# Patient Record
Sex: Male | Born: 1955 | Race: Black or African American | Hispanic: No | Marital: Single | Smoking: Never smoker
Health system: Southern US, Community
[De-identification: ages and names within clinical notes are randomized; demographics above are authoritative.]

## PROBLEM LIST (undated history)

## (undated) DIAGNOSIS — I499 Cardiac arrhythmia, unspecified: Secondary | ICD-10-CM

## (undated) DIAGNOSIS — I4892 Unspecified atrial flutter: Secondary | ICD-10-CM

## (undated) DIAGNOSIS — I519 Heart disease, unspecified: Secondary | ICD-10-CM

## (undated) DIAGNOSIS — I509 Heart failure, unspecified: Secondary | ICD-10-CM

## (undated) DIAGNOSIS — L03119 Cellulitis of unspecified part of limb: Secondary | ICD-10-CM

## (undated) DIAGNOSIS — I2699 Other pulmonary embolism without acute cor pulmonale: Secondary | ICD-10-CM

## (undated) DIAGNOSIS — I10 Essential (primary) hypertension: Secondary | ICD-10-CM

## (undated) DIAGNOSIS — F1010 Alcohol abuse, uncomplicated: Secondary | ICD-10-CM

## (undated) HISTORY — PX: TONSILLECTOMY: SUR1361

## (undated) HISTORY — DX: Heart disease, unspecified: I51.9

## (undated) HISTORY — DX: Unspecified atrial flutter: I48.92

## (undated) HISTORY — DX: Heart failure, unspecified: I50.9

## (undated) HISTORY — DX: Essential (primary) hypertension: I10

## (undated) HISTORY — DX: Other pulmonary embolism without acute cor pulmonale: I26.99

## (undated) HISTORY — DX: Alcohol abuse, uncomplicated: F10.10

## (undated) HISTORY — DX: Cellulitis of unspecified part of limb: L03.119

## (undated) HISTORY — PX: OTHER SURGICAL HISTORY: SHX169

---

## 2009-02-16 DIAGNOSIS — I2699 Other pulmonary embolism without acute cor pulmonale: Secondary | ICD-10-CM

## 2009-02-16 HISTORY — DX: Other pulmonary embolism without acute cor pulmonale: I26.99

## 2009-08-20 ENCOUNTER — Encounter (INDEPENDENT_AMBULATORY_CARE_PROVIDER_SITE_OTHER): Payer: Self-pay | Admitting: *Deleted

## 2009-08-20 ENCOUNTER — Inpatient Hospital Stay (HOSPITAL_COMMUNITY)
Admission: EM | Admit: 2009-08-20 | Discharge: 2009-08-27 | Payer: Self-pay | Source: Home / Self Care | Admitting: Emergency Medicine

## 2009-08-20 ENCOUNTER — Ambulatory Visit: Payer: Self-pay | Admitting: Cardiovascular Disease

## 2009-08-22 ENCOUNTER — Encounter (INDEPENDENT_AMBULATORY_CARE_PROVIDER_SITE_OTHER): Payer: Self-pay | Admitting: Internal Medicine

## 2009-08-26 ENCOUNTER — Encounter: Payer: Self-pay | Admitting: Cardiology

## 2009-08-26 ENCOUNTER — Encounter (INDEPENDENT_AMBULATORY_CARE_PROVIDER_SITE_OTHER): Payer: Self-pay | Admitting: *Deleted

## 2009-08-26 LAB — CONVERTED CEMR LAB
BUN: 9 mg/dL
CO2: 37 meq/L
Calcium: 8.8 mg/dL
Creatinine, Ser: 1.24 mg/dL
Eosinophils Absolute: 0.2 10*3/uL
GFR calc non Af Amer: >60 mL/min
Glucose, Bld: 124 mg/dL
Lymphocytes Relative: 40 %
Lymphs Abs: 1.6 10*3/uL
MCHC: 32.3 g/dL
Monocytes Absolute: 0.5 10*3/uL
Monocytes Relative: 11 %
Neutro Abs: 1.8 10*3/uL
Platelets: 176 10*3/uL
Potassium: 4.6 meq/L
Prothrombin Time: 22.3 s
RBC: 4.36 M/uL
Sodium: 139 meq/L

## 2009-09-04 ENCOUNTER — Ambulatory Visit: Payer: Self-pay | Admitting: Cardiology

## 2009-09-04 LAB — CONVERTED CEMR LAB: POC INR: 4

## 2009-09-11 ENCOUNTER — Ambulatory Visit: Payer: Self-pay | Admitting: Cardiology

## 2009-09-11 LAB — CONVERTED CEMR LAB: POC INR: 3.1

## 2009-09-17 ENCOUNTER — Encounter (INDEPENDENT_AMBULATORY_CARE_PROVIDER_SITE_OTHER): Payer: Self-pay | Admitting: *Deleted

## 2009-09-18 ENCOUNTER — Ambulatory Visit: Payer: Self-pay | Admitting: Cardiology

## 2009-09-27 ENCOUNTER — Ambulatory Visit: Payer: Self-pay | Admitting: Cardiology

## 2009-09-27 ENCOUNTER — Encounter (INDEPENDENT_AMBULATORY_CARE_PROVIDER_SITE_OTHER): Payer: Self-pay | Admitting: *Deleted

## 2009-09-27 DIAGNOSIS — I2699 Other pulmonary embolism without acute cor pulmonale: Secondary | ICD-10-CM

## 2009-09-27 DIAGNOSIS — F1010 Alcohol abuse, uncomplicated: Secondary | ICD-10-CM

## 2009-09-27 DIAGNOSIS — F1021 Alcohol dependence, in remission: Secondary | ICD-10-CM | POA: Insufficient documentation

## 2009-09-27 DIAGNOSIS — I4892 Unspecified atrial flutter: Secondary | ICD-10-CM | POA: Insufficient documentation

## 2009-10-29 ENCOUNTER — Encounter: Payer: Self-pay | Admitting: Cardiology

## 2009-10-29 ENCOUNTER — Encounter (INDEPENDENT_AMBULATORY_CARE_PROVIDER_SITE_OTHER): Payer: Self-pay | Admitting: *Deleted

## 2009-10-29 LAB — CONVERTED CEMR LAB
BUN: 20 mg/dL
Basophils Absolute: 0 10*3/uL
Basophils Absolute: 0 10*3/uL (ref 0.0–0.1)
Basophils Relative: 1 %
Basophils Relative: 1 % (ref 0–1)
CO2: 30 meq/L
Calcium: 9.8 mg/dL (ref 8.4–10.5)
Chloride: 100 meq/L
Chloride: 100 meq/L (ref 96–112)
Creatinine, Ser: 1.16 mg/dL (ref 0.40–1.50)
Eosinophils Absolute: 0.2 10*3/uL
Eosinophils Absolute: 0.2 10*3/uL (ref 0.0–0.7)
Eosinophils Relative: 2 % (ref 0–5)
GFR calc non Af Amer: 9.8 mL/min
Glucose, Bld: 97 mg/dL
HCT: 41.5 % (ref 39.0–52.0)
Hemoglobin: 14.2 g/dL (ref 13.0–17.0)
Lymphocytes Relative: 31 % (ref 12–46)
Lymphs Abs: 2.1 10*3/uL
MCHC: 34.2 g/dL
MCHC: 34.2 g/dL (ref 30.0–36.0)
Monocytes Absolute: 0.5 10*3/uL (ref 0.1–1.0)
Neutro Abs: 3.9 10*3/uL (ref 1.7–7.7)
Neutrophils Relative %: 58 % (ref 43–77)
Platelets: 214 10*3/uL (ref 150–400)
Potassium: 4 meq/L
Potassium: 4 meq/L (ref 3.5–5.3)
Prothrombin Time: 31.7 s
Prothrombin Time: 31.7 s — ABNORMAL HIGH (ref 11.6–15.2)
RDW: 13.9 % (ref 11.5–15.5)
aPTT: 42 s

## 2009-10-30 ENCOUNTER — Encounter (INDEPENDENT_AMBULATORY_CARE_PROVIDER_SITE_OTHER): Payer: Self-pay | Admitting: *Deleted

## 2009-10-31 ENCOUNTER — Ambulatory Visit (HOSPITAL_COMMUNITY)
Admission: RE | Admit: 2009-10-31 | Discharge: 2009-10-31 | Payer: Self-pay | Source: Home / Self Care | Admitting: Cardiology

## 2009-11-01 ENCOUNTER — Encounter: Payer: Self-pay | Admitting: Cardiology

## 2009-11-26 ENCOUNTER — Encounter: Payer: Self-pay | Admitting: Cardiology

## 2009-11-28 ENCOUNTER — Encounter (INDEPENDENT_AMBULATORY_CARE_PROVIDER_SITE_OTHER): Payer: Self-pay | Admitting: *Deleted

## 2009-12-06 ENCOUNTER — Encounter (INDEPENDENT_AMBULATORY_CARE_PROVIDER_SITE_OTHER): Payer: Self-pay | Admitting: *Deleted

## 2009-12-17 ENCOUNTER — Ambulatory Visit: Payer: Self-pay | Admitting: Cardiology

## 2009-12-17 ENCOUNTER — Encounter (INDEPENDENT_AMBULATORY_CARE_PROVIDER_SITE_OTHER): Payer: Self-pay | Admitting: *Deleted

## 2009-12-17 DIAGNOSIS — I509 Heart failure, unspecified: Secondary | ICD-10-CM | POA: Insufficient documentation

## 2009-12-18 ENCOUNTER — Encounter: Payer: Self-pay | Admitting: Cardiology

## 2010-01-27 ENCOUNTER — Telehealth (INDEPENDENT_AMBULATORY_CARE_PROVIDER_SITE_OTHER): Payer: Self-pay | Admitting: *Deleted

## 2010-03-18 NOTE — Letter (Signed)
Summary: Cardioversion/TEE Catering manager at [facility]  [address] [place] [address]   Phone: [phone]  Fax: [phone]    Cardioversion / TEE Cardioversion Instructions  10/29/2009 MRN: [id number]  Dennis Swanson [address] [place], [place]  [address]  Dear [name], You are scheduled for a Cardioversion / TEE Cardioversion on October 31, 2009 with Dr. Dietrich Pates.   Please arrive at the [facility] at 8:30 a.m.  on the day of your procedure.  1)   DIET:  A)   Nothing to eat or drink after midnight except your medications with a sip of water.     3)   MAKE SURE YOU TAKE YOUR COUMADIN.  4)   A)   DO NOT TAKE these medications before your procedure:      ___________________________________________________________________     ___________________________________________________________________     ___________________________________________________________________  B)   YOU MAY TAKE ALL of your remaining medications with a small amount of water.    C)   START NEW medications:       ___________________________________________________________________     ___________________________________________________________________  5)  Must have a responsible person to drive you home.  6)   Bring a current list of your medications and current insurance cards.   * Special Note:  Every effort is made to have your procedure done on time. Occasionally there are emergencies that present themselves at the hospital that may cause delays. Please be patient if a delay does occur.  * If you have any questions after you get home, please call the office at [phone].

## 2010-03-18 NOTE — Miscellaneous (Signed)
Summary: LABS TSH 08/20/2009  Clinical Lists Changes  Observations: Added new observation of TSH: 1.940 microintl units/mL (08/20/2009 15:57)

## 2010-03-18 NOTE — Medication Information (Signed)
Summary: ccn  Anticoagulant Therapy  Managed by: Vashti Hey, RN Supervising MD: Daleen Squibb MD, Maisie Fus Indication 1: Atrial Flutter Indication 2: Pulmonary Embolism Lab Used: [facility]  Site: [place] INR POC 4.0  Dietary changes: no    Health status changes: no    Bleeding/hemorrhagic complications: no    Recent/future hospitalizations: yes       Details: In [facility] 08/20/09 - 08/27/09  Atrial flutter / acute PE  Any changes in medication regimen? yes       Details: started on coumadin 7.5mg  qd   D/C INR 2.22 on 7/12  Recent/future dental: no  Any missed doses?: no       Is patient compliant with meds? yes      Comments: Coumadin teaching discussed with pt.  Explained risks/benefits of med., potential drug/food interactions and bleeding precautions.  Pt verbalized understanding.  H/O ETOH abuse.  Denies ETOH intake since d/c from hospital.  Question of non-compliance per D/C summary.  Anticoagulation Management History:      The patient comes in today for his initial visit for anticoagulation therapy.  The indication for anticoagulation is Atrial Flitter / acute PE.  Negative risk factors for bleeding include an age less than 18 years old, no history of CVA/TIA, no history of GI bleeding, and absence of serious comorbidities.  The bleeding index is 'low risk'.  Positive CHADS2 values include History of CHF and History of HTN.  Negative CHADS2 values include Age > 28 years old, History of Diabetes, and Prior Stroke/CVA/TIA.  The start date was 08/20/2009.  Anticoagulation responsible provider: Daleen Squibb MD, Maisie Fus.  INR POC: 4.0.  Cuvette Lot#: [id number].    Anticoagulation Management Assessment/Plan:      The target INR is 2.0-3.0.  The next INR is due 09/11/2009.  Anticoagulation instructions were given to patient.  Results were reviewed/authorized by Vashti Hey, RN.  He was notified by Vashti Hey RN.         Current Anticoagulation Instructions: INR 4.0 Hold coumadin  tonight then decrease dose to 7.5mg  once daily except 3.75mg  on Mondays and Thursdays

## 2010-03-18 NOTE — Letter (Signed)
Summary: CARDIOVERSION ORDERS  CARDIOVERSION ORDERS   Imported By: Faythe Ghee 11/26/2009 10:29:10  _____________________________________________________________________  External Attachment:    Type:   Image     Comment:   External Document

## 2010-03-18 NOTE — Letter (Signed)
Summary: Appointment - Missed  [facility]     [place], [place]    Phone:   Fax:      October 30, 2009 MRN: [id number]   Dennis Swanson [address] [place], [place]  [address]   Dear Dennis Swanson,  Our records indicate you missed your appointment on             10/30/09 COUMADIN CLINIC AND NURSE VISIT                 It is very important that we reach you to reschedule this appointment. We look forward to participating in your health care needs. Please contact us at the number listed above at your earliest convenience to reschedule this appointment.     Sincerely,    Glass blower/designer

## 2010-03-18 NOTE — Medication Information (Signed)
Summary: ccr-lr  Anticoagulant Therapy  Managed by: Vashti Hey, RN Supervising MD: Dietrich Pates MD, Molly Maduro Indication 1: Atrial Flutter Indication 2: Pulmonary Embolism Lab Used: [facility] Point of Care [place] Site: [place] INR POC 3.1  Dietary changes: no    Health status changes: no    Bleeding/hemorrhagic complications: no    Recent/future hospitalizations: no    Any changes in medication regimen? no    Recent/future dental: no  Any missed doses?: no       Is patient compliant with meds? yes       Anticoagulation Management History:      The patient is taking warfarin and comes in today for a routine follow up visit.  He is being anticoagulated due to Atrial Flitter / acute PE.  Negative risk factors for bleeding include an age less than 35 years old, no history of CVA/TIA, no history of GI bleeding, and absence of serious comorbidities.  The bleeding index is 'low risk'.  Positive CHADS2 values include History of CHF and History of HTN.  Negative CHADS2 values include Age > 11 years old, History of Diabetes, and Prior Stroke/CVA/TIA.  The start date was 08/20/2009.  Anticoagulation responsible provider: Dietrich Pates MD, Molly Maduro.  INR POC: 3.1.  Cuvette Lot#: [id number].    Anticoagulation Management Assessment/Plan:      The target INR is 2.0-3.0.  The next INR is due 09/18/2009.  Anticoagulation instructions were given to patient.  Results were reviewed/authorized by Vashti Hey, RN.  He was notified by Vashti Hey RN.         Prior Anticoagulation Instructions: INR 4.0 Hold coumadin tonight then decrease dose to 7.5mg  once daily except 3.75mg  on Mondays and Thursdays  Current Anticoagulation Instructions: INR 3.1 Continue coumadin 7.5mg  once daily except 3.75mg  on Mondays and Thursdays Has appt with Dr Eden Emms 09/25/09

## 2010-03-18 NOTE — Letter (Signed)
Summary: [facility]   Imported By: Marylou Mccoy 09/10/2009 11:43:36  _____________________________________________________________________  External Attachment:    Type:   Image     Comment:   External Document

## 2010-03-18 NOTE — Letter (Signed)
Summary: [place] Future Lab Work Engineer, agricultural at [employer]  [address], [place] [address]   Phone: [phone]  Fax: [phone]     December 17, 2009 MRN: [id number]   Dennis Swanson [address] [place], [place]  [address]      YOUR LAB WORK IS DUE   January 07, 2010  Please go to [facility], located across the street from [facility] on the second floor.  Hours are Monday - Friday 7am until 7:30pm         Saturday 8am until 12noon    _  __ YOUR LABWORK IS NOT FASTING --YOU MAY EAT PRIOR TO LABWORK

## 2010-03-18 NOTE — Miscellaneous (Signed)
Summary: labs cbcd,bmp,pt,ptt,10/29/2009  Clinical Lists Changes  Observations: Added new observation of GFR: 9.8 mL/min (10/29/2009 10:26) Added new observation of CREATININE: 1.16 mg/dL (29/52/8413 24:40) Added new observation of BUN: 20 mg/dL (12/13/2534 64:40) Added new observation of BG RANDOM: 97 mg/dL (34/74/2595 63:87) Added new observation of CO2 PLSM/SER: 30 meq/L (10/29/2009 10:26) Added new observation of CL SERUM: 100 meq/L (10/29/2009 10:26) Added new observation of K SERUM: 4.0 meq/L (10/29/2009 10:26) Added new observation of NA: 138 meq/L (10/29/2009 10:26) Added new observation of INR: 3.06  (10/29/2009 10:26) Added new observation of PT PATIENT: 31.7 s (10/29/2009 10:26) Added new observation of PTT PATIENT: 42 s (10/29/2009 10:26) Added new observation of ABSOLUTE BAS: 0.0 K/uL (10/29/2009 10:26) Added new observation of BASOPHIL %: 1 % (10/29/2009 10:26) Added new observation of EOS ABSLT: 0.2 K/uL (10/29/2009 10:26) Added new observation of % EOS AUTO: 2 % (10/29/2009 10:26) Added new observation of ABSOLUTE MON: 0.5 K/uL (10/29/2009 10:26) Added new observation of MONOCYTE %: 8 % (10/29/2009 10:26) Added new observation of ABS LYMPHOCY: 2.1 K/uL (10/29/2009 10:26) Added new observation of LYMPHS %: 31 % (10/29/2009 10:26) Added new observation of PLATELETK/UL: 214 K/uL (10/29/2009 10:26) Added new observation of RDW: 13.9 % (10/29/2009 10:26) Added new observation of MCHC RBC: 34.2 g/dL (56/43/3295 18:84) Added new observation of MCV: 88.1 fL (10/29/2009 10:26) Added new observation of HCT: 41.5 % (10/29/2009 10:26) Added new observation of HGB: 14.2 g/dL (16/60/6301 60:10) Added new observation of RBC M/UL: 4.71 M/uL (10/29/2009 10:26) Added new observation of WBC COUNT: 6.6 10*3/microliter (10/29/2009 10:26)

## 2010-03-18 NOTE — Letter (Signed)
Summary: TEE Instructions [place]  [facility]  [address], [place] [address]   Phone: [phone]  Fax: [phone]      TEE Instructions    You are scheduled for a CARDIOVERSION on October 29, 2009 with Dr. Dietrich Pates.  Please arrive at the [facility] of [facility] at 8:00 a.m. on the day of your procedure.  1)   Diet:     A)   Nothing to eat or drink after midnight except your medications with        a sip of water.     2)  Must have a responsible person to drive you home.  3)   Bring your current insurance cards and current list of all your medications.   *Special Note:  Every effort is made to have your procedure done on time.  Occasionally there are emergencies that present themselves at the hospital that may cause delays.  Please be patient if a delay does occur.  *If you have any questions after you get home, please call the office at [phone].

## 2010-03-18 NOTE — Letter (Signed)
Summary: Appointment - Missed  [facility]  [address] [place] [address]   Phone: [phone]  Fax: [phone]     November 28, 2009 MRN: [id number]   BARNES FLOREK [address] [place], [place]  [address]   Dear Mr. NIPP,  Our records indicate you missed your appointment on         11/28/09               with Dr.     Dietrich Pates  .                                    It is very important that we reach you to reschedule this appointment. We look forward to participating in your health care needs. Please contact us at the number listed above at your earliest convenience to reschedule this appointment.     Sincerely,    Glass blower/designer

## 2010-03-18 NOTE — Assessment & Plan Note (Signed)
Summary: EPH RESCEDULED PER RR 09/23/2009/SN   Visit Type:  Follow-up Primary Provider:  None   History of Present Illness: [name] returns to the office after a recent admission to [facility] for newly diagnosed atrial flutter accompanied by congestive heart failure and mildly impaired left ventricular systolic function.  He responded well to diuretic therapy with weight decreasing from 122 to 110 kg.  Since discharge, he has been asymptomatic with no exertional dyspnea, orthopnea, PND, lightheadedness, syncope or chest pain.  Current Medications (verified): 1)  Metoprolol Succinate 100 Mg Xr24h-Tab (Metoprolol Succinate) .... Take 1/2 Tablet By Mouth Qd  X 3 Days Then Take 1 Tablet By Mouth Once Daily 2)  Diltiazem Hcl Er Beads 180 Mg Xr24h-Cap (Diltiazem Hcl Er Beads) .... Take 1 Tab Daily 3)  Furosemide 40 Mg Tabs (Furosemide) .... Take 1 Tablet By Mouth Once Daily 4)  Potassium Chloride Crys Cr 20 Meq Cr-Tabs (Potassium Chloride Crys Cr) .... Take 1 Tablet By Mouth Once Daily 5)  Warfarin Sodium 7.5 Mg Tabs (Warfarin Sodium) .... Take  As Directed  Allergies (verified): No Known Drug Allergies  Past History:  Past Medical History: Last updated: 09/27/2009 Atrial flutter;  CHF with mild systolic dysfunction; moderate TR + MR; mod. to marked LAE; no significant PHT Acute pulmonary embolus-7/11; anticoagulation Hypertension Alcohol abuse Bilateral lower extremity cellulitis     Past Surgical History: Last updated: 09/27/2009 None  Family History: Last updated: September 21, 2009 Mother:deceased due to myocardial infarction in her 73's Siblings:1 brother with diabetes and hypertension  Social History: Last updated: 09/27/2009 Tobacco Use - No.  Alcohol Use - yes-patient will not quantify Regular Exercise - no Drug Use - no Employment-unemployed; previously yard work and Psychologist, clinical alone; 2 daughters  PMH, FH, and Social History reviewed  and updated.  EKG  Procedure date:  09/27/2009  Findings:      Atrial flutter with 4:1 AV conduction Left anterior fascicular block Right ventricular conduction delay No previous tracing for comparison.   Social History: Tobacco Use - No.  Alcohol Use - yes-patient will not quantify Regular Exercise - no Drug Use - no Employment-unemployed; previously yard work and Psychologist, clinical alone; 2 daughters  Review of Systems       See history of present illness.  Vital Signs:  Patient profile:   55 year old male Height:      72 inches Weight:      255 pounds BMI:     34.71 Pulse rate:   72 / minute BP sitting:   130 / 87  (right arm)  Vitals Entered By: Dreama Saa, CNA (September 27, 2009 1:00 PM)  Physical Exam  General:  Overweight; well developed; no acute distress: Weight-255 (116 kg), 12 pounds more than when discharged from hospital   Neck-No JVD; no carotid bruits: Lungs-No tachypnea, no rales; no rhonchi; no wheezes: Cardiovascular-normal PMI; normal S1 and S2: Abdomen-BS normal; soft and non-tender without masses or organomegaly:  Musculoskeletal-No deformities, no cyanosis or clubbing: Neurologic-Normal cranial nerves; symmetric strength and tone:  Skin-Warm, no significant lesions: Extremities-Nl distal pulses; trace edema:     Impression & Recommendations:  Problem # 1:  ATRIAL FLUTTER (ICD-427.32) Presence of atrial flutter could be related to hypertension, pulmonary embolism or alcohol abuse.  Cardioversion may result in restoration of durable sinus rhythm.  Procedure was explained to patient including the risks of sedation.  He agrees to proceed, and we will do so as soon as therapeutic anticoagulation  has been maintained for 3 weeks.  Problem # 2:  PULMONARY EMBOLISM (ICD-415.19) This was an unprovoked event as far as hospital records indicate, and will require long-term anticoagulation.  Problem # 3:  ANTICOAGULATION  (ICD-V58.61) Stool for Hemoccult testing and CBCs will be monitored.  Problem # 4:  HYPERTENSION (ICD-401.1) Blood pressure control is excellent; current medications will be continued.  Problem # 5:  ALCOHOL ABUSE (ICD-305.00) Patient is not convinced that his consumption of alcohol is excessive.  He attributes his DUI arrests to animosity between the arresting officers and him.  I encouraged him to let me know if and when he does consider alcohol to be a problem for him.  Return visit-2 months.  Other Orders: Cardioversion (Cardioversion) Future Orders: T-Basic Metabolic Panel [id number]) ... 10/28/2009 T-CBC w/Diff ([id number]) ... 10/28/2009 T-Protime, Auto ([id number]) ... 10/28/2009 T-PTT ([id number]) ... 10/28/2009  Patient Instructions: 1)  Your physician recommends that you schedule a follow-up appointment in: 2 months 2)  Your physician recommends that you return for lab work in: 1 month 3)  Your physician has recommended you make the following change in your medication: Change Coreg (carvedilol) to Metoprolol 100mg   (take 1/2 tablet everyday for 3 days then take 1 tablet by mouth once daily), stop taking Digoxin, decrease Lasix (furosemide) to 40mg  once daily and Potassium to 40mg  once daily  Prescriptions: METOPROLOL SUCCINATE 100 MG XR24H-TAB (METOPROLOL SUCCINATE) take 1/2 tablet by mouth qd  X 3 days then take 1 tablet by mouth once daily  #30 x 3   Entered by:   Larita Fife Via LPN   Authorized by:   Kathlen Brunswick, MD, [facility]   Signed by:   Larita Fife Via LPN on 44/02/270   Method used:   Electronically to        [facility] (retail)       [address]       [place], [place]  [address]       Ph: [phone]       Fax: [phone]   RxID:   [id number]

## 2010-03-18 NOTE — Letter (Signed)
Summary: [place] Results Engineer, agricultural at [facility]  [address], [place] [address]   Phone: [phone]  Fax: [phone]      November 01, 2009 MRN: [id number]   KNOLAN SIMIEN [address] [place], [place]  [address]   Dear Mr. SWIATEK,  Your test ordered by Selena Batten has been reviewed by your physician (or physician assistant) and was found to be normal or stable. Your physician (or physician assistant) felt no changes were needed at this time.  ____ Echocardiogram  ____ Cardiac Stress Test  __X__ Lab Work  ____ Peripheral vascular study of arms, legs or neck  ____ CT scan or X-ray  ____ Lung or Breathing test  ____ Other:  Please continue on current medical treatment.  Thank you.   Neilton Bing, MD, F.A.C.C

## 2010-03-18 NOTE — Miscellaneous (Signed)
Summary: hospital labs 08/26/2009  Clinical Lists Changes  Observations: Added new observation of CALCIUM: 8.8 mg/dL (02/58/5277 8:24) Added new observation of GFR AA: >60 mL/min/1.28m2 (08/26/2009 8:18) Added new observation of GFR: >60 mL/min (08/26/2009 8:18) Added new observation of CREATININE: 1.24 mg/dL (23/53/6144 3:15) Added new observation of BUN: 9 mg/dL (40/09/6759 9:50) Added new observation of BG RANDOM: 124 mg/dL (93/26/7124 5:80) Added new observation of CO2 PLSM/SER: 37 meq/L (08/26/2009 8:18) Added new observation of CL SERUM: 96 meq/L (08/26/2009 8:18) Added new observation of K SERUM: 4.6 meq/L (08/26/2009 8:18) Added new observation of NA: 139 meq/L (08/26/2009 8:18) Added new observation of INR: 1.92  (08/26/2009 8:18) Added new observation of PT PATIENT: 22.3 s (08/26/2009 8:18) Added new observation of ABSOLUTE BAS: 0.0 K/uL (08/26/2009 8:18) Added new observation of BASOPHIL %: 1 % (08/26/2009 8:18) Added new observation of EOS ABSLT: 0.2 K/uL (08/26/2009 8:18) Added new observation of % EOS AUTO: 0.2 % (08/26/2009 8:18) Added new observation of ABSOLUTE MON: 0.5 K/uL (08/26/2009 8:18) Added new observation of MONOCYTE %: 11 % (08/26/2009 8:18) Added new observation of ABS LYMPHOCY: 1.6 K/uL (08/26/2009 8:18) Added new observation of LYMPHS %: 40 % (08/26/2009 8:18) Added new observation of ABS NEUTROPH: 1.8 K/uL (08/26/2009 8:18) Added new observation of PLATELETK/UL: 176 K/uL (08/26/2009 8:18) Added new observation of RDW: 14.3 % (08/26/2009 8:18) Added new observation of MCHC RBC: 32.3 g/dL (99/83/3825 0:53) Added new observation of MCV: 95.2 fL (08/26/2009 8:18) Added new observation of HCT: 41.1 % (08/26/2009 8:18) Added new observation of HGB: 13.4 g/dL (97/67/3419 3:79) Added new observation of RBC M/UL: 4.36 M/uL (08/26/2009 8:18) Added new observation of WBC COUNT: 4.1 10*3/microliter (08/26/2009 8:18)

## 2010-03-18 NOTE — Medication Information (Signed)
Summary: ccr-lr  Anticoagulant Therapy  Managed by: Vashti Hey, RN Supervising MD: Diona Browner MD, Remi Deter Indication 1: Atrial Flutter Indication 2: Pulmonary Embolism Lab Used: [facility] Point of Care [place] Site: [place] INR POC 1.8  Dietary changes: no    Health status changes: no    Bleeding/hemorrhagic complications: no    Recent/future hospitalizations: no    Any changes in medication regimen? no    Recent/future dental: no  Any missed doses?: yes     Details: missed 1 dose last night.  Ran out of med  Getting refilled today  Is patient compliant with meds? yes       Allergies: No Known Drug Allergies  Anticoagulation Management History:      The patient is taking warfarin and comes in today for a routine follow up visit.  He is being anticoagulated due to Atrial Flitter / acute PE.  Negative risk factors for bleeding include an age less than 36 years old, no history of CVA/TIA, no history of GI bleeding, and absence of serious comorbidities.  The bleeding index is 'low risk'.  Positive CHADS2 values include History of CHF and History of HTN.  Negative CHADS2 values include Age > 26 years old, History of Diabetes, and Prior Stroke/CVA/TIA.  The start date was 08/20/2009.  Anticoagulation responsible provider: Diona Browner MD, Remi Deter.  INR POC: 1.8.  Cuvette Lot#: [id number].    Anticoagulation Management Assessment/Plan:      The patient's current anticoagulation dose is Warfarin sodium 7.5 mg tabs: take  as directed.  The target INR is 2.0-3.0.  The next INR is due 10/07/2009.  Anticoagulation instructions were given to patient.  Results were reviewed/authorized by Vashti Hey, RN.  He was notified by Vashti Hey RN.         Prior Anticoagulation Instructions: INR 3.1 Continue coumadin 7.5mg  once daily except 3.75mg  on Mondays and Thursdays Has appt with Dr Eden Emms 09/25/09  Current Anticoagulation Instructions: INR 1.8 Take coumadin 1 1/2 tablets tonight then resume 7.5mg   once daily except 3.75mg  on Mondays and Thursdays

## 2010-03-18 NOTE — Assessment & Plan Note (Signed)
Summary: PAST DUE FOR F/U/TG   Visit Type:  Follow-up Primary Provider:  None   History of Present Illness: [name] returns to the office for continuing assessment and treatment of atrial flutter associated with congestive heart failure and mildly impaired left ventricular systolic function.  Since his last visit, he has done quite well.  He denies orthopnea, PND, exertional dyspnea, chest discomfort, palpitations, lightheadedness and syncope.  Cardioversion was planned, but never carried out as the result of the patient withdrawing consent.  After discussion with another physician, he was concerned about possible adverse effects.  EKG  Procedure date:  12/17/2009  Findings:      Rhythm Strip  Normal sinus rhythm at a rate of 64 bpm   Current Medications (verified): 1)  Metoprolol Succinate 100 Mg Xr24h-Tab (Metoprolol Succinate) .... Take 1 Tablet By Mouth Once A Day 2)  Diltiazem Hcl Er Beads 180 Mg Xr24h-Cap (Diltiazem Hcl Er Beads) .... Take 1 Tab Daily 3)  Furosemide 40 Mg Tabs (Furosemide) .... Take 1/2 Tablet By Mouth Daily 4)  Potassium Chloride Crys Cr 20 Meq Cr-Tabs (Potassium Chloride Crys Cr) .... Take 1 Tablet By Mouth Once Daily 5)  Warfarin Sodium 7.5 Mg Tabs (Warfarin Sodium) .... Take  As Directed  Allergies (verified): No Known Drug Allergies  Comments:  Nurse/Medical Assistant: patient reviewed previous med list from previous ov and stated that all meds are correct  Past History:  PMH, FH, and Social History reviewed and updated.  Review of Systems       See history of present illness.  Vital Signs:  Patient profile:   55 year old male Weight:      257 pounds BMI:     34.98 Pulse rate:   62 / minute BP sitting:   141 / 91  (right arm)  Vitals Entered By: Dreama Saa, CNA (December 17, 2009 1:49 PM)  Physical Exam  General:  Overweight; well developed; no acute distress: Weight-255 (116 kg), 12 pounds more than when discharged  from hospital   Neck-No JVD; no carotid bruits: Lungs-No tachypnea, no rales; no rhonchi; no wheezes: Cardiovascular-normal PMI; normal S1 and S2; minimal systolic murmur Abdomen-BS normal; soft and non-tender without masses or organomegaly:  Musculoskeletal-No deformities, no cyanosis or clubbing: Neurologic-Normal cranial nerves; symmetric strength and tone:  Skin-Warm, no significant lesions: Extremities-Nl distal pulses;1/2+ edema of the right ankle; minimal on the left   Impression & Recommendations:  Problem # 1:  ATRIAL FLUTTER (ICD-427.32) Patient appears to make the correct decision regarding cardioversion, as he has converted spontaneously to sinus rhythm.  Reversion to atrial flutter appears likely since there was no precipitant for his first episode.  Current medication will be continued.  Problem # 2:  CHF (ICD-428.0) CHF may be more related to patient's rhythm then his intrinsic cardiac function.  His dose of furosemide will be decreased to 20 mg q.d. with continuing monitoring of electrolytes and renal function.  Problem # 3:  ANTICOAGULATION (ICD-V58.61) Stool for Hemoccult testing will be obtained.  CBC will be monitored to exclude significant blood loss while the patient is anticoagulated.  Other Orders: Hemoccult Cards (Take Home) (Hemoccult Cards) Future Orders: T-BNP  (B Natriuretic Peptide) ([id number]) ... 01/07/2010 T-CBC w/Diff ([id number]) ... 01/07/2010 T-Comprehensive Metabolic Panel [id number]) ... 01/07/2010  Patient Instructions: 1)  Your physician recommends that you schedule a follow-up appointment in: 6 months 2)  Your physician recommends that you return for lab work in:  3 weeks 3)  Your  physician has asked that you test your stool for blood. It is necessary to test 3 different stool specimens for accuracy. You will be given 3 hemoccult cards for specimen collection. For each stool specimen, place a small portion of stool sample (from 2  different areas of the stool) into the 2 squares on the card. Close card. Repeat with 2 more stool specimens. Bring the cards back to the office for testing. 4)  INCREASE ACTIVITY

## 2010-03-20 NOTE — Progress Notes (Signed)
  Phone Note Other Incoming   Request: Send information Summary of Call: Request for records received from [facility] and [employer]. Request forwarded to [facility].

## 2010-03-28 ENCOUNTER — Encounter (INDEPENDENT_AMBULATORY_CARE_PROVIDER_SITE_OTHER): Payer: Self-pay | Admitting: Pharmacist

## 2010-04-03 NOTE — Letter (Signed)
Summary: Custom - Delinquent Coumadin 1  [facility], Main Office  [address]   [place], [place] [address]   Phone: [phone]  Fax: [phone]     March 28, 2010 MRN: [id number]   Dennis Swanson [address] [place], [place]  [address]   Dear Dennis Swanson,  This letter is being sent to you as a reminder that it is necessary for you to get your INR/PT checked regularly so that we can optimize your care.  Our records indicate that you were scheduled to have a test done recently.  As of today, we have not received the results of this test.  It is very important that you have your INR checked.  Please call our office at the number listed above to schedule an appointment at your earliest convenience.    If you have recently had your protime checked or have discontinued this medication, please contact our office at the above phone number to clarify this issue.  Thank you for this prompt attention to this important health care matter.  Sincerely,   [facility] Team

## 2010-04-30 ENCOUNTER — Encounter: Payer: Self-pay | Admitting: Pharmacist

## 2010-05-04 LAB — CBC
HCT: 38.5 % — ABNORMAL LOW (ref 39.0–52.0)
HCT: 38.7 % — ABNORMAL LOW (ref 39.0–52.0)
Hemoglobin: 12.9 g/dL — ABNORMAL LOW (ref 13.0–17.0)
Hemoglobin: 12.9 g/dL — ABNORMAL LOW (ref 13.0–17.0)
MCH: 30.8 pg (ref 26.0–34.0)
MCH: 31.2 pg (ref 26.0–34.0)
MCH: 31.4 pg (ref 26.0–34.0)
MCHC: 32.3 g/dL (ref 30.0–36.0)
MCHC: 32.5 g/dL (ref 30.0–36.0)
MCHC: 32.6 g/dL (ref 30.0–36.0)
MCHC: 32.7 g/dL (ref 30.0–36.0)
MCHC: 33.3 g/dL (ref 30.0–36.0)
MCV: 94.8 fL (ref 78.0–100.0)
MCV: 95.3 fL (ref 78.0–100.0)
Platelets: 152 10*3/uL (ref 150–400)
Platelets: 164 10*3/uL (ref 150–400)
Platelets: 166 10*3/uL (ref 150–400)
Platelets: 176 10*3/uL (ref 150–400)
Platelets: 181 10*3/uL (ref 150–400)
RBC: 4.1 MIL/uL — ABNORMAL LOW (ref 4.22–5.81)
RBC: 4.35 MIL/uL (ref 4.22–5.81)
RDW: 14.4 % (ref 11.5–15.5)
RDW: 14.5 % (ref 11.5–15.5)
RDW: 14.6 % (ref 11.5–15.5)
RDW: 14.7 % (ref 11.5–15.5)
RDW: 15 % (ref 11.5–15.5)
WBC: 3.7 10*3/uL — ABNORMAL LOW (ref 4.0–10.5)
WBC: 3.8 10*3/uL — ABNORMAL LOW (ref 4.0–10.5)
WBC: 4.3 10*3/uL (ref 4.0–10.5)
WBC: 5.1 10*3/uL (ref 4.0–10.5)

## 2010-05-04 LAB — DIFFERENTIAL
Basophils Absolute: 0 10*3/uL (ref 0.0–0.1)
Basophils Absolute: 0 10*3/uL (ref 0.0–0.1)
Basophils Absolute: 0 10*3/uL (ref 0.0–0.1)
Basophils Absolute: 0.1 10*3/uL (ref 0.0–0.1)
Basophils Relative: 1 % (ref 0–1)
Basophils Relative: 1 % (ref 0–1)
Basophils Relative: 1 % (ref 0–1)
Basophils Relative: 1 % (ref 0–1)
Eosinophils Absolute: 0.1 10*3/uL (ref 0.0–0.7)
Eosinophils Absolute: 0.2 10*3/uL (ref 0.0–0.7)
Eosinophils Absolute: 0.2 10*3/uL (ref 0.0–0.7)
Eosinophils Relative: 7 % — ABNORMAL HIGH (ref 0–5)
Lymphocytes Relative: 15 % (ref 12–46)
Lymphocytes Relative: 21 % (ref 12–46)
Lymphs Abs: 0.8 10*3/uL (ref 0.7–4.0)
Lymphs Abs: 1.5 10*3/uL (ref 0.7–4.0)
Lymphs Abs: 1.6 10*3/uL (ref 0.7–4.0)
Monocytes Absolute: 0.8 10*3/uL (ref 0.1–1.0)
Monocytes Absolute: 0.8 10*3/uL (ref 0.1–1.0)
Monocytes Absolute: 0.8 10*3/uL (ref 0.1–1.0)
Monocytes Relative: 11 % (ref 3–12)
Monocytes Relative: 17 % — ABNORMAL HIGH (ref 3–12)
Neutro Abs: 1.5 10*3/uL — ABNORMAL LOW (ref 1.7–7.7)
Neutro Abs: 1.9 10*3/uL (ref 1.7–7.7)
Neutro Abs: 2.7 10*3/uL (ref 1.7–7.7)
Neutrophils Relative %: 31 % — ABNORMAL LOW (ref 43–77)
Neutrophils Relative %: 36 % — ABNORMAL LOW (ref 43–77)
Neutrophils Relative %: 43 % (ref 43–77)
Neutrophils Relative %: 60 % (ref 43–77)

## 2010-05-04 LAB — POCT CARDIAC MARKERS
CKMB, poc: 1.6 ng/mL (ref 1.0–8.0)
Troponin i, poc: <0.05 ng/mL (ref 0.00–0.09)

## 2010-05-04 LAB — BRAIN NATRIURETIC PEPTIDE
Pro B Natriuretic peptide (BNP): 243 pg/mL — ABNORMAL HIGH (ref 0.0–100.0)
Pro B Natriuretic peptide (BNP): 273 pg/mL — ABNORMAL HIGH (ref 0.0–100.0)
Pro B Natriuretic peptide (BNP): 436 pg/mL — ABNORMAL HIGH (ref 0.0–100.0)

## 2010-05-04 LAB — BASIC METABOLIC PANEL
BUN: 10 mg/dL (ref 6–23)
BUN: 6 mg/dL (ref 6–23)
CO2: 29 mEq/L (ref 19–32)
CO2: 37 mEq/L — ABNORMAL HIGH (ref 19–32)
Calcium: 8.5 mg/dL (ref 8.4–10.5)
Calcium: 8.7 mg/dL (ref 8.4–10.5)
Calcium: 8.8 mg/dL (ref 8.4–10.5)
Calcium: 8.8 mg/dL (ref 8.4–10.5)
Creatinine, Ser: 1.04 mg/dL (ref 0.4–1.5)
Creatinine, Ser: 1.21 mg/dL (ref 0.4–1.5)
Creatinine, Ser: 1.24 mg/dL (ref 0.4–1.5)
Creatinine, Ser: 1.28 mg/dL (ref 0.4–1.5)
GFR calc Af Amer: >60 mL/min (ref >60)
GFR calc Af Amer: >60 mL/min (ref >60)
GFR calc non Af Amer: 51 mL/min — ABNORMAL LOW (ref >60)
GFR calc non Af Amer: 58 mL/min — ABNORMAL LOW (ref >60)
GFR calc non Af Amer: >60 mL/min (ref >60)
GFR calc non Af Amer: >60 mL/min (ref >60)
GFR calc non Af Amer: >60 mL/min (ref >60)
Glucose, Bld: 106 mg/dL — ABNORMAL HIGH (ref 70–99)
Glucose, Bld: 109 mg/dL — ABNORMAL HIGH (ref 70–99)
Glucose, Bld: 114 mg/dL — ABNORMAL HIGH (ref 70–99)
Glucose, Bld: 118 mg/dL — ABNORMAL HIGH (ref 70–99)
Potassium: 3.8 mEq/L (ref 3.5–5.1)
Sodium: 140 mEq/L (ref 135–145)
Sodium: 140 mEq/L (ref 135–145)

## 2010-05-04 LAB — HEPATIC FUNCTION PANEL
ALT: 40 U/L (ref 0–53)
AST: 33 U/L (ref 0–37)
Bilirubin, Direct: 0.2 mg/dL (ref 0.0–0.3)
Total Protein: 6.4 g/dL (ref 6.0–8.3)

## 2010-05-04 LAB — PROTIME-INR
INR: 1.18 (ref 0.00–1.49)
INR: 1.2 (ref 0.00–1.49)
INR: 1.92 — ABNORMAL HIGH (ref 0.00–1.49)
INR: 2.03 — ABNORMAL HIGH (ref 0.00–1.49)
INR: 2.09 — ABNORMAL HIGH (ref 0.00–1.49)
Prothrombin Time: 15.5 seconds — ABNORMAL HIGH (ref 11.6–15.2)
Prothrombin Time: 18.1 seconds — ABNORMAL HIGH (ref 11.6–15.2)
Prothrombin Time: 22.3 seconds — ABNORMAL HIGH (ref 11.6–15.2)
Prothrombin Time: 23.3 seconds — ABNORMAL HIGH (ref 11.6–15.2)
Prothrombin Time: 23.8 seconds — ABNORMAL HIGH (ref 11.6–15.2)

## 2010-05-04 LAB — URINALYSIS, ROUTINE W REFLEX MICROSCOPIC
Bilirubin Urine: NEGATIVE
Glucose, UA: NEGATIVE mg/dL
Ketones, ur: NEGATIVE mg/dL
Protein, ur: NEGATIVE mg/dL
Urobilinogen, UA: 0.2 mg/dL (ref 0.0–1.0)
pH: 5.5 (ref 5.0–8.0)

## 2010-05-04 LAB — LIPID PANEL
HDL: 31 mg/dL — ABNORMAL LOW (ref >39)
Triglycerides: 66 mg/dL (ref <150)

## 2010-05-04 LAB — TSH: TSH: 1.94 u[IU]/mL (ref 0.350–4.500)

## 2010-05-04 LAB — RAPID URINE DRUG SCREEN, HOSP PERFORMED
Amphetamines: NOT DETECTED
Benzodiazepines: NOT DETECTED
Tetrahydrocannabinol: NOT DETECTED

## 2010-05-04 LAB — CK TOTAL AND CKMB (NOT AT ARMC): Total CK: 176 U/L (ref 7–232)

## 2010-05-04 LAB — APTT: aPTT: 28 seconds (ref 24–37)

## 2010-05-06 ENCOUNTER — Encounter: Payer: Self-pay | Admitting: *Deleted

## 2010-05-06 NOTE — Letter (Signed)
Summary: Custom - Delinquent Coumadin 2  [facility]  [address] [place] [address]   Phone: [phone]  Fax: [phone]     April 30, 2010 MRN: [id number]   Dennis Swanson [address] [place], [place]  [address]   Dear [name],  We have attempted to contact you by phone and letter on multiple occasions to contact our office for important blood work associated with the blood thinner, warfarin (Coumadin).  Warfarin is a very important drug that can cause life threatening side effects including, bleeding, and thus requires close laboratory monitoring.  We are unable to accept responsibility for blood thinner-related health problems you may develop because you have not followed our recommendations for appropriate monitoring.  These may include abnormal bleeding occurrences and/or development of blood clots (stroke, heart attack, blood clots in legs or lungs, etc.).  We need for you to contact this office at the number listed above to schedule and complete this very important blood work.  Thank you for your assistance in this urgent matter.  Sincerely, Vashti Hey RN [facility] Team

## 2010-05-13 ENCOUNTER — Encounter: Payer: Self-pay | Admitting: Cardiology

## 2010-05-13 DIAGNOSIS — I2699 Other pulmonary embolism without acute cor pulmonale: Secondary | ICD-10-CM

## 2010-05-13 DIAGNOSIS — Z7901 Long term (current) use of anticoagulants: Secondary | ICD-10-CM

## 2010-05-13 DIAGNOSIS — I4892 Unspecified atrial flutter: Secondary | ICD-10-CM

## 2010-05-14 ENCOUNTER — Ambulatory Visit (INDEPENDENT_AMBULATORY_CARE_PROVIDER_SITE_OTHER): Payer: Self-pay | Admitting: *Deleted

## 2010-05-14 DIAGNOSIS — I2699 Other pulmonary embolism without acute cor pulmonale: Secondary | ICD-10-CM

## 2010-05-14 DIAGNOSIS — I4892 Unspecified atrial flutter: Secondary | ICD-10-CM

## 2010-05-14 DIAGNOSIS — Z7901 Long term (current) use of anticoagulants: Secondary | ICD-10-CM

## 2010-06-11 ENCOUNTER — Ambulatory Visit (INDEPENDENT_AMBULATORY_CARE_PROVIDER_SITE_OTHER): Payer: Self-pay | Admitting: *Deleted

## 2010-06-11 DIAGNOSIS — I4892 Unspecified atrial flutter: Secondary | ICD-10-CM

## 2010-06-11 DIAGNOSIS — Z7901 Long term (current) use of anticoagulants: Secondary | ICD-10-CM

## 2010-06-11 DIAGNOSIS — I2699 Other pulmonary embolism without acute cor pulmonale: Secondary | ICD-10-CM

## 2010-07-09 ENCOUNTER — Ambulatory Visit (INDEPENDENT_AMBULATORY_CARE_PROVIDER_SITE_OTHER): Payer: Self-pay | Admitting: *Deleted

## 2010-07-09 DIAGNOSIS — I4892 Unspecified atrial flutter: Secondary | ICD-10-CM

## 2010-07-09 DIAGNOSIS — I2699 Other pulmonary embolism without acute cor pulmonale: Secondary | ICD-10-CM

## 2010-07-09 DIAGNOSIS — Z7901 Long term (current) use of anticoagulants: Secondary | ICD-10-CM

## 2010-07-28 ENCOUNTER — Ambulatory Visit (INDEPENDENT_AMBULATORY_CARE_PROVIDER_SITE_OTHER): Payer: Medicaid Other | Admitting: *Deleted

## 2010-07-28 DIAGNOSIS — Z7901 Long term (current) use of anticoagulants: Secondary | ICD-10-CM

## 2010-07-28 DIAGNOSIS — I2699 Other pulmonary embolism without acute cor pulmonale: Secondary | ICD-10-CM

## 2010-07-28 DIAGNOSIS — I4892 Unspecified atrial flutter: Secondary | ICD-10-CM

## 2010-08-18 ENCOUNTER — Ambulatory Visit (INDEPENDENT_AMBULATORY_CARE_PROVIDER_SITE_OTHER): Payer: Medicaid Other | Admitting: *Deleted

## 2010-08-18 DIAGNOSIS — I4892 Unspecified atrial flutter: Secondary | ICD-10-CM

## 2010-08-18 DIAGNOSIS — I2699 Other pulmonary embolism without acute cor pulmonale: Secondary | ICD-10-CM

## 2010-08-18 DIAGNOSIS — Z7901 Long term (current) use of anticoagulants: Secondary | ICD-10-CM

## 2010-08-18 LAB — POCT INR: INR: 3.4

## 2010-09-15 ENCOUNTER — Ambulatory Visit (INDEPENDENT_AMBULATORY_CARE_PROVIDER_SITE_OTHER): Payer: Medicaid Other | Admitting: *Deleted

## 2010-09-15 DIAGNOSIS — Z7901 Long term (current) use of anticoagulants: Secondary | ICD-10-CM

## 2010-09-15 DIAGNOSIS — I2699 Other pulmonary embolism without acute cor pulmonale: Secondary | ICD-10-CM

## 2010-09-15 DIAGNOSIS — I4892 Unspecified atrial flutter: Secondary | ICD-10-CM

## 2010-10-01 ENCOUNTER — Encounter: Payer: Medicaid Other | Admitting: *Deleted

## 2010-12-29 ENCOUNTER — Telehealth: Payer: Self-pay | Admitting: *Deleted

## 2010-12-29 ENCOUNTER — Encounter: Payer: Self-pay | Admitting: *Deleted

## 2010-12-29 NOTE — Telephone Encounter (Signed)
SENT LETTER NO ANSWER BY PHONE/TMJ

## 2011-02-16 ENCOUNTER — Encounter: Payer: Self-pay | Admitting: Cardiology

## 2011-12-09 ENCOUNTER — Encounter: Payer: Self-pay | Admitting: *Deleted

## 2012-03-22 ENCOUNTER — Telehealth: Payer: Self-pay

## 2012-03-22 NOTE — Telephone Encounter (Signed)
Pt was referred by Ferdie Ping, PA for screening colonoscopy. ( * See note that said coumadin letter was returned). I called the phone number on the referral of [phone] and was told the pt does not live there anymore.  She gave me the phone number of [phone] to call and I called and many rings and no answer. ( the lady did not know his address, only that he lives in [place]).   Pt needs ov prior to scheduling colonoscopy because he is on coumadin.   Mailing a letter to address listed on referral to [phone] [address]  [place] [address]

## 2012-04-27 ENCOUNTER — Other Ambulatory Visit: Payer: Self-pay | Admitting: Gastroenterology

## 2012-04-27 ENCOUNTER — Encounter: Payer: Self-pay | Admitting: Gastroenterology

## 2012-04-27 ENCOUNTER — Ambulatory Visit (INDEPENDENT_AMBULATORY_CARE_PROVIDER_SITE_OTHER): Payer: Medicaid Other | Admitting: Gastroenterology

## 2012-04-27 VITALS — BP 162/110 | HR 72 | Temp 98.2°F | Ht 72.0 in | Wt 266.8 lb

## 2012-04-27 DIAGNOSIS — Z1211 Encounter for screening for malignant neoplasm of colon: Secondary | ICD-10-CM | POA: Insufficient documentation

## 2012-04-27 DIAGNOSIS — I10 Essential (primary) hypertension: Secondary | ICD-10-CM

## 2012-04-27 MED ORDER — PEG 3350-KCL-NA BICARB-NACL 420 G PO SOLR: 4000.0000 mL | ORAL | Status: DC

## 2012-04-27 NOTE — Progress Notes (Signed)
Referring Provider: Claggett, Autumn Patty, PA-C Primary Care Physician:  Alleen Borne Primary Gastroenterologist:  Dr. Darrick Penna   Chief Complaint  Patient presents with  . Colonoscopy    HPI:   Mr. Hellberg is a 57 year old male who presents today for initial screening colonoscopy. Denies any abdominal pain, no nausea or vomiting. No constipation or diarrhea. Occasional scant hematochezia if straining. No melena. No upper GI symptoms.   His blood pressure is significantly elevated today. He states there was a mix-up at the pharmacy, and Toprol has not been filled. He has been without this for one week. He is asymptomatic currently. He tells me he has an appointment with his PCP tomorrow.    Past Medical History  Diagnosis Date  . Atrial flutter   . CHF (congestive heart failure)   . Systolic dysfunction   . Acute pulmonary embolism 2011  . Hypertension   . Alcohol abuse   . Lower extremity cellulitis     bilateral    Past Surgical History  Procedure Laterality Date  . None      Current Outpatient Prescriptions  Medication Sig Dispense Refill  . digoxin (LANOXIN) 0.25 MG tablet Take 0.25 mg by mouth daily.      Marland Kitchen diltiazem (DILACOR XR) 180 MG 24 hr capsule Take 180 mg by mouth daily.        . furosemide (LASIX) 40 MG tablet Take 20 mg by mouth daily.        Marland Kitchen lisinopril (PRINIVIL,ZESTRIL) 20 MG tablet Take 20 mg by mouth daily.      . potassium chloride (KLOR-CON) 20 MEQ packet Take 20 mEq by mouth daily.        Marland Kitchen warfarin (COUMADIN) 7.5 MG tablet Take by mouth as directed.        . metoprolol (TOPROL-XL) 100 MG 24 hr tablet Take 100 mg by mouth daily.         No current facility-administered medications for this visit.    Allergies as of 04/27/2012  . (No Known Allergies)    Family History  Problem Relation Age of Onset  . Heart attack Mother   . Hypertension Brother   . Diabetes Brother   . Colon cancer Neg Hx     History   Social History  . Marital  Status: Single    Spouse Name: N/A    Number of Children: N/A  . Years of Education: N/A   Occupational History  . 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 Smoking status: Never Smoker   . Smokeless tobacco: Not on file  . Alcohol Use: Yes     Comment: hx of ETOH abuse  . Drug Use: No  . Sexually Active: Not on file   Other Topics Concern  . Not on file   Social History Narrative   Lives alone, 2 daughters   No regular exercise    Review of Systems: Gen: Denies any fever, chills, loss of appetite, fatigue, weight loss. CV: Denies chest pain, heart palpitations, syncope, peripheral edema. Resp: +DOE GI: see HPI GU : Denies urinary burning, urinary frequency, urinary incontinence.  MS: Denies joint pain, muscle weakness, cramps, limited movement Derm: Denies rash, itching, dry skin Psych: Denies depression, anxiety, confusion or memory loss  Heme: Denies bruising, bleeding, and enlarged lymph nodes.  Physical Exam: BP 174/114  Pulse 72  Temp(Src) 98.2 F (36.8 C) (Oral)  Ht 6' (1.829 m)  Wt 266 lb 12.8 oz (  121.02 kg)  BMI 36.18 kg/m2 General:   Alert and oriented. Well-developed, well-nourished, pleasant and cooperative. Head:  Normocephalic and atraumatic. Eyes:  Conjunctiva pink, sclera clear, no icterus.   Conjunctiva pink. Ears:  Normal auditory acuity. Nose:  No deformity, discharge,  or lesions. Mouth:  No deformity or lesions, mucosa pink and moist.  Neck:  Supple, without mass or thyromegaly. Lungs:  Clear to auscultation bilaterally, without wheezing, rales, or rhonchi.  Heart:  S1, S2 present without murmurs noted.  Abdomen:  +BS, soft, non-tender and non-distended. Without mass or HSM. No rebound or guarding. No hernias noted. Rectal:  Deferred  Msk:  Symmetrical without gross deformities. Normal posture. Extremities:  Without clubbing or edema. Neurologic:  Alert and  oriented x4;  grossly normal  neurologically. Skin:  Intact, warm and dry without significant lesions or rashes Cervical Nodes:  No significant cervical adenopathy. Psych:  Alert and cooperative. Normal mood and affect.

## 2012-04-27 NOTE — Assessment & Plan Note (Signed)
See PCP today. Discussed signs/symptoms to watch for that would necessitate urgent evaluation.

## 2012-04-27 NOTE — Assessment & Plan Note (Signed)
57 year old male presents with need for initial screening colonoscopy. He has noted scant hematochezia with straining, which is likely benign. He has no lower or upper GI symptoms. His blood pressure is significantly elevated today, and he tells me he has been without Toprol XL X 1 week. We are attempting to have him see his primary care doctor today for further management. He is asymptomatic currently. He is also on Coumadin, with a history of aflutter and a PE. He will need Lovenox bridging prior to procedure.  Proceed with colonoscopy with Dr. Darrick Penna in the near future. The risks, benefits, and alternatives have been discussed in detail with the patient. They state understanding and desire to proceed. This will be scheduled about 4 weeks out to ensure his BP issues have been addressed. Hold Coumadin X 3 days, lovenox bridging. We will call patient with specific instructions.

## 2012-04-27 NOTE — Progress Notes (Signed)
Faxed to PCP

## 2012-04-27 NOTE — Patient Instructions (Addendum)
You will have a colonoscopy in the next few weeks with Dr. Darrick Penna. Because you are on a blood-thinner, we will have to make sure you stop your Coumadin three days before. However, you will need to take shots on those days instead.    Instructions for Coumadin:  We will call when it is closer to give you the exact dates to stop the Coumadin. You will hold it for 3 days before the procedure. Lovenox shots will be twice a day in place of the coumadin.   I want you to see your primary care about your blood pressure. We are trying to get an appointment for today.

## 2012-05-02 ENCOUNTER — Telehealth: Payer: Self-pay | Admitting: Gastroenterology

## 2012-05-02 MED ORDER — ENOXAPARIN SODIUM 120 MG/0.8ML ~~LOC~~ SOLN: 120.0000 mg | Freq: Two times a day (BID) | SUBCUTANEOUS | Status: DC

## 2012-05-02 NOTE — Telephone Encounter (Signed)
Called pt. Went over the instructions for the last dose of coumadin and the lovenox. He will come by Thurs about 9:00 AM to go over the lovenox injections.

## 2012-05-02 NOTE — Telephone Encounter (Signed)
Also, did patient see his PCP regarding his blood pressure? How is it doing?

## 2012-05-02 NOTE — Telephone Encounter (Signed)
Patient is scheduled for a procedure on April 1st with Dr. Darrick Penna.  COUMADIN AND LOVENOX INSTRUCTIONS:  Last dose of Coumadin March 28th.  March 29th: Lovenox BID March 30: Lovenox BID March 31: Lovenox once in morning. April 1st : No lovenox. Resume Coumadin after procedure (once Dr. Darrick Penna states ok).   Needs to see Coumadin Clinic shortly thereafter.

## 2012-05-02 NOTE — Progress Notes (Signed)
Last dose of Coumadin March 28th.  March 29th: Lovenox BID March 30: Lovenox BID March 31: Lovenox once in morning. April 1st : No lovenox. Resume Coumadin after procedure (once Dr. Darrick Penna states ok).

## 2012-05-03 NOTE — Telephone Encounter (Signed)
Pt said that he saw his PCP and  got his blood pressure pills. He goes back for a recheck on 05/12/2012, so that is before his procedure.

## 2012-05-05 ENCOUNTER — Telehealth: Payer: Self-pay

## 2012-05-05 NOTE — Telephone Encounter (Signed)
Tried to call pt. Many rings and no answer.  

## 2012-05-05 NOTE — Telephone Encounter (Signed)
Is his appt on March 27th?  Looks like his last dose of coumadin is the 28th. I don't want him to stop Coumadin and start Lovenox unnecessarily, as his BP may still be elevated. Let's get the note from the 27th when it is available (as soon as possible), so we can know if we need to postpone the colonoscopy or not. His BP is still too high for an elective procedure.

## 2012-05-05 NOTE — Telephone Encounter (Signed)
Pt came by the office to get instructions for the Lovenox. I also reviewed the technique/ and do's and don'ts of giving the injections. He demonstrated knowledge and understanding and also said his sister who is diabetic is going to assist him with the injections.  His BP today was 158/100 with large cuff in the left arm.  He has appt with his PCP before end of the month with PCP for recheck.

## 2012-05-06 ENCOUNTER — Encounter (HOSPITAL_COMMUNITY): Payer: Self-pay | Admitting: Pharmacy Technician

## 2012-05-06 NOTE — Telephone Encounter (Signed)
LMOM to call.

## 2012-05-09 NOTE — Telephone Encounter (Signed)
LM for pt to call

## 2012-05-10 ENCOUNTER — Encounter (HOSPITAL_COMMUNITY): Payer: Self-pay | Admitting: Pharmacy Technician

## 2012-05-10 NOTE — Telephone Encounter (Signed)
I called pt. He said he thinks his appt for PCP is for 11:00 AM on 05/12/2012. I asked him to please have them call us from that office and inform us of his BP that day. Tobi Bastos is just concerned about him stopping coumadin and not being able to do the procedure because of his BP. He said he would do so.

## 2012-05-12 NOTE — Telephone Encounter (Signed)
Called and spoke to pt's sister. She said he has not returned from the doctor appt. She will have him call.

## 2012-05-12 NOTE — Telephone Encounter (Signed)
His BP is significantly improved from visit with me. Spoke with Ethelene Browns, [place], who wanted to touch base with [place]. Increased his Lisinopril more today.  Please let patient know we can proceed with colonoscopy. Take medications as prescribed. Follow Lovenox instructions as previously outlined.

## 2012-05-12 NOTE — Telephone Encounter (Signed)
I called [facility] and spoke to [place], the nurse. She said pt was in today and his BP was 152/89. She said the Doctor's note said that he needs to get his BP under control before having the colonoscopy. He is scheduled to go back in a month for a recheck.

## 2012-05-12 NOTE — Telephone Encounter (Signed)
Called pt and was told he has not returned from PCP to check BP. She will have him call when he returns.

## 2012-05-13 NOTE — Telephone Encounter (Signed)
Pt aware OK to proceed with instructions for his colonoscopy.

## 2012-05-16 MED ORDER — SODIUM CHLORIDE 0.45 % IV SOLN: INTRAVENOUS | Status: DC

## 2012-05-17 ENCOUNTER — Encounter (HOSPITAL_COMMUNITY)
Admission: RE | Disposition: A | Discharge status: Home / Self Care | Payer: Self-pay | Source: Ambulatory Visit | Attending: Gastroenterology

## 2012-05-17 ENCOUNTER — Encounter (HOSPITAL_COMMUNITY): Payer: Self-pay | Admitting: *Deleted

## 2012-05-17 ENCOUNTER — Ambulatory Visit (HOSPITAL_COMMUNITY)
Admission: RE | Admit: 2012-05-17 | Discharge: 2012-05-17 | Disposition: A | Discharge status: Home / Self Care | Payer: Medicaid Other | Source: Ambulatory Visit | Attending: Gastroenterology | Admitting: Gastroenterology

## 2012-05-17 DIAGNOSIS — K648 Other hemorrhoids: Secondary | ICD-10-CM | POA: Insufficient documentation

## 2012-05-17 DIAGNOSIS — K621 Rectal polyp: Secondary | ICD-10-CM

## 2012-05-17 DIAGNOSIS — K625 Hemorrhage of anus and rectum: Secondary | ICD-10-CM | POA: Insufficient documentation

## 2012-05-17 DIAGNOSIS — D128 Benign neoplasm of rectum: Secondary | ICD-10-CM | POA: Insufficient documentation

## 2012-05-17 DIAGNOSIS — K620 Anal polyp: Secondary | ICD-10-CM

## 2012-05-17 DIAGNOSIS — Z1211 Encounter for screening for malignant neoplasm of colon: Secondary | ICD-10-CM

## 2012-05-17 DIAGNOSIS — I10 Essential (primary) hypertension: Secondary | ICD-10-CM | POA: Insufficient documentation

## 2012-05-17 HISTORY — PX: COLONOSCOPY: SHX5424

## 2012-05-17 SURGERY — COLONOSCOPY
Anesthesia: Moderate Sedation

## 2012-05-17 MED ORDER — MIDAZOLAM HCL 5 MG/5ML IJ SOLN
INTRAMUSCULAR | Status: AC
  Filled 2012-05-17: qty 10

## 2012-05-17 MED ORDER — ENOXAPARIN SODIUM 120 MG/0.8ML ~~LOC~~ SOLN: 120.0000 mg | Freq: Two times a day (BID) | SUBCUTANEOUS | Status: DC

## 2012-05-17 MED ORDER — SODIUM CHLORIDE 0.9 % IV SOLN
INTRAVENOUS | Status: DC
  Administered 2012-05-17: 1000 mL via INTRAVENOUS

## 2012-05-17 MED ORDER — MEPERIDINE HCL 100 MG/ML IJ SOLN
INTRAMUSCULAR | Status: DC | PRN
  Administered 2012-05-17: 50 mg via INTRAVENOUS
  Administered 2012-05-17: 25 mg via INTRAVENOUS

## 2012-05-17 MED ORDER — MIDAZOLAM HCL 5 MG/5ML IJ SOLN
INTRAMUSCULAR | Status: DC | PRN
  Administered 2012-05-17 (×2): 2 mg via INTRAVENOUS
  Administered 2012-05-17: 1 mg via INTRAVENOUS

## 2012-05-17 MED ORDER — MEPERIDINE HCL 100 MG/ML IJ SOLN
INTRAMUSCULAR | Status: AC
  Filled 2012-05-17: qty 1

## 2012-05-17 MED ORDER — SIMETHICONE 40 MG/0.6ML PO SUSP
ORAL | Status: DC | PRN
  Administered 2012-05-17: 10:00:00

## 2012-05-17 NOTE — H&P (Signed)
  Primary Care Physician:  Alleen Borne Primary Gastroenterologist:  Dr. Darrick Penna  Pre-Procedure History & Physical: HPI:  Dennis Swanson is a 57 y.o. male here for  BRBPR.  TAKES COUMADIN FOR AFLTR/PE/CHF.  Past Medical History  Diagnosis Date  . CHF (congestive heart failure)   . Systolic dysfunction   . Acute pulmonary embolism 2011  . Hypertension   . Alcohol abuse   . Lower extremity cellulitis     bilateral  . Atrial flutter     Past Surgical History  Procedure Laterality Date  . None      Prior to Admission medications   Medication Sig Start Date End Date Taking? Authorizing Provider  digoxin (LANOXIN) 0.25 MG tablet Take 0.25 mg by mouth daily.   Yes Historical Provider, MD  diltiazem (DILACOR XR) 180 MG 24 hr capsule Take 180 mg by mouth daily.     Yes Historical Provider, MD  enoxaparin (LOVENOX) 120 MG/0.8ML injection Inject 0.8 mLs (120 mg total) into the skin every 12 (twelve) hours. As directed. Last dose March 31st in the morning. 05/02/12  Yes Nira Retort, NP  furosemide (LASIX) 20 MG tablet Take 20 mg by mouth daily.   Yes Historical Provider, MD  lisinopril (PRINIVIL,ZESTRIL) 20 MG tablet Take 20 mg by mouth daily.   Yes Historical Provider, MD  metoprolol (TOPROL-XL) 100 MG 24 hr tablet Take 100 mg by mouth daily.     Yes Historical Provider, MD  polyethylene glycol-electrolytes (TRILYTE) 420 G solution Take 4,000 mLs by mouth as directed. 04/27/12  Yes West Bali, MD  potassium chloride (KLOR-CON) 20 MEQ packet Take 20 mEq by mouth daily.     Yes Historical Provider, MD  warfarin (COUMADIN) 7.5 MG tablet Take 7.5 mg by mouth at bedtime.    Yes Historical Provider, MD    Allergies as of 04/27/2012  . (No Known Allergies)    Family History  Problem Relation Age of Onset  . Heart attack Mother   . Hypertension Brother   . Diabetes Brother   . Colon cancer Neg Hx     History   Social History  . Marital Status: Single    Spouse Name:  N/A    Number of Children: N/A  . Years of Education: N/A   Occupational History  . 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 Smoking status: Never Smoker   . Smokeless tobacco: Not on file  . Alcohol Use: Yes     Comment: hx of ETOH abuse  . Drug Use: No  . Sexually Active: Not on file   Other Topics Concern  . Not on file   Social History Narrative   Lives alone, 2 daughters   No regular exercise    Review of Systems: See HPI, otherwise negative ROS   Physical Exam: BP 145/95  Pulse 73  Temp(Src) 98.3 F (36.8 C) (Oral)  Resp 20  SpO2 95% General:   Alert,  pleasant and cooperative in NAD Head:  Normocephalic and atraumatic. Neck:  Supple; Lungs:  Clear throughout to auscultation.    Heart:  Regular rate and rhythm. Abdomen:  Soft, nontender and nondistended. Normal bowel sounds, without guarding, and without rebound.   Neurologic:  Alert and  oriented x4;  grossly normal neurologically.  Impression/Plan:    BRBPR  PLAN: TCS TODAY

## 2012-05-17 NOTE — Op Note (Signed)
[facility] [address] [place], [address]   COLONOSCOPY PROCEDURE REPORT  PATIENT: Dennis Swanson, Dennis Swanson  MR#: [id number] BIRTHDATE: Aug 20, 1955 , 57  yrs. old GENDER: Male ENDOSCOPIST: Jonette Eva, MD REFERRED NF:AOZH Claggett, PA-C ANTHONY ROBERTSON, PA-C PROCEDURE DATE:  05/17/2012 PROCEDURE:   Colonoscopy with cold biopsy polypectomy INDICATIONS:Rectal Bleeding.  TAKES COUMADIN FOR AFLTR/PE (2011)/CHF-2011 EF 40-45% PMHX: ETOH/HTN MEDICATIONS: Demerol 100 mg IV and Versed 5 mg IV  DESCRIPTION OF PROCEDURE:    Physical exam was performed.  Informed consent was obtained from the patient after explaining the benefits, risks, and alternatives to procedure.  The patient was connected to monitor and placed in left lateral position. Continuous oxygen was provided by nasal cannula and IV medicine administered through an indwelling cannula.  After administration of sedation and rectal exam, the patients rectum was intubated and the [id number] ([id number])  colonoscope was advanced under direct visualization to the cecum.  The scope was removed slowly by carefully examining the color, texture, anatomy, and integrity mucosa on the way out.  The patient was recovered in endoscopy and discharged home in satisfactory condition.       COLON FINDINGS: The TRANSVERSE AND SIGMOID COLON ARE redundant. Manual abdominal counter-pressure was used to reach the cecum, Moderate sized internal hemorrhoids were found.  , A sessile polyp measuring 3 mm in size was found in the rectum.  A polypectomy was performed with cold forceps.  The resection was complete and the polyp tissue was completely retrieved, and The colon was otherwise normal.  There was no diverticulosis, inflammation, or cancers unless previously stated.  PREP QUALITY: good. CECAL W/D TIME: 15 minutes  COMPLICATIONS: None  ENDOSCOPIC IMPRESSION: 1.   INTERMITTENT RECTAL BLEEDING DUE TO HEMORRHOIDS 2.  SMALL  RECTAL POLYP  RECOMMENDATIONS: HOLD COUMADIN.  START LOVENOX APR 4.  RESTART COUMADIN APR 5.  STOP LOVENOX APR 6. PT/INR APR 8  FOLLOW A HIGH FIBER DIET.  AVOID ITEMS THAT CAUSE BLOATING & GAS. BIOPSY RESULTS SHOULD BE BACK IN 7 DAYS. Next colonoscopy in 10 years WITH AN OVERTUBE       _______________________________ eSignedJonette Eva, MD 05/17/2012 11:22 AM     PATIENT NAME:  Dennis Swanson, Dennis Swanson MR#: [id number]

## 2012-05-23 ENCOUNTER — Encounter (HOSPITAL_COMMUNITY): Payer: Self-pay | Admitting: Gastroenterology

## 2012-05-24 ENCOUNTER — Telehealth: Payer: Self-pay | Admitting: Gastroenterology

## 2012-05-24 NOTE — Telephone Encounter (Signed)
Cc: PCP 10 yr TCS nicd  

## 2012-05-24 NOTE — Telephone Encounter (Signed)
Please call pt. HE had a polypoid lesion removed and it was benign.    HAVE PT/INR CHECKED TUE APR 8.  FOLLOW A HIGH FIBER DIET. AVOID ITEMS THAT CAUSE BLOATING & GAS.   Next colonoscopy in 10 years WITH AN OVERTUBE.

## 2012-05-25 ENCOUNTER — Other Ambulatory Visit: Payer: Self-pay

## 2012-05-25 DIAGNOSIS — Z7901 Long term (current) use of anticoagulants: Secondary | ICD-10-CM

## 2012-05-25 NOTE — Telephone Encounter (Signed)
REVIEWED.  

## 2012-05-25 NOTE — Telephone Encounter (Signed)
Pt called and was informed. He forgot to get his PT and INR checked on 05/24/2012. He said he has his order and he will go to the lab this afternoon.

## 2012-05-25 NOTE — Telephone Encounter (Signed)
LM for pt to call

## 2012-08-06 NOTE — Progress Notes (Signed)
REVIEWED.  TCS MAR 2014 IH/BENIGN POLYPOID TISSUE

## 2012-11-11 ENCOUNTER — Ambulatory Visit: Payer: Self-pay | Admitting: *Deleted

## 2012-11-11 DIAGNOSIS — I4892 Unspecified atrial flutter: Secondary | ICD-10-CM

## 2012-11-11 DIAGNOSIS — I2699 Other pulmonary embolism without acute cor pulmonale: Secondary | ICD-10-CM

## 2012-11-11 DIAGNOSIS — Z7901 Long term (current) use of anticoagulants: Secondary | ICD-10-CM

## 2015-04-02 ENCOUNTER — Emergency Department (HOSPITAL_COMMUNITY)
Admission: EM | Admit: 2015-04-02 | Discharge: 2015-04-02 | Disposition: A | Discharge status: Home / Self Care | Payer: Medicaid Other | Attending: Emergency Medicine | Admitting: Emergency Medicine

## 2015-04-02 ENCOUNTER — Emergency Department (HOSPITAL_COMMUNITY): Payer: Medicaid Other

## 2015-04-02 ENCOUNTER — Encounter (HOSPITAL_COMMUNITY): Payer: Self-pay | Admitting: Emergency Medicine

## 2015-04-02 DIAGNOSIS — J40 Bronchitis, not specified as acute or chronic: Secondary | ICD-10-CM

## 2015-04-02 DIAGNOSIS — M791 Myalgia: Secondary | ICD-10-CM | POA: Diagnosis not present

## 2015-04-02 DIAGNOSIS — Z79899 Other long term (current) drug therapy: Secondary | ICD-10-CM | POA: Insufficient documentation

## 2015-04-02 DIAGNOSIS — J209 Acute bronchitis, unspecified: Secondary | ICD-10-CM | POA: Diagnosis not present

## 2015-04-02 DIAGNOSIS — Z86711 Personal history of pulmonary embolism: Secondary | ICD-10-CM | POA: Insufficient documentation

## 2015-04-02 DIAGNOSIS — Z872 Personal history of diseases of the skin and subcutaneous tissue: Secondary | ICD-10-CM | POA: Diagnosis not present

## 2015-04-02 DIAGNOSIS — R509 Fever, unspecified: Secondary | ICD-10-CM | POA: Diagnosis present

## 2015-04-02 DIAGNOSIS — I509 Heart failure, unspecified: Secondary | ICD-10-CM | POA: Insufficient documentation

## 2015-04-02 DIAGNOSIS — I10 Essential (primary) hypertension: Secondary | ICD-10-CM | POA: Diagnosis not present

## 2015-04-02 MED ORDER — PREDNISONE 20 MG PO TABS: 40.0000 mg | ORAL_TABLET | Freq: Every day | ORAL | Status: DC

## 2015-04-02 MED ORDER — ALBUTEROL SULFATE HFA 108 (90 BASE) MCG/ACT IN AERS: 1.0000 | INHALATION_SPRAY | Freq: Four times a day (QID) | RESPIRATORY_TRACT | Status: DC | PRN

## 2015-04-02 MED ORDER — DOXYCYCLINE HYCLATE 100 MG PO CAPS: 100.0000 mg | ORAL_CAPSULE | Freq: Two times a day (BID) | ORAL | Status: DC

## 2015-04-02 MED ORDER — GUAIFENESIN-CODEINE 100-10 MG/5ML PO SYRP: 10.0000 mL | ORAL_SOLUTION | Freq: Three times a day (TID) | ORAL | Status: DC | PRN

## 2015-04-02 MED ORDER — ALBUTEROL SULFATE (2.5 MG/3ML) 0.083% IN NEBU
2.5000 mg | INHALATION_SOLUTION | Freq: Once | RESPIRATORY_TRACT | Status: AC
  Administered 2015-04-02: 2.5 mg via RESPIRATORY_TRACT
  Filled 2015-04-02: qty 3

## 2015-04-02 MED ORDER — IPRATROPIUM-ALBUTEROL 0.5-2.5 (3) MG/3ML IN SOLN
3.0000 mL | Freq: Once | RESPIRATORY_TRACT | Status: AC
  Administered 2015-04-02: 3 mL via RESPIRATORY_TRACT
  Filled 2015-04-02: qty 3

## 2015-04-02 NOTE — ED Notes (Signed)
Pt states started feeling bad yesterday after his wife was dx w/ the flu. Says having chills, body aches & slight cough. Denies taking any medications for symptoms.

## 2015-04-02 NOTE — ED Notes (Signed)
Respiratory paged second time for a breathing treatment.

## 2015-04-02 NOTE — ED Notes (Signed)
Respiratory paged for a breathing treatment at this time.  

## 2015-04-02 NOTE — ED Notes (Signed)
Pt alert & oriented x4, stable gait. Patient given discharge instructions, paperwork & prescription(s). Patient informed not to drive, operate any equipment & handel any important documents 4 hours after taking pain medication. Patient  instructed to stop at the registration desk to finish any additional paperwork. Patient  verbalized understanding. Pt left department w/ no further questions. 

## 2015-04-02 NOTE — ED Notes (Signed)
PT c/o fever, body aches, headache x1 day. PT states wife was dx with flu this week. PT denies any OTC medication use today.

## 2015-04-03 ENCOUNTER — Encounter: Payer: Self-pay | Admitting: Physician Assistant

## 2015-04-03 ENCOUNTER — Ambulatory Visit (INDEPENDENT_AMBULATORY_CARE_PROVIDER_SITE_OTHER): Payer: Medicaid Other | Admitting: Physician Assistant

## 2015-04-03 VITALS — BP 138/88 | HR 105 | Ht 71.0 in | Wt 290.3 lb

## 2015-04-03 DIAGNOSIS — I10 Essential (primary) hypertension: Secondary | ICD-10-CM

## 2015-04-03 DIAGNOSIS — I429 Cardiomyopathy, unspecified: Secondary | ICD-10-CM

## 2015-04-03 DIAGNOSIS — I482 Chronic atrial fibrillation, unspecified: Secondary | ICD-10-CM

## 2015-04-03 DIAGNOSIS — I428 Other cardiomyopathies: Secondary | ICD-10-CM

## 2015-04-03 DIAGNOSIS — Z7901 Long term (current) use of anticoagulants: Secondary | ICD-10-CM

## 2015-04-03 DIAGNOSIS — I480 Paroxysmal atrial fibrillation: Secondary | ICD-10-CM | POA: Insufficient documentation

## 2015-04-03 DIAGNOSIS — I4891 Unspecified atrial fibrillation: Secondary | ICD-10-CM | POA: Insufficient documentation

## 2015-04-03 MED ORDER — DIGOXIN 250 MCG PO TABS: 0.2500 mg | ORAL_TABLET | Freq: Every day | ORAL | Status: DC

## 2015-04-03 MED ORDER — WARFARIN SODIUM 2.5 MG PO TABS: 2.5000 mg | ORAL_TABLET | Freq: Every day | ORAL | Status: DC

## 2015-04-03 MED ORDER — DILTIAZEM HCL ER COATED BEADS 240 MG PO CP24: 240.0000 mg | ORAL_CAPSULE | Freq: Every day | ORAL | Status: DC

## 2015-04-03 NOTE — Progress Notes (Signed)
Cardiology Office Note   Date:  04/03/2015   ID:  Miyon, Voight [birth year], MRN [id number]  PCP:  Rosita Fire, MD  Cardiologist:   Previously Dr. Lattie Haw to be established with Dr. Harl Bowie   Chief Complaint: Ran out of medicines    History of Present Illness: Dennis Swanson is a 60 y.o. male who presents  To become reestablished with our practice. He was last seen by Dr. Lattie Haw in 2011 for atrial flutter associated with CHF and mild impaired LV systolic function.  Echo in 2011 showed mild LVH EF 40-45% with diffuse hypokinesis and mild to moderate MR and moderate TR. 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 He also had a DVT and pulmonary embolus at that time.Cardioversion was planned but never carried out as a result of the patient withdrawing consent. He remained on Coumadin. He transferred his care to [facility] where they were initially checking his Coumadin but he says he has not been checked in over 6 months and ran out of his Coumadin 2 weeks ago. He also ran out of his digoxin. He was in the emergency room yesterday and diagnosed with the flu. He was given inhalers, antibiotics and 5 days of prednisone. He was told to reestablish with [place].   Patient denies any change in his cardiac health over the past 6 years. He does not feel his heart beating irregular , denies chest pain , palpitations, dyspnea on exertion, edema, dizziness or presyncope. His heart rate is 124 bpm today and he does not really feel it. He does not smoke. He continues to drink alcohol, about a pint on the weekend.    Past Medical History  Diagnosis Date  . CHF (congestive heart failure) ([place])   . Systolic dysfunction   . Acute pulmonary embolism ([place]) 2011  . Hypertension   . Alcohol abuse   . Lower extremity cellulitis     bilateral  . Atrial flutter [facility])     Past Surgical History  Procedure Laterality Date  . None    . Colonoscopy N/A 05/17/2012    Procedure: COLONOSCOPY;   Surgeon: Danie Binder, MD;  Location: [facility];  Service: Endoscopy;  Laterality: N/A;  10:30     Current Outpatient Prescriptions  Medication Sig Dispense Refill  . albuterol (PROVENTIL HFA;VENTOLIN HFA) 108 (90 Base) MCG/ACT inhaler Inhale 1-2 puffs into the lungs every 6 (six) hours as needed for wheezing or shortness of breath. 1 Inhaler 0  . digoxin (LANOXIN) 0.25 MG tablet Take 0.25 mg by mouth daily.    Marland Kitchen diltiazem (DILACOR XR) 180 MG 24 hr capsule Take 180 mg by mouth daily.      Marland Kitchen doxycycline (VIBRAMYCIN) 100 MG capsule Take 1 capsule (100 mg total) by mouth 2 (two) times daily. 20 capsule 0  . enoxaparin (LOVENOX) 120 MG/0.8ML injection Inject 0.8 mLs (120 mg total) into the skin every 12 (twelve) hours. 4 Syringe 0  . furosemide (LASIX) 20 MG tablet Take 20 mg by mouth daily.    Marland Kitchen guaiFENesin-codeine (ROBITUSSIN AC) 100-10 MG/5ML syrup Take 10 mLs by mouth 3 (three) times daily as needed. 120 mL 0  . lisinopril (PRINIVIL,ZESTRIL) 20 MG tablet Take 20 mg by mouth daily.    . metoprolol (TOPROL-XL) 100 MG 24 hr tablet Take 100 mg by mouth daily.      . potassium chloride (KLOR-CON) 20 MEQ packet Take 20 mEq by mouth daily.      . predniSONE (DELTASONE) 20  MG tablet Take 2 tablets (40 mg total) by mouth daily. For 5 days 10 tablet 0   No current facility-administered medications for this visit.    Allergies:   Review of patient's allergies indicates no known allergies.    Social History:  The patient  reports that he has never smoked. He does not have any smokeless tobacco history on file. He reports that he drinks about 1.8 oz of alcohol per week. He reports that he does not use illicit drugs.   Family History:  The patient's family history includes Diabetes in his brother; Heart attack in his mother; Hypertension in his brother. There is no history of Colon cancer.    ROS:  Please see the history of present illness.   Otherwise, review of systems are positive for none.    All other systems are reviewed and negative.    PHYSICAL EXAM: VS:  BP 138/88 mmHg  Pulse 105  Ht 5\' 11"  (1.803 m)  Wt 290 lb 4.8 oz (131.679 kg)  BMI 40.51 kg/m2  SpO2 90% , BMI Body mass index is 40.51 kg/(m^2). GEN: Well nourished, well developed, in no acute distress Neck: no JVD, HJR, carotid bruits, or masses Cardiac: RRR;  Positive S4, 2/6 systolic murmur at left sternal border, no rubs, thrill or heave,  Respiratory:   Decreased breath sounds with inspiratory  And expiratory wheezing GI: soft, nontender, nondistended, + BS MS: no deformity or atrophy Extremities: without cyanosis, clubbing, edema, good distal pulses bilaterally.  Skin: warm and dry, no rash Neuro:  Strength and sensation are intact    EKG:  EKG is ordered today. The ekg ordered today demonstrates  Atrial fibrillation at 124 bpm poor R wave progression anteriorly   Recent Labs: No results found for requested labs within last 365 days.    Lipid Panel    Component Value Date/Time   CHOL  08/21/2009 0552    143        ATP III CLASSIFICATION:  <200     mg/dL   Desirable  200-239  mg/dL   Borderline High  >=240    mg/dL   High          TRIG 66 08/21/2009 0552   HDL 31* 08/21/2009 0552   CHOLHDL 4.6 08/21/2009 0552   VLDL 13 08/21/2009 0552   LDLCALC  08/21/2009 0552    99        Total Cholesterol/HDL:CHD Risk Coronary Heart Disease Risk Table                     Men   Women  1/2 Average Risk   3.4   3.3  Average Risk       5.0   4.4  2 X Average Risk   9.6   7.1  3 X Average Risk  23.4   11.0        Use the calculated Patient Ratio above and the CHD Risk Table to determine the patient's CHD Risk.        ATP III CLASSIFICATION (LDL):  <100     mg/dL   Optimal  100-129  mg/dL   Near or Above                    Optimal  130-159  mg/dL   Borderline  160-189  mg/dL   High  >190     mg/dL   Very High      Wt Readings from Last  3 Encounters:  04/03/15 290 lb 4.8 oz (131.679 kg)   04/02/15 270 lb (122.471 kg)  04/27/12 266 lb 12.8 oz (121.02 kg)      Other studies Reviewed: Additional studies/ records that were reviewed today include and review of the records demonstrates:    Echo in 2011 showed mild LVH EF 40-45% with diffuse hypokinesis and mild to moderate MR and moderate TR.  ASSESSMENT AND PLAN:  Atrial fibrillation ([place])  Patient has chronic atrial fibrillation and has been on Coumadin until he ran out 2 weeks ago. He was followed here until 2012 and then was being followed at [facility]. He also ran out of his digoxin. His heart rate is 124 bpm but he was also some placed on inhalers yesterday for wheezing when he was diagnosed with the flu. He also ran out of his digoxin. We'll resume digoxin 0.25 mg daily. Increase diltiazem to 240 mg daily. Resume Coumadin 2.5 mg daily in be checked and the Coumadin clinic by Lattie Haw next Wednesday. I talked to him about starting one of the NOAC's but  He will just stay on Coumadin since it's worked well for him over the years. Will set him up to see Dr. Harl Bowie in follow-up to become established.  Nonischemic cardiomyopathy [facility])  Patient has history of nonischemic cardiomyopathy EF 40-45% in 2011 on echo. Patient has not had any trouble with heart failure over the years.  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 He continues to drink about a pint every weekend. Recommend decrease alcohol intake.  Essential hypertension  Blood pressure stable  Current use of long term anticoagulation  Patient has been on Coumadin for chronic atrial fibrillation since 2012. He also had DVT and pulmonary embolus in 2011. He ran out of his Coumadin 2 weeks ago because of couldn't be refilled. He was not being followed here since 2012. Discussed with Christella Noa and will start him at 2.5 mg daily since he is on antibiotics and steroids. He will be seen in our Coumadin clinic next Wednesday.    Signed, Ermalinda Barrios, PA-C  04/03/2015 2:15 PM     [facility] [address], [place], [place]  [address] Phone: [phone]; Fax: [phone]

## 2015-04-03 NOTE — Assessment & Plan Note (Signed)
Blood pressure stable ? ?

## 2015-04-03 NOTE — Patient Instructions (Signed)
Your physician recommends that you schedule a follow-up appointment in: 1 Month with Dr. Harl Bowie  Your physician has recommended you make the following change in your medication:   Increase Diltiazem to 240 mg Daily Restart Digoxin 0.25 mg Daily Start Coumadin 2.5 mg Daily   Your physician recommends that you schedule a follow-up appointment in: 1 Week with Lattie Haw with the Coumadin Clinic  If you need a refill on your cardiac medications before your next appointment, please call your pharmacy.  Thank you for choosing [facility]!

## 2015-04-03 NOTE — Assessment & Plan Note (Addendum)
Patient has chronic atrial fibrillation and has been on Coumadin until he ran out 2 weeks ago. He was followed here until 2012 and then was being followed at [facility]. He also ran out of his digoxin. His heart rate is 124 bpm but he was also some placed on inhalers yesterday for wheezing when he was diagnosed with the flu. He also ran out of his digoxin. We'll resume digoxin 0.25 mg daily. Increase diltiazem to 240 mg daily. Resume Coumadin 2.5 mg daily in be checked and the Coumadin clinic by Lattie Haw next Wednesday. I talked to him about starting one of the NOAC's but  He will just stay on Coumadin since it's worked well for him over the years. Will set him up to see Dr. Harl Bowie in follow-up to become established.  This patients CHA2DS2-VASc Score and unadjusted Ischemic Stroke Rate (% per year) is equal to 4.8 % stroke rate/year from a score of 4  Above score calculated as 1 point each if present [CHF, HTN, DM, Vascular=MI/PAD/Aortic Plaque, Age if 65-74, or Male] Above score calculated as 2 points each if present [Age > 75, or Stroke/TIA/TE]

## 2015-04-03 NOTE — Assessment & Plan Note (Signed)
Patient has been on Coumadin for chronic atrial fibrillation since 2012. He also had DVT and pulmonary embolus in 2011. He ran out of his Coumadin 2 weeks ago because of couldn't be refilled. He was not being followed here since 2012. Discussed with Christella Noa and will start him at 2.5 mg daily since he is on antibiotics and steroids. He will be seen in our Coumadin clinic next Wednesday.

## 2015-04-03 NOTE — Assessment & Plan Note (Signed)
Patient has history of nonischemic cardiomyopathy EF 40-45% in 2011 on echo. Patient has not had any trouble with heart failure over the years.  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 He continues to drink about a pint every weekend. Recommend decrease alcohol intake.

## 2015-04-04 NOTE — ED Provider Notes (Signed)
CSN: [id number]     Arrival date & time 04/02/15  1109 History   First MD Initiated Contact with Patient 04/02/15 1125     Chief Complaint  Patient presents with  . Fever     (Consider location/radiation/quality/duration/timing/severity/associated sxs/prior Treatment) HPI   Dennis Swanson is a 60 y.o. male with hx of PE diagnosed in 2011, HTN and CHF who presents to the Emergency Department complaining of sudden onset of generalized body aches, chills, cough and congestion for one day.  He states that his wife recently had similar symptoms and he is concerned that he may have the "flu"  Cough is mostly non-productive.  He has not taken any medications for his symptoms.  He denies shortness of breath, chest pain, N/V, known fever, neck pain or stiffness, LE edema.  Takes coumadin daily, he has an appt this week to have his INR level checked.     Past Medical History  Diagnosis Date  . CHF (congestive heart failure) ([place])   . Systolic dysfunction   . Acute pulmonary embolism ([place]) 2011  . Hypertension   . Alcohol abuse   . Lower extremity cellulitis     bilateral  . Atrial flutter [facility])    Past Surgical History  Procedure Laterality Date  . None    . Colonoscopy N/A 05/17/2012    Procedure: COLONOSCOPY;  Surgeon: Danie Binder, MD;  Location: [facility];  Service: Endoscopy;  Laterality: N/A;  10:30   Family History  Problem Relation Age of Onset  . Heart attack Mother   . Hypertension Brother   . Diabetes Brother   . Colon cancer Neg Hx    Social History  Substance Use Topics  . Smoking status: Never Smoker   . Smokeless tobacco: None  . Alcohol Use: 1.8 oz/week    3 Cans of beer per week     Comment: once a week    Review of Systems  Constitutional: Positive for fatigue. Negative for fever, chills, activity change and appetite change.  HENT: Positive for congestion and rhinorrhea. Negative for facial swelling, sore throat and trouble swallowing.   Eyes:  Negative for visual disturbance.  Respiratory: Positive for cough. Negative for chest tightness, shortness of breath, wheezing and stridor.   Cardiovascular: Negative for chest pain and palpitations.  Gastrointestinal: Negative for nausea, vomiting and abdominal pain.  Genitourinary: Negative for dysuria.  Musculoskeletal: Positive for myalgias. Negative for neck pain and neck stiffness.  Skin: Negative.  Negative for rash.  Neurological: Positive for headaches. Negative for dizziness, syncope, speech difficulty, weakness and numbness.  Hematological: Negative for adenopathy.  Psychiatric/Behavioral: Negative for confusion.  All other systems reviewed and are negative.     Allergies  Review of patient's allergies indicates no known allergies.  Home Medications   Prior to Admission medications   Medication Sig Start Date End Date Taking? Authorizing Provider  albuterol (PROVENTIL HFA;VENTOLIN HFA) 108 (90 Base) MCG/ACT inhaler Inhale 1-2 puffs into the lungs every 6 (six) hours as needed for wheezing or shortness of breath. 04/02/15   Aarav Burgett, PA-C  digoxin (LANOXIN) 0.25 MG tablet Take 1 tablet (0.25 mg total) by mouth daily. 04/03/15   Imogene Burn, PA-C  diltiazem (CARDIZEM CD) 240 MG 24 hr capsule Take 1 capsule (240 mg total) by mouth daily. 04/03/15   Imogene Burn, PA-C  diltiazem (DILACOR XR) 180 MG 24 hr capsule Take 180 mg by mouth daily.      Historical Provider, MD  doxycycline (VIBRAMYCIN) 100 MG capsule Take 1 capsule (100 mg total) by mouth 2 (two) times daily. 04/02/15   Annjeanette Sarwar, PA-C  furosemide (LASIX) 20 MG tablet Take 20 mg by mouth daily.    Historical Provider, MD  guaiFENesin-codeine (ROBITUSSIN AC) 100-10 MG/5ML syrup Take 10 mLs by mouth 3 (three) times daily as needed. 04/02/15   Anuar Walgren, PA-C  lisinopril (PRINIVIL,ZESTRIL) 20 MG tablet Take 20 mg by mouth daily.    Historical Provider, MD  metoprolol (TOPROL-XL) 100 MG 24 hr tablet Take 100  mg by mouth daily.      Historical Provider, MD  potassium chloride (KLOR-CON) 20 MEQ packet Take 20 mEq by mouth daily.      Historical Provider, MD  predniSONE (DELTASONE) 20 MG tablet Take 2 tablets (40 mg total) by mouth daily. For 5 days 04/02/15   Latamara Melder, PA-C  warfarin (COUMADIN) 2.5 MG tablet Take 1 tablet (2.5 mg total) by mouth daily. 04/03/15   Imogene Burn, PA-C   BP 149/80 mmHg  Pulse 82  Temp(Src) 99.3 F (37.4 C) (Oral)  Resp 16  Ht 5\' 11"  (1.803 m)  Wt 122.471 kg  BMI 37.67 kg/m2  SpO2 93% Physical Exam  Constitutional: He is oriented to person, place, and time. He appears well-developed and well-nourished. No distress.  HENT:  Head: Normocephalic and atraumatic.  Right Ear: Tympanic membrane and ear canal normal.  Left Ear: Tympanic membrane and ear canal normal.  Nose: Mucosal edema present.  Mouth/Throat: Uvula is midline, oropharynx is clear and moist and mucous membranes are normal. No oropharyngeal exudate.  Eyes: EOM are normal. Pupils are equal, round, and reactive to light.  Neck: Normal range of motion, full passive range of motion without pain and phonation normal. Neck supple.  Cardiovascular: Normal rate, regular rhythm, normal heart sounds and intact distal pulses.   No murmur heard. Pulmonary/Chest: Effort normal. No stridor. No respiratory distress. He has wheezes. He has no rales. He exhibits no tenderness.  Coarse lungs sounds bilaterally, slightly diminished.  Inspiratory wheezing.  No distress.  Musculoskeletal: He exhibits no edema.  Lymphadenopathy:    He has no cervical adenopathy.  Neurological: He is alert and oriented to person, place, and time. He exhibits normal muscle tone. Coordination normal.  Skin: Skin is warm and dry.  Nursing note and vitals reviewed.   ED Course  Procedures (including critical care time) Labs Review Labs Reviewed - No data to display  Imaging Review Dg Chest 2 View  04/02/2015  CLINICAL DATA:   Cough today, history CHF, hypertension, atrial flutter, pulmonary embolism, initial encounter EXAM: CHEST  2 VIEW COMPARISON:  08/23/2009 FINDINGS: Enlargement of cardiac silhouette with pulmonary vascular congestion. Mild elongation of thoracic aorta. Minimal RIGHT basilar atelectasis. No infiltrate, pleural effusion or pneumothorax. Chronic elevation of RIGHT diaphragm with colonic interposition. Scattered endplate spur formation thoracic spine. IMPRESSION: Enlargement of cardiac silhouette with pulmonary vascular congestion. Atelectasis RIGHT base. Electronically Signed   By: Lavonia Dana M.D.   On: 04/02/2015 11:43    I have personally reviewed and evaluated these images and lab results as part of my medical decision-making.   EKG Interpretation None      MDM   Final diagnoses:  Bronchitis    Pt is well appearing.  Non-toxic.  Wheezing and lung sounds improved after neb.  Vitals rechecked shortly after neb, and the mild tachycardia on recheck felt to be related to the albuterol tx.  Pt reports feeling better.  He appears stable for d/c and advised to return for any worsening sx's.  Will tx with doxy, albuterol MDI, cough syrup and prednisone.      Kem Parkinson, PA-C 04/04/15 1235  Ripley Fraise, MD 04/04/15 [phone]

## 2015-04-10 ENCOUNTER — Ambulatory Visit (INDEPENDENT_AMBULATORY_CARE_PROVIDER_SITE_OTHER): Payer: Medicaid Other | Admitting: *Deleted

## 2015-04-10 DIAGNOSIS — Z5181 Encounter for therapeutic drug level monitoring: Secondary | ICD-10-CM

## 2015-04-10 DIAGNOSIS — I4891 Unspecified atrial fibrillation: Secondary | ICD-10-CM

## 2015-04-10 LAB — POCT INR: INR: 1.3

## 2015-04-15 ENCOUNTER — Ambulatory Visit (INDEPENDENT_AMBULATORY_CARE_PROVIDER_SITE_OTHER): Payer: Medicaid Other | Admitting: *Deleted

## 2015-04-15 ENCOUNTER — Other Ambulatory Visit (HOSPITAL_COMMUNITY)
Admission: RE | Admit: 2015-04-15 | Discharge: 2015-04-15 | Disposition: A | Discharge status: Home / Self Care | Payer: Medicaid Other | Source: Ambulatory Visit | Attending: Cardiology | Admitting: Cardiology

## 2015-04-15 ENCOUNTER — Telehealth: Payer: Self-pay | Admitting: *Deleted

## 2015-04-15 DIAGNOSIS — I4891 Unspecified atrial fibrillation: Secondary | ICD-10-CM

## 2015-04-15 DIAGNOSIS — Z79899 Other long term (current) drug therapy: Secondary | ICD-10-CM | POA: Insufficient documentation

## 2015-04-15 DIAGNOSIS — Z5181 Encounter for therapeutic drug level monitoring: Secondary | ICD-10-CM | POA: Diagnosis not present

## 2015-04-15 LAB — CBC WITH DIFFERENTIAL/PLATELET
Basophils Absolute: 0 10*3/uL (ref 0.0–0.1)
Basophils Relative: 0 %
EOS ABS: 0.1 10*3/uL (ref 0.0–0.7)
Eosinophils Relative: 1 %
HCT: 44.1 % (ref 39.0–52.0)
HEMOGLOBIN: 14.2 g/dL (ref 13.0–17.0)
LYMPHS ABS: 2.5 10*3/uL (ref 0.7–4.0)
LYMPHS PCT: 28 %
MCH: 29.2 pg (ref 26.0–34.0)
MCHC: 32.2 g/dL (ref 30.0–36.0)
MCV: 90.7 fL (ref 78.0–100.0)
Monocytes Absolute: 0.9 10*3/uL (ref 0.1–1.0)
Monocytes Relative: 10 %
NEUTROS ABS: 5.4 10*3/uL (ref 1.7–7.7)
NEUTROS PCT: 61 %
Platelets: 172 10*3/uL (ref 150–400)
RBC: 4.86 MIL/uL (ref 4.22–5.81)
RDW: 13.8 % (ref 11.5–15.5)
WBC: 8.9 10*3/uL (ref 4.0–10.5)

## 2015-04-15 LAB — BASIC METABOLIC PANEL
Anion gap: 7 (ref 5–15)
BUN: 17 mg/dL (ref 6–20)
CHLORIDE: 97 mmol/L — AB (ref 101–111)
CO2: 35 mmol/L — ABNORMAL HIGH (ref 22–32)
Calcium: 8.9 mg/dL (ref 8.9–10.3)
Creatinine, Ser: 1.18 mg/dL (ref 0.61–1.24)
GFR calc Af Amer: >60 mL/min (ref >60)
GFR calc non Af Amer: >60 mL/min (ref >60)
Glucose, Bld: 294 mg/dL — ABNORMAL HIGH (ref 65–99)
POTASSIUM: 4.1 mmol/L (ref 3.5–5.1)
SODIUM: 139 mmol/L (ref 135–145)

## 2015-04-15 MED ORDER — POTASSIUM CHLORIDE 20 MEQ PO PACK: 20.0000 meq | PACK | Freq: Every day | ORAL | Status: DC

## 2015-04-15 MED ORDER — RIVAROXABAN 20 MG PO TABS: 20.0000 mg | ORAL_TABLET | Freq: Every day | ORAL | Status: DC

## 2015-04-15 NOTE — Telephone Encounter (Signed)
Patient in for a coumadin visit today. Pt brought all meds he is currently taking. Pt has both Cardizem 180 mg and 240 mg, Lasix 40 mg, does not have Toprol XL 100 mg or Coumadin. Pt does have Xarelto 20 mg.  Patient assisted with filling pill dispencer in office. Pt informed not to have Coumadin filled due to being on Xarelto. Pharmacy notified that Coumadin has been D/C'd. Refill called in for Klor-Con. Pt instructed to take 1/2 tablet of Lasix daily. Patient voiced understanding. Will forward to Dr.Branch.

## 2015-04-16 ENCOUNTER — Telehealth: Payer: Self-pay | Admitting: *Deleted

## 2015-04-16 DIAGNOSIS — R7309 Other abnormal glucose: Secondary | ICD-10-CM

## 2015-04-16 NOTE — Telephone Encounter (Signed)
-----   Message from Arnoldo Lenis, MD sent at 04/16/2015 10:19 AM EST ----- Labs look ok other than high blood sugar, was the patient fasting when labs were done?   Zandra Abts MD

## 2015-04-16 NOTE — Telephone Encounter (Signed)
Patient states he was fasting at the time of the test.

## 2015-04-16 NOTE — Telephone Encounter (Signed)
I would have patient just take his dilt 240mg  once daily, not the 180mg  in addition. We will discuss further at our f/u   Zandra Abts MD

## 2015-04-16 NOTE — Telephone Encounter (Signed)
Spoke to pt, let him know to only take 240 mg of cardizem daily and not the 180 mg together.

## 2015-04-17 NOTE — Addendum Note (Signed)
Addended by: Levonne Hubert on: 04/17/2015 04:40 PM   Modules accepted: Orders

## 2015-04-17 NOTE — Telephone Encounter (Signed)
Can we add a HgbA1c on to that sample  Zandra Abts MD

## 2015-04-18 LAB — HEMOGLOBIN A1C
HEMOGLOBIN A1C: 9.6 % — AB (ref 4.8–5.6)
Mean Plasma Glucose: 229 mg/dL

## 2015-04-19 ENCOUNTER — Telehealth: Payer: Self-pay | Admitting: Cardiology

## 2015-04-19 NOTE — Telephone Encounter (Signed)
Returned pt phone call, but no answer. Left voicemail.

## 2015-04-19 NOTE — Telephone Encounter (Signed)
Pt was returning a call concerning his Labs

## 2015-04-22 NOTE — Telephone Encounter (Signed)
Order placed for A1 c ,pt will have to get lab drawn

## 2015-04-22 NOTE — Telephone Encounter (Signed)
Lm for pt to call,needs A1C

## 2015-04-22 NOTE — Telephone Encounter (Signed)
Patient ate little debbie cakes that am

## 2015-04-22 NOTE — Addendum Note (Signed)
Addended by: Barbarann Ehlers A on: 04/22/2015 02:20 PM   Modules accepted: Orders

## 2015-04-22 NOTE — Telephone Encounter (Signed)
HgbA1c is not affected by eating the same day, it is a measure of blood sugars over 3 month period. He has diabetes based on test results and needs to f/u with his primary   J Natalio Salois MD

## 2015-04-23 NOTE — Telephone Encounter (Signed)
A1c already done,forwarde to pt,pt never returned call

## 2015-05-03 ENCOUNTER — Ambulatory Visit (INDEPENDENT_AMBULATORY_CARE_PROVIDER_SITE_OTHER): Payer: Medicaid Other | Admitting: Cardiology

## 2015-05-03 ENCOUNTER — Encounter: Payer: Self-pay | Admitting: Cardiology

## 2015-05-03 VITALS — BP 138/84 | HR 88 | Ht 71.0 in | Wt 292.0 lb

## 2015-05-03 DIAGNOSIS — I5022 Chronic systolic (congestive) heart failure: Secondary | ICD-10-CM | POA: Diagnosis not present

## 2015-05-03 DIAGNOSIS — I4891 Unspecified atrial fibrillation: Secondary | ICD-10-CM | POA: Diagnosis not present

## 2015-05-03 DIAGNOSIS — I340 Nonrheumatic mitral (valve) insufficiency: Secondary | ICD-10-CM | POA: Diagnosis not present

## 2015-05-03 MED ORDER — METOPROLOL SUCCINATE ER 100 MG PO TB24: 100.0000 mg | ORAL_TABLET | Freq: Every day | ORAL | Status: DC

## 2015-05-03 MED ORDER — METFORMIN HCL 500 MG PO TABS: 500.0000 mg | ORAL_TABLET | Freq: Two times a day (BID) | ORAL | Status: DC

## 2015-05-03 MED ORDER — METOPROLOL SUCCINATE ER 50 MG PO TB24: 50.0000 mg | ORAL_TABLET | Freq: Every day | ORAL | Status: DC

## 2015-05-03 MED ORDER — FUROSEMIDE 40 MG PO TABS: 40.0000 mg | ORAL_TABLET | Freq: Every day | ORAL | Status: DC

## 2015-05-03 NOTE — Progress Notes (Signed)
Patient ID: Dennis Swanson, male   DOB: 08/12/1955, 60 y.o.   MRN: [id number]     Clinical Summary Dennis Swanson is a 60 y.o.male last seen by PA Lenze, this is our first visit together.   1. Afib - compliant with meds. Denies any palpitations, denies any bleeding troubles on xarelto.   2. Chronic systolic HF - echo [id number] LVEF 40-45% - occas LE edema. Stable DOE at 1 block. No orthopnea or PND.    3. Mitral regurgitation - mild to mod by echo 2011   Past Medical History  Diagnosis Date  . CHF (congestive heart failure) ([place])   . Systolic dysfunction   . Acute pulmonary embolism ([place]) 2011  . Hypertension   . Alcohol abuse   . Lower extremity cellulitis     bilateral  . Atrial flutter ([facility])      No Known Allergies   Current Outpatient Prescriptions  Medication Sig Dispense Refill  . albuterol (PROVENTIL HFA;VENTOLIN HFA) 108 (90 Base) MCG/ACT inhaler Inhale 1-2 puffs into the lungs every 6 (six) hours as needed for wheezing or shortness of breath. 1 Inhaler 0  . atorvastatin (LIPITOR) 40 MG tablet Take 40 mg by mouth daily at 6 PM.    . digoxin (LANOXIN) 0.25 MG tablet Take 1 tablet (0.25 mg total) by mouth daily. 30 tablet 6  . diltiazem (CARDIZEM CD) 240 MG 24 hr capsule Take 1 capsule (240 mg total) by mouth daily. 90 capsule 3  . furosemide (LASIX) 20 MG tablet Take 20 mg by mouth daily.    Marland Kitchen lisinopril (PRINIVIL,ZESTRIL) 20 MG tablet Take 20 mg by mouth daily.    . metoprolol (TOPROL-XL) 100 MG 24 hr tablet Take 100 mg by mouth daily.      . potassium chloride (KLOR-CON) 20 MEQ packet Take 20 mEq by mouth daily. 30 tablet 11  . rivaroxaban (XARELTO) 20 MG TABS tablet Take 1 tablet (20 mg total) by mouth daily with supper. 30 tablet 11   No current facility-administered medications for this visit.     Past Surgical History  Procedure Laterality Date  . None    . Colonoscopy N/A 05/17/2012    Procedure: COLONOSCOPY;  Surgeon: Danie Binder, MD;  [facility];  Service: Endoscopy;  Laterality: N/A;  10:30     No Known Allergies    Family History  Problem Relation Age of Onset  . Heart attack Mother   . Hypertension Brother   . Diabetes Brother   . Colon cancer Neg Hx      Social History Dennis Swanson reports that he has never smoked. He does not have any smokeless tobacco history on file. Dennis Swanson reports that he drinks about 1.8 oz of alcohol per week.   Review of Systems CONSTITUTIONAL: No weight loss, fever, chills, weakness or fatigue.  HEENT: Eyes: No visual loss, blurred vision, double vision or yellow sclerae.No hearing loss, sneezing, congestion, runny nose or sore throat.  SKIN: No rash or itching.  CARDIOVASCULAR: per HPI RESPIRATORY: No shortness of breath, cough or sputum.  GASTROINTESTINAL: No anorexia, nausea, vomiting or diarrhea. No abdominal pain or blood.  GENITOURINARY: No burning on urination, no polyuria NEUROLOGICAL: No headache, dizziness, syncope, paralysis, ataxia, numbness or tingling in the extremities. No change in bowel or bladder control.  MUSCULOSKELETAL: No muscle, back pain, joint pain or stiffness.  LYMPHATICS: No enlarged nodes. No history of splenectomy.  PSYCHIATRIC: No history of depression or anxiety.  ENDOCRINOLOGIC: No  reports of sweating, cold or heat intolerance. No polyuria or polydipsia.  Marland Kitchen   Physical Examination Filed Vitals:   05/03/15 0915  BP: 138/84  Pulse: 88   Filed Vitals:   05/03/15 0915  Height: 5\' 11"  (1.803 m)  Weight: 292 lb (132.45 kg)    Gen: resting comfortably, no acute distress HEENT: no scleral icterus, pupils equal round and reactive, no palptable cervical adenopathy,  CV: RRR, no m/r/g, no jvd Resp: Clear to auscultation bilaterally GI: abdomen is soft, non-tender, non-distended, normal bowel sounds, no hepatosplenomegaly MSK: extremities are warm, no edema.  Skin: warm, no rash Neuro:  no focal deficits Psych: appropriate  affect     Assessment and Plan  1. Afib - no current symptoms - we will d/c his digoxin to simplyify his regiment, increase Toprol XL to 150mg  daily  2. Chronic systolic HF - increase Toprol XL to 150mg  daily - last echo 2011, will repeat study. If persistent dysfunciton will need further medication titration.  - continue other meds  3. Mitral regurgitation - repeat echo      Arnoldo Lenis, M.D.

## 2015-05-03 NOTE — Patient Instructions (Addendum)
Your physician recommends that you schedule a follow-up appointment in:  1 month  Your physician has requested that you have an echocardiogram. Echocardiography is a painless test that uses sound waves to create images of your heart. It provides your doctor with information about the size and shape of your heart and how well your heart's chambers and valves are working. This procedure takes approximately one hour. There are no restrictions for this procedure.    START Metformin 500 mg twice a day   INCREASE Toprol to 150 mg daily   STOP Digoxin   INCREASE Lasix to 40 mg  Daily   Your physician recommends that you return for lab work in: 2 weeks BMET     Thank you for choosing [facility] !

## 2015-05-06 ENCOUNTER — Ambulatory Visit (HOSPITAL_COMMUNITY)
Admission: RE | Admit: 2015-05-06 | Discharge: 2015-05-06 | Disposition: A | Discharge status: Home / Self Care | Payer: Medicaid Other | Source: Ambulatory Visit | Attending: Cardiology | Admitting: Cardiology

## 2015-05-06 DIAGNOSIS — F1010 Alcohol abuse, uncomplicated: Secondary | ICD-10-CM | POA: Diagnosis not present

## 2015-05-06 DIAGNOSIS — I10 Essential (primary) hypertension: Secondary | ICD-10-CM | POA: Insufficient documentation

## 2015-05-06 DIAGNOSIS — I5022 Chronic systolic (congestive) heart failure: Secondary | ICD-10-CM | POA: Insufficient documentation

## 2015-05-06 DIAGNOSIS — I4891 Unspecified atrial fibrillation: Secondary | ICD-10-CM | POA: Diagnosis not present

## 2015-05-08 NOTE — Telephone Encounter (Signed)
Could not leave a voicemail, no answer. Will try again later.

## 2015-05-08 NOTE — Telephone Encounter (Signed)
-----   Message from Arnoldo Lenis, MD sent at 05/07/2015  3:05 PM EDT ----- Echo shows heart function has normalized since last check. We will discuss further at his f/u  J BrancH MD

## 2015-05-09 NOTE — Telephone Encounter (Signed)
-----   Message from Arnoldo Lenis, MD sent at 05/07/2015  3:05 PM EDT ----- Echo shows heart function has normalized since last check. We will discuss further at his f/u  J BrancH MD

## 2015-05-09 NOTE — Telephone Encounter (Signed)
Have tried to reach pt with several attempts. Sent pt a letter to call us.

## 2015-06-03 ENCOUNTER — Ambulatory Visit: Payer: Medicaid Other | Admitting: Adult Health

## 2015-06-06 ENCOUNTER — Encounter: Payer: Self-pay | Admitting: Cardiology

## 2015-06-06 ENCOUNTER — Ambulatory Visit (INDEPENDENT_AMBULATORY_CARE_PROVIDER_SITE_OTHER): Payer: Medicaid Other | Admitting: Cardiology

## 2015-06-06 VITALS — BP 142/88 | HR 65 | Ht 71.0 in | Wt 291.0 lb

## 2015-06-06 DIAGNOSIS — I340 Nonrheumatic mitral (valve) insufficiency: Secondary | ICD-10-CM

## 2015-06-06 DIAGNOSIS — I4891 Unspecified atrial fibrillation: Secondary | ICD-10-CM | POA: Diagnosis not present

## 2015-06-06 DIAGNOSIS — I10 Essential (primary) hypertension: Secondary | ICD-10-CM

## 2015-06-06 DIAGNOSIS — I5022 Chronic systolic (congestive) heart failure: Secondary | ICD-10-CM

## 2015-06-06 LAB — BASIC METABOLIC PANEL
BUN: 16 mg/dL (ref 7–25)
CALCIUM: 9.7 mg/dL (ref 8.6–10.3)
CHLORIDE: 102 mmol/L (ref 98–110)
CO2: 29 mmol/L (ref 20–31)
Creat: 1.17 mg/dL (ref 0.70–1.25)
Glucose, Bld: 104 mg/dL — ABNORMAL HIGH (ref 65–99)
POTASSIUM: 4.4 mmol/L (ref 3.5–5.3)
SODIUM: 141 mmol/L (ref 135–146)

## 2015-06-06 MED ORDER — LISINOPRIL 40 MG PO TABS: 40.0000 mg | ORAL_TABLET | Freq: Every day | ORAL | Status: DC

## 2015-06-06 NOTE — Progress Notes (Signed)
Patient ID: Dennis Swanson, male   DOB: 09/24/1955, 60 y.o.   MRN: [id number]     Clinical Summary Mr. Hankinson is a 60 y.o.male seen today for follow up of the following medical problems.   1. Afib Denies any palpitations since last visit, denies any bleeding on issues on anticoagulation with xarelto.   2. Chronic systolic HF - echo [id number] LVEF 40-45% - 04/2015 LVEF [id number], grade I diastolic dysfunction.  - denies any recent LE edema, SOB or DOE   3. Mitral regurgitation - mild to mod by echo 2011 - repeat echo 04/2015 MR is mild.  - no recent symptoms  4. HTN - compliant with meds Past Medical History  Diagnosis Date  . CHF (congestive heart failure) ([place])   . Systolic dysfunction   . Acute pulmonary embolism ([place]) 2011  . Hypertension   . Alcohol abuse   . Lower extremity cellulitis     bilateral  . Atrial flutter ([facility])      No Known Allergies   Current Outpatient Prescriptions  Medication Sig Dispense Refill  . albuterol (PROVENTIL HFA;VENTOLIN HFA) 108 (90 Base) MCG/ACT inhaler Inhale 1-2 puffs into the lungs every 6 (six) hours as needed for wheezing or shortness of breath. 1 Inhaler 0  . atorvastatin (LIPITOR) 40 MG tablet Take 40 mg by mouth daily at 6 PM.    . diltiazem (CARDIZEM CD) 240 MG 24 hr capsule Take 1 capsule (240 mg total) by mouth daily. 90 capsule 3  . furosemide (LASIX) 40 MG tablet Take 1 tablet (40 mg total) by mouth daily. 90 tablet 3  . lisinopril (PRINIVIL,ZESTRIL) 20 MG tablet Take 20 mg by mouth daily.    . metFORMIN (GLUCOPHAGE) 500 MG tablet Take 1 tablet (500 mg total) by mouth 2 (two) times daily with a meal. 180 tablet 3  . metoprolol succinate (TOPROL-XL) 100 MG 24 hr tablet Take 1 tablet (100 mg total) by mouth daily. Take with or immediately following a meal. 90 tablet 3  . metoprolol succinate (TOPROL-XL) 50 MG 24 hr tablet Take 1 tablet (50 mg total) by mouth daily. Take with or immediately following a meal. 90 tablet 3  .  potassium chloride (KLOR-CON) 20 MEQ packet Take 20 mEq by mouth daily. 30 tablet 11  . rivaroxaban (XARELTO) 20 MG TABS tablet Take 1 tablet (20 mg total) by mouth daily with supper. 30 tablet 11   No current facility-administered medications for this visit.     Past Surgical History  Procedure Laterality Date  . None    . Colonoscopy N/A 05/17/2012    Procedure: COLONOSCOPY;  Surgeon: Danie Binder, MD;  [facility];  Service: Endoscopy;  Laterality: N/A;  10:30     No Known Allergies    Family History  Problem Relation Age of Onset  . Heart attack Mother   . Hypertension Brother   . Diabetes Brother   . Colon cancer Neg Hx      Social History Mr. Gauntlett reports that he has never smoked. He does not have any smokeless tobacco history on file. Mr. Sunderman reports that he drinks about 1.8 oz of alcohol per week.   Review of Systems CONSTITUTIONAL: No weight loss, fever, chills, weakness or fatigue.  HEENT: Eyes: No visual loss, blurred vision, double vision or yellow sclerae.No hearing loss, sneezing, congestion, runny nose or sore throat.  SKIN: No rash or itching.  CARDIOVASCULAR: per HPI RESPIRATORY: No shortness of breath, cough or sputum.  GASTROINTESTINAL: No anorexia, nausea, vomiting or diarrhea. No abdominal pain or blood.  GENITOURINARY: No burning on urination, no polyuria NEUROLOGICAL: No headache, dizziness, syncope, paralysis, ataxia, numbness or tingling in the extremities. No change in bowel or bladder control.  MUSCULOSKELETAL: No muscle, back pain, joint pain or stiffness.  LYMPHATICS: No enlarged nodes. No history of splenectomy.  PSYCHIATRIC: No history of depression or anxiety.  ENDOCRINOLOGIC: No reports of sweating, cold or heat intolerance. No polyuria or polydipsia.  Marland Kitchen   Physical Examination Filed Vitals:   06/06/15 1159  BP: 142/88  Pulse: 65   Filed Vitals:   06/06/15 1159  Height: 5\' 11"  (1.803 m)  Weight: 291 lb  (131.997 kg)    Gen: resting comfortably, no acute distress HEENT: no scleral icterus, pupils equal round and reactive, no palptable cervical adenopathy,  CV: RRR, no m/r/g no jvd Resp: Clear to auscultation bilaterally GI: abdomen is soft, non-tender, non-distended, normal bowel sounds, no hepatosplenomegaly MSK: extremities are warm, no edema.  Skin: warm, no rash Neuro:  no focal deficits Psych: appropriate affect   Diagnostic Studies Study Conclusions  - Left ventricle: The cavity size was normal. Wall thickness was  increased in a pattern of moderate LVH. Systolic function was  normal. The estimated ejection fraction was in the range of 60%  to 65%. Doppler parameters are consistent with abnormal left  ventricular relaxation (grade 1 diastolic dysfunction). - Aortic valve: Mildly calcified annulus. Trileaflet; mildly  thickened leaflets. Valve area (VTI): 2.5 cm^2. Valve area  (Vmax): 2.38 cm^2. - Mitral valve: Mildly calcified annulus. Mildly thickened leaflets  . There was mild regurgitation. - Technically adequate study.    Assessment and Plan   1. Afib - no current symptoms - continue current meds  2. Chronic systolic HF - LVEF has normalized, no current symptoms. Continue current meds  3. Mitral regurgitation - mild by recent echo, continue to monitor  4. HTN - above goal given his history of DM2, goal <130/80. We will increase lisionpril to 40mg  daily and repeat BMET in 2 weeks.    F/u 6 months  Arnoldo Lenis, M.D.

## 2015-06-06 NOTE — Patient Instructions (Addendum)
Your physician wants you to follow-up in: 6 months with Dr Bryna Colander will receive a reminder letter in the mail two months in advance. If you don't receive a letter, please call our office to schedule the follow-up appointment.   INCREASE Lisinopril to 40 mg daily  Get Lab work in 2 weeks   Thank you for choosing [facility] !

## 2015-06-11 ENCOUNTER — Telehealth: Payer: Self-pay

## 2015-06-11 NOTE — Telephone Encounter (Signed)
-----   Message from Arnoldo Lenis, MD sent at 06/11/2015  8:51 AM EDT ----- Labs look good  Zandra Abts MD

## 2015-06-11 NOTE — Telephone Encounter (Signed)
Called pt, left message for pt to return call. 4/24- lm

## 2015-07-02 ENCOUNTER — Ambulatory Visit (INDEPENDENT_AMBULATORY_CARE_PROVIDER_SITE_OTHER): Payer: Medicaid Other | Admitting: Cardiology

## 2015-07-02 ENCOUNTER — Encounter: Payer: Self-pay | Admitting: Cardiology

## 2015-07-02 VITALS — BP 140/84 | HR 89 | Ht 71.0 in | Wt 293.0 lb

## 2015-07-02 DIAGNOSIS — Z7901 Long term (current) use of anticoagulants: Secondary | ICD-10-CM

## 2015-07-02 DIAGNOSIS — I482 Chronic atrial fibrillation, unspecified: Secondary | ICD-10-CM

## 2015-07-02 NOTE — Progress Notes (Signed)
Cardiology Office Note   Date:  07/02/2015   ID:  Dennis Swanson, DOB December 02, 1955, MRN [id number]  PCP:  Rosita Fire, MD  Cardiologist:  Dr. Harl Bowie   Chief Complaint  Patient presents with  . Atrial Fibrillation    needs letter for dental extraction on Xarelto      History of Present Illness: Dennis Swanson is a 60 y.o. male who presents for instructions for dental procedure.   Was just seen by Dr. Harl Bowie less than 1 month ago.  He has a history of a fib on anticoagulation with xarelto, chronic systolic HF with current [id number], MR mild, and HTN controlled.  Now needs instructions for holing xarelto for extraction of 6 teeth.   No complaints otherwise.     Past Medical History  Diagnosis Date  . CHF (congestive heart failure) ([place])   . Systolic dysfunction   . Acute pulmonary embolism ([place]) 2011  . Hypertension   . Alcohol abuse   . Lower extremity cellulitis     bilateral  . Atrial flutter [facility])     Past Surgical History  Procedure Laterality Date  . None    . Colonoscopy N/A 05/17/2012    Procedure: COLONOSCOPY;  Surgeon: Danie Binder, MD;  [facility];  Service: Endoscopy;  Laterality: N/A;  10:30     Current Outpatient Prescriptions  Medication Sig Dispense Refill  . albuterol (PROVENTIL HFA;VENTOLIN HFA) 108 (90 Base) MCG/ACT inhaler Inhale 1-2 puffs into the lungs every 6 (six) hours as needed for wheezing or shortness of breath. 1 Inhaler 0  . atorvastatin (LIPITOR) 40 MG tablet Take 40 mg by mouth daily at 6 PM.    . diltiazem (CARDIZEM CD) 240 MG 24 hr capsule Take 1 capsule (240 mg total) by mouth daily. 90 capsule 3  . furosemide (LASIX) 40 MG tablet Take 1 tablet (40 mg total) by mouth daily. 90 tablet 3  . lisinopril (PRINIVIL,ZESTRIL) 40 MG tablet Take 1 tablet (40 mg total) by mouth daily. 90 tablet 3  . metFORMIN (GLUCOPHAGE) 500 MG tablet Take 1 tablet (500 mg total) by mouth 2 (two) times daily with a meal. 180 tablet 3  .  metoprolol succinate (TOPROL-XL) 100 MG 24 hr tablet Take 1 tablet (100 mg total) by mouth daily. Take with or immediately following a meal. 90 tablet 3  . metoprolol succinate (TOPROL-XL) 50 MG 24 hr tablet Take 1 tablet (50 mg total) by mouth daily. Take with or immediately following a meal. 90 tablet 3  . potassium chloride (KLOR-CON) 20 MEQ packet Take 20 mEq by mouth daily. 30 tablet 11  . rivaroxaban (XARELTO) 20 MG TABS tablet Take 1 tablet (20 mg total) by mouth daily with supper. 30 tablet 11   No current facility-administered medications for this visit.    Allergies:   Review of patient's allergies indicates no known allergies.      ROS:  General:no colds or fevers, no weight changes CV:see HPI PUL:see HPI  Wt Readings from Last 3 Encounters:  07/02/15 293 lb (132.904 kg)  06/06/15 291 lb (131.997 kg)  05/03/15 292 lb (132.45 kg)     PHYSICAL EXAM: VS:  BP 140/84 mmHg  Pulse 89  Ht 5\' 11"  (1.803 m)  Wt 293 lb (132.904 kg)  BMI 40.88 kg/m2  SpO2 92% , BMI Body mass index is 40.88 kg/(m^2). General:Pleasant affect, NAD Heart:S1S2 RRR without murmur, gallup, rub or click Lungs:clear without rales, rhonchi, or wheezes  EKG:  EKG is NOT ordered today.   Recent Labs: 04/15/2015: Hemoglobin 14.2; Platelets 172 06/06/2015: BUN 16; Creat 1.17; Potassium 4.4; Sodium 141    Lipid Panel    Component Value Date/Time   CHOL  08/21/2009 0552    143        ATP III CLASSIFICATION:  <200     mg/dL   Desirable  200-239  mg/dL   Borderline High  >=240    mg/dL   High          TRIG 66 08/21/2009 0552   HDL 31* 08/21/2009 0552   CHOLHDL 4.6 08/21/2009 0552   VLDL 13 08/21/2009 0552   LDLCALC  08/21/2009 0552    99        Total Cholesterol/HDL:CHD Risk Coronary Heart Disease Risk Table                     Men   Women  1/2 Average Risk   3.4   3.3  Average Risk       5.0   4.4  2 X Average Risk   9.6   7.1  3 X Average Risk  23.4   11.0        Use the calculated  Patient Ratio above and the CHD Risk Table to determine the patient's CHD Risk.        ATP III CLASSIFICATION (LDL):  <100     mg/dL   Optimal  100-129  mg/dL   Near or Above                    Optimal  130-159  mg/dL   Borderline  160-189  mg/dL   High  >190     mg/dL   Very High       Other studies Reviewed: Additional studies/ records that were reviewed today include: previous notes   ASSESSMENT AND PLAN:  1.  Need for dental extraction X 6 teeth,  Discussed with DR. Branch.  Hold Xarelto 2 days before procedure and resume the day after procedure.  2. Chronic a fib on anticoagulation with Xarelto  Keep previous follow up appt with Dr. Harl Bowie     Current medicines are reviewed with the patient today.  The patient Has no concerns regarding medicines.  The following changes have been made:  See above Labs/ tests ordered today include:see above  Disposition:   FU:  see above  Signed, Cecilie Kicks, NP  07/02/2015 4:28 PM    [address], [place], [place], [place] Phone: [phone]; Fax: [phone]

## 2015-07-02 NOTE — Patient Instructions (Addendum)
For teeth removal, hold the Xarelto 2 days before the procedure and resume the day after the procedure.   Your dentist will direct on the days.  Please give her a copy of this.    Your physician recommends that you schedule a follow-up appointment with Dr. Harl Bowie as scheduled.   Your physician recommends that you continue on your current medications as directed. Please refer to the Current Medication list given to you today.  If you need a refill on your cardiac medications before your next appointment, please call your pharmacy.  Thank you for choosing [facility]!

## 2015-07-08 ENCOUNTER — Ambulatory Visit (INDEPENDENT_AMBULATORY_CARE_PROVIDER_SITE_OTHER): Payer: Medicaid Other | Admitting: *Deleted

## 2015-07-08 DIAGNOSIS — Z5181 Encounter for therapeutic drug level monitoring: Secondary | ICD-10-CM

## 2015-07-08 DIAGNOSIS — I4891 Unspecified atrial fibrillation: Secondary | ICD-10-CM

## 2015-07-08 LAB — BASIC METABOLIC PANEL
BUN: 16 mg/dL (ref 7–25)
CALCIUM: 8.9 mg/dL (ref 8.6–10.3)
CHLORIDE: 102 mmol/L (ref 98–110)
CO2: 34 mmol/L — AB (ref 20–31)
CREATININE: 1.28 mg/dL — AB (ref 0.70–1.25)
Glucose, Bld: 114 mg/dL — ABNORMAL HIGH (ref 65–99)
Potassium: 4.3 mmol/L (ref 3.5–5.3)
Sodium: 140 mmol/L (ref 135–146)

## 2015-07-08 LAB — CBC
HCT: 42.8 % (ref 38.5–50.0)
Hemoglobin: 13.7 g/dL (ref 13.2–17.1)
MCH: 29.1 pg (ref 27.0–33.0)
MCHC: 32 g/dL (ref 32.0–36.0)
MCV: 91.1 fL (ref 80.0–100.0)
MPV: 11.4 fL (ref 7.5–12.5)
PLATELETS: 187 10*3/uL (ref 140–400)
RBC: 4.7 MIL/uL (ref 4.20–5.80)
RDW: 14.7 % (ref 11.0–15.0)
WBC: 7.4 10*3/uL (ref 3.8–10.8)

## 2015-07-08 NOTE — Progress Notes (Signed)
Pt was started on Xarelto 20mg  daily for atrial fibrillation 2/17 by Dr Harl Bowie.    Pt denies problems taking Xarelto.  States he had not had any bleeding, excessive bruising or GI upset.  Reviewed patients medication list.  Pt is not currently on any combined P-gp and strong CYP3A4 inhibitors/inducers (ketoconazole, traconazole, ritonavir, carbamazepine, phenytoin, rifampin, St. John's wort).  Reviewed labs from 07/08/15 @ [facility].  SCr 1.28, Weight 293, CrCl 113.92 .  Dose is appropriate based on CrCl.   Hgb and HCT: 13.7/42.8  A full discussion of the nature of anticoagulants has been carried out.  A benefit/risk analysis has been presented to the patient, so that they understand the justification for choosing anticoagulation with Xarelto at this time.  The need for compliance is stressed.  Pt is aware to take the medication once daily with the largest meal of the day.  Side effects of potential bleeding are discussed, including unusual colored urine or stools, coughing up blood or coffee ground emesis, nose bleeds or serious fall or head trauma.  Discussed signs and symptoms of stroke. The patient should avoid any OTC items containing aspirin or ibuprofen.  Avoid alcohol consumption.   Call if any signs of abnormal bleeding.  Discussed financial obligations and resolved any difficulty in obtaining medication.  Next lab test test in 6 months.   Discussed lab work with pt.  Told him to drink more water.  F/U appt made for 01/15/16

## 2015-11-27 ENCOUNTER — Other Ambulatory Visit: Payer: Self-pay | Admitting: *Deleted

## 2015-11-27 MED ORDER — FUROSEMIDE 40 MG PO TABS: 40.0000 mg | ORAL_TABLET | Freq: Every day | ORAL | 3 refills | Status: DC

## 2015-12-02 ENCOUNTER — Other Ambulatory Visit: Payer: Self-pay

## 2015-12-02 MED ORDER — RIVAROXABAN 20 MG PO TABS: 20.0000 mg | ORAL_TABLET | Freq: Every day | ORAL | 0 refills | Status: DC

## 2015-12-02 MED ORDER — DILTIAZEM HCL ER COATED BEADS 240 MG PO CP24: 240.0000 mg | ORAL_CAPSULE | Freq: Every day | ORAL | 0 refills | Status: DC

## 2015-12-02 MED ORDER — LISINOPRIL 40 MG PO TABS: 40.0000 mg | ORAL_TABLET | Freq: Every day | ORAL | 0 refills | Status: DC

## 2015-12-02 MED ORDER — POTASSIUM CHLORIDE 20 MEQ PO PACK: 20.0000 meq | PACK | Freq: Every day | ORAL | 11 refills | Status: DC

## 2015-12-02 MED ORDER — ATORVASTATIN CALCIUM 40 MG PO TABS: 40.0000 mg | ORAL_TABLET | Freq: Every day | ORAL | 0 refills | Status: DC

## 2015-12-02 NOTE — Telephone Encounter (Signed)
Refill complete 

## 2015-12-06 ENCOUNTER — Emergency Department (HOSPITAL_COMMUNITY): Payer: Medicaid Other

## 2015-12-06 ENCOUNTER — Inpatient Hospital Stay (HOSPITAL_COMMUNITY)
Admission: EM | Admit: 2015-12-06 | Discharge: 2015-12-08 | DRG: 292 | Disposition: A | Discharge status: Home / Self Care | Payer: Medicaid Other | Attending: Internal Medicine | Admitting: Internal Medicine

## 2015-12-06 ENCOUNTER — Encounter (HOSPITAL_COMMUNITY): Payer: Self-pay

## 2015-12-06 DIAGNOSIS — I4891 Unspecified atrial fibrillation: Secondary | ICD-10-CM | POA: Diagnosis present

## 2015-12-06 DIAGNOSIS — E785 Hyperlipidemia, unspecified: Secondary | ICD-10-CM | POA: Diagnosis present

## 2015-12-06 DIAGNOSIS — Z8249 Family history of ischemic heart disease and other diseases of the circulatory system: Secondary | ICD-10-CM

## 2015-12-06 DIAGNOSIS — Z86711 Personal history of pulmonary embolism: Secondary | ICD-10-CM | POA: Diagnosis not present

## 2015-12-06 DIAGNOSIS — R0902 Hypoxemia: Secondary | ICD-10-CM | POA: Diagnosis present

## 2015-12-06 DIAGNOSIS — I4892 Unspecified atrial flutter: Secondary | ICD-10-CM

## 2015-12-06 DIAGNOSIS — I5033 Acute on chronic diastolic (congestive) heart failure: Secondary | ICD-10-CM | POA: Diagnosis present

## 2015-12-06 DIAGNOSIS — I110 Hypertensive heart disease with heart failure: Secondary | ICD-10-CM | POA: Diagnosis present

## 2015-12-06 DIAGNOSIS — Z7984 Long term (current) use of oral hypoglycemic drugs: Secondary | ICD-10-CM | POA: Diagnosis not present

## 2015-12-06 DIAGNOSIS — Z7901 Long term (current) use of anticoagulants: Secondary | ICD-10-CM | POA: Diagnosis not present

## 2015-12-06 DIAGNOSIS — E119 Type 2 diabetes mellitus without complications: Secondary | ICD-10-CM | POA: Diagnosis present

## 2015-12-06 DIAGNOSIS — I509 Heart failure, unspecified: Secondary | ICD-10-CM

## 2015-12-06 DIAGNOSIS — R0602 Shortness of breath: Secondary | ICD-10-CM | POA: Diagnosis not present

## 2015-12-06 DIAGNOSIS — I428 Other cardiomyopathies: Secondary | ICD-10-CM | POA: Diagnosis present

## 2015-12-06 LAB — GLUCOSE, CAPILLARY
GLUCOSE-CAPILLARY: 164 mg/dL — AB (ref 65–99)
GLUCOSE-CAPILLARY: 95 mg/dL (ref 65–99)
Glucose-Capillary: 106 mg/dL — ABNORMAL HIGH (ref 65–99)
Glucose-Capillary: 81 mg/dL (ref 65–99)

## 2015-12-06 LAB — COMPREHENSIVE METABOLIC PANEL
ALT: 39 U/L (ref 17–63)
ANION GAP: 7 (ref 5–15)
AST: 20 U/L (ref 15–41)
Albumin: 3.4 g/dL — ABNORMAL LOW (ref 3.5–5.0)
Alkaline Phosphatase: 47 U/L (ref 38–126)
BILIRUBIN TOTAL: 0.5 mg/dL (ref 0.3–1.2)
BUN: 22 mg/dL — AB (ref 6–20)
CO2: 34 mmol/L — ABNORMAL HIGH (ref 22–32)
Calcium: 8.9 mg/dL (ref 8.9–10.3)
Chloride: 102 mmol/L (ref 101–111)
Creatinine, Ser: 1.12 mg/dL (ref 0.61–1.24)
Glucose, Bld: 126 mg/dL — ABNORMAL HIGH (ref 65–99)
POTASSIUM: 3.9 mmol/L (ref 3.5–5.1)
Sodium: 143 mmol/L (ref 135–145)
TOTAL PROTEIN: 6.3 g/dL — AB (ref 6.5–8.1)

## 2015-12-06 LAB — CBC WITH DIFFERENTIAL/PLATELET
BASOS ABS: 0 10*3/uL (ref 0.0–0.1)
BASOS PCT: 0 %
Eosinophils Absolute: 0.1 10*3/uL (ref 0.0–0.7)
Eosinophils Relative: 1 %
HEMATOCRIT: 48.7 % (ref 39.0–52.0)
Hemoglobin: 15.2 g/dL (ref 13.0–17.0)
Lymphocytes Relative: 24 %
Lymphs Abs: 2.2 10*3/uL (ref 0.7–4.0)
MCH: 30 pg (ref 26.0–34.0)
MCHC: 31.2 g/dL (ref 30.0–36.0)
MCV: 96.2 fL (ref 78.0–100.0)
MONO ABS: 0.7 10*3/uL (ref 0.1–1.0)
Monocytes Relative: 8 %
NEUTROS ABS: 5.9 10*3/uL (ref 1.7–7.7)
Neutrophils Relative %: 67 %
PLATELETS: 145 10*3/uL — AB (ref 150–400)
RBC: 5.06 MIL/uL (ref 4.22–5.81)
RDW: 15.3 % (ref 11.5–15.5)
WBC: 8.9 10*3/uL (ref 4.0–10.5)

## 2015-12-06 LAB — TROPONIN I: Troponin I: <0.03 ng/mL (ref <0.03)

## 2015-12-06 LAB — BRAIN NATRIURETIC PEPTIDE: B NATRIURETIC PEPTIDE 5: 174 pg/mL — AB (ref 0.0–100.0)

## 2015-12-06 MED ORDER — ACETAMINOPHEN 325 MG PO TABS: 650.0000 mg | ORAL_TABLET | ORAL | Status: DC | PRN

## 2015-12-06 MED ORDER — ONDANSETRON HCL 4 MG/2ML IJ SOLN: 4.0000 mg | Freq: Four times a day (QID) | INTRAMUSCULAR | Status: DC | PRN

## 2015-12-06 MED ORDER — INSULIN ASPART 100 UNIT/ML ~~LOC~~ SOLN
0.0000 [IU] | Freq: Three times a day (TID) | SUBCUTANEOUS | Status: DC
  Administered 2015-12-06: 2 [IU] via SUBCUTANEOUS
  Administered 2015-12-07 (×2): 1 [IU] via SUBCUTANEOUS
  Administered 2015-12-08: 2 [IU] via SUBCUTANEOUS

## 2015-12-06 MED ORDER — FUROSEMIDE 10 MG/ML IJ SOLN
20.0000 mg | Freq: Two times a day (BID) | INTRAMUSCULAR | Status: DC
  Administered 2015-12-06 – 2015-12-07 (×3): 20 mg via INTRAVENOUS
  Filled 2015-12-06 (×3): qty 2

## 2015-12-06 MED ORDER — FUROSEMIDE 10 MG/ML IJ SOLN
80.0000 mg | Freq: Once | INTRAMUSCULAR | Status: AC
  Administered 2015-12-06: 80 mg via INTRAVENOUS
  Filled 2015-12-06: qty 8

## 2015-12-06 MED ORDER — ALBUTEROL SULFATE (2.5 MG/3ML) 0.083% IN NEBU: 3.0000 mL | INHALATION_SOLUTION | Freq: Four times a day (QID) | RESPIRATORY_TRACT | Status: DC | PRN

## 2015-12-06 MED ORDER — DILTIAZEM HCL ER COATED BEADS 240 MG PO CP24
240.0000 mg | ORAL_CAPSULE | Freq: Every day | ORAL | Status: DC
  Administered 2015-12-06 – 2015-12-08 (×3): 240 mg via ORAL
  Filled 2015-12-06 (×3): qty 1

## 2015-12-06 MED ORDER — METOPROLOL SUCCINATE ER 50 MG PO TB24
100.0000 mg | ORAL_TABLET | Freq: Every day | ORAL | Status: DC
  Administered 2015-12-06 – 2015-12-08 (×3): 100 mg via ORAL
  Filled 2015-12-06 (×3): qty 2

## 2015-12-06 MED ORDER — LISINOPRIL 10 MG PO TABS
40.0000 mg | ORAL_TABLET | Freq: Every day | ORAL | Status: DC
  Administered 2015-12-06 – 2015-12-08 (×3): 40 mg via ORAL
  Filled 2015-12-06 (×3): qty 4

## 2015-12-06 MED ORDER — SODIUM CHLORIDE 0.9 % IV SOLN: Freq: Once | INTRAVENOUS | Status: DC

## 2015-12-06 MED ORDER — RIVAROXABAN 20 MG PO TABS
20.0000 mg | ORAL_TABLET | Freq: Every day | ORAL | Status: DC
  Administered 2015-12-06 – 2015-12-07 (×2): 20 mg via ORAL
  Filled 2015-12-06 (×2): qty 1

## 2015-12-06 MED ORDER — SODIUM CHLORIDE 0.9 % IV SOLN
250.0000 mL | INTRAVENOUS | Status: DC | PRN
Start: 2015-12-06 — End: 2015-12-08

## 2015-12-06 MED ORDER — POTASSIUM CHLORIDE 20 MEQ PO PACK
20.0000 meq | PACK | Freq: Every day | ORAL | Status: DC
Start: 2015-12-06 — End: 2015-12-07
  Administered 2015-12-06 – 2015-12-07 (×2): 20 meq via ORAL
  Filled 2015-12-06 (×2): qty 1

## 2015-12-06 MED ORDER — SODIUM CHLORIDE 0.9% FLUSH: 3.0000 mL | INTRAVENOUS | Status: DC | PRN

## 2015-12-06 MED ORDER — SODIUM CHLORIDE 0.9% FLUSH
3.0000 mL | Freq: Two times a day (BID) | INTRAVENOUS | Status: DC
  Administered 2015-12-06 – 2015-12-08 (×5): 3 mL via INTRAVENOUS

## 2015-12-06 MED ORDER — NITROGLYCERIN 2 % TD OINT
1.0000 [in_us] | TOPICAL_OINTMENT | Freq: Once | TRANSDERMAL | Status: AC
  Administered 2015-12-06: 1 [in_us] via TOPICAL
  Filled 2015-12-06: qty 1

## 2015-12-06 MED ORDER — ATORVASTATIN CALCIUM 40 MG PO TABS
40.0000 mg | ORAL_TABLET | Freq: Every day | ORAL | Status: DC
  Administered 2015-12-06 – 2015-12-07 (×2): 40 mg via ORAL
  Filled 2015-12-06 (×2): qty 1

## 2015-12-06 NOTE — ED Provider Notes (Signed)
[facility] DEPT Provider Note   CSN: [id number] Arrival date & time: 12/06/15  0144  Time sen 01:55 AM   History   Chief Complaint Chief Complaint  Patient presents with  . Foot Swelling    HPI Dennis Swanson is a 60 y.o. male.  HPI patient reports his feet started swelling up about 4 days ago. He also states he feels like his abdomen is swollen and distended. He states he gets short of breath when he bends over and has some dyspnea on exertion which he states seems to be stable. He denies chest pain or chest tightness. He denies cough or fever. He states his feet usually do not swell up. He states he was diagnosed with congestive heart failure in 2012. He does not use oxygen at home.   PCP Dr Legrand Rams Cardiology Dr Harl Bowie  Past Medical History:  Diagnosis Date  . Acute pulmonary embolism ([place]) 2011  . Alcohol abuse   . Atrial flutter ([place])   . CHF (congestive heart failure) ([place])   . Hypertension   . Lower extremity cellulitis    bilateral  . Systolic dysfunction     Patient Active Problem List   Diagnosis Date Noted  . Encounter for therapeutic drug monitoring 04/10/2015  . Atrial fibrillation ([place]) 04/03/2015  . Nonischemic cardiomyopathy ([place]) 04/03/2015  . Current use of long term anticoagulation 04/03/2015  . Encounter for screening colonoscopy 04/27/2012  . Essential hypertension 04/27/2012  . CHF 12/17/2009  . ALCOHOL ABUSE 09/27/2009    Past Surgical History:  Procedure Laterality Date  . COLONOSCOPY N/A 05/17/2012   Procedure: COLONOSCOPY;  Surgeon: Danie Binder, MD;  Location: [facility];  Service: Endoscopy;  Laterality: N/A;  10:30  . None         Home Medications    Prior to Admission medications   Medication Sig Start Date End Date Taking? Authorizing Provider  albuterol (PROVENTIL HFA;VENTOLIN HFA) 108 (90 Base) MCG/ACT inhaler Inhale 1-2 puffs into the lungs every 6 (six) hours as needed for wheezing or shortness of breath.  04/02/15   Tammy Triplett, PA-C  atorvastatin (LIPITOR) 40 MG tablet Take 1 tablet (40 mg total) by mouth daily at 6 PM. 12/02/15   Arnoldo Lenis, MD  diltiazem (CARDIZEM CD) 240 MG 24 hr capsule Take 1 capsule (240 mg total) by mouth daily. 12/02/15   Arnoldo Lenis, MD  furosemide (LASIX) 40 MG tablet Take 1 tablet (40 mg total) by mouth daily. 11/27/15   Arnoldo Lenis, MD  lisinopril (PRINIVIL,ZESTRIL) 40 MG tablet Take 1 tablet (40 mg total) by mouth daily. 12/02/15   Arnoldo Lenis, MD  metFORMIN (GLUCOPHAGE) 500 MG tablet Take 1 tablet (500 mg total) by mouth 2 (two) times daily with a meal. 05/03/15   Arnoldo Lenis, MD  metoprolol succinate (TOPROL-XL) 100 MG 24 hr tablet Take 1 tablet (100 mg total) by mouth daily. Take with or immediately following a meal. 05/03/15   Arnoldo Lenis, MD  metoprolol succinate (TOPROL-XL) 50 MG 24 hr tablet Take 1 tablet (50 mg total) by mouth daily. Take with or immediately following a meal. 05/03/15   Arnoldo Lenis, MD  potassium chloride (KLOR-CON) 20 MEQ packet Take 20 mEq by mouth daily. 12/02/15   Isaiah Serge, NP  rivaroxaban (XARELTO) 20 MG TABS tablet Take 1 tablet (20 mg total) by mouth daily with supper. 12/02/15   Arnoldo Lenis, MD    Family History Family History  Problem Relation Age of Onset  . Heart attack Mother   . Hypertension Brother   . Diabetes Brother   . Colon cancer Neg Hx     Social History Social History  Substance Use Topics  . Smoking status: Never Smoker  . Smokeless tobacco: Never Used  . Alcohol use 1.8 oz/week    3 Cans of beer per week     Comment: once a week  on disability Drinks on weekends   Allergies   Review of patient's allergies indicates no known allergies.   Review of Systems Review of Systems  All other systems reviewed and are negative.    Physical Exam Updated Vital Signs BP 138/100 (BP Location: Left Arm)   Temp 97.8 F (36.6 C) (Oral)   Resp 22   Ht 5'  11.5" (1.816 m)   Wt 280 lb (127 kg)   SpO2 (!) 89%   BMI 38.51 kg/m   Vital signs normal except for hypoxia   Physical Exam  Constitutional: He is oriented to person, place, and time. He appears well-developed and well-nourished.  Non-toxic appearance. He does not appear ill. No distress.  obese  HENT:  Head: Normocephalic and atraumatic.  Right Ear: External ear normal.  Left Ear: External ear normal.  Nose: Nose normal. No mucosal edema or rhinorrhea.  Mouth/Throat: Oropharynx is clear and moist and mucous membranes are normal. No dental abscesses or uvula swelling.  Eyes: Conjunctivae and EOM are normal. Pupils are equal, round, and reactive to light.  Neck: Normal range of motion and full passive range of motion without pain. Neck supple.  Cardiovascular: Normal rate and normal heart sounds.  An irregularly irregular rhythm present. Exam reveals no gallop and no friction rub.   No murmur heard. Pulmonary/Chest: Effort normal. No respiratory distress. He has decreased breath sounds. He has no wheezes. He has no rhonchi. He has no rales. He exhibits no tenderness and no crepitus.  During his pulse ox was 86% on room air, he was placed on nasal cannula oxygen.  Abdominal: Soft. Normal appearance and bowel sounds are normal. He exhibits distension. There is no tenderness. There is no rebound and no guarding.  Musculoskeletal: Normal range of motion. He exhibits edema. He exhibits no tenderness.  Moves all extremities well. Patient has 1+ to trace edema up to his knees bilaterally.   Neurological: He is alert and oriented to person, place, and time. He has normal strength. No cranial nerve deficit.  Skin: Skin is warm, dry and intact. No rash noted. No erythema. No pallor.  Psychiatric: He has a normal mood and affect. His speech is normal and behavior is normal. His mood appears not anxious.  Nursing note and vitals reviewed.    ED Treatments / Results  Labs (all labs ordered  are listed, but only abnormal results are displayed) Results for orders placed or performed during the hospital encounter of 12/06/15  Comprehensive metabolic panel  Result Value Ref Range   Sodium 143 135 - 145 mmol/L   Potassium 3.9 3.5 - 5.1 mmol/L   Chloride 102 101 - 111 mmol/L   CO2 34 (H) 22 - 32 mmol/L   Glucose, Bld 126 (H) 65 - 99 mg/dL   BUN 22 (H) 6 - 20 mg/dL   Creatinine, Ser 1.12 0.61 - 1.24 mg/dL   Calcium 8.9 8.9 - 10.3 mg/dL   Total Protein 6.3 (L) 6.5 - 8.1 g/dL   Albumin 3.4 (L) 3.5 - 5.0 g/dL   AST  20 15 - 41 U/L   ALT 39 17 - 63 U/L   Alkaline Phosphatase 47 38 - 126 U/L   Total Bilirubin 0.5 0.3 - 1.2 mg/dL   GFR calc non Af Amer >60 >60 mL/min   GFR calc Af Amer >60 >60 mL/min   Anion gap 7 5 - 15  Brain natriuretic peptide  Result Value Ref Range   B Natriuretic Peptide 174.0 (H) 0.0 - 100.0 pg/mL  CBC with Differential  Result Value Ref Range   WBC 8.9 4.0 - 10.5 K/uL   RBC 5.06 4.22 - 5.81 MIL/uL   Hemoglobin 15.2 13.0 - 17.0 g/dL   HCT 48.7 39.0 - 52.0 %   MCV 96.2 78.0 - 100.0 fL   MCH 30.0 26.0 - 34.0 pg   MCHC 31.2 30.0 - 36.0 g/dL   RDW 15.3 11.5 - 15.5 %   Platelets 145 (L) 150 - 400 K/uL   Neutrophils Relative % 67 %   Neutro Abs 5.9 1.7 - 7.7 K/uL   Lymphocytes Relative 24 %   Lymphs Abs 2.2 0.7 - 4.0 K/uL   Monocytes Relative 8 %   Monocytes Absolute 0.7 0.1 - 1.0 K/uL   Eosinophils Relative 1 %   Eosinophils Absolute 0.1 0.0 - 0.7 K/uL   Basophils Relative 0 %   Basophils Absolute 0.0 0.0 - 0.1 K/uL  Troponin I  Result Value Ref Range   Troponin I <0.03 <0.03 ng/mL   Laboratory interpretation all normal except metabolic alkalosis    EKG  EKG Interpretation  Date/Time:  Friday December 06 2015 02:18:24 EDT Ventricular Rate:  109 PR Interval:    QRS Duration: 126 QT Interval:  439 QTC Calculation: 510 R Axis:   -75 Text Interpretation:  Atrial flutter with variable A-V block Ventricular premature complex RBBB and LAFB  Nonspecific T abnormalities, lateral leads Baseline wander No significant change since last tracing 23 Aug 2009 Confirmed by [place]  MD-I, Dameer Speiser ([id number]) on 12/06/2015 2:21:49 AM       Radiology Dg Chest Port 1 View  Result Date: 12/06/2015 CLINICAL DATA:  Bilateral lower extremity swelling. Unable to take diuretics for the past 4 days. EXAM: PORTABLE CHEST 1 VIEW COMPARISON:  04/02/2015 FINDINGS: Stable cardiomegaly. Stable appearance of the mediastinum. Mild vascular congestion. Colonic interposition of bowel over the liver shadow again noted. No pneumonic consolidation, effusion or pneumothorax. No acute osseous abnormality. IMPRESSION: Cardiomegaly with mild vascular congestion. Electronically Signed   By: Ashley Royalty M.D.   On: 12/06/2015 02:59    Procedures Procedures (including critical care time)  Medications Ordered in ED Medications  furosemide (LASIX) injection 80 mg (80 mg Intravenous Given 12/06/15 0229)  nitroGLYCERIN (NITROGLYN) 2 % ointment 1 inch (1 inch Topical Given 12/06/15 0213)     Initial Impression / Assessment and Plan / ED Course  I have reviewed the triage vital signs and the nursing notes.  Pertinent labs & imaging results that were available during my care of the patient were reviewed by me and considered in my medical decision making (see chart for details).  Clinical Course   Patient was scanned in Lasix 80 mg IV. He had nitroglycerin paste placed on his chest. Patient's pulse ox improved with the nasal cannula oxygen.  We'll check at 3 AM patient is having good output. He states he has felt the urine about half full twice which would be around 1000 mL.  3 AM we discussed his test results. His pulse ox is  now 96% on room air. I will have nursing staff ambulate him and see how his oxygen does with exertion. Patient states he's feeling less tightness in his abdomen and his legs feel less tight although he still has edema present. He states he ran out of his  Lasix for 4 days and despite starting them back to still had the swelling. We discussed increasing his Lasix for the next couple of days until the swelling is gone.  Pt ambulated and pulse ox dropped to 74% on RA and he felt SOB, his pulse ox improved to 92 % with [place] oxygen.   I talked to the patient about his hypoxia and need for admission for further diuresis and he is agreeable.   04:15 AM Dr Darrick Meigs, admit to obs, tele  Review Dr. Judee Clara from several months ago he documents the patient has not had any lower extremity swelling.   Final Clinical Impressions(s) / ED Diagnoses   Final diagnoses:  Acute on chronic congestive heart failure, unspecified congestive heart failure type ([place])  Hypoxia  Atrial flutter, unspecified type [facility])    Plan admission  Rolland Porter, MD, Barbette Or, MD 12/06/15 [phone]

## 2015-12-06 NOTE — ED Notes (Signed)
Pt assisted around nurses' station, pt c/o sob and when assisted back to bed, pt's O2 sats in the lower 70's, O2 @ 2lpm administered and pt sats in the low to mid 90's; Dr. Tomi Bamberger notified of pt condition

## 2015-12-06 NOTE — Progress Notes (Signed)
Subjective: Patient was admitted yesterday due to exacerbation of diastolic CHF. He is started on IV diuretics, He diuresing well and feels better toady. No chest pain. Objective: Vital signs in last 24 hours: Temp:  [97.8 F (36.6 C)-98.4 F (36.9 C)] 98.4 F (36.9 C) (10/20 0500) Pulse Rate:  [71-83] 71 (10/20 0500) Resp:  [20-34] 20 (10/20 0500) BP: (111-138)/(73-104) 135/73 (10/20 0500) SpO2:  [89 %-96 %] 94 % (10/20 0500) Weight:  [127 kg (280 lb)-131.2 kg (289 lb 3.2 oz)] 131.2 kg (289 lb 3.2 oz) (10/20 0500) Weight change:  Last BM Date: 12/05/15  Intake/Output from previous day: 10/19 0701 - 10/20 0700 In: -  Out: 900 [Urine:900]  PHYSICAL EXAM General appearance: alert and no distress Resp: diminished breath sounds bilaterally and rhonchi bilaterally Cardio: S1, S2 normal GI: soft, non-tender; bowel sounds normal; no masses,  no organomegaly Extremities: 2++ leg edema  Lab Results:  Results for orders placed or performed during the hospital encounter of 12/06/15 (from the past 48 hour(s))  Comprehensive metabolic panel     Status: Abnormal   Collection Time: 12/06/15  2:26 AM  Result Value Ref Range   Sodium 143 135 - 145 mmol/L   Potassium 3.9 3.5 - 5.1 mmol/L   Chloride 102 101 - 111 mmol/L   CO2 34 (H) 22 - 32 mmol/L   Glucose, Bld 126 (H) 65 - 99 mg/dL   BUN 22 (H) 6 - 20 mg/dL   Creatinine, Ser 1.12 0.61 - 1.24 mg/dL   Calcium 8.9 8.9 - 10.3 mg/dL   Total Protein 6.3 (L) 6.5 - 8.1 g/dL   Albumin 3.4 (L) 3.5 - 5.0 g/dL   AST 20 15 - 41 U/L   ALT 39 17 - 63 U/L   Alkaline Phosphatase 47 38 - 126 U/L   Total Bilirubin 0.5 0.3 - 1.2 mg/dL   GFR calc non Af Amer >60 >60 mL/min   GFR calc Af Amer >60 >60 mL/min    Comment: (NOTE) The eGFR has been calculated using the CKD EPI equation. This calculation has not been validated in all clinical situations. eGFR's persistently <60 mL/min signify possible Chronic Kidney Disease.    Anion gap 7 5 - 15  Brain  natriuretic peptide     Status: Abnormal   Collection Time: 12/06/15  2:26 AM  Result Value Ref Range   B Natriuretic Peptide 174.0 (H) 0.0 - 100.0 pg/mL  CBC with Differential     Status: Abnormal   Collection Time: 12/06/15  2:26 AM  Result Value Ref Range   WBC 8.9 4.0 - 10.5 K/uL   RBC 5.06 4.22 - 5.81 MIL/uL   Hemoglobin 15.2 13.0 - 17.0 g/dL   HCT 48.7 39.0 - 52.0 %   MCV 96.2 78.0 - 100.0 fL   MCH 30.0 26.0 - 34.0 pg   MCHC 31.2 30.0 - 36.0 g/dL   RDW 15.3 11.5 - 15.5 %   Platelets 145 (L) 150 - 400 K/uL   Neutrophils Relative % 67 %   Neutro Abs 5.9 1.7 - 7.7 K/uL   Lymphocytes Relative 24 %   Lymphs Abs 2.2 0.7 - 4.0 K/uL   Monocytes Relative 8 %   Monocytes Absolute 0.7 0.1 - 1.0 K/uL   Eosinophils Relative 1 %   Eosinophils Absolute 0.1 0.0 - 0.7 K/uL   Basophils Relative 0 %   Basophils Absolute 0.0 0.0 - 0.1 K/uL  Troponin I     Status: None  Collection Time: 12/06/15  2:26 AM  Result Value Ref Range   Troponin I <0.03 <0.03 ng/mL  Glucose, capillary     Status: Abnormal   Collection Time: 12/06/15  7:42 AM  Result Value Ref Range   Glucose-Capillary 106 (H) 65 - 99 mg/dL   Comment 1 Notify RN     ABGS No results for input(s): PHART, PO2ART, TCO2, HCO3 in the last 72 hours.  Invalid input(s): PCO2 CULTURES No results found for this or any previous visit (from the past 240 hour(s)). Studies/Results: Dg Chest Port 1 View  Result Date: 12/06/2015 CLINICAL DATA:  Bilateral lower extremity swelling. Unable to take diuretics for the past 4 days. EXAM: PORTABLE CHEST 1 VIEW COMPARISON:  04/02/2015 FINDINGS: Stable cardiomegaly. Stable appearance of the mediastinum. Mild vascular congestion. Colonic interposition of bowel over the liver shadow again noted. No pneumonic consolidation, effusion or pneumothorax. No acute osseous abnormality. IMPRESSION: Cardiomegaly with mild vascular congestion. Electronically Signed   By: Ashley Royalty M.D.   On: 12/06/2015 02:59     Medications: I have reviewed the patient's current medications.  Assesment:  Active Problems:   Acute exacerbation of CHF (congestive heart failure) ([place])    Plan: Medications reviewed Continue IV diuretics Continue telemetry Will monitor CBC/BMP    LOS: 0 days   Dennis Swanson 12/06/2015, 8:17 AM

## 2015-12-06 NOTE — H&P (Signed)
[facility] H&P    Patient Demographics:    Dennis Swanson, is a 60 y.o. male  MRN: [id number]  DOB - 05/09/1955  Admit Date - 12/06/2015  Referring MD/NP/PA: Dr. Tomi Bamberger  Outpatient Primary MD for the patient is Rosita Fire, MD  Patient coming from: Home  Chief Complaint  Patient presents with  . Foot Swelling      HPI:    Dennis Swanson  is a 60 y.o. male, With a history of diabetes mellitus, hypertension, diastolic heart failure came with worsening shortness of breath with leg swelling for past 4 days. Patient takes Lasix at home and recently ran out of Lasix. When he got Lasix refilled  from [facility], it did not help him with leg swelling or shortness of breath. He denies chest pain, no nausea vomiting or diarrhea. No fever or dysuria.  In the ED patient was given IV Lasix and improved. O2 sats dropped to 70s on ambulation, patient will be admitted for acute CHF exacerbation      Review of systems:    In addition to the HPI above,  No Fever-chills, No Headache, No changes with Vision or hearing, No problems swallowing food or Liquids, No Abdominal pain, No Nausea or Vomiting, bowel movements are regular, No Blood in stool or Urine, No dysuria, No new skin rashes or bruises, No new joints pains-aches,  No new weakness, tingling, numbness in any extremity, No recent weight gain or loss, No polyuria, polydypsia or polyphagia, No significant Mental Stressors.  A full 10 point Review of Systems was done, except as stated above, all other Review of Systems were negative.   With Past History of the following :    Past Medical History:  Diagnosis Date  . Acute pulmonary embolism ([place]) 2011  . Alcohol abuse   . Atrial flutter ([place])   . CHF (congestive heart failure) ([place])   . Hypertension   . Lower extremity cellulitis    bilateral  . Systolic dysfunction       Past Surgical History:    Procedure Laterality Date  . COLONOSCOPY N/A 05/17/2012   Procedure: COLONOSCOPY;  Surgeon: Danie Binder, MD;  Location[facility];  Service: Endoscopy;  Laterality: N/A;  10:30  . None        Social History:      Social History  Substance Use Topics  . Smoking status: Never Smoker  . Smokeless tobacco: Never Used  . Alcohol use 1.8 oz/week    3 Cans of beer per week     Comment: once a week       Family History :     Family History  Problem Relation Age of Onset  . Heart attack Mother   . Hypertension Brother   . Diabetes Brother   . Colon cancer Neg Hx       Home Medications:   Prior to Admission medications   Medication Sig Start Date End Date Taking? Authorizing Provider  albuterol (PROVENTIL HFA;VENTOLIN HFA) 108 (90 Base) MCG/ACT inhaler Inhale 1-2 puffs into the lungs  every 6 (six) hours as needed for wheezing or shortness of breath. 04/02/15   Tammy Triplett, PA-C  atorvastatin (LIPITOR) 40 MG tablet Take 1 tablet (40 mg total) by mouth daily at 6 PM. 12/02/15   Arnoldo Lenis, MD  diltiazem (CARDIZEM CD) 240 MG 24 hr capsule Take 1 capsule (240 mg total) by mouth daily. 12/02/15   Arnoldo Lenis, MD  furosemide (LASIX) 40 MG tablet Take 1 tablet (40 mg total) by mouth daily. 11/27/15   Arnoldo Lenis, MD  lisinopril (PRINIVIL,ZESTRIL) 40 MG tablet Take 1 tablet (40 mg total) by mouth daily. 12/02/15   Arnoldo Lenis, MD  metFORMIN (GLUCOPHAGE) 500 MG tablet Take 1 tablet (500 mg total) by mouth 2 (two) times daily with a meal. 05/03/15   Arnoldo Lenis, MD  metoprolol succinate (TOPROL-XL) 100 MG 24 hr tablet Take 1 tablet (100 mg total) by mouth daily. Take with or immediately following a meal. 05/03/15   Arnoldo Lenis, MD  metoprolol succinate (TOPROL-XL) 50 MG 24 hr tablet Take 1 tablet (50 mg total) by mouth daily. Take with or immediately following a meal. 05/03/15   Arnoldo Lenis, MD  potassium chloride (KLOR-CON) 20 MEQ packet Take  20 mEq by mouth daily. 12/02/15   Isaiah Serge, NP  rivaroxaban (XARELTO) 20 MG TABS tablet Take 1 tablet (20 mg total) by mouth daily with supper. 12/02/15   Arnoldo Lenis, MD     Allergies:    No Known Allergies   Physical Exam:   Vitals  Blood pressure 111/75, pulse 72, temperature 97.8 F (36.6 C), temperature source Oral, resp. rate (!) 30, height 5' 11.5" (1.816 m), weight 127 kg (280 lb), SpO2 95 %.  1.  General: Appears in no acute distress   2. Psychiatric:  Intact judgement and  insight, awake alert, oriented x 3.  3. Neurologic: No focal neurological deficits, all cranial nerves intact.Strength 5/5 all 4 extremities, sensation intact all 4 extremities, plantars down going.  4. Eyes :  anicteric sclerae, moist conjunctivae with no lid lag. PERRLA.  5. ENMT:  Oropharynx clear with moist mucous membranes and good dentition  6. Neck:  supple, no cervical lymphadenopathy appriciated, No thyromegaly  7. Respiratory : Normal respiratory effort, faint bibasilar wheezing   8. Cardiovascular : RRR, no gallops, rubs or murmurs, bilateral 1+ pitting edema of the lower extremities   9. Gastrointestinal:  Positive bowel sounds, abdomen soft, non-tender to palpation,no hepatosplenomegaly, no rigidity or guarding       10. Skin:  No cyanosis, normal texture and turgor, no rash, lesions or ulcers  11.Musculoskeletal:  Good muscle tone,  joints appear normal , no effusions,  normal range of motion    Data Review:    CBC  Recent Labs Lab 12/06/15 0226  WBC 8.9  HGB 15.2  HCT 48.7  PLT 145*  MCV 96.2  MCH 30.0  MCHC 31.2  RDW 15.3  LYMPHSABS 2.2  MONOABS 0.7  EOSABS 0.1  BASOSABS 0.0   ------------------------------------------------------------------------------------------------------------------  Chemistries   Recent Labs Lab 12/06/15 0226  NA 143  K 3.9  CL 102  CO2 34*  GLUCOSE 126*  BUN 22*  CREATININE 1.12  CALCIUM 8.9  AST 20    ALT 39  ALKPHOS 47  BILITOT 0.5   ------------------------------------------------------------------------------------------------------------------  ------------------------------------------------------------------------------------------------------------------ GFR: Estimated Creatinine Clearance: 94.7 mL/min (by C-G formula based on SCr of 1.12 mg/dL). Liver Function Tests:  Recent Labs Lab 12/06/15 0226  AST 20  ALT 39  ALKPHOS 47  BILITOT 0.5  PROT 6.3*  ALBUMIN 3.4*   Cardiac Enzymes:  Recent Labs Lab 12/06/15 0226  TROPONINI <0.03    --------------------------------------------------------------------------------------------------------------- Urine analysis:     Imaging Results:    Dg Chest Port 1 View  Result Date: 12/06/2015 CLINICAL DATA:  Bilateral lower extremity swelling. Unable to take diuretics for the past 4 days. EXAM: PORTABLE CHEST 1 VIEW COMPARISON:  04/02/2015 FINDINGS: Stable cardiomegaly. Stable appearance of the mediastinum. Mild vascular congestion. Colonic interposition of bowel over the liver shadow again noted. No pneumonic consolidation, effusion or pneumothorax. No acute osseous abnormality. IMPRESSION: Cardiomegaly with mild vascular congestion. Electronically Signed   By: Ashley Royalty M.D.   On: 12/06/2015 02:59    My personal review of EKG: Rhythm Atrial flutter   Assessment & Plan:    Active Problems:   Acute exacerbation of CHF (congestive heart failure) ([place])   1. Acute on chronic diastolic CHF- place under observation, Lasix 20 mg IV every 12 hours, intake and output. Daily BMP 2. Atrial flutter- rate controlled, continue anticoagulation with Xarelto, continue Cardizem, metoprolol 3. Diabetes mellitus - hold metformin, start sliding scale insulin with NovoLog.  4. Hypertension- continue metoprolol, Cardizem, lisinopril. 5. Hyperlipidemia-continue Lipitor    DVT Prophylaxis-   Lovenox   AM Labs Ordered, also please  review Full Orders  Family Communication: Admission, patients condition and plan of care including tests being ordered have been discussed with the patient and  who indicate understanding and agree with the plan and Code Status.  Code Status:  Full code  Admission status: Observation    Time spent in minutes : 60 minutes   Cumi Sanagustin S M.D on 12/06/2015 at 4:41 AM  Between 7am to 7pm - Pager - [phone]. After 7pm go to [email or web address] - password [facility]  [facility] - Office  [phone]

## 2015-12-06 NOTE — Care Management Note (Signed)
Case Management Note  Patient Details  Name: Dennis Swanson MRN: [id number] Date of Birth: 25-Jun-1955  Subjective/Objective:   Patient adm with acute CHF exacerbation. He has not had his meds in a few days.  He lives with a friend and is ind with ADL's. He has a PCP, transportation, and reports no issues affording medications.                  Action/Plan: Anticipate DC home with self care. Will follow for any oxygen needs.    Expected Discharge Date:  12/07/15               Expected Discharge Plan:  Home/Self Care  In-House Referral:     Discharge planning Services  CM Consult  Post Acute Care Choice:  NA Choice offered to:  NA  DME Arranged:    DME Agency:     HH Arranged:    HH Agency:     Status of Service:  In process, will continue to follow  If discussed at Long Length of Stay Meetings, dates discussed:    Additional Comments:  Dalyah Pla, Chauncey Reading, RN 12/06/2015, 11:50 AM

## 2015-12-06 NOTE — ED Notes (Signed)
Report given to [facility] on 300

## 2015-12-06 NOTE — ED Triage Notes (Signed)
Pt states he was out of his fluid meds for about 4 days, has started back on it, but his feet have swollen and not gone done.

## 2015-12-07 LAB — GLUCOSE, CAPILLARY
GLUCOSE-CAPILLARY: 139 mg/dL — AB (ref 65–99)
GLUCOSE-CAPILLARY: 134 mg/dL — AB (ref 65–99)
GLUCOSE-CAPILLARY: 109 mg/dL — AB (ref 65–99)
Glucose-Capillary: 134 mg/dL — ABNORMAL HIGH (ref 65–99)

## 2015-12-07 LAB — BASIC METABOLIC PANEL
Anion gap: 7 (ref 5–15)
BUN: 16 mg/dL (ref 6–20)
CHLORIDE: 92 mmol/L — AB (ref 101–111)
CO2: 43 mmol/L — ABNORMAL HIGH (ref 22–32)
CREATININE: 1.28 mg/dL — AB (ref 0.61–1.24)
Calcium: 9 mg/dL (ref 8.9–10.3)
GFR, EST NON AFRICAN AMERICAN: 59 mL/min — AB (ref >60)
Glucose, Bld: 116 mg/dL — ABNORMAL HIGH (ref 65–99)
POTASSIUM: 5 mmol/L (ref 3.5–5.1)
SODIUM: 142 mmol/L (ref 135–145)

## 2015-12-07 LAB — HEMOGLOBIN A1C
Hgb A1c MFr Bld: 6.3 % — ABNORMAL HIGH (ref 4.8–5.6)
MEAN PLASMA GLUCOSE: 134 mg/dL

## 2015-12-07 MED ORDER — FUROSEMIDE 10 MG/ML IJ SOLN
40.0000 mg | Freq: Two times a day (BID) | INTRAMUSCULAR | Status: DC
  Administered 2015-12-07 – 2015-12-08 (×2): 40 mg via INTRAVENOUS
  Filled 2015-12-07 (×2): qty 4

## 2015-12-07 MED ORDER — POTASSIUM CHLORIDE 20 MEQ PO PACK: 40.0000 meq | PACK | Freq: Two times a day (BID) | ORAL | Status: DC

## 2015-12-07 NOTE — Progress Notes (Signed)
Subjective: Patient feels better. His breathing is improving. He is diuresing well. His leg edema is less Objective: Vital signs in last 24 hours: Temp:  [97.4 F (36.3 C)-99.2 F (37.3 C)] 99.2 F (37.3 C) (10/21 0553) Pulse Rate:  [69-99] 99 (10/21 0553) Resp:  [19-20] 19 (10/21 0553) BP: (113-136)/(73-106) 132/106 (10/21 0553) SpO2:  [94 %-96 %] 96 % (10/21 0553) Weight:  [128.8 kg (284 lb)] 128.8 kg (284 lb) (10/21 0553) Weight change: 1.814 kg (4 lb) Last BM Date: 12/05/15  Intake/Output from previous day: 10/20 0701 - 10/21 0700 In: -  Out: 1600 [Urine:1600]  PHYSICAL EXAM General appearance: alert and no distress Resp: diminished breath sounds bilaterally and rhonchi bilaterally Cardio: S1, S2 normal GI: soft, non-tender; bowel sounds normal; no masses,  no organomegaly Extremities: 2++ leg edema  Lab Results:  Results for orders placed or performed during the hospital encounter of 12/06/15 (from the past 48 hour(s))  Comprehensive metabolic panel     Status: Abnormal   Collection Time: 12/06/15  2:26 AM  Result Value Ref Range   Sodium 143 135 - 145 mmol/L   Potassium 3.9 3.5 - 5.1 mmol/L   Chloride 102 101 - 111 mmol/L   CO2 34 (H) 22 - 32 mmol/L   Glucose, Bld 126 (H) 65 - 99 mg/dL   BUN 22 (H) 6 - 20 mg/dL   Creatinine, Ser 1.12 0.61 - 1.24 mg/dL   Calcium 8.9 8.9 - 10.3 mg/dL   Total Protein 6.3 (L) 6.5 - 8.1 g/dL   Albumin 3.4 (L) 3.5 - 5.0 g/dL   AST 20 15 - 41 U/L   ALT 39 17 - 63 U/L   Alkaline Phosphatase 47 38 - 126 U/L   Total Bilirubin 0.5 0.3 - 1.2 mg/dL   GFR calc non Af Amer >60 >60 mL/min   GFR calc Af Amer >60 >60 mL/min    Comment: (NOTE) The eGFR has been calculated using the CKD EPI equation. This calculation has not been validated in all clinical situations. eGFR's persistently <60 mL/min signify possible Chronic Kidney Disease.    Anion gap 7 5 - 15  Brain natriuretic peptide     Status: Abnormal   Collection Time: 12/06/15  2:26  AM  Result Value Ref Range   B Natriuretic Peptide 174.0 (H) 0.0 - 100.0 pg/mL  CBC with Differential     Status: Abnormal   Collection Time: 12/06/15  2:26 AM  Result Value Ref Range   WBC 8.9 4.0 - 10.5 K/uL   RBC 5.06 4.22 - 5.81 MIL/uL   Hemoglobin 15.2 13.0 - 17.0 g/dL   HCT 48.7 39.0 - 52.0 %   MCV 96.2 78.0 - 100.0 fL   MCH 30.0 26.0 - 34.0 pg   MCHC 31.2 30.0 - 36.0 g/dL   RDW 15.3 11.5 - 15.5 %   Platelets 145 (L) 150 - 400 K/uL   Neutrophils Relative % 67 %   Neutro Abs 5.9 1.7 - 7.7 K/uL   Lymphocytes Relative 24 %   Lymphs Abs 2.2 0.7 - 4.0 K/uL   Monocytes Relative 8 %   Monocytes Absolute 0.7 0.1 - 1.0 K/uL   Eosinophils Relative 1 %   Eosinophils Absolute 0.1 0.0 - 0.7 K/uL   Basophils Relative 0 %   Basophils Absolute 0.0 0.0 - 0.1 K/uL  Troponin I     Status: None   Collection Time: 12/06/15  2:26 AM  Result Value Ref Range   Troponin  I <0.03 <0.03 ng/mL  Hemoglobin A1c     Status: Abnormal   Collection Time: 12/06/15  5:52 AM  Result Value Ref Range   Hgb A1c MFr Bld 6.3 (H) 4.8 - 5.6 %    Comment: (NOTE)         Pre-diabetes: 5.7 - 6.4         Diabetes: >6.4         Glycemic control for adults with diabetes: <7.0    Mean Plasma Glucose 134 mg/dL    Comment: (NOTE) Performed At: [facility] [place], [place] [id number] Lindon Romp MD [id number]   Glucose, capillary     Status: Abnormal   Collection Time: 12/06/15  7:42 AM  Result Value Ref Range   Glucose-Capillary 106 (H) 65 - 99 mg/dL   Comment 1 Notify RN   Glucose, capillary     Status: Abnormal   Collection Time: 12/06/15 11:41 AM  Result Value Ref Range   Glucose-Capillary 164 (H) 65 - 99 mg/dL   Comment 1 Notify RN   Glucose, capillary     Status: None   Collection Time: 12/06/15  4:37 PM  Result Value Ref Range   Glucose-Capillary 81 65 - 99 mg/dL   Comment 1 Notify RN   Glucose, capillary     Status: None   Collection Time: 12/06/15  9:05 PM  Result  Value Ref Range   Glucose-Capillary 95 65 - 99 mg/dL   Comment 1 Notify RN   Basic metabolic panel     Status: Abnormal   Collection Time: 12/07/15  5:54 AM  Result Value Ref Range   Sodium 142 135 - 145 mmol/L   Potassium 5.0 3.5 - 5.1 mmol/L    Comment: DELTA CHECK NOTED NO VISIBLE HEMOLYSIS    Chloride 92 (L) 101 - 111 mmol/L   CO2 43 (H) 22 - 32 mmol/L   Glucose, Bld 116 (H) 65 - 99 mg/dL   BUN 16 6 - 20 mg/dL   Creatinine, Ser 1.28 (H) 0.61 - 1.24 mg/dL   Calcium 9.0 8.9 - 10.3 mg/dL   GFR calc non Af Amer 59 (L) >60 mL/min   GFR calc Af Amer >60 >60 mL/min    Comment: (NOTE) The eGFR has been calculated using the CKD EPI equation. This calculation has not been validated in all clinical situations. eGFR's persistently <60 mL/min signify possible Chronic Kidney Disease.    Anion gap 7 5 - 15  Glucose, capillary     Status: Abnormal   Collection Time: 12/07/15  7:46 AM  Result Value Ref Range   Glucose-Capillary 139 (H) 65 - 99 mg/dL   Comment 1 Notify RN    Comment 2 Document in Chart     ABGS No results for input(s): PHART, PO2ART, TCO2, HCO3 in the last 72 hours.  Invalid input(s): PCO2 CULTURES No results found for this or any previous visit (from the past 240 hour(s)). Studies/Results: Dg Chest Port 1 View  Result Date: 12/06/2015 CLINICAL DATA:  Bilateral lower extremity swelling. Unable to take diuretics for the past 4 days. EXAM: PORTABLE CHEST 1 VIEW COMPARISON:  04/02/2015 FINDINGS: Stable cardiomegaly. Stable appearance of the mediastinum. Mild vascular congestion. Colonic interposition of bowel over the liver shadow again noted. No pneumonic consolidation, effusion or pneumothorax. No acute osseous abnormality. IMPRESSION: Cardiomegaly with mild vascular congestion. Electronically Signed   By: Ashley Royalty M.D.   On: 12/06/2015 02:59    Medications: I have  reviewed the patient's current medications.  Assesment:  Active Problems:   Acute exacerbation of  CHF (congestive heart failure) (HCC)    Plan: Medications reviewed  will increase lasix to 40 mg IV BID Continue telemetry Will monitor CBC/BMP    LOS: 1 day   Devinn Hurwitz 12/07/2015, 10:07 AM

## 2015-12-08 LAB — BASIC METABOLIC PANEL
ANION GAP: 8 (ref 5–15)
BUN: 17 mg/dL (ref 6–20)
CALCIUM: 8.9 mg/dL (ref 8.9–10.3)
CO2: 40 mmol/L — AB (ref 22–32)
CREATININE: 1.28 mg/dL — AB (ref 0.61–1.24)
Chloride: 92 mmol/L — ABNORMAL LOW (ref 101–111)
GFR calc Af Amer: >60 mL/min (ref >60)
GFR, EST NON AFRICAN AMERICAN: 59 mL/min — AB (ref >60)
GLUCOSE: 130 mg/dL — AB (ref 65–99)
Potassium: 4.2 mmol/L (ref 3.5–5.1)
Sodium: 140 mmol/L (ref 135–145)

## 2015-12-08 LAB — GLUCOSE, CAPILLARY
Glucose-Capillary: 151 mg/dL — ABNORMAL HIGH (ref 65–99)
Glucose-Capillary: 110 mg/dL — ABNORMAL HIGH (ref 65–99)

## 2015-12-08 MED ORDER — ONDANSETRON HCL 4 MG/2ML IJ SOLN: 4.0000 mg | Freq: Four times a day (QID) | INTRAMUSCULAR | 0 refills | Status: DC | PRN

## 2015-12-08 NOTE — Discharge Summary (Addendum)
Physician Discharge Summary  Patient ID: Dennis Swanson MRN: [id number] DOB/AGE: 1955-11-22 60 y.o. Primary Care Physician:Domanick Cuccia, MD Admit date: 12/06/2015 Discharge date: 12/08/2015    Discharge Diagnoses:   Active Problems:   Acute exacerbation of CHF (congestive heart failure) (HCC) hypertension Atrial fibrillation DM type 2    Medication List    TAKE these medications   albuterol 108 (90 Base) MCG/ACT inhaler Commonly known as:  PROVENTIL HFA;VENTOLIN HFA Inhale 1-2 puffs into the lungs every 6 (six) hours as needed for wheezing or shortness of breath.   atorvastatin 40 MG tablet Commonly known as:  LIPITOR Take 1 tablet (40 mg total) by mouth daily at 6 PM.   diltiazem 240 MG 24 hr capsule Commonly known as:  CARDIZEM CD Take 1 capsule (240 mg total) by mouth daily.   furosemide 40 MG tablet Commonly known as:  LASIX Take 1 tablet (40 mg total) by mouth daily.   lisinopril 40 MG tablet Commonly known as:  PRINIVIL,ZESTRIL Take 1 tablet (40 mg total) by mouth daily.   metFORMIN 500 MG tablet Commonly known as:  GLUCOPHAGE Take 1 tablet (500 mg total) by mouth 2 (two) times daily with a meal.   metoprolol succinate 50 MG 24 hr tablet Commonly known as:  TOPROL-XL Take 1 tablet (50 mg total) by mouth daily. Take with or immediately following a meal.   metoprolol succinate 100 MG 24 hr tablet Commonly known as:  TOPROL-XL Take 1 tablet (100 mg total) by mouth daily. Take with or immediately following a meal.   potassium chloride 20 MEQ packet Commonly known as:  KLOR-CON Take 20 mEq by mouth daily.   rivaroxaban 20 MG Tabs tablet Commonly known as:  XARELTO Take 1 tablet (20 mg total) by mouth daily with supper.       Discharged Condition: improved    Consults: None  Significant Diagnostic Studies: Dg Chest Port 1 View  Result Date: 12/06/2015 CLINICAL DATA:  Bilateral lower extremity swelling. Unable to take diuretics for the  past 4 days. EXAM: PORTABLE CHEST 1 VIEW COMPARISON:  04/02/2015 FINDINGS: Stable cardiomegaly. Stable appearance of the mediastinum. Mild vascular congestion. Colonic interposition of bowel over the liver shadow again noted. No pneumonic consolidation, effusion or pneumothorax. No acute osseous abnormality. IMPRESSION: Cardiomegaly with mild vascular congestion. Electronically Signed   By: Ashley Royalty M.D.   On: 12/06/2015 02:59    Lab Results: Basic Metabolic Panel:  Recent Labs  12/07/15 0554 12/08/15 0519  NA 142 140  K 5.0 4.2  CL 92* 92*  CO2 43* 40*  GLUCOSE 116* 130*  BUN 16 17  CREATININE 1.28* 1.28*  CALCIUM 9.0 8.9   Liver Function Tests:  Recent Labs  12/06/15 0226  AST 20  ALT 39  ALKPHOS 47  BILITOT 0.5  PROT 6.3*  ALBUMIN 3.4*     CBC:  Recent Labs  12/06/15 0226  WBC 8.9  NEUTROABS 5.9  HGB 15.2  HCT 48.7  MCV 96.2  PLT 145*    No results found for this or any previous visit (from the past 240 hour(s)).   Hospital Course:  This is a 60 years old male with history of multiple medical illness was admitted due to decompensated CHF. Patient waas started on IV diuretics. He was monitored under telemetry. Patient improved. His leg edema subsided. Patient discharged in stable condition to continue his medications including diuretics at home. He will be followed in out patient.  Discharge Exam: Blood pressure 127/74,  pulse 93, temperature 99.7 F (37.6 C), temperature source Oral, resp. rate 18, height 5' 11.5" (1.816 m), weight 129.9 kg (286 lb 6.4 oz), SpO2 96 %.   Disposition:  Home.    Follow-up Information    Azar South, MD Follow up in 3 week(s).   Specialty:  Internal Medicine Contact information: [place]  [address] [phone]           Signed: Rosita Fire   12/08/2015, 12:29 PM

## 2015-12-08 NOTE — Progress Notes (Signed)
Iv Removed. D/C instructions given to patient. Verbalized understanding. Ride is here to take patient home.

## 2015-12-10 ENCOUNTER — Emergency Department (HOSPITAL_COMMUNITY): Payer: Medicaid Other

## 2015-12-10 ENCOUNTER — Emergency Department (HOSPITAL_COMMUNITY)
Admission: EM | Admit: 2015-12-10 | Discharge: 2015-12-10 | Disposition: A | Discharge status: Home / Self Care | Payer: Medicaid Other | Attending: Emergency Medicine | Admitting: Emergency Medicine

## 2015-12-10 ENCOUNTER — Encounter (HOSPITAL_COMMUNITY): Payer: Self-pay | Admitting: Emergency Medicine

## 2015-12-10 DIAGNOSIS — R609 Edema, unspecified: Secondary | ICD-10-CM | POA: Diagnosis not present

## 2015-12-10 DIAGNOSIS — R600 Localized edema: Secondary | ICD-10-CM

## 2015-12-10 DIAGNOSIS — I509 Heart failure, unspecified: Secondary | ICD-10-CM | POA: Insufficient documentation

## 2015-12-10 DIAGNOSIS — I110 Hypertensive heart disease with heart failure: Secondary | ICD-10-CM | POA: Diagnosis not present

## 2015-12-10 DIAGNOSIS — Z79899 Other long term (current) drug therapy: Secondary | ICD-10-CM | POA: Insufficient documentation

## 2015-12-10 DIAGNOSIS — M7989 Other specified soft tissue disorders: Secondary | ICD-10-CM | POA: Diagnosis present

## 2015-12-10 LAB — CBC
HCT: 48.4 % (ref 39.0–52.0)
Hemoglobin: 15.3 g/dL (ref 13.0–17.0)
MCH: 30.1 pg (ref 26.0–34.0)
MCHC: 31.6 g/dL (ref 30.0–36.0)
MCV: 95.1 fL (ref 78.0–100.0)
PLATELETS: 140 10*3/uL — AB (ref 150–400)
RBC: 5.09 MIL/uL (ref 4.22–5.81)
RDW: 14.6 % (ref 11.5–15.5)
WBC: 7.3 10*3/uL (ref 4.0–10.5)

## 2015-12-10 LAB — BASIC METABOLIC PANEL
Anion gap: 7 (ref 5–15)
BUN: 21 mg/dL — AB (ref 6–20)
CALCIUM: 9 mg/dL (ref 8.9–10.3)
CO2: 37 mmol/L — AB (ref 22–32)
CREATININE: 1.22 mg/dL (ref 0.61–1.24)
Chloride: 95 mmol/L — ABNORMAL LOW (ref 101–111)
GFR calc Af Amer: >60 mL/min (ref >60)
GLUCOSE: 94 mg/dL (ref 65–99)
Potassium: 4 mmol/L (ref 3.5–5.1)
Sodium: 139 mmol/L (ref 135–145)

## 2015-12-10 LAB — BRAIN NATRIURETIC PEPTIDE: B NATRIURETIC PEPTIDE 5: 178 pg/mL — AB (ref 0.0–100.0)

## 2015-12-10 NOTE — ED Provider Notes (Signed)
[facility] DEPT Provider Note   CSN: [id number] Arrival date & time: 12/10/15  1823     History   Chief Complaint Chief Complaint  Patient presents with  . Leg Swelling    HPI Dennis Swanson is a 60 y.o. male.  HPI Pt was seen at [facility]. Per pt, c/o gradual onset and persistence of constant "ankles swelling" for the past 2 days. Pt states he was discharged from the hospital on Sunday for "too much fluid." Pt states he was discharged with compression hose, but has not been wearing them. Endorses taking his meds as prescribed. Denies CP/SOB, no abd pain, no N/V/D, no focal motor weakness, no tingling/numbness in extremities, no calf/LE pain or unilateral swelling, no rash, no fevers.    Past Medical History:  Diagnosis Date  . Acute pulmonary embolism ([place]) 2011  . Alcohol abuse   . Atrial flutter ([place])   . CHF (congestive heart failure) ([place])   . Hypertension   . Lower extremity cellulitis    bilateral  . Systolic dysfunction     Patient Active Problem List   Diagnosis Date Noted  . Acute exacerbation of CHF (congestive heart failure) ([place]) 12/06/2015  . Encounter for therapeutic drug monitoring 04/10/2015  . Atrial fibrillation ([place]) 04/03/2015  . Nonischemic cardiomyopathy ([place]) 04/03/2015  . Current use of long term anticoagulation 04/03/2015  . Encounter for screening colonoscopy 04/27/2012  . Essential hypertension 04/27/2012  . CHF 12/17/2009  . ALCOHOL ABUSE 09/27/2009    Past Surgical History:  Procedure Laterality Date  . COLONOSCOPY N/A 05/17/2012   Procedure: COLONOSCOPY;  Surgeon: Danie Binder, MD;  Location: [facility] SUITE;  Service: Endoscopy;  Laterality: N/A;  10:30  . None         Home Medications    Prior to Admission medications   Medication Sig Start Date End Date Taking? Authorizing Provider  albuterol (PROVENTIL HFA;VENTOLIN HFA) 108 (90 Base) MCG/ACT inhaler Inhale 1-2 puffs into the lungs every 6 (six) hours as needed for  wheezing or shortness of breath. 04/02/15   Tammy Triplett, PA-C  atorvastatin (LIPITOR) 40 MG tablet Take 1 tablet (40 mg total) by mouth daily at 6 PM. 12/02/15   Arnoldo Lenis, MD  diltiazem (CARDIZEM CD) 240 MG 24 hr capsule Take 1 capsule (240 mg total) by mouth daily. 12/02/15   Arnoldo Lenis, MD  furosemide (LASIX) 40 MG tablet Take 1 tablet (40 mg total) by mouth daily. 11/27/15   Arnoldo Lenis, MD  lisinopril (PRINIVIL,ZESTRIL) 40 MG tablet Take 1 tablet (40 mg total) by mouth daily. 12/02/15   Arnoldo Lenis, MD  metFORMIN (GLUCOPHAGE) 500 MG tablet Take 1 tablet (500 mg total) by mouth 2 (two) times daily with a meal. 05/03/15   Arnoldo Lenis, MD  metoprolol succinate (TOPROL-XL) 100 MG 24 hr tablet Take 1 tablet (100 mg total) by mouth daily. Take with or immediately following a meal. 05/03/15   Arnoldo Lenis, MD  metoprolol succinate (TOPROL-XL) 50 MG 24 hr tablet Take 1 tablet (50 mg total) by mouth daily. Take with or immediately following a meal. 05/03/15   Arnoldo Lenis, MD  potassium chloride (KLOR-CON) 20 MEQ packet Take 20 mEq by mouth daily. 12/02/15   Isaiah Serge, NP  rivaroxaban (XARELTO) 20 MG TABS tablet Take 1 tablet (20 mg total) by mouth daily with supper. 12/02/15   Arnoldo Lenis, MD    Family History Family History  Problem Relation Age  of Onset  . Heart attack Mother   . Hypertension Brother   . Diabetes Brother   . Colon cancer Neg Hx     Social History Social History  Substance Use Topics  . Smoking status: Never Smoker  . Smokeless tobacco: Never Used  . Alcohol use 1.8 oz/week    3 Cans of beer per week     Comment: once a week     Allergies   Review of patient's allergies indicates no known allergies.   Review of Systems Review of Systems ROS: Statement: All systems negative except as marked or noted in the HPI; Constitutional: Negative for fever and chills. ; ; Eyes: Negative for eye pain, redness and discharge.  ; ; ENMT: Negative for ear pain, hoarseness, nasal congestion, sinus pressure and sore throat. ; ; Cardiovascular: Negative for chest pain, palpitations, diaphoresis, dyspnea and +peripheral edema. ; ; Respiratory: Negative for cough, wheezing and stridor. ; ; Gastrointestinal: Negative for nausea, vomiting, diarrhea, abdominal pain, blood in stool, hematemesis, jaundice and rectal bleeding. . ; ; Genitourinary: Negative for dysuria, flank pain and hematuria. ; ; Musculoskeletal: Negative for back pain and neck pain. Negative for swelling and trauma.; ; Skin: Negative for pruritus, rash, abrasions, blisters, bruising and skin lesion.; ; Neuro: Negative for headache, lightheadedness and neck stiffness. Negative for weakness, altered level of consciousness, altered mental status, extremity weakness, paresthesias, involuntary movement, seizure and syncope.       Physical Exam Updated Vital Signs BP 124/79   Pulse 78   Temp 98.3 F (36.8 C) (Oral)   Resp 16   Ht 5\' 11"  (1.803 m)   Wt 285 lb (129.3 kg)   SpO2 97%   BMI 39.75 kg/m   Physical Exam 1945: Physical examination:  Nursing notes reviewed; Vital signs and O2 SAT reviewed;  Constitutional: Well developed, Well nourished, Well hydrated, In no acute distress; Head:  Normocephalic, atraumatic; Eyes: EOMI, PERRL, No scleral icterus; ENMT: Mouth and pharynx normal, Mucous membranes moist; Neck: Supple, Full range of motion, No lymphadenopathy; Cardiovascular: Regular rate and rhythm, No gallop; Respiratory: Breath sounds clear & equal bilaterally, No wheezes.  Speaking full sentences with ease, Normal respiratory effort/excursion; Chest: Nontender, Movement normal; Abdomen: Soft, Nontender, Nondistended, Normal bowel sounds; Genitourinary: No CVA tenderness; Extremities: Pulses normal, No tenderness, +1 pedal edema to ankles. No calf edema or asymmetry.; Neuro: AA&Ox3, Major CN grossly intact.  Speech clear. No gross focal motor or sensory deficits  in extremities.; Skin: Color normal, Warm, Dry.   ED Treatments / Results  Labs (all labs ordered are listed, but only abnormal results are displayed)   EKG  EKG Interpretation None       Radiology   Procedures Procedures (including critical care time)  Medications Ordered in ED Medications - No data to display   Initial Impression / Assessment and Plan / ED Course  I have reviewed the triage vital signs and the nursing notes.  Pertinent labs & imaging results that were available during my care of the patient were reviewed by me and considered in my medical decision making (see chart for details).  MDM Reviewed: previous chart, nursing note and vitals Reviewed previous: labs Interpretation: labs and x-ray   Results for orders placed or performed during the hospital encounter of [id number]  Basic metabolic panel  Result Value Ref Range   Sodium 139 135 - 145 mmol/L   Potassium 4.0 3.5 - 5.1 mmol/L   Chloride 95 (L) 101 - 111 mmol/L  CO2 37 (H) 22 - 32 mmol/L   Glucose, Bld 94 65 - 99 mg/dL   BUN 21 (H) 6 - 20 mg/dL   Creatinine, Ser 1.22 0.61 - 1.24 mg/dL   Calcium 9.0 8.9 - 10.3 mg/dL   GFR calc non Af Amer >60 >60 mL/min   GFR calc Af Amer >60 >60 mL/min   Anion gap 7 5 - 15  CBC  Result Value Ref Range   WBC 7.3 4.0 - 10.5 K/uL   RBC 5.09 4.22 - 5.81 MIL/uL   Hemoglobin 15.3 13.0 - 17.0 g/dL   HCT 48.4 39.0 - 52.0 %   MCV 95.1 78.0 - 100.0 fL   MCH 30.1 26.0 - 34.0 pg   MCHC 31.6 30.0 - 36.0 g/dL   RDW 14.6 11.5 - 15.5 %   Platelets 140 (L) 150 - 400 K/uL  Brain natriuretic peptide  Result Value Ref Range   B Natriuretic Peptide 178.0 (H) 0.0 - 100.0 pg/mL   Dg Chest 2 View Result Date: 12/10/2015 CLINICAL DATA:  CHF.  Bilateral lower extremity edema. EXAM: CHEST  2 VIEW COMPARISON:  12/06/2015 FINDINGS: Cardiomegaly without edema or effusion. No consolidation or pneumothorax. Stable mediastinal contours. Spondylosis with bulky spurring. IMPRESSION:  Cardiomegaly without evidence of failure. Electronically Signed   By: Monte Fantasia M.D.   On: 12/10/2015 18:49    2040:  EPIC chart reviewed: pt's d/c weight was: 129.9 kg (286 lb 6.4 oz). Today he is 285lb (129.3kg). Pt endorses non-compliance with wearing his compression hose. Workup reassuring. Encouraged to take his meds as rx, wear his compression hose, and f/u with Cards tomorrow and PMD Friday as previously scheduled later this week. Pt verb understanding. Dx and testing d/w pt and family.  Questions answered.  Verb understanding, agreeable to d/c home with outpt f/u.   Final Clinical Impressions(s) / ED Diagnoses   Final diagnoses:  None    New Prescriptions New Prescriptions   No medications on file     Francine Graven, DO 12/14/15 [id number]

## 2015-12-10 NOTE — Discharge Instructions (Signed)
Take your usual prescriptions as previously directed. Go to your Cardiologist appointment tomorrow as previously scheduled.  Call your regular medical doctor tomorrow to confirm your previously scheduled follow up appointment for this week.  Return to the Emergency Department immediately sooner if worsening.

## 2015-12-10 NOTE — ED Triage Notes (Signed)
Pt discharged from hospital on Sunday. Pt had been admitted for fluid overload per family. Pt returns today with edema in his feet and legs.

## 2015-12-11 ENCOUNTER — Encounter: Payer: Self-pay | Admitting: Cardiology

## 2015-12-11 ENCOUNTER — Ambulatory Visit (INDEPENDENT_AMBULATORY_CARE_PROVIDER_SITE_OTHER): Payer: Medicaid Other | Admitting: Cardiology

## 2015-12-11 VITALS — BP 135/99 | HR 86 | Ht 71.0 in | Wt 287.8 lb

## 2015-12-11 DIAGNOSIS — K219 Gastro-esophageal reflux disease without esophagitis: Secondary | ICD-10-CM

## 2015-12-11 DIAGNOSIS — I4891 Unspecified atrial fibrillation: Secondary | ICD-10-CM

## 2015-12-11 DIAGNOSIS — I5042 Chronic combined systolic (congestive) and diastolic (congestive) heart failure: Secondary | ICD-10-CM

## 2015-12-11 DIAGNOSIS — I340 Nonrheumatic mitral (valve) insufficiency: Secondary | ICD-10-CM | POA: Diagnosis not present

## 2015-12-11 DIAGNOSIS — Z23 Encounter for immunization: Secondary | ICD-10-CM | POA: Diagnosis not present

## 2015-12-11 DIAGNOSIS — I10 Essential (primary) hypertension: Secondary | ICD-10-CM

## 2015-12-11 MED ORDER — FUROSEMIDE 40 MG PO TABS: ORAL_TABLET | ORAL | 3 refills | Status: DC

## 2015-12-11 NOTE — Progress Notes (Signed)
Clinical Summary Dennis Swanson is a 60 y.o.male seen today for follow up of the following medical problems.   1. Afib - no palpitations since last visit, denies any bleeding on issues on anticoagulation with xarelto.   2. Chronic systolic/diastolic HF - echo [id number] LVEF 40-45% - 04/2015 LVEF [id number], grade I diastolic dysfunction.    - admit 11/2015 with volume overload. IV diuresed, discharge weight 286 lbs.  - seen in ER yesterday with leg swelling. Weight 285 lbs.  - previous clinic weight around 292 lbs - still with some LE edema. Has had some SOB.  - compliant with meds.   3. Mitral regurgitation - mild to mod by echo 2011 - repeat echo 04/2015 MR is mild.    4. HTN - compliant with meds  5. GERD - fullness/burning feeling in chest often after meals   Past Medical History:  Diagnosis Date  . Acute pulmonary embolism ([place]) 2011  . Alcohol abuse   . Atrial flutter ([place])   . CHF (congestive heart failure) ([place])   . Hypertension   . Lower extremity cellulitis    bilateral  . Systolic dysfunction      No Known Allergies   Current Outpatient Prescriptions  Medication Sig Dispense Refill  . albuterol (PROVENTIL HFA;VENTOLIN HFA) 108 (90 Base) MCG/ACT inhaler Inhale 1-2 puffs into the lungs every 6 (six) hours as needed for wheezing or shortness of breath. 1 Inhaler 0  . atorvastatin (LIPITOR) 40 MG tablet Take 1 tablet (40 mg total) by mouth daily at 6 PM. 30 tablet 0  . diltiazem (CARDIZEM CD) 240 MG 24 hr capsule Take 1 capsule (240 mg total) by mouth daily. 30 capsule 0  . furosemide (LASIX) 40 MG tablet Take 1 tablet (40 mg total) by mouth daily. 90 tablet 3  . lisinopril (PRINIVIL,ZESTRIL) 40 MG tablet Take 1 tablet (40 mg total) by mouth daily. 30 tablet 0  . metFORMIN (GLUCOPHAGE) 500 MG tablet Take 1 tablet (500 mg total) by mouth 2 (two) times daily with a meal. 180 tablet 3  . metoprolol succinate (TOPROL-XL) 100 MG 24 hr tablet Take 1 tablet  (100 mg total) by mouth daily. Take with or immediately following a meal. 90 tablet 3  . metoprolol succinate (TOPROL-XL) 50 MG 24 hr tablet Take 1 tablet (50 mg total) by mouth daily. Take with or immediately following a meal. 90 tablet 3  . potassium chloride (KLOR-CON) 20 MEQ packet Take 20 mEq by mouth daily. 30 tablet 11  . rivaroxaban (XARELTO) 20 MG TABS tablet Take 1 tablet (20 mg total) by mouth daily with supper. 30 tablet 0   No current facility-administered medications for this visit.      Past Surgical History:  Procedure Laterality Date  . COLONOSCOPY N/A 05/17/2012   Procedure: COLONOSCOPY;  Surgeon: Danie Binder, MD;  Location: [facility];  Service: Endoscopy;  Laterality: N/A;  10:30  . None       No Known Allergies    Family History  Problem Relation Age of Onset  . Heart attack Mother   . Hypertension Brother   . Diabetes Brother   . Colon cancer Neg Hx      Social History Dennis Swanson reports that he has never smoked. He has never used smokeless tobacco. Dennis Swanson reports that he drinks about 1.8 oz of alcohol per week .   Review of Systems CONSTITUTIONAL: No weight loss, fever, chills, weakness or fatigue.  HEENT: Eyes:  No visual loss, blurred vision, double vision or yellow sclerae.No hearing loss, sneezing, congestion, runny nose or sore throat.  SKIN: No rash or itching.  CARDIOVASCULAR: per HPI RESPIRATORY: No shortness of breath, cough or sputum.  GASTROINTESTINAL:per HPI GENITOURINARY: No burning on urination, no polyuria NEUROLOGICAL: No headache, dizziness, syncope, paralysis, ataxia, numbness or tingling in the extremities. No change in bowel or bladder control.  MUSCULOSKELETAL: No muscle, back pain, joint pain or stiffness.  LYMPHATICS: No enlarged nodes. No history of splenectomy.  PSYCHIATRIC: No history of depression or anxiety.  ENDOCRINOLOGIC: No reports of sweating, cold or heat intolerance. No polyuria or polydipsia.   Marland Kitchen   Physical Examination Vitals:   12/11/15 1416 12/11/15 1419  BP: (!) 142/101 (!) 135/99  Pulse: 81 86   Vitals:   12/11/15 1416  Weight: 287 lb 12.8 oz (130.5 kg)  Height: 5\' 11"  (1.803 m)    Gen: resting comfortably, no acute distress HEENT: no scleral icterus, pupils equal round and reactive, no palptable cervical adenopathy,  CV: RRR, no m/r/g, no jvd Resp: Clear to auscultation bilaterally GI: abdomen is soft, non-tender, non-distended, normal bowel sounds, no hepatosplenomegaly MSK: extremities are warm, 1+ bilatearl LE edema Skin: warm, no rash Neuro:  no focal deficits Psych: appropriate affect   Diagnostic Studies Study Conclusions  - Left ventricle: The cavity size was normal. Wall thickness was  increased in a pattern of moderate LVH. Systolic function was  normal. The estimated ejection fraction was in the range of 60%  to 65%. Doppler parameters are consistent with abnormal left  ventricular relaxation (grade 1 diastolic dysfunction). - Aortic valve: Mildly calcified annulus. Trileaflet; mildly  thickened leaflets. Valve area (VTI): 2.5 cm^2. Valve area  (Vmax): 2.38 cm^2. - Mitral valve: Mildly calcified annulus. Mildly thickened leaflets  . There was mild regurgitation. - Technically adequate study.    Assessment and Plan   1. Afib - no current symptoms - he will continue current meds. CHADS2Vasc score is 3, continue anticoag  2. Chronic systolic/diastolic HF - LVEF has normalized, continues to have some diastolic dysfunction. Evidence of fluid overload.  - we will increase lasix to 40mg  in AM and 20mg  pm, order compression stockings.  - check BMET/Mg in 2 weeks  3. Mitral regurgitation - mild by recent echo, we will continue to monitor  4. HTN - above goal given his history of DM2, follow with increased diuresis  5. GERD - recommended OTC zantac 150mg  bid   F/u 4 weeks PA in [place].   F/u 6 months     Arnoldo Lenis, M.D., F.A.C.C.

## 2015-12-11 NOTE — Patient Instructions (Addendum)
Your physician recommends that you schedule a follow-up appointment in: [place] [facility] IN [facility] OFFICE   Your physician has recommended you make the following change in your medication:   TAKE 40 MG OF LASIX IN MORNING AND 20 MG IN THE EVENING   TAKE OTC ZANTAC 150 MG TWICE DAILY  Your physician recommends that you return for lab work in: [place] BMP/MG  Your physician recommends that you weigh, daily, at the same time every day, and in the same amount of clothing. Please record your daily weights on the handout provided and bring it to your next appointment.  Thank you for choosing [facility]!!

## 2015-12-12 DIAGNOSIS — Z23 Encounter for immunization: Secondary | ICD-10-CM

## 2016-01-13 ENCOUNTER — Ambulatory Visit (INDEPENDENT_AMBULATORY_CARE_PROVIDER_SITE_OTHER): Payer: Medicaid Other | Admitting: Adult Health

## 2016-01-13 ENCOUNTER — Encounter: Payer: Self-pay | Admitting: Adult Health

## 2016-01-13 VITALS — BP 124/74 | HR 98 | Ht 71.5 in | Wt 280.0 lb

## 2016-01-13 DIAGNOSIS — I4821 Permanent atrial fibrillation: Secondary | ICD-10-CM

## 2016-01-13 DIAGNOSIS — I482 Chronic atrial fibrillation: Secondary | ICD-10-CM

## 2016-01-13 DIAGNOSIS — I10 Essential (primary) hypertension: Secondary | ICD-10-CM | POA: Diagnosis not present

## 2016-01-13 DIAGNOSIS — I5032 Chronic diastolic (congestive) heart failure: Secondary | ICD-10-CM | POA: Diagnosis not present

## 2016-01-13 MED ORDER — METOPROLOL SUCCINATE ER 100 MG PO TB24: 100.0000 mg | ORAL_TABLET | Freq: Every day | ORAL | 3 refills | Status: DC

## 2016-01-13 MED ORDER — RIVAROXABAN 20 MG PO TABS: 20.0000 mg | ORAL_TABLET | Freq: Every day | ORAL | 11 refills | Status: DC

## 2016-01-13 MED ORDER — DILTIAZEM HCL ER COATED BEADS 240 MG PO CP24
240.0000 mg | ORAL_CAPSULE | Freq: Every day | ORAL | 11 refills | Status: DC
Start: 2016-01-13 — End: 2017-02-08

## 2016-01-13 MED ORDER — METOPROLOL SUCCINATE ER 50 MG PO TB24: 50.0000 mg | ORAL_TABLET | Freq: Every day | ORAL | 3 refills | Status: DC

## 2016-01-13 MED ORDER — LISINOPRIL 40 MG PO TABS: 40.0000 mg | ORAL_TABLET | Freq: Every day | ORAL | 11 refills | Status: DC

## 2016-01-13 MED ORDER — ATORVASTATIN CALCIUM 40 MG PO TABS: 40.0000 mg | ORAL_TABLET | Freq: Every day | ORAL | 11 refills | Status: DC

## 2016-01-13 NOTE — Progress Notes (Deleted)
Name: Dennis Swanson    DOB: 01/21/1956  Age: 60 y.o.  MR#: [id number]       PCP:  Rosita Fire, MD      Insurance: Payor: MEDICAID [place] / Plan: MEDICAID [place] ACCESS / Product Type: *No Product type* /   CC:   No chief complaint on file.   VS Vitals:   01/13/16 1348  Weight: 280 lb (127 kg)  Height: 5' 11.5" (1.816 m)    Weights Current Weight  01/13/16 280 lb (127 kg)  12/11/15 287 lb 12.8 oz (130.5 kg)  12/10/15 285 lb (129.3 kg)    Blood Pressure  BP Readings from Last 3 Encounters:  12/11/15 (!) 135/99  12/10/15 124/79  12/08/15 127/74     Admit date:  (Not on file) Last encounter with RMR:  Visit date not found   Allergy Patient has no known allergies.  Current Outpatient Prescriptions  Medication Sig Dispense Refill  . albuterol (PROVENTIL HFA;VENTOLIN HFA) 108 (90 Base) MCG/ACT inhaler Inhale 1-2 puffs into the lungs every 6 (six) hours as needed for wheezing or shortness of breath. 1 Inhaler 0  . atorvastatin (LIPITOR) 40 MG tablet Take 1 tablet (40 mg total) by mouth daily at 6 PM. 30 tablet 0  . diltiazem (CARDIZEM CD) 240 MG 24 hr capsule Take 1 capsule (240 mg total) by mouth daily. 30 capsule 0  . furosemide (LASIX) 40 MG tablet TAKE 1 TAB IN THE MORNING AND 1/2 TAB EVENING 135 tablet 3  . lisinopril (PRINIVIL,ZESTRIL) 40 MG tablet Take 1 tablet (40 mg total) by mouth daily. 30 tablet 0  . metFORMIN (GLUCOPHAGE) 500 MG tablet Take 1 tablet (500 mg total) by mouth 2 (two) times daily with a meal. 180 tablet 3  . metoprolol succinate (TOPROL-XL) 100 MG 24 hr tablet Take 1 tablet (100 mg total) by mouth daily. Take with or immediately following a meal. 90 tablet 3  . metoprolol succinate (TOPROL-XL) 50 MG 24 hr tablet Take 1 tablet (50 mg total) by mouth daily. Take with or immediately following a meal. 90 tablet 3  . potassium chloride (KLOR-CON) 20 MEQ packet Take 20 mEq by mouth daily. 30 tablet 11  . ranitidine (ZANTAC) 150 MG capsule Take 150 mg by mouth  2 (two) times daily.    . rivaroxaban (XARELTO) 20 MG TABS tablet Take 1 tablet (20 mg total) by mouth daily with supper. 30 tablet 0   No current facility-administered medications for this visit.     Discontinued Meds:   There are no discontinued medications.  Patient Active Problem List   Diagnosis Date Noted  . Acute exacerbation of CHF (congestive heart failure) ([place]) 12/06/2015  . Encounter for therapeutic drug monitoring 04/10/2015  . Atrial fibrillation ([place]) 04/03/2015  . Nonischemic cardiomyopathy ([place]) 04/03/2015  . Current use of long term anticoagulation 04/03/2015  . Encounter for screening colonoscopy 04/27/2012  . Essential hypertension 04/27/2012  . CHF 12/17/2009  . ALCOHOL ABUSE 09/27/2009    LABS    Component Value Date/Time   NA 139 12/10/2015 1945   NA 140 12/08/2015 0519   NA 142 12/07/2015 0554   K 4.0 12/10/2015 1945   K 4.2 12/08/2015 0519   K 5.0 12/07/2015 0554   CL 95 (L) 12/10/2015 1945   CL 92 (L) 12/08/2015 0519   CL 92 (L) 12/07/2015 0554   CO2 37 (H) 12/10/2015 1945   CO2 40 (H) 12/08/2015 0519   CO2 43 (H) 12/07/2015 0554  GLUCOSE 94 12/10/2015 1945   GLUCOSE 130 (H) 12/08/2015 0519   GLUCOSE 116 (H) 12/07/2015 0554   BUN 21 (H) 12/10/2015 1945   BUN 17 12/08/2015 0519   BUN 16 12/07/2015 0554   CREATININE 1.22 12/10/2015 1945   CREATININE 1.28 (H) 12/08/2015 0519   CREATININE 1.28 (H) 12/07/2015 0554   CREATININE 1.28 (H) 07/08/2015 1023   CREATININE 1.17 06/06/2015 1239   CALCIUM 9.0 12/10/2015 1945   CALCIUM 8.9 12/08/2015 0519   CALCIUM 9.0 12/07/2015 0554   GFRNONAA >60 12/10/2015 1945   GFRNONAA 59 (L) 12/08/2015 0519   GFRNONAA 59 (L) 12/07/2015 0554   GFRAA >60 12/10/2015 1945   GFRAA >60 12/08/2015 0519   GFRAA >60 12/07/2015 0554   CMP     Component Value Date/Time   NA 139 12/10/2015 1945   K 4.0 12/10/2015 1945   CL 95 (L) 12/10/2015 1945   CO2 37 (H) 12/10/2015 1945   GLUCOSE 94 12/10/2015 1945   BUN 21  (H) 12/10/2015 1945   CREATININE 1.22 12/10/2015 1945   CREATININE 1.28 (H) 07/08/2015 1023   CALCIUM 9.0 12/10/2015 1945   PROT 6.3 (L) 12/06/2015 0226   ALBUMIN 3.4 (L) 12/06/2015 0226   AST 20 12/06/2015 0226   ALT 39 12/06/2015 0226   ALKPHOS 47 12/06/2015 0226   BILITOT 0.5 12/06/2015 0226   GFRNONAA >60 12/10/2015 1945   GFRAA >60 12/10/2015 1945       Component Value Date/Time   WBC 7.3 12/10/2015 1945   WBC 8.9 12/06/2015 0226   WBC 7.4 07/08/2015 1006   HGB 15.3 12/10/2015 1945   HGB 15.2 12/06/2015 0226   HGB 13.7 07/08/2015 1006   HCT 48.4 12/10/2015 1945   HCT 48.7 12/06/2015 0226   HCT 42.8 07/08/2015 1006   MCV 95.1 12/10/2015 1945   MCV 96.2 12/06/2015 0226   MCV 91.1 07/08/2015 1006    Lipid Panel     Component Value Date/Time   CHOL  08/21/2009 0552    143        ATP III CLASSIFICATION:  <200     mg/dL   Desirable  200-239  mg/dL   Borderline High  >=240    mg/dL   High          TRIG 66 08/21/2009 0552   HDL 31 (L) 08/21/2009 0552   CHOLHDL 4.6 08/21/2009 0552   VLDL 13 08/21/2009 0552   LDLCALC  08/21/2009 0552    99        Total Cholesterol/HDL:CHD Risk Coronary Heart Disease Risk Table                     Men   Women  1/2 Average Risk   3.4   3.3  Average Risk       5.0   4.4  2 X Average Risk   9.6   7.1  3 X Average Risk  23.4   11.0        Use the calculated Patient Ratio above and the CHD Risk Table to determine the patient's CHD Risk.        ATP III CLASSIFICATION (LDL):  <100     mg/dL   Optimal  100-129  mg/dL   Near or Above                    Optimal  130-159  mg/dL   Borderline  160-189  mg/dL   High  >190  mg/dL   Very High    ABG No results found for: PHART, PCO2ART, PO2ART, HCO3, TCO2, ACIDBASEDEF, O2SAT   Lab Results  Component Value Date   TSH 1.940 ***Test methodology is 3rd generation TSH*** 08/20/2009   BNP (last 3 results)  Recent Labs  12/06/15 0226 12/10/15 1945  BNP 174.0* 178.0*    ProBNP  (last 3 results) No results for input(s): PROBNP in the last 8760 hours.  Cardiac Panel (last 3 results) No results for input(s): CKTOTAL, CKMB, TROPONINI, RELINDX in the last 72 hours.  Iron/TIBC/Ferritin/ %Sat No results found for: IRON, TIBC, FERRITIN, IRONPCTSAT   EKG Orders placed or performed during the hospital encounter of 12/06/15  . EKG 12-Lead  . EKG 12-Lead  . EKG     Prior Assessment and Plan Problem List as of 01/13/2016 Reviewed: 12/12/2015 11:12 AM by Carlyle Dolly, MD     Cardiovascular and Mediastinum   CHF   Essential hypertension   Last Assessment & Plan 04/03/2015 Office Visit Written 04/03/2015  2:46 PM by Imogene Burn, PA-C     Blood pressure stable      Atrial fibrillation [facility])   Last Assessment & Plan 04/03/2015 Office Visit Edited 04/03/2015  2:59 PM by Imogene Burn, PA-C     Patient has chronic atrial fibrillation and has been on Coumadin until he ran out 2 weeks ago. He was followed here until 2012 and then was being followed at [facility]. He also ran out of his digoxin. His heart rate is 124 bpm but he was also some placed on inhalers yesterday for wheezing when he was diagnosed with the flu. He also ran out of his digoxin. We'll resume digoxin 0.25 mg daily. Increase diltiazem to 240 mg daily. Resume Coumadin 2.5 mg daily in be checked and the Coumadin clinic by Lattie Haw next Wednesday. I talked to him about starting one of the NOAC's but  He will just stay on Coumadin since it's worked well for him over the years. Will set him up to see Dr. Harl Bowie in follow-up to become established.  This patients CHA2DS2-VASc Score and unadjusted Ischemic Stroke Rate (% per year) is equal to 4.8 % stroke rate/year from a score of 4  Above score calculated as 1 point each if present [CHF, HTN, DM, Vascular=MI/PAD/Aortic Plaque, Age if 65-74, or Male] Above score calculated as 2 points each if present [Age > 75, or Stroke/TIA/TE]        Nonischemic  cardiomyopathy [facility])   Last Assessment & Plan 04/03/2015 Office Visit Written 04/03/2015  2:46 PM by Imogene Burn, PA-C     Patient has history of nonischemic cardiomyopathy EF 40-45% in 2011 on echo. Patient has not had any trouble with heart failure over the years.  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 He continues to drink about a pint every weekend. Recommend decrease alcohol intake.      Acute exacerbation of CHF (congestive heart failure) ([place])     Other   ALCOHOL ABUSE   Encounter for screening colonoscopy   Last Assessment & Plan 04/27/2012 Office Visit Written 04/27/2012  8:46 AM by Orvil Feil, NP    60 year old male presents with need for initial screening colonoscopy. He has noted scant hematochezia with straining, which is likely benign. He has no lower or upper GI symptoms. His blood pressure is significantly elevated today, and he tells me he has been without Toprol XL X 1 week. We are attempting to have him see  his primary care doctor today for further management. He is asymptomatic currently. He is also on Coumadin, with a history of aflutter and a PE. He will need Lovenox bridging prior to procedure.  Proceed with colonoscopy with Dr. Oneida Alar in the near future. The risks, benefits, and alternatives have been discussed in detail with the patient. They state understanding and desire to proceed. This will be scheduled about 4 weeks out to ensure his BP issues have been addressed. Hold Coumadin X 3 days, lovenox bridging. We will call patient with specific instructions.        Current use of long term anticoagulation   Last Assessment & Plan 04/03/2015 Office Visit Written 04/03/2015  2:48 PM by Imogene Burn, PA-C     Patient has been on Coumadin for chronic atrial fibrillation since 2012. He also had DVT and pulmonary embolus in 2011. He ran out of his Coumadin 2 weeks ago because of couldn't be refilled. He was not being followed here since 2012. Discussed with Christella Noa and  will start him at 2.5 mg daily since he is on antibiotics and steroids. He will be seen in our Coumadin clinic next Wednesday.      Encounter for therapeutic drug monitoring       Imaging: No results found.

## 2016-01-13 NOTE — Patient Instructions (Signed)
Your physician wants you to follow-up in: 6 Months with Dr. Harl Bowie. You will receive a reminder letter in the mail two months in advance. If you don't receive a letter, please call our office to schedule the follow-up appointment.  Your physician recommends that you continue on your current medications as directed. Please refer to the Current Medication list given to you today.  Please have copy of labs sent to our office. (BMET, CBC)   If you need a refill on your cardiac medications before your next appointment, please call your pharmacy.  Thank you for choosing [facility]!

## 2016-01-13 NOTE — Progress Notes (Signed)
Cardiology Office Note   Date:  01/13/2016   ID:  Dennis, Swanson 04/16/1955, MRN [id number]  PCP:  Rosita Fire, MD  Cardiologist:  Cloria Spring, NP   No chief complaint on file.     History of Present Illness: Dennis Swanson is a 60 y.o. male who presents for ongoing assessment and management of atrial fibrillation, chronic systolic and diastolic heart failure, admission in October 2017 for decompensation, history of hypertension, mitral regurgitation, and GERD. The patient was last seen in the office by Dr. Harl Bowie in October 2017 for post hospitalization follow-up. The patient was continued on diuretics with increased dose of Lasix 40 mg in the a.m. and 20 mg in the p.m., compression stockings were ordered, a follow-up BMET and magnesium in 2 weeks.  Follow-up labs on 12/10/2015 revealed sodium of 139 potassium 4.0 chloride 95 CO2 37 BUN 21 creatinine 1.22.  He is without complaint today. His weight is down approximately 7 pounds. He continues to weigh daily, denies any issues with breathing, fluid retention, or racing heart rate.  Past Medical History:  Diagnosis Date  . Acute pulmonary embolism ([place]) 2011  . Alcohol abuse   . Atrial flutter ([place])   . CHF (congestive heart failure) ([place])   . Hypertension   . Lower extremity cellulitis    bilateral  . Systolic dysfunction     Past Surgical History:  Procedure Laterality Date  . COLONOSCOPY N/A 05/17/2012   Procedure: COLONOSCOPY;  Surgeon: Danie Binder, MD;  Location[facility];  Service: Endoscopy;  Laterality: N/A;  10:30  . None       Current Outpatient Prescriptions  Medication Sig Dispense Refill  . albuterol (PROVENTIL HFA;VENTOLIN HFA) 108 (90 Base) MCG/ACT inhaler Inhale 1-2 puffs into the lungs every 6 (six) hours as needed for wheezing or shortness of breath. 1 Inhaler 0  . atorvastatin (LIPITOR) 40 MG tablet Take 1 tablet (40 mg total) by mouth daily at 6 PM. 30 tablet 0  .  diltiazem (CARDIZEM CD) 240 MG 24 hr capsule Take 1 capsule (240 mg total) by mouth daily. 30 capsule 0  . furosemide (LASIX) 40 MG tablet TAKE 1 TAB IN THE MORNING AND 1/2 TAB EVENING 135 tablet 3  . lisinopril (PRINIVIL,ZESTRIL) 40 MG tablet Take 1 tablet (40 mg total) by mouth daily. 30 tablet 0  . metFORMIN (GLUCOPHAGE) 500 MG tablet Take 1 tablet (500 mg total) by mouth 2 (two) times daily with a meal. 180 tablet 3  . metoprolol succinate (TOPROL-XL) 100 MG 24 hr tablet Take 1 tablet (100 mg total) by mouth daily. Take with or immediately following a meal. 90 tablet 3  . metoprolol succinate (TOPROL-XL) 50 MG 24 hr tablet Take 1 tablet (50 mg total) by mouth daily. Take with or immediately following a meal. 90 tablet 3  . potassium chloride (KLOR-CON) 20 MEQ packet Take 20 mEq by mouth daily. 30 tablet 11  . ranitidine (ZANTAC) 150 MG capsule Take 150 mg by mouth 2 (two) times daily.    . rivaroxaban (XARELTO) 20 MG TABS tablet Take 1 tablet (20 mg total) by mouth daily with supper. 30 tablet 0   No current facility-administered medications for this visit.     Allergies:   Patient has no known allergies.    Social History:  The patient  reports that he has never smoked. He has never used smokeless tobacco. He reports that he drinks about 1.8 oz of alcohol per week .  He reports that he does not use drugs.   Family History:  The patient's family history includes Diabetes in his brother; Heart attack in his mother; Hypertension in his brother.    ROS: All other systems are reviewed and negative. Unless otherwise mentioned in H&P    PHYSICAL EXAM: VS:  BP 124/74   Pulse 98   Ht 5' 11.5" (1.816 m)   Wt 280 lb (127 kg)   SpO2 98%   BMI 38.51 kg/m  , BMI Body mass index is 38.51 kg/m. GEN: Well nourished, well developed, in no acute distress  HEENT: normal  Neck: no JVD, carotid bruits, or masses Cardiac: IRRR; no murmurs, rubs, or gallops,no edema  Respiratory:  clear to  auscultation bilaterally, normal work of breathing GI: soft, nontender, nondistended, + BS. Obese MS: no deformity or atrophy  Skin: warm and dry, no rash Neuro:  Strength and sensation are intact Psych: euthymic mood, full affect   Recent Labs: 12/06/2015: ALT 39 12/10/2015: B Natriuretic Peptide 178.0; BUN 21; Creatinine, Ser 1.22; Hemoglobin 15.3; Platelets 140; Potassium 4.0; Sodium 139    Lipid Panel    Component Value Date/Time   CHOL  08/21/2009 0552    143        ATP III CLASSIFICATION:  <200     mg/dL   Desirable  200-239  mg/dL   Borderline High  >=240    mg/dL   High          TRIG 66 08/21/2009 0552   HDL 31 (L) 08/21/2009 0552   CHOLHDL 4.6 08/21/2009 0552   VLDL 13 08/21/2009 0552   LDLCALC  08/21/2009 0552    99        Total Cholesterol/HDL:CHD Risk Coronary Heart Disease Risk Table                     Men   Women  1/2 Average Risk   3.4   3.3  Average Risk       5.0   4.4  2 X Average Risk   9.6   7.1  3 X Average Risk  23.4   11.0        Use the calculated Patient Ratio above and the CHD Risk Table to determine the patient's CHD Risk.        ATP III CLASSIFICATION (LDL):  <100     mg/dL   Optimal  100-129  mg/dL   Near or Above                    Optimal  130-159  mg/dL   Borderline  160-189  mg/dL   High  >190     mg/dL   Very High      Wt Readings from Last 3 Encounters:  01/13/16 280 lb (127 kg)  12/11/15 287 lb 12.8 oz (130.5 kg)  12/10/15 285 lb (129.3 kg)     ASSESSMENT AND PLAN:  1. Atrial fibrillation: Heart rate is currently well controlled. He remains on diltiazem and metoprolol, along with Xarelto. He denies any bleeding problems. I will provide refills on diltiazem and metoprolol and Xarelto.  2. Chronic diastolic CHF: There is no evidence of decompensation. Weight is well controlled. He will continue daily weights and low sodium diet. He is been counseled on this to reinforce this issue especially as this is holiday time and he  needs to avoid a lot of the holiday foods that are heavily salt laden. He  verbalizes understanding.  3. Hypertension: Excellent control of blood pressure. No changes in his medications. Follow-up labs are being completed by his primary care physician next week. We will need a BMET in the CBC result. We will receive a copy once resulted.  4. Hypercholesterolemia: Continue statin therapy with Lipitor 40 mg daily. Follow-up labs will need to be completed within 6 months.   Current medicines are reviewed at length with the patient today.    Labs/ tests ordered today include:  No orders of the defined types were placed in this encounter.    Disposition:   FU with 6 months unless symptomatic.  Signed, Jory Sims, NP  01/13/2016 2:05 PM    [facility] [address], [place], [place] [address] Phone: [phone]; Fax: [phone]

## 2016-01-13 NOTE — Progress Notes (Deleted)
Name: Dennis Swanson    DOB: 1955-04-14  Age: 60 y.o.  MR#: [id number]       PCP:  Rosita Fire, MD      Insurance: Payor: MEDICAID [place] / Plan: MEDICAID [place] ACCESS / Product Type: *No Product type* /   CC:    Chief Complaint  Patient presents with  . Atrial Fibrillation  . Congestive Heart Failure  . Hypertension    VS Vitals:   01/13/16 1348  BP: 124/74  Pulse: 98  SpO2: 98%  Weight: 280 lb (127 kg)  Height: 5' 11.5" (1.816 m)    Weights Current Weight  01/13/16 280 lb (127 kg)  12/11/15 287 lb 12.8 oz (130.5 kg)  12/10/15 285 lb (129.3 kg)    Blood Pressure  BP Readings from Last 3 Encounters:  01/13/16 124/74  12/11/15 (!) 135/99  12/10/15 124/79     Admit date:  (Not on file) Last encounter with RMR:  Visit date not found   Allergy Patient has no known allergies.  Current Outpatient Prescriptions  Medication Sig Dispense Refill  . albuterol (PROVENTIL HFA;VENTOLIN HFA) 108 (90 Base) MCG/ACT inhaler Inhale 1-2 puffs into the lungs every 6 (six) hours as needed for wheezing or shortness of breath. 1 Inhaler 0  . atorvastatin (LIPITOR) 40 MG tablet Take 1 tablet (40 mg total) by mouth daily at 6 PM. 30 tablet 11  . diltiazem (CARDIZEM CD) 240 MG 24 hr capsule Take 1 capsule (240 mg total) by mouth daily. 30 capsule 11  . furosemide (LASIX) 40 MG tablet TAKE 1 TAB IN THE MORNING AND 1/2 TAB EVENING 135 tablet 3  . lisinopril (PRINIVIL,ZESTRIL) 40 MG tablet Take 1 tablet (40 mg total) by mouth daily. 30 tablet 11  . metFORMIN (GLUCOPHAGE) 500 MG tablet Take 1 tablet (500 mg total) by mouth 2 (two) times daily with a meal. 180 tablet 3  . metoprolol succinate (TOPROL-XL) 100 MG 24 hr tablet Take 1 tablet (100 mg total) by mouth daily. Take with or immediately following a meal. 90 tablet 3  . metoprolol succinate (TOPROL-XL) 50 MG 24 hr tablet Take 1 tablet (50 mg total) by mouth daily. Take with or immediately following a meal. 90 tablet 3  . potassium chloride  (KLOR-CON) 20 MEQ packet Take 20 mEq by mouth daily. 30 tablet 11  . ranitidine (ZANTAC) 150 MG capsule Take 150 mg by mouth 2 (two) times daily.    . rivaroxaban (XARELTO) 20 MG TABS tablet Take 1 tablet (20 mg total) by mouth daily with supper. 30 tablet 11   No current facility-administered medications for this visit.     Discontinued Meds:    Medications Discontinued During This Encounter  Medication Reason  . atorvastatin (LIPITOR) 40 MG tablet Reorder  . diltiazem (CARDIZEM CD) 240 MG 24 hr capsule Reorder  . lisinopril (PRINIVIL,ZESTRIL) 40 MG tablet Reorder  . metoprolol succinate (TOPROL-XL) 100 MG 24 hr tablet Reorder  . metoprolol succinate (TOPROL-XL) 50 MG 24 hr tablet Reorder  . rivaroxaban (XARELTO) 20 MG TABS tablet Reorder    Patient Active Problem List   Diagnosis Date Noted  . Acute exacerbation of CHF (congestive heart failure) ([place]) 12/06/2015  . Encounter for therapeutic drug monitoring 04/10/2015  . Atrial fibrillation ([place]) 04/03/2015  . Nonischemic cardiomyopathy ([place]) 04/03/2015  . Current use of long term anticoagulation 04/03/2015  . Encounter for screening colonoscopy 04/27/2012  . Essential hypertension 04/27/2012  . CHF 12/17/2009  . ALCOHOL ABUSE 09/27/2009  LABS    Component Value Date/Time   NA 139 12/10/2015 1945   NA 140 12/08/2015 0519   NA 142 12/07/2015 0554   K 4.0 12/10/2015 1945   K 4.2 12/08/2015 0519   K 5.0 12/07/2015 0554   CL 95 (L) 12/10/2015 1945   CL 92 (L) 12/08/2015 0519   CL 92 (L) 12/07/2015 0554   CO2 37 (H) 12/10/2015 1945   CO2 40 (H) 12/08/2015 0519   CO2 43 (H) 12/07/2015 0554   GLUCOSE 94 12/10/2015 1945   GLUCOSE 130 (H) 12/08/2015 0519   GLUCOSE 116 (H) 12/07/2015 0554   BUN 21 (H) 12/10/2015 1945   BUN 17 12/08/2015 0519   BUN 16 12/07/2015 0554   CREATININE 1.22 12/10/2015 1945   CREATININE 1.28 (H) 12/08/2015 0519   CREATININE 1.28 (H) 12/07/2015 0554   CREATININE 1.28 (H) 07/08/2015 1023    CREATININE 1.17 06/06/2015 1239   CALCIUM 9.0 12/10/2015 1945   CALCIUM 8.9 12/08/2015 0519   CALCIUM 9.0 12/07/2015 0554   GFRNONAA >60 12/10/2015 1945   GFRNONAA 59 (L) 12/08/2015 0519   GFRNONAA 59 (L) 12/07/2015 0554   GFRAA >60 12/10/2015 1945   GFRAA >60 12/08/2015 0519   GFRAA >60 12/07/2015 0554   CMP     Component Value Date/Time   NA 139 12/10/2015 1945   K 4.0 12/10/2015 1945   CL 95 (L) 12/10/2015 1945   CO2 37 (H) 12/10/2015 1945   GLUCOSE 94 12/10/2015 1945   BUN 21 (H) 12/10/2015 1945   CREATININE 1.22 12/10/2015 1945   CREATININE 1.28 (H) 07/08/2015 1023   CALCIUM 9.0 12/10/2015 1945   PROT 6.3 (L) 12/06/2015 0226   ALBUMIN 3.4 (L) 12/06/2015 0226   AST 20 12/06/2015 0226   ALT 39 12/06/2015 0226   ALKPHOS 47 12/06/2015 0226   BILITOT 0.5 12/06/2015 0226   GFRNONAA >60 12/10/2015 1945   GFRAA >60 12/10/2015 1945       Component Value Date/Time   WBC 7.3 12/10/2015 1945   WBC 8.9 12/06/2015 0226   WBC 7.4 07/08/2015 1006   HGB 15.3 12/10/2015 1945   HGB 15.2 12/06/2015 0226   HGB 13.7 07/08/2015 1006   HCT 48.4 12/10/2015 1945   HCT 48.7 12/06/2015 0226   HCT 42.8 07/08/2015 1006   MCV 95.1 12/10/2015 1945   MCV 96.2 12/06/2015 0226   MCV 91.1 07/08/2015 1006    Lipid Panel     Component Value Date/Time   CHOL  08/21/2009 0552    143        ATP III CLASSIFICATION:  <200     mg/dL   Desirable  200-239  mg/dL   Borderline High  >=240    mg/dL   High          TRIG 66 08/21/2009 0552   HDL 31 (L) 08/21/2009 0552   CHOLHDL 4.6 08/21/2009 0552   VLDL 13 08/21/2009 0552   LDLCALC  08/21/2009 0552    99        Total Cholesterol/HDL:CHD Risk Coronary Heart Disease Risk Table                     Men   Women  1/2 Average Risk   3.4   3.3  Average Risk       5.0   4.4  2 X Average Risk   9.6   7.1  3 X Average Risk  23.4   11.0  Use the calculated Patient Ratio above and the CHD Risk Table to determine the patient's CHD Risk.         ATP III CLASSIFICATION (LDL):  <100     mg/dL   Optimal  100-129  mg/dL   Near or Above                    Optimal  130-159  mg/dL   Borderline  160-189  mg/dL   High  >190     mg/dL   Very High    ABG No results found for: PHART, PCO2ART, PO2ART, HCO3, TCO2, ACIDBASEDEF, O2SAT   Lab Results  Component Value Date   TSH 1.940 ***Test methodology is 3rd generation TSH*** 08/20/2009   BNP (last 3 results)  Recent Labs  12/06/15 0226 12/10/15 1945  BNP 174.0* 178.0*    ProBNP (last 3 results) No results for input(s): PROBNP in the last 8760 hours.  Cardiac Panel (last 3 results) No results for input(s): CKTOTAL, CKMB, TROPONINI, RELINDX in the last 72 hours.  Iron/TIBC/Ferritin/ %Sat No results found for: IRON, TIBC, FERRITIN, IRONPCTSAT   EKG Orders placed or performed during the hospital encounter of 12/06/15  . EKG 12-Lead  . EKG 12-Lead  . EKG     Prior Assessment and Plan Problem List as of 01/13/2016 Reviewed: 12/12/2015 11:12 AM by Carlyle Dolly, MD     Cardiovascular and Mediastinum   CHF   Essential hypertension   Last Assessment & Plan 04/03/2015 Office Visit Written 04/03/2015  2:46 PM by Imogene Burn, PA-C     Blood pressure stable      Atrial fibrillation [facility])   Last Assessment & Plan 04/03/2015 Office Visit Edited 04/03/2015  2:59 PM by Imogene Burn, PA-C     Patient has chronic atrial fibrillation and has been on Coumadin until he ran out 2 weeks ago. He was followed here until 2012 and then was being followed at [facility]. He also ran out of his digoxin. His heart rate is 124 bpm but he was also some placed on inhalers yesterday for wheezing when he was diagnosed with the flu. He also ran out of his digoxin. We'll resume digoxin 0.25 mg daily. Increase diltiazem to 240 mg daily. Resume Coumadin 2.5 mg daily in be checked and the Coumadin clinic by Lattie Haw next Wednesday. I talked to him about starting one of the NOAC's but  He will just stay on  Coumadin since it's worked well for him over the years. Will set him up to see Dr. Harl Bowie in follow-up to become established.  This patients CHA2DS2-VASc Score and unadjusted Ischemic Stroke Rate (% per year) is equal to 4.8 % stroke rate/year from a score of 4  Above score calculated as 1 point each if present [CHF, HTN, DM, Vascular=MI/PAD/Aortic Plaque, Age if 65-74, or Male] Above score calculated as 2 points each if present [Age > 75, or Stroke/TIA/TE]        Nonischemic cardiomyopathy [facility])   Last Assessment & Plan 04/03/2015 Office Visit Written 04/03/2015  2:46 PM by Imogene Burn, PA-C     Patient has history of nonischemic cardiomyopathy EF 40-45% in 2011 on echo. Patient has not had any trouble with heart failure over the years.  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 He continues to drink about a pint every weekend. Recommend decrease alcohol intake.      Acute exacerbation of CHF (congestive heart failure) ([place])     Other  ALCOHOL ABUSE   Encounter for screening colonoscopy   Last Assessment & Plan 04/27/2012 Office Visit Written 04/27/2012  8:46 AM by Orvil Feil, NP    60 year old male presents with need for initial screening colonoscopy. He has noted scant hematochezia with straining, which is likely benign. He has no lower or upper GI symptoms. His blood pressure is significantly elevated today, and he tells me he has been without Toprol XL X 1 week. We are attempting to have him see his primary care doctor today for further management. He is asymptomatic currently. He is also on Coumadin, with a history of aflutter and a PE. He will need Lovenox bridging prior to procedure.  Proceed with colonoscopy with Dr. Oneida Alar in the near future. The risks, benefits, and alternatives have been discussed in detail with the patient. They state understanding and desire to proceed. This will be scheduled about 4 weeks out to ensure his BP issues have been addressed. Hold Coumadin X 3  days, lovenox bridging. We will call patient with specific instructions.        Current use of long term anticoagulation   Last Assessment & Plan 04/03/2015 Office Visit Written 04/03/2015  2:48 PM by Imogene Burn, PA-C     Patient has been on Coumadin for chronic atrial fibrillation since 2012. He also had DVT and pulmonary embolus in 2011. He ran out of his Coumadin 2 weeks ago because of couldn't be refilled. He was not being followed here since 2012. Discussed with Christella Noa and will start him at 2.5 mg daily since he is on antibiotics and steroids. He will be seen in our Coumadin clinic next Wednesday.      Encounter for therapeutic drug monitoring       Imaging: No results found.

## 2016-07-29 ENCOUNTER — Ambulatory Visit (INDEPENDENT_AMBULATORY_CARE_PROVIDER_SITE_OTHER): Payer: Medicaid Other | Admitting: Cardiology

## 2016-07-29 ENCOUNTER — Encounter: Payer: Self-pay | Admitting: Cardiology

## 2016-07-29 VITALS — BP 124/68 | HR 75 | Ht 71.5 in | Wt 289.0 lb

## 2016-07-29 DIAGNOSIS — I4891 Unspecified atrial fibrillation: Secondary | ICD-10-CM

## 2016-07-29 DIAGNOSIS — I10 Essential (primary) hypertension: Secondary | ICD-10-CM

## 2016-07-29 DIAGNOSIS — I340 Nonrheumatic mitral (valve) insufficiency: Secondary | ICD-10-CM | POA: Diagnosis not present

## 2016-07-29 DIAGNOSIS — I5042 Chronic combined systolic (congestive) and diastolic (congestive) heart failure: Secondary | ICD-10-CM

## 2016-07-29 NOTE — Patient Instructions (Signed)

## 2016-07-29 NOTE — Progress Notes (Signed)
Clinical Summary Dennis Swanson is a 61 y.o.male seen today for follow up of the following medical problems.   1. Afib  - denies any palpitations. No bleeding on anticoag.    2. Chronic systolic/diastolic HF - echo 6063 LVEF 40-45% - 04/2015 LVEF 01-60%, grade I diastolic dysfunction.   - occasional LE edema. Compliant with diuretics - not checking home weights.    3. Mitral regurgitation - mild to mod by echo 2011 - repeat echo 04/2015 MR is mild.  - no recent symptoms   4. HTN - he is compliant with meds  5. Hyperlipidemia - labs by pcp He is compliant with statin  6. OSA screen - no snoring at night, no apneic episodes, no daytime somnolence.  Past Medical History:  Diagnosis Date  . Acute pulmonary embolism ([place]) 2011  . Alcohol abuse   . Atrial flutter ([place])   . CHF (congestive heart failure) ([place])   . Hypertension   . Lower extremity cellulitis    bilateral  . Systolic dysfunction      No Known Allergies   Current Outpatient Prescriptions  Medication Sig Dispense Refill  . albuterol (PROVENTIL HFA;VENTOLIN HFA) 108 (90 Base) MCG/ACT inhaler Inhale 1-2 puffs into the lungs every 6 (six) hours as needed for wheezing or shortness of breath. 1 Inhaler 0  . atorvastatin (LIPITOR) 40 MG tablet Take 1 tablet (40 mg total) by mouth daily at 6 PM. 30 tablet 11  . diltiazem (CARDIZEM CD) 240 MG 24 hr capsule Take 1 capsule (240 mg total) by mouth daily. 30 capsule 11  . furosemide (LASIX) 40 MG tablet TAKE 1 TAB IN THE MORNING AND 1/2 TAB EVENING 135 tablet 3  . lisinopril (PRINIVIL,ZESTRIL) 40 MG tablet Take 1 tablet (40 mg total) by mouth daily. 30 tablet 11  . metFORMIN (GLUCOPHAGE) 500 MG tablet Take 1 tablet (500 mg total) by mouth 2 (two) times daily with a meal. 180 tablet 3  . metoprolol succinate (TOPROL-XL) 100 MG 24 hr tablet Take 1 tablet (100 mg total) by mouth daily. Take with or immediately following a meal. 90 tablet 3  . metoprolol  succinate (TOPROL-XL) 50 MG 24 hr tablet Take 1 tablet (50 mg total) by mouth daily. Take with or immediately following a meal. 90 tablet 3  . potassium chloride (KLOR-CON) 20 MEQ packet Take 20 mEq by mouth daily. 30 tablet 11  . ranitidine (ZANTAC) 150 MG capsule Take 150 mg by mouth 2 (two) times daily.    . rivaroxaban (XARELTO) 20 MG TABS tablet Take 1 tablet (20 mg total) by mouth daily with supper. 30 tablet 11   No current facility-administered medications for this visit.      Past Surgical History:  Procedure Laterality Date  . COLONOSCOPY N/A 05/17/2012   Procedure: COLONOSCOPY;  Surgeon: Danie Binder, MD;  Location: [facility];  Service: Endoscopy;  Laterality: N/A;  10:30  . None       No Known Allergies    Family History  Problem Relation Age of Onset  . Heart attack Mother   . Hypertension Brother   . Diabetes Brother   . Colon cancer Neg Hx      Social History Mr. Bensinger reports that he has never smoked. He has never used smokeless tobacco. Mr. Marinello reports that he drinks about 1.8 oz of alcohol per week .   Review of Systems CONSTITUTIONAL: No weight loss, fever, chills, weakness or fatigue.  HEENT: Eyes:  No visual loss, blurred vision, double vision or yellow sclerae.No hearing loss, sneezing, congestion, runny nose or sore throat.  SKIN: No rash or itching.  CARDIOVASCULAR: per hpi RESPIRATORY: No shortness of breath, cough or sputum.  GASTROINTESTINAL: No anorexia, nausea, vomiting or diarrhea. No abdominal pain or blood.  GENITOURINARY: No burning on urination, no polyuria NEUROLOGICAL: No headache, dizziness, syncope, paralysis, ataxia, numbness or tingling in the extremities. No change in bowel or bladder control.  MUSCULOSKELETAL: No muscle, back pain, joint pain or stiffness.  LYMPHATICS: No enlarged nodes. No history of splenectomy.  PSYCHIATRIC: No history of depression or anxiety.  ENDOCRINOLOGIC: No reports of sweating, cold or  heat intolerance. No polyuria or polydipsia.  Marland Kitchen   Physical Examination Vitals:   07/29/16 1117  BP: 124/68  Pulse: 75   Vitals:   07/29/16 1117  Weight: 289 lb (131.1 kg)  Height: 5' 11.5" (1.816 m)    Gen: resting comfortably, no acute distress HEENT: no scleral icterus, pupils equal round and reactive, no palptable cervical adenopathy,  CV: RRR, 2/6 systolic murmur LLSB no jvd Resp: Clear to auscultation bilaterally GI: abdomen is soft, non-tender, non-distended, normal bowel sounds, no hepatosplenomegaly MSK: extremities are warm, no edema.  Skin: warm, no rash Neuro:  no focal deficits Psych: appropriate affect   Diagnostic Studies Study Conclusions  - Left ventricle: The cavity size was normal. Wall thickness was  increased in a pattern of moderate LVH. Systolic function was  normal. The estimated ejection fraction was in the range of 60%  to 65%. Doppler parameters are consistent with abnormal left  ventricular relaxation (grade 1 diastolic dysfunction). - Aortic valve: Mildly calcified annulus. Trileaflet; mildly  thickened leaflets. Valve area (VTI): 2.5 cm^2. Valve area  (Vmax): 2.38 cm^2. - Mitral valve: Mildly calcified annulus. Mildly thickened leaflets  . There was mild regurgitation. - Technically adequate study.    Assessment and Plan   1. Afib - no symptoms - CHADS2Vasc score is 3, continue anticoag  2. Chronic systolic/diastolic HF - LVEF has normalized, continues to have some diastolic dysfunction.  - continue diuretics  3. Mitral regurgitation - mild by recent echo - continue to monitor  4. HTN At goal, continue current meds    F/u 6 months. Request labs from pcp     Arnoldo Lenis, M.D.

## 2016-11-02 ENCOUNTER — Other Ambulatory Visit: Payer: Self-pay | Admitting: Cardiology

## 2016-11-09 ENCOUNTER — Emergency Department (HOSPITAL_COMMUNITY): Payer: Medicaid Other

## 2016-11-09 ENCOUNTER — Encounter (HOSPITAL_COMMUNITY): Payer: Self-pay | Admitting: Emergency Medicine

## 2016-11-09 ENCOUNTER — Emergency Department (HOSPITAL_COMMUNITY)
Admission: EM | Admit: 2016-11-09 | Discharge: 2016-11-09 | Disposition: A | Discharge status: Home / Self Care | Payer: Medicaid Other | Attending: Emergency Medicine | Admitting: Emergency Medicine

## 2016-11-09 DIAGNOSIS — L03116 Cellulitis of left lower limb: Secondary | ICD-10-CM

## 2016-11-09 DIAGNOSIS — I110 Hypertensive heart disease with heart failure: Secondary | ICD-10-CM | POA: Diagnosis not present

## 2016-11-09 DIAGNOSIS — I5020 Unspecified systolic (congestive) heart failure: Secondary | ICD-10-CM | POA: Insufficient documentation

## 2016-11-09 DIAGNOSIS — R2242 Localized swelling, mass and lump, left lower limb: Secondary | ICD-10-CM | POA: Diagnosis present

## 2016-11-09 DIAGNOSIS — R0602 Shortness of breath: Secondary | ICD-10-CM | POA: Insufficient documentation

## 2016-11-09 DIAGNOSIS — R791 Abnormal coagulation profile: Secondary | ICD-10-CM | POA: Insufficient documentation

## 2016-11-09 DIAGNOSIS — M7989 Other specified soft tissue disorders: Secondary | ICD-10-CM

## 2016-11-09 DIAGNOSIS — R609 Edema, unspecified: Secondary | ICD-10-CM

## 2016-11-09 LAB — CBC WITH DIFFERENTIAL/PLATELET
BASOS ABS: 0 10*3/uL (ref 0.0–0.1)
BASOS PCT: 0 %
EOS ABS: 0.1 10*3/uL (ref 0.0–0.7)
EOS PCT: 1 %
HCT: 44.3 % (ref 39.0–52.0)
Hemoglobin: 14.4 g/dL (ref 13.0–17.0)
LYMPHS PCT: 24 %
Lymphs Abs: 1.3 10*3/uL (ref 0.7–4.0)
MCH: 31.4 pg (ref 26.0–34.0)
MCHC: 32.5 g/dL (ref 30.0–36.0)
MCV: 96.7 fL (ref 78.0–100.0)
MONO ABS: 0.8 10*3/uL (ref 0.1–1.0)
Monocytes Relative: 14 %
Neutro Abs: 3.4 10*3/uL (ref 1.7–7.7)
Neutrophils Relative %: 61 %
PLATELETS: 155 10*3/uL (ref 150–400)
RBC: 4.58 MIL/uL (ref 4.22–5.81)
RDW: 14 % (ref 11.5–15.5)
WBC: 5.6 10*3/uL (ref 4.0–10.5)

## 2016-11-09 LAB — COMPREHENSIVE METABOLIC PANEL
ALT: 14 U/L — ABNORMAL LOW (ref 17–63)
AST: 17 U/L (ref 15–41)
Albumin: 3.6 g/dL (ref 3.5–5.0)
Alkaline Phosphatase: 56 U/L (ref 38–126)
Anion gap: 8 (ref 5–15)
BUN: 21 mg/dL — ABNORMAL HIGH (ref 6–20)
CHLORIDE: 99 mmol/L — AB (ref 101–111)
CO2: 34 mmol/L — ABNORMAL HIGH (ref 22–32)
Calcium: 9 mg/dL (ref 8.9–10.3)
Creatinine, Ser: 1.46 mg/dL — ABNORMAL HIGH (ref 0.61–1.24)
GFR calc Af Amer: 58 mL/min — ABNORMAL LOW (ref >60)
GFR calc non Af Amer: 50 mL/min — ABNORMAL LOW (ref >60)
Glucose, Bld: 77 mg/dL (ref 65–99)
POTASSIUM: 4.4 mmol/L (ref 3.5–5.1)
Sodium: 141 mmol/L (ref 135–145)
Total Bilirubin: 1 mg/dL (ref 0.3–1.2)
Total Protein: 7 g/dL (ref 6.5–8.1)

## 2016-11-09 LAB — BRAIN NATRIURETIC PEPTIDE: B NATRIURETIC PEPTIDE 5: 244 pg/mL — AB (ref 0.0–100.0)

## 2016-11-09 LAB — TROPONIN I

## 2016-11-09 LAB — PROTIME-INR
INR: 1.62
Prothrombin Time: 19.1 seconds — ABNORMAL HIGH (ref 11.4–15.2)

## 2016-11-09 MED ORDER — CLINDAMYCIN HCL 150 MG PO CAPS
300.0000 mg | ORAL_CAPSULE | Freq: Once | ORAL | Status: AC
  Administered 2016-11-09: 300 mg via ORAL
  Filled 2016-11-09: qty 2

## 2016-11-09 MED ORDER — FUROSEMIDE 10 MG/ML IJ SOLN
40.0000 mg | Freq: Once | INTRAMUSCULAR | Status: AC
  Administered 2016-11-09: 40 mg via INTRAVENOUS
  Filled 2016-11-09: qty 4

## 2016-11-09 MED ORDER — CLINDAMYCIN HCL 300 MG PO CAPS: 300.0000 mg | ORAL_CAPSULE | Freq: Four times a day (QID) | ORAL | 0 refills | Status: DC

## 2016-11-09 NOTE — Discharge Instructions (Signed)
Please double your Lasix for the next 3 days to help with some of the edema in your legs. Please start taking her Xarelto again as you are higher risk for blood clots with your history and decreased mobility.  Please follow up with your cardiologist/primary doctor in 3-5 days to ensure swelling is improved.

## 2016-11-09 NOTE — ED Provider Notes (Signed)
Emergency Department Provider Note   I have reviewed the triage vital signs and the nursing notes.   HISTORY  Chief Complaint Leg Swelling   HPI Dennis Swanson is a 61 y.o. male with a history of pulmonary embolus, atrial flutter, CHF, hypertension and cellulitis of presents to the emergency department today secondary to left lower show any swelling. Patient states that he hit there a little over a week ago by a branch while he was in the woods cleaning up. He subsequently went to jail for a few days and was on his medication for approximately 4-5 days to include blood thinners. This is around the time that this swelling in his left lower extremity started. He has worsening swelling in his right lower shin as well but nothing compared to his left knee are usually equal. He has some intermittent shortness of breath but nothing consistent. No chest pain. No worsening orthopnea or paroxysmal nocturnal dyspnea. No significant dyspnea on exertion for the patient report. No fevers, nausea, vomiting, systemic symptoms, weight loss or other associated or modifying symptoms. States he has a history of having DVT in his leg but he doesn't know which one.   Past Medical History:  Diagnosis Date  . Acute pulmonary embolism ([place]) 2011  . Alcohol abuse   . Atrial flutter ([place])   . CHF (congestive heart failure) ([place])   . Hypertension   . Lower extremity cellulitis    bilateral  . Systolic dysfunction     Patient Active Problem List   Diagnosis Date Noted  . Acute exacerbation of CHF (congestive heart failure) ([place]) 12/06/2015  . Encounter for therapeutic drug monitoring 04/10/2015  . Atrial fibrillation ([place]) 04/03/2015  . Nonischemic cardiomyopathy ([place]) 04/03/2015  . Current use of long term anticoagulation 04/03/2015  . Encounter for screening colonoscopy 04/27/2012  . Essential hypertension 04/27/2012  . CHF 12/17/2009  . ALCOHOL ABUSE 09/27/2009    Past Surgical History:    Procedure Laterality Date  . COLONOSCOPY N/A 05/17/2012   Procedure: COLONOSCOPY;  Surgeon: Danie Binder, MD;  Location: [facility];  Service: Endoscopy;  Laterality: N/A;  10:30  . None      Current Outpatient Rx  . Order #: [id number] Class: Print  . Order #: [id number] Class: Normal  . Order #: [id number] Class: Normal  . Order #: [id number] Class: Normal  . Order #: [id number] Class: Normal  . Order #: [id number] Class: Normal  . Order #: [id number] Class: Normal  . Order #: [id number] Class: Normal  . Order #: [id number] Class: Historical Med  . Order #: [id number] Class: Normal  . Order #: [id number] Class: Print    Allergies Patient has no known allergies.  Family History  Problem Relation Age of Onset  . Heart attack Mother   . Hypertension Brother   . Diabetes Brother   . Colon cancer Neg Hx     Social History Social History  Substance Use Topics  . Smoking status: Never Smoker  . Smokeless tobacco: Never Used  . Alcohol use 1.8 oz/week    3 Cans of beer per week     Comment: once a week    Review of Systems  All other systems negative except as documented in the HPI. All pertinent positives and negatives as reviewed in the HPI. ____________________________________________   PHYSICAL EXAM:  VITAL SIGNS: ED Triage Vitals [11/09/16 1042]  Enc Vitals Group     BP (!) 145/92     Pulse Rate 87     Resp 20  Temp 98 F (36.7 C)     Temp Source Oral     SpO2 94 %     Weight 280 lb (127 kg)     Height 6' (1.829 m)     Head Circumference      Peak Flow      Pain Score 3     Pain Loc      Pain Edu?      Excl. in [place]?     Constitutional: Alert and oriented. Well appearing and in no acute distress. Eyes: Conjunctivae are normal. PERRL. EOMI. Head: Atraumatic. Nose: No congestion/rhinnorhea. Mouth/Throat: Mucous membranes are moist.  Oropharynx non-erythematous. Neck: No stridor.  No meningeal signs.   Cardiovascular: Normal rate, regular rhythm. Good peripheral  circulation. Grossly normal heart sounds.   Respiratory: Normal respiratory effort.  No retractions. Lungs CTAB. Gastrointestinal: Soft and nontender. No distention.  Musculoskeletal: No lower extremity tendernessbut does have significant pitting edema of his left greater than right. There is an area of ecchymosis with mild surrounding erythema but no induration on his left lateral leg. No gross deformities of extremities. Neurologic:  Normal speech and language. No gross focal neurologic deficits are appreciated.  Skin:  Skin is warm, dry and intact. No rash noted.   ____________________________________________   LABS (all labs ordered are listed, but only abnormal results are displayed)  Labs Reviewed  COMPREHENSIVE METABOLIC PANEL - Abnormal; Notable for the following:       Result Value   Chloride 99 (*)    CO2 34 (*)    BUN 21 (*)    Creatinine, Ser 1.46 (*)    ALT 14 (*)    GFR calc non Af Amer 50 (*)    GFR calc Af Amer 58 (*)    All other components within normal limits  BRAIN NATRIURETIC PEPTIDE - Abnormal; Notable for the following:    B Natriuretic Peptide 244.0 (*)    All other components within normal limits  PROTIME-INR - Abnormal; Notable for the following:    Prothrombin Time 19.1 (*)    All other components within normal limits  CBC WITH DIFFERENTIAL/PLATELET  TROPONIN I   ____________________________________________  EKG   EKG Interpretation  Date/Time:  Monday November 09 2016 13:42:04 EDT Ventricular Rate:  87 PR Interval:    QRS Duration: 94 QT Interval:  383 QTC Calculation: 464 R Axis:   -69 Text Interpretation:  Atrial fibrillation Left anterior fascicular block Confirmed by Merrily Pew [phone]) on 11/09/2016 1:45:11 PM       ____________________________________________  RADIOLOGY  Dg Chest 2 View  Result Date: 11/09/2016 CLINICAL DATA:  Patient with lower extremity edema. EXAM: CHEST  2 VIEW COMPARISON:  Chest radiograph 12/10/2015  FINDINGS: Stable enlarged cardiac and mediastinal contours. Elevation right hemidiaphragm. No consolidative pulmonary opacities. No pleural effusion or pneumothorax. Thoracic spine degenerative changes. IMPRESSION: No acute cardiopulmonary process. Electronically Signed   By: Lovey Newcomer M.D.   On: 11/09/2016 13:05   US Venous Img Lower Unilateral Left  Result Date: 11/09/2016 CLINICAL DATA:  61 year old male with left lower extremity swelling for 1 week. EXAM: LEFT LOWER EXTREMITY VENOUS DOPPLER ULTRASOUND TECHNIQUE: Gray-scale sonography with graded compression, as well as color Doppler and duplex ultrasound were performed to evaluate the lower extremity deep venous systems from the level of the common femoral vein and including the common femoral, femoral, profunda femoral, popliteal and calf veins including the posterior tibial, peroneal and gastrocnemius veins when visible. The superficial great saphenous vein  was also interrogated. Spectral Doppler was utilized to evaluate flow at rest and with distal augmentation maneuvers in the common femoral, femoral and popliteal veins. COMPARISON:  None. FINDINGS: Normal flow, compressibility, and augmentation within the distal common femoral, proximal profunda femoral, proximal greater saphenous, entire femoral, popliteal veins, and imaged calf veins. Subcutaneous edema noted. IMPRESSION: 1. No evidence of DVT within the left lower extremity. 2. Subcutaneous edema. Electronically Signed   By: Margarette Canada M.D.   On: 11/09/2016 13:08    ____________________________________________   PROCEDURES  Procedure(s) performed:   Procedures   ____________________________________________   INITIAL IMPRESSION / ASSESSMENT AND PLAN / ED COURSE  Pertinent labs & imaging results that were available during my care of the patient were reviewed by me and considered in my medical decision making (see chart for details).  eval for dvt vs chf exacerbation vs  cellulitis.   Mild fluid overload, will up his lasix at home.   Negative DVT, will treat for cellulitis with PCP follow up.   Had an episode of asymptomatic hypoxia (around 85%) but after a couple deep breaths it improved to 93% and stayed there.   Stable for dc w/ abx and increased diuresis for a couple days.  ____________________________________________  FINAL CLINICAL IMPRESSION(S) / ED DIAGNOSES  Final diagnoses:  Edema  Leg swelling  Peripheral edema  Subtherapeutic international normalized ratio (INR)  Cellulitis of left lower extremity     MEDICATIONS GIVEN DURING THIS VISIT:  Medications  furosemide (LASIX) injection 40 mg (40 mg Intravenous Given 11/09/16 1527)  clindamycin (CLEOCIN) capsule 300 mg (300 mg Oral Given 11/09/16 1527)     NEW OUTPATIENT MEDICATIONS STARTED DURING THIS VISIT:  New Prescriptions   CLINDAMYCIN (CLEOCIN) 300 MG CAPSULE    Take 1 capsule (300 mg total) by mouth 4 (four) times daily. X 7 days    Note:  This document was prepared using Dragon voice recognition software and may include unintentional dictation errors.   Merrily Pew, MD 11/09/16 [phone]

## 2016-11-09 NOTE — ED Triage Notes (Signed)
Patient c/o bilateral, lower extremity edema. Per patient x1 week. Patient states that swelling started after being hit in back of legs with tree. Per patient also has some bruising. Patient states swelling more notable in left leg. Patient states has had similar swelling in past once. Hx of CHF.  Per patient slight shortness of breath. Denies any chest pain.

## 2017-01-21 ENCOUNTER — Encounter: Payer: Self-pay | Admitting: Gastroenterology

## 2017-02-08 ENCOUNTER — Other Ambulatory Visit: Payer: Self-pay | Admitting: Adult Health

## 2017-02-24 ENCOUNTER — Ambulatory Visit: Payer: Medicaid Other | Admitting: Gastroenterology

## 2017-02-24 ENCOUNTER — Encounter: Payer: Self-pay | Admitting: Gastroenterology

## 2017-02-24 DIAGNOSIS — Z1211 Encounter for screening for malignant neoplasm of colon: Secondary | ICD-10-CM

## 2017-02-24 DIAGNOSIS — K625 Hemorrhage of anus and rectum: Secondary | ICD-10-CM | POA: Insufficient documentation

## 2017-02-24 NOTE — Assessment & Plan Note (Signed)
TSC UP TO DATE NO WARNING SIGNS/SYMPTOMS  REVIEWED TCS REPORT WITH PT FROM APR 2014. NEXT TCS APR 2024 CALL WITH QUESTIONS OR CONCERNS.

## 2017-02-24 NOTE — Assessment & Plan Note (Signed)
BMI > 40.   DISCUSSED NEED AND MEDICAL REASONS FOR WEIGHT LOSS.  DRINK WATER TO KEEP YOUR URINE LIGHT YELLOW. FOLLOW A HIGH FIBER DIET.  HANDOUT GIVEN. CONTINUE YOUR WEIGHT LOSS EFFORTS. A WEIGHT OF 270 LBS   WILL GET YOUR BODY MASS INDEX(BMI) UNDER 40.  WHILE I DO NOT WANT TO ALARM YOU, YOUR BODY MASS INDEX IS OVER 40 WHICH MEANS YOU ARE MORBIDLY OBESE. MORBID OBESITY SHORTENS YOUR LIFE EXPECTANCY 10 YEARS. OBESITY CAN ACTIVATE CANCER GENES. OBESITY IS ASSOCIATED WITH AN INCREASED RISK FOR CIRRHOSIS AND ALL CANCERS, INCLUDING ESOPHAGEAL AND COLON CANCER. PLEASE CALL WITH QUESTIONS OR CONCERNS.

## 2017-02-24 NOTE — Progress Notes (Signed)
Dennis Swanson, please nic TCS for April 2024.

## 2017-02-24 NOTE — Progress Notes (Signed)
cc'ed to pcp °

## 2017-02-24 NOTE — Patient Instructions (Addendum)
DRINK WATER TO KEEP YOUR URINE LIGHT YELLOW.  FOLLOW A HIGH FIBER DIET. SEE INFO BELOW.  CONTINUE YOUR WEIGHT LOSS EFFORTS. A WEIGHT OF 270 LBS   WILL GET YOUR BODY MASS INDEX(BMI) UNDER 40.  WHILE I DO NOT WANT TO ALARM YOU, YOUR BODY MASS INDEX IS OVER 40 WHICH MEANS YOU ARE MORBIDLY OBESE. MORBID OBESITY SHORTENS YOUR LIFE EXPECTANCY 10 YEARS. OBESITY CAN ACTIVATE CANCER GENES. OBESITY IS ASSOCIATED WITH AN INCREASED RISK FOR CIRRHOSIS AND ALL CANCERS, INCLUDING ESOPHAGEAL AND COLON CANCER.  NEXT COLONOSCOPY IN APR 2024.  PLEASE CALL WITH QUESTIONS OR CONCERNS.  High-Fiber Diet A high-fiber diet changes your normal diet to include more whole grains, legumes, fruits, and vegetables. Changes in the diet involve replacing refined carbohydrates with unrefined foods. The calorie level of the diet is essentially unchanged. The Dietary Reference Intake (recommended amount) for adult males is 38 grams per day. For adult females, it is 25 grams per day. Pregnant and lactating women should consume 28 grams of fiber per day. Fiber is the intact part of a plant that is not broken down during digestion. Functional fiber is fiber that has been isolated from the plant to provide a beneficial effect in the body. PURPOSE  Increase stool bulk.   Ease and regulate bowel movements.   Lower cholesterol.   REDUCE RISK OF COLON CANCER  INDICATIONS THAT YOU NEED MORE FIBER  Constipation and hemorrhoids.   Uncomplicated diverticulosis (intestine condition) and irritable bowel syndrome.   Weight management.   As a protective measure against hardening of the arteries (atherosclerosis), diabetes, and cancer.   GUIDELINES FOR INCREASING FIBER IN THE DIET  Start adding fiber to the diet slowly. A gradual increase of about 5 more grams (2 slices of whole-wheat bread, 2 servings of most fruits or vegetables, or 1 bowl of high-fiber cereal) per day is best. Too rapid an increase in fiber may result in  constipation, flatulence, and bloating.   Drink enough water and fluids to keep your urine clear or pale yellow. Water, juice, or caffeine-free drinks are recommended. Not drinking enough fluid may cause constipation.   Eat a variety of high-fiber foods rather than one type of fiber.   Try to increase your intake of fiber through using high-fiber foods rather than fiber pills or supplements that contain small amounts of fiber.   The goal is to change the types of food eaten. Do not supplement your present diet with high-fiber foods, but replace foods in your present diet.  INCLUDE A VARIETY OF FIBER SOURCES  Replace refined and processed grains with whole grains, canned fruits with fresh fruits, and incorporate other fiber sources. White rice, white breads, and most bakery goods contain little or no fiber.   Brown whole-grain rice, buckwheat oats, and many fruits and vegetables are all good sources of fiber. These include: broccoli, Brussels sprouts, cabbage, cauliflower, beets, sweet potatoes, white potatoes (skin on), carrots, tomatoes, eggplant, squash, berries, fresh fruits, and dried fruits.   Cereals appear to be the richest source of fiber. Cereal fiber is found in whole grains and bran. Bran is the fiber-rich outer coat of cereal grain, which is largely removed in refining. In whole-grain cereals, the bran remains. In breakfast cereals, the largest amount of fiber is found in those with "bran" in their names. The fiber content is sometimes indicated on the label.   You may need to include additional fruits and vegetables each day.   In baking, for 1 cup white  flour, you may use the following substitutions:   1 cup whole-wheat flour minus 2 tablespoons.   1/2 cup white flour plus 1/2 cup whole-wheat flour.

## 2017-02-24 NOTE — Assessment & Plan Note (Addendum)
LOW VOLUME INTERMITTENT ASSOCIATED WITH STRAINING. WAS ON COUMADIN AND OW ON XARELTO. TCS UP TO DATE AS OF APR 2014. SYMPTOMS FAIRLY WELL CONTROLLED.  DRINK WATER TO KEEP YOUR URINE LIGHT YELLOW. FOLLOW A HIGH FIBER DIET.  CONTINUE YOUR WEIGHT LOSS EFFORTS. A WEIGHT OF 270 LBS   WILL GET YOUR BODY MASS INDEX(BMI) UNDER 40.  WHILE I DO NOT WANT TO ALARM YOU, YOUR BODY MASS INDEX IS OVER 40 WHICH MEANS YOU ARE MORBIDLY OBESE. MORBID OBESITY SHORTENS YOUR LIFE EXPECTANCY 10 YEARS. OBESITY CAN ACTIVATE CANCER GENES. OBESITY IS ASSOCIATED WITH AN INCREASED RISK FOR CIRRHOSIS AND ALL CANCERS, INCLUDING ESOPHAGEAL AND COLON CANCER. NEXT COLONOSCOPY IN APR 2024. USE PREPARATION H FOUR TIMES  A DAY IF NEEDED TO RELIEVE RECTAL PAIN/PRESSURE/BLEEDING. PLEASE CALL WITH QUESTIONS OR CONCERNS.

## 2017-02-24 NOTE — Progress Notes (Signed)
Subjective:    Patient ID: Dennis Swanson, male    DOB: 04-24-1955, 62 y.o.   MRN: [id number]  Rosita Fire, MD   HPI May see rectal bleeding: <1-2x/mo if he has to strain. LAST 2014: COLONOSCOPY IH/BENIGNRECTALPOLYP. ON A 10 YR RECALL. MAY GET SO IF HE BENDS DOWN TO TIE HIS SHOE.  PT DENIES FEVER, CHILLS, HEMATEMESIS, nausea, vomiting, melena, diarrhea, CHEST PAIN, CHANGE IN BOWEL IN HABITS, constipation, abdominal pain, problems swallowing, problems with sedation, OR heartburn or indigestion.  Past Medical History:  Diagnosis Date  . Acute pulmonary embolism ([place]) 2011  . Alcohol abuse   . Atrial flutter ([place])   . CHF (congestive heart failure) ([place])   . Hypertension   . Lower extremity cellulitis    bilateral  . Systolic dysfunction    Past Surgical History:  Procedure Laterality Date  . COLONOSCOPY N/A 05/17/2012            No Known Allergies  Current Outpatient Medications  Medication Sig Dispense Refill  . albuterol (PROVENTIL HFA;VENTOLIN HFA) 108 (90 Base) MCG/ACT inhaler Inhale 1-2 puffs into the lungs every 6 (six) hours as needed for wheezing or shortness of breath. 1 Inhaler 0  . atorvastatin (LIPITOR) 40 MG tablet Take 1 tablet (40 mg total) by mouth daily at 6 PM. 30 tablet 11  . CARTIA XT 240 MG 24 hr capsule TAKE ONE CAPSULE BY MOUTH ONCE DAILY 90 capsule 0  . clindamycin (CLEOCIN) 300 MG capsule Take 1 capsule (300 mg total) by mouth 4 (four) times daily. X 7 days 28 capsule 0  . furosemide (LASIX) 40 MG tablet TAKE ONE TABLET BY MOUTH IN THE MORNING AND ONE-HALF IN THE EVENING 135 tablet 3  . lisinopril (PRINIVIL,ZESTRIL) 40 MG tablet Take 1 tablet (40 mg total) by mouth daily. 30 tablet 11  . metFORMIN (GLUCOPHAGE) 500 MG tablet Take 1 tablet (500 mg total) by mouth 2 (two) times daily with a meal. 180 tablet 3  . metoprolol succinate (TOPROL-XL) 50 MG 24 hr tablet TAKE ONE TABLET BY MOUTH ONCE DAILY WITH  OR  IMMEDIATELY  FOLLOWING  A  MEAL  (TAKE   WITH  100MG   DOSE  FOR  TOTAL  OF  150MG   DAILY) 180 tablet 0  . potassium chloride (KLOR-CON) 20 MEQ packet Take 20 mEq by mouth daily. 30 tablet 11  . ranitidine (ZANTAC) 150 MG capsule Take 150 mg by mouth 2 (two) times daily.    . rivaroxaban (XARELTO) 20 MG TABS tablet Take 1 tablet (20 mg total) by mouth daily with supper. 30 tablet 11   Review of Systems PER HPI OTHERWISE ALL SYSTEMS ARE NEGATIVE.    Objective:   Physical Exam  Constitutional: He is oriented to person, place, and time. He appears well-developed and well-nourished. No distress.  HENT:  Head: Normocephalic and atraumatic.  Mouth/Throat: Oropharynx is clear and moist. No oropharyngeal exudate.  Eyes: Pupils are equal, round, and reactive to light. No scleral icterus.  Neck: Normal range of motion. Neck supple.  Cardiovascular: Normal rate, regular rhythm and normal heart sounds.  Pulmonary/Chest: Effort normal and breath sounds normal. No respiratory distress.  Abdominal: Soft. Bowel sounds are normal. He exhibits no distension. There is no tenderness.  Musculoskeletal: He exhibits edema (TRACE BIL LOWER EXTREMTIES).  Lymphadenopathy:    He has no cervical adenopathy.  Neurological: He is alert and oriented to person, place, and time.  NO FOCAL DEFICITS  Psychiatric: He has a normal  mood and affect.  Vitals reviewed.     Assessment & Plan:

## 2017-02-25 ENCOUNTER — Ambulatory Visit: Payer: Medicaid Other | Admitting: Cardiology

## 2017-02-25 NOTE — Progress Notes (Deleted)
Clinical Summary Mr. Vandevoorde is a 62 y.o.male seen today for follow up of the following medical problems.   1. Afib  - denies any palpitations. No bleeding on anticoag.    2. Chronic systolic/diastolic HF - echo 4098 LVEF 40-45% - 04/2015 LVEF 11-91%, grade I diastolic dysfunction.   - occasional LE edema. Compliant with diuretics - not checking home weights.    3. Mitral regurgitation - mild to mod by echo 2011 - repeat echo 04/2015 MR is mild.  - no recent symptoms   4. HTN - he is compliant with meds  5. Hyperlipidemia - labs by pcp He is compliant with statin  6. OSA screen - no snoring at night, no apneic episodes, no daytime somnolence.    Past Medical History:  Diagnosis Date  . Acute pulmonary embolism ([place]) 2011  . Alcohol abuse   . Atrial flutter ([place])   . CHF (congestive heart failure) ([place])   . Hypertension   . Lower extremity cellulitis    bilateral  . Systolic dysfunction      No Known Allergies   Current Outpatient Medications  Medication Sig Dispense Refill  . albuterol (PROVENTIL HFA;VENTOLIN HFA) 108 (90 Base) MCG/ACT inhaler Inhale 1-2 puffs into the lungs every 6 (six) hours as needed for wheezing or shortness of breath. 1 Inhaler 0  . atorvastatin (LIPITOR) 40 MG tablet Take 1 tablet (40 mg total) by mouth daily at 6 PM. 30 tablet 11  . CARTIA XT 240 MG 24 hr capsule TAKE ONE CAPSULE BY MOUTH ONCE DAILY 90 capsule 0  . clindamycin (CLEOCIN) 300 MG capsule Take 1 capsule (300 mg total) by mouth 4 (four) times daily. X 7 days 28 capsule 0  . furosemide (LASIX) 40 MG tablet TAKE ONE TABLET BY MOUTH IN THE MORNING AND ONE-HALF IN THE EVENING 135 tablet 3  . lisinopril (PRINIVIL,ZESTRIL) 40 MG tablet Take 1 tablet (40 mg total) by mouth daily. 30 tablet 11  . metFORMIN (GLUCOPHAGE) 500 MG tablet Take 1 tablet (500 mg total) by mouth 2 (two) times daily with a meal. 180 tablet 3  . metoprolol succinate (TOPROL-XL) 50 MG 24  hr tablet TAKE ONE TABLET BY MOUTH ONCE DAILY WITH  OR  IMMEDIATELY  FOLLOWING  A  MEAL  (TAKE  WITH  100MG   DOSE  FOR  TOTAL  OF  150MG   DAILY) 180 tablet 0  . potassium chloride (KLOR-CON) 20 MEQ packet Take 20 mEq by mouth daily. 30 tablet 11  . ranitidine (ZANTAC) 150 MG capsule Take 150 mg by mouth 2 (two) times daily.    . rivaroxaban (XARELTO) 20 MG TABS tablet Take 1 tablet (20 mg total) by mouth daily with supper. 30 tablet 11   No current facility-administered medications for this visit.      Past Surgical History:  Procedure Laterality Date  . COLONOSCOPY N/A 05/17/2012   Procedure: COLONOSCOPY;  Surgeon: Danie Binder, MD;  Location: [facility];  Service: Endoscopy;  Laterality: N/A;  10:30  . None       No Known Allergies    Family History  Problem Relation Age of Onset  . Heart attack Mother   . Hypertension Brother   . Diabetes Brother   . Colon cancer Neg Hx      Social History Mr. Ewan reports that  has never smoked. he has never used smokeless tobacco. Mr. Davisson reports that he drinks about 1.8 oz of alcohol per week.  Review of Systems CONSTITUTIONAL: No weight loss, fever, chills, weakness or fatigue.  HEENT: Eyes: No visual loss, blurred vision, double vision or yellow sclerae.No hearing loss, sneezing, congestion, runny nose or sore throat.  SKIN: No rash or itching.  CARDIOVASCULAR:  RESPIRATORY: No shortness of breath, cough or sputum.  GASTROINTESTINAL: No anorexia, nausea, vomiting or diarrhea. No abdominal pain or blood.  GENITOURINARY: No burning on urination, no polyuria NEUROLOGICAL: No headache, dizziness, syncope, paralysis, ataxia, numbness or tingling in the extremities. No change in bowel or bladder control.  MUSCULOSKELETAL: No muscle, back pain, joint pain or stiffness.  LYMPHATICS: No enlarged nodes. No history of splenectomy.  PSYCHIATRIC: No history of depression or anxiety.  ENDOCRINOLOGIC: No reports of sweating,  cold or heat intolerance. No polyuria or polydipsia.  Marland Kitchen   Physical Examination There were no vitals filed for this visit. There were no vitals filed for this visit.  Gen: resting comfortably, no acute distress HEENT: no scleral icterus, pupils equal round and reactive, no palptable cervical adenopathy,  CV Resp: Clear to auscultation bilaterally GI: abdomen is soft, non-tender, non-distended, normal bowel sounds, no hepatosplenomegaly MSK: extremities are warm, no edema.  Skin: warm, no rash Neuro:  no focal deficits Psych: appropriate affect   Diagnostic Studies 04/2015 echo Study Conclusions  - Left ventricle: The cavity size was normal. Wall thickness was  increased in a pattern of moderate LVH. Systolic function was  normal. The estimated ejection fraction was in the range of 60%  to 65%. Doppler parameters are consistent with abnormal left  ventricular relaxation (grade 1 diastolic dysfunction). - Aortic valve: Mildly calcified annulus. Trileaflet; mildly  thickened leaflets. Valve area (VTI): 2.5 cm^2. Valve area  (Vmax): 2.38 cm^2. - Mitral valve: Mildly calcified annulus. Mildly thickened leaflets  . There was mild regurgitation. - Technically adequate study.   Assessment and Plan  1. Afib - no symptoms - CHADS2Vasc score is 3, continue anticoag  2. Chronic systolic/diastolicHF - LVEF has normalized, continues to have some diastolic dysfunction.  - continue diuretics  3. Mitral regurgitation - mild by recent echo - continue to monitor  4. HTN At goal, continue current meds      Arnoldo Lenis, M.D.

## 2017-04-08 ENCOUNTER — Ambulatory Visit (INDEPENDENT_AMBULATORY_CARE_PROVIDER_SITE_OTHER): Payer: Medicaid Other | Admitting: Cardiology

## 2017-04-08 ENCOUNTER — Encounter: Payer: Self-pay | Admitting: Cardiology

## 2017-04-08 VITALS — BP 132/76 | HR 71 | Ht 71.0 in | Wt 294.0 lb

## 2017-04-08 DIAGNOSIS — I5042 Chronic combined systolic (congestive) and diastolic (congestive) heart failure: Secondary | ICD-10-CM | POA: Diagnosis not present

## 2017-04-08 DIAGNOSIS — I4891 Unspecified atrial fibrillation: Secondary | ICD-10-CM

## 2017-04-08 DIAGNOSIS — I10 Essential (primary) hypertension: Secondary | ICD-10-CM | POA: Diagnosis not present

## 2017-04-08 DIAGNOSIS — I340 Nonrheumatic mitral (valve) insufficiency: Secondary | ICD-10-CM

## 2017-04-08 NOTE — Progress Notes (Signed)
Clinical Summary Dennis Swanson is a 62 y.o.male seen today for follow up of the following medical problems.   1. Afib - no recent palpitaitons. No bleeding on xarelto.   2. Chronic systolic/diastolic HF - echo 3536 LVEF 40-45% - 04/2015 LVEF 14-43%, grade I diastolic dysfunction.   - occasional SOB at times. Often with nasal congestion - no recent edema - compliant with meds.    3. Mitral regurgitation - mild to mod by echo 2011 - repeat echo 04/2015 MR is mild.   - denies any recent symptoms   4. HTN - compliant with meds  5. Hyperlipidemia -compliant with meds, labs followed by pcp   6. OSA screen - no snoring at night, no apneic episodes, no daytime somnolence   Past Medical History:  Diagnosis Date  . Acute pulmonary embolism ([place]) 2011  . Alcohol abuse   . Atrial flutter ([place])   . CHF (congestive heart failure) ([place])   . Hypertension   . Lower extremity cellulitis    bilateral  . Systolic dysfunction      No Known Allergies   Current Outpatient Medications  Medication Sig Dispense Refill  . albuterol (PROVENTIL HFA;VENTOLIN HFA) 108 (90 Base) MCG/ACT inhaler Inhale 1-2 puffs into the lungs every 6 (six) hours as needed for wheezing or shortness of breath. 1 Inhaler 0  . atorvastatin (LIPITOR) 40 MG tablet Take 1 tablet (40 mg total) by mouth daily at 6 PM. 30 tablet 11  . CARTIA XT 240 MG 24 hr capsule TAKE ONE CAPSULE BY MOUTH ONCE DAILY 90 capsule 0  . clindamycin (CLEOCIN) 300 MG capsule Take 1 capsule (300 mg total) by mouth 4 (four) times daily. X 7 days 28 capsule 0  . furosemide (LASIX) 40 MG tablet TAKE ONE TABLET BY MOUTH IN THE MORNING AND ONE-HALF IN THE EVENING 135 tablet 3  . lisinopril (PRINIVIL,ZESTRIL) 40 MG tablet Take 1 tablet (40 mg total) by mouth daily. 30 tablet 11  . metFORMIN (GLUCOPHAGE) 500 MG tablet Take 1 tablet (500 mg total) by mouth 2 (two) times daily with a meal. 180 tablet 3  . metoprolol succinate  (TOPROL-XL) 50 MG 24 hr tablet TAKE ONE TABLET BY MOUTH ONCE DAILY WITH  OR  IMMEDIATELY  FOLLOWING  A  MEAL  (TAKE  WITH  100MG   DOSE  FOR  TOTAL  OF  150MG   DAILY) 180 tablet 0  . potassium chloride (KLOR-CON) 20 MEQ packet Take 20 mEq by mouth daily. 30 tablet 11  . ranitidine (ZANTAC) 150 MG capsule Take 150 mg by mouth 2 (two) times daily.    . rivaroxaban (XARELTO) 20 MG TABS tablet Take 1 tablet (20 mg total) by mouth daily with supper. 30 tablet 11   No current facility-administered medications for this visit.      Past Surgical History:  Procedure Laterality Date  . COLONOSCOPY N/A 05/17/2012   Procedure: COLONOSCOPY;  Surgeon: Danie Binder, MD;  Location: [facility];  Service: Endoscopy;  Laterality: N/A;  10:30  . None       No Known Allergies    Family History  Problem Relation Age of Onset  . Heart attack Mother   . Hypertension Brother   . Diabetes Brother   . Colon cancer Neg Hx      Social History Mr. Nelis reports that  has never smoked. he has never used smokeless tobacco. Mr. Sutcliffe reports that he drinks about 1.8 oz of alcohol per  week.   Review of Systems CONSTITUTIONAL: No weight loss, fever, chills, weakness or fatigue.  HEENT: Eyes: No visual loss, blurred vision, double vision or yellow sclerae.No hearing loss, sneezing, congestion, runny nose or sore throat.  SKIN: No rash or itching.  CARDIOVASCULAR: per hpi RESPIRATORY: No shortness of breath, cough or sputum.  GASTROINTESTINAL: No anorexia, nausea, vomiting or diarrhea. No abdominal pain or blood.  GENITOURINARY: No burning on urination, no polyuria NEUROLOGICAL: No headache, dizziness, syncope, paralysis, ataxia, numbness or tingling in the extremities. No change in bowel or bladder control.  MUSCULOSKELETAL: No muscle, back pain, joint pain or stiffness.  LYMPHATICS: No enlarged nodes. No history of splenectomy.  PSYCHIATRIC: No history of depression or anxiety.    ENDOCRINOLOGIC: No reports of sweating, cold or heat intolerance. No polyuria or polydipsia.  Marland Kitchen   Physical Examination Vitals:   04/08/17 1027  BP: 132/76  Pulse: 71  SpO2: 93%   Vitals:   04/08/17 1027  Weight: 294 lb (133.4 kg)  Height: 5\' 11"  (1.803 m)    Gen: resting comfortably, no acute distress HEENT: no scleral icterus, pupils equal round and reactive, no palptable cervical adenopathy,  CV: RRR, no m/r/g, no jvd Resp: Clear to auscultation bilaterally GI: abdomen is soft, non-tender, non-distended, normal bowel sounds, no hepatosplenomegaly MSK: extremities are warm, no edema.  Skin: warm, no rash Neuro:  no focal deficits Psych: appropriate affect   Diagnostic Studies Study Conclusions  - Left ventricle: The cavity size was normal. Wall thickness was  increased in a pattern of moderate LVH. Systolic function was  normal. The estimated ejection fraction was in the range of 60%  to 65%. Doppler parameters are consistent with abnormal left  ventricular relaxation (grade 1 diastolic dysfunction). - Aortic valve: Mildly calcified annulus. Trileaflet; mildly  thickened leaflets. Valve area (VTI): 2.5 cm^2. Valve area  (Vmax): 2.38 cm^2. - Mitral valve: Mildly calcified annulus. Mildly thickened leaflets  . There was mild regurgitation. - Technically adequate study.    Assessment and Plan  1. Afib - CHADS2Vasc score is 3, continue anticoag - no symptoms, continue current meds  2. Chronic systolic/diastolicHF - LVEF has normalized, continues to have some diastolic dysfunction.  - no recent symptoms, continue current meds  3. Mitral regurgitation - mild by recent echo - we will continue to monitor.   4. HTN - bp at goal, continue current meds   F/u 1 year    Arnoldo Lenis, M.D

## 2017-04-08 NOTE — Patient Instructions (Addendum)
Your physician wants you to follow-up in:1 year  with Dr Bryna Colander will receive a reminder letter in the mail two months in advance. If you don't receive a letter, please call our office to schedule the follow-up appointment.      Your physician recommends that you continue on your current medications as directed. Please refer to the Current Medication list given to you today.     If you need a refill on your cardiac medications before your next appointment, please call your pharmacy.     No testing ordered today       Thank you for choosing [facility] !

## 2017-04-13 ENCOUNTER — Encounter: Payer: Self-pay | Admitting: Cardiology

## 2017-04-16 ENCOUNTER — Other Ambulatory Visit: Payer: Self-pay | Admitting: Cardiology

## 2017-05-06 ENCOUNTER — Other Ambulatory Visit: Payer: Self-pay | Admitting: Adult Health

## 2017-09-27 ENCOUNTER — Other Ambulatory Visit: Payer: Self-pay | Admitting: *Deleted

## 2017-09-27 MED ORDER — METOPROLOL SUCCINATE ER 50 MG PO TB24: ORAL_TABLET | ORAL | 3 refills | Status: DC

## 2017-10-04 ENCOUNTER — Other Ambulatory Visit: Payer: Self-pay

## 2017-10-04 MED ORDER — METOPROLOL SUCCINATE ER 50 MG PO TB24: ORAL_TABLET | ORAL | 3 refills | Status: DC

## 2017-10-04 NOTE — Telephone Encounter (Signed)
Refilled toprol xl 50 mg per fax request

## 2018-05-10 ENCOUNTER — Emergency Department (HOSPITAL_COMMUNITY)
Admission: EM | Admit: 2018-05-10 | Discharge: 2018-05-11 | Disposition: A | Discharge status: Home / Self Care | Payer: Medicaid Other | Attending: Emergency Medicine | Admitting: Emergency Medicine

## 2018-05-10 ENCOUNTER — Encounter (HOSPITAL_COMMUNITY): Payer: Self-pay | Admitting: Emergency Medicine

## 2018-05-10 ENCOUNTER — Other Ambulatory Visit: Payer: Self-pay

## 2018-05-10 DIAGNOSIS — Z7901 Long term (current) use of anticoagulants: Secondary | ICD-10-CM | POA: Diagnosis not present

## 2018-05-10 DIAGNOSIS — Z041 Encounter for examination and observation following transport accident: Secondary | ICD-10-CM | POA: Diagnosis present

## 2018-05-10 DIAGNOSIS — Z7984 Long term (current) use of oral hypoglycemic drugs: Secondary | ICD-10-CM | POA: Diagnosis not present

## 2018-05-10 DIAGNOSIS — F10120 Alcohol abuse with intoxication, uncomplicated: Secondary | ICD-10-CM | POA: Insufficient documentation

## 2018-05-10 DIAGNOSIS — I509 Heart failure, unspecified: Secondary | ICD-10-CM | POA: Insufficient documentation

## 2018-05-10 DIAGNOSIS — I110 Hypertensive heart disease with heart failure: Secondary | ICD-10-CM | POA: Diagnosis not present

## 2018-05-10 DIAGNOSIS — Z79899 Other long term (current) drug therapy: Secondary | ICD-10-CM | POA: Diagnosis not present

## 2018-05-10 DIAGNOSIS — F10920 Alcohol use, unspecified with intoxication, uncomplicated: Secondary | ICD-10-CM

## 2018-05-10 LAB — CBG MONITORING, ED: Glucose-Capillary: 105 mg/dL — ABNORMAL HIGH (ref 70–99)

## 2018-05-10 NOTE — Discharge Instructions (Addendum)
Your vital signs are within normal limits.  Your blood sugar is 106.  Please refrain from using alcohol and driving.  Please see your primary physician or return to the emergency department if any changes in your condition, problems, or concerns.  Ice packs to the injured or sore muscles for the next several days then start using heat.  Return to the ED for any problems listed on the head injury sheet.
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
## 2018-05-11 LAB — CBG MONITORING, ED: Glucose-Capillary: 81 mg/dL (ref 70–99)

## 2018-05-11 NOTE — ED Notes (Signed)
Ambulated pt. Walked up and down hallway, Pt. Didn't need assistance, didn't stumble. Stated he was fine and felt good.

## 2018-07-05 ENCOUNTER — Other Ambulatory Visit: Payer: Self-pay | Admitting: Cardiology

## 2018-11-15 ENCOUNTER — Other Ambulatory Visit: Payer: Self-pay | Admitting: Cardiology

## 2019-03-03 ENCOUNTER — Other Ambulatory Visit: Payer: Self-pay | Admitting: Cardiology

## 2020-03-19 ENCOUNTER — Other Ambulatory Visit: Payer: Self-pay | Admitting: Cardiology

## 2020-03-20 NOTE — Telephone Encounter (Signed)
Pt has not been seen by Dr Harl Bowie since 04/08/17.  Called pt to make appt but phone rings then goes busy.  Will send letter in mail for him to call office for appointment with Dr Harl Bowie for OV and to get Xarelto refills.

## 2020-09-13 ENCOUNTER — Other Ambulatory Visit: Payer: Self-pay

## 2020-09-13 ENCOUNTER — Encounter (HOSPITAL_COMMUNITY): Payer: Self-pay | Admitting: *Deleted

## 2020-09-13 ENCOUNTER — Emergency Department (HOSPITAL_COMMUNITY): Payer: Medicaid Other

## 2020-09-13 ENCOUNTER — Inpatient Hospital Stay (HOSPITAL_COMMUNITY)
Admission: EM | Admit: 2020-09-13 | Discharge: 2020-09-16 | DRG: 378 | Disposition: A | Discharge status: Home / Self Care | Payer: Medicaid Other | Attending: Internal Medicine | Admitting: Internal Medicine

## 2020-09-13 DIAGNOSIS — Z86711 Personal history of pulmonary embolism: Secondary | ICD-10-CM

## 2020-09-13 DIAGNOSIS — E669 Obesity, unspecified: Secondary | ICD-10-CM | POA: Diagnosis present

## 2020-09-13 DIAGNOSIS — Z8249 Family history of ischemic heart disease and other diseases of the circulatory system: Secondary | ICD-10-CM

## 2020-09-13 DIAGNOSIS — K5730 Diverticulosis of large intestine without perforation or abscess without bleeding: Secondary | ICD-10-CM | POA: Diagnosis present

## 2020-09-13 DIAGNOSIS — K4090 Unilateral inguinal hernia, without obstruction or gangrene, not specified as recurrent: Secondary | ICD-10-CM | POA: Diagnosis present

## 2020-09-13 DIAGNOSIS — N183 Chronic kidney disease, stage 3 unspecified: Secondary | ICD-10-CM

## 2020-09-13 DIAGNOSIS — I4821 Permanent atrial fibrillation: Secondary | ICD-10-CM | POA: Diagnosis not present

## 2020-09-13 DIAGNOSIS — Z79899 Other long term (current) drug therapy: Secondary | ICD-10-CM

## 2020-09-13 DIAGNOSIS — R600 Localized edema: Secondary | ICD-10-CM

## 2020-09-13 DIAGNOSIS — I5033 Acute on chronic diastolic (congestive) heart failure: Secondary | ICD-10-CM | POA: Diagnosis present

## 2020-09-13 DIAGNOSIS — Z8719 Personal history of other diseases of the digestive system: Secondary | ICD-10-CM

## 2020-09-13 DIAGNOSIS — I482 Chronic atrial fibrillation, unspecified: Secondary | ICD-10-CM | POA: Diagnosis present

## 2020-09-13 DIAGNOSIS — D509 Iron deficiency anemia, unspecified: Secondary | ICD-10-CM | POA: Diagnosis present

## 2020-09-13 DIAGNOSIS — I480 Paroxysmal atrial fibrillation: Secondary | ICD-10-CM | POA: Diagnosis present

## 2020-09-13 DIAGNOSIS — Z833 Family history of diabetes mellitus: Secondary | ICD-10-CM

## 2020-09-13 DIAGNOSIS — I10 Essential (primary) hypertension: Secondary | ICD-10-CM | POA: Diagnosis present

## 2020-09-13 DIAGNOSIS — K449 Diaphragmatic hernia without obstruction or gangrene: Secondary | ICD-10-CM | POA: Diagnosis present

## 2020-09-13 DIAGNOSIS — K921 Melena: Principal | ICD-10-CM | POA: Diagnosis present

## 2020-09-13 DIAGNOSIS — I5032 Chronic diastolic (congestive) heart failure: Secondary | ICD-10-CM | POA: Diagnosis not present

## 2020-09-13 DIAGNOSIS — R609 Edema, unspecified: Secondary | ICD-10-CM

## 2020-09-13 DIAGNOSIS — K5900 Constipation, unspecified: Secondary | ICD-10-CM | POA: Diagnosis present

## 2020-09-13 DIAGNOSIS — N1831 Chronic kidney disease, stage 3a: Secondary | ICD-10-CM | POA: Diagnosis present

## 2020-09-13 DIAGNOSIS — Z7901 Long term (current) use of anticoagulants: Secondary | ICD-10-CM

## 2020-09-13 DIAGNOSIS — I4891 Unspecified atrial fibrillation: Secondary | ICD-10-CM | POA: Diagnosis present

## 2020-09-13 DIAGNOSIS — E1122 Type 2 diabetes mellitus with diabetic chronic kidney disease: Secondary | ICD-10-CM | POA: Diagnosis present

## 2020-09-13 DIAGNOSIS — E785 Hyperlipidemia, unspecified: Secondary | ICD-10-CM | POA: Diagnosis present

## 2020-09-13 DIAGNOSIS — K6289 Other specified diseases of anus and rectum: Secondary | ICD-10-CM

## 2020-09-13 DIAGNOSIS — K648 Other hemorrhoids: Secondary | ICD-10-CM | POA: Diagnosis present

## 2020-09-13 DIAGNOSIS — I472 Ventricular tachycardia: Secondary | ICD-10-CM | POA: Diagnosis not present

## 2020-09-13 DIAGNOSIS — I130 Hypertensive heart and chronic kidney disease with heart failure and stage 1 through stage 4 chronic kidney disease, or unspecified chronic kidney disease: Secondary | ICD-10-CM | POA: Diagnosis present

## 2020-09-13 DIAGNOSIS — D649 Anemia, unspecified: Secondary | ICD-10-CM | POA: Diagnosis present

## 2020-09-13 DIAGNOSIS — R001 Bradycardia, unspecified: Secondary | ICD-10-CM | POA: Diagnosis not present

## 2020-09-13 DIAGNOSIS — Z20822 Contact with and (suspected) exposure to covid-19: Secondary | ICD-10-CM | POA: Diagnosis present

## 2020-09-13 DIAGNOSIS — I5042 Chronic combined systolic (congestive) and diastolic (congestive) heart failure: Secondary | ICD-10-CM | POA: Diagnosis present

## 2020-09-13 DIAGNOSIS — K2970 Gastritis, unspecified, without bleeding: Secondary | ICD-10-CM | POA: Diagnosis present

## 2020-09-13 DIAGNOSIS — K644 Residual hemorrhoidal skin tags: Secondary | ICD-10-CM | POA: Diagnosis present

## 2020-09-13 DIAGNOSIS — Z6835 Body mass index (BMI) 35.0-35.9, adult: Secondary | ICD-10-CM

## 2020-09-13 DIAGNOSIS — R809 Proteinuria, unspecified: Secondary | ICD-10-CM

## 2020-09-13 DIAGNOSIS — K922 Gastrointestinal hemorrhage, unspecified: Secondary | ICD-10-CM

## 2020-09-13 DIAGNOSIS — D62 Acute posthemorrhagic anemia: Secondary | ICD-10-CM | POA: Diagnosis present

## 2020-09-13 DIAGNOSIS — I959 Hypotension, unspecified: Secondary | ICD-10-CM | POA: Diagnosis not present

## 2020-09-13 DIAGNOSIS — D500 Iron deficiency anemia secondary to blood loss (chronic): Secondary | ICD-10-CM | POA: Diagnosis not present

## 2020-09-13 DIAGNOSIS — M109 Gout, unspecified: Secondary | ICD-10-CM | POA: Diagnosis present

## 2020-09-13 LAB — URINALYSIS, ROUTINE W REFLEX MICROSCOPIC
Bacteria, UA: NONE SEEN
Bilirubin Urine: NEGATIVE
Glucose, UA: NEGATIVE mg/dL
Hgb urine dipstick: NEGATIVE
Ketones, ur: NEGATIVE mg/dL
Leukocytes,Ua: NEGATIVE
Nitrite: NEGATIVE
Protein, ur: >=300 mg/dL — AB
Specific Gravity, Urine: 1.024 (ref 1.005–1.030)
pH: 6 (ref 5.0–8.0)

## 2020-09-13 LAB — BASIC METABOLIC PANEL
Anion gap: 6 (ref 5–15)
BUN: 23 mg/dL (ref 8–23)
CO2: 31 mmol/L (ref 22–32)
Calcium: 8.6 mg/dL — ABNORMAL LOW (ref 8.9–10.3)
Chloride: 105 mmol/L (ref 98–111)
Creatinine, Ser: 1.2 mg/dL (ref 0.61–1.24)
GFR, Estimated: >60 mL/min (ref >60)
Glucose, Bld: 102 mg/dL — ABNORMAL HIGH (ref 70–99)
Potassium: 3.6 mmol/L (ref 3.5–5.1)
Sodium: 142 mmol/L (ref 135–145)

## 2020-09-13 LAB — HEPATIC FUNCTION PANEL
ALT: 13 U/L (ref 0–44)
AST: 19 U/L (ref 15–41)
Albumin: 3.2 g/dL — ABNORMAL LOW (ref 3.5–5.0)
Alkaline Phosphatase: 61 U/L (ref 38–126)
Bilirubin, Direct: 0.2 mg/dL (ref 0.0–0.2)
Indirect Bilirubin: 0.5 mg/dL (ref 0.3–0.9)
Total Bilirubin: 0.7 mg/dL (ref 0.3–1.2)
Total Protein: 6.1 g/dL — ABNORMAL LOW (ref 6.5–8.1)

## 2020-09-13 LAB — IRON AND TIBC
Iron: 11 ug/dL — ABNORMAL LOW (ref 45–182)
Saturation Ratios: 3 % — ABNORMAL LOW (ref 17.9–39.5)
TIBC: 386 ug/dL (ref 250–450)
UIBC: 375 ug/dL

## 2020-09-13 LAB — TYPE AND SCREEN
ABO/RH(D): A POS
Antibody Screen: NEGATIVE

## 2020-09-13 LAB — CBC
HCT: 24.1 % — ABNORMAL LOW (ref 39.0–52.0)
Hemoglobin: 7.3 g/dL — ABNORMAL LOW (ref 13.0–17.0)
MCH: 29.6 pg (ref 26.0–34.0)
MCHC: 30.3 g/dL (ref 30.0–36.0)
MCV: 97.6 fL (ref 80.0–100.0)
Platelets: 231 10*3/uL (ref 150–400)
RBC: 2.47 MIL/uL — ABNORMAL LOW (ref 4.22–5.81)
RDW: 15.4 % (ref 11.5–15.5)
WBC: 5.4 10*3/uL (ref 4.0–10.5)
nRBC: 0 % (ref 0.0–0.2)

## 2020-09-13 LAB — TROPONIN I (HIGH SENSITIVITY)
Troponin I (High Sensitivity): 27 ng/L — ABNORMAL HIGH (ref <18)
Troponin I (High Sensitivity): 28 ng/L — ABNORMAL HIGH (ref <18)

## 2020-09-13 LAB — POC OCCULT BLOOD, ED: Fecal Occult Bld: POSITIVE — AB

## 2020-09-13 LAB — ABO/RH: ABO/RH(D): A POS

## 2020-09-13 LAB — CBG MONITORING, ED: Glucose-Capillary: 85 mg/dL (ref 70–99)

## 2020-09-13 MED ORDER — COLCHICINE 0.6 MG PO TABS
0.6000 mg | ORAL_TABLET | Freq: Two times a day (BID) | ORAL | Status: DC
  Administered 2020-09-14 – 2020-09-16 (×5): 0.6 mg via ORAL
  Filled 2020-09-13 (×6): qty 1

## 2020-09-13 MED ORDER — ONDANSETRON HCL 4 MG/2ML IJ SOLN: 4.0000 mg | Freq: Four times a day (QID) | INTRAMUSCULAR | Status: DC | PRN

## 2020-09-13 MED ORDER — HYDROCORTISONE ACETATE 25 MG RE SUPP: 25.0000 mg | Freq: Two times a day (BID) | RECTAL | 0 refills | Status: DC

## 2020-09-13 MED ORDER — FUROSEMIDE 40 MG PO TABS: 40.0000 mg | ORAL_TABLET | Freq: Two times a day (BID) | ORAL | Status: DC

## 2020-09-13 MED ORDER — INSULIN ASPART 100 UNIT/ML IJ SOLN
0.0000 [IU] | Freq: Three times a day (TID) | INTRAMUSCULAR | Status: DC
Start: 2020-09-14 — End: 2020-09-15
  Administered 2020-09-14: 2 [IU] via SUBCUTANEOUS

## 2020-09-13 MED ORDER — SODIUM CHLORIDE 0.9% FLUSH
3.0000 mL | Freq: Two times a day (BID) | INTRAVENOUS | Status: DC
  Administered 2020-09-13 – 2020-09-16 (×6): 3 mL via INTRAVENOUS

## 2020-09-13 MED ORDER — ONDANSETRON HCL 4 MG PO TABS: 4.0000 mg | ORAL_TABLET | Freq: Four times a day (QID) | ORAL | Status: DC | PRN

## 2020-09-13 MED ORDER — LISINOPRIL 10 MG PO TABS
40.0000 mg | ORAL_TABLET | Freq: Every day | ORAL | Status: DC
  Filled 2020-09-13: qty 4

## 2020-09-13 MED ORDER — DILTIAZEM HCL ER COATED BEADS 240 MG PO CP24: 240.0000 mg | ORAL_CAPSULE | Freq: Every day | ORAL | Status: DC

## 2020-09-13 MED ORDER — ATORVASTATIN CALCIUM 40 MG PO TABS
40.0000 mg | ORAL_TABLET | Freq: Every day | ORAL | Status: DC
  Administered 2020-09-14 – 2020-09-15 (×2): 40 mg via ORAL
  Filled 2020-09-13 (×2): qty 1

## 2020-09-13 MED ORDER — SODIUM CHLORIDE 0.9 % IV SOLN: 250.0000 mL | INTRAVENOUS | Status: DC | PRN

## 2020-09-13 MED ORDER — SODIUM CHLORIDE 0.9% FLUSH: 3.0000 mL | INTRAVENOUS | Status: DC | PRN

## 2020-09-13 MED ORDER — METOPROLOL TARTRATE 25 MG PO TABS
25.0000 mg | ORAL_TABLET | Freq: Two times a day (BID) | ORAL | Status: DC
  Administered 2020-09-13 – 2020-09-15 (×4): 25 mg via ORAL
  Filled 2020-09-13 (×4): qty 1

## 2020-09-13 MED ORDER — ACETAMINOPHEN 650 MG RE SUPP: 650.0000 mg | Freq: Four times a day (QID) | RECTAL | Status: DC | PRN

## 2020-09-13 MED ORDER — PANTOPRAZOLE SODIUM 40 MG IV SOLR
40.0000 mg | Freq: Once | INTRAVENOUS | Status: AC
  Administered 2020-09-13: 40 mg via INTRAVENOUS
  Filled 2020-09-13: qty 40

## 2020-09-13 MED ORDER — ACETAMINOPHEN 325 MG PO TABS
650.0000 mg | ORAL_TABLET | Freq: Four times a day (QID) | ORAL | Status: DC | PRN
  Administered 2020-09-14: 650 mg via ORAL
  Filled 2020-09-13: qty 2

## 2020-09-13 MED ORDER — PANTOPRAZOLE SODIUM 40 MG IV SOLR
40.0000 mg | INTRAVENOUS | Status: DC
  Administered 2020-09-13: 40 mg via INTRAVENOUS

## 2020-09-13 NOTE — H&P (Addendum)
History and Physical    Dennis Swanson [id number] DOB: [birth year] DOA: 09/13/2020  PCP: Pcp, No   Patient coming from: Home  I have personally briefly reviewed patient's old medical records in [place]  Chief Complaint: Leg swelling  HPI: Dennis Swanson is a 65 y.o. male with medical history significant for chronic diastolic dysfunction CHF, chronic atrial fibrillation on anticoagulation therapy, hypertension, history of pulmonary embolism who presents to the emergency room for evaluation of bilateral lower extremity swelling for a month. Patient states that he was incarcerated for 6 months and during that time he noticed swelling in both lower extremities.  He has been on Lasix which he takes as recommended without any significant improvement in symptoms. He denies having any shortness of breath, no chest pain, no orthopnea, no palpitations, no diaphoresis. Labs done in the emergency room showed a hemoglobin of 7.3g/dl down from 14.4g/dl, 3 years ago. Patient admits to occasional constipation and straining when having bowel movements with passage of bright red blood per rectum and occasional intact blood.  He denies having any melena stools or hematemesis.  He also admits to use of nonsteroidal anti-inflammatory agents but denies having any abdominal pain. He complains of occasional dizziness especially when he gets up from a supine position but denies having any falls or loss of consciousness.  He denies having any fever, no chills, no cough, no blurred vision no focal deficits. Labs show sodium 142, potassium 3.6, chloride 105, bicarb 31, glucose 102, BUN 23, creatinine 1.20, calcium 8.6, alkaline phosphatase 61, albumin 3.2, AST 19, ALT 13, total protein 6.1, troponin 28, white count 5.4, hemoglobin 7.3, hematocrit 24.1, MCV 97.6, RDW 15.4, platelet count 231, PT 19.1, INR 1.62 Chest x-ray reviewed by me shows no evidence of acute cardiopulmonary disease Twelve-lead EKG  reviewed by me shows atrial fibrillation with an incomplete right bundle branch block.   ED Course: Patient is a 65 year old African-American male who presents to the emergency room for evaluation of bilateral lower extremity swelling but is found to have a hemoglobin of 7.3g/dl down from 14.4g/dl, 3 years ago. He admits to having constipation as well as occasional hematochezia and also admits to NSAID use. He will be referred to observation status for further evaluation.  Review of Systems: As per HPI otherwise all other systems reviewed and negative.    Past Medical History:  Diagnosis Date   Acute pulmonary embolism ([place]) 2011   Alcohol abuse    Atrial flutter ([facility])    CHF (congestive heart failure) ([place])    Hypertension    Lower extremity cellulitis    bilateral   Systolic dysfunction     Past Surgical History:  Procedure Laterality Date   COLONOSCOPY N/A 05/17/2012   Procedure: COLONOSCOPY;  Surgeon: Danie Binder, MD;  Location: [facility];  Service: Endoscopy;  Laterality: N/A;  10:30   None       reports that he has never smoked. He has never used smokeless tobacco. He reports current alcohol use of about 3.0 standard drinks of alcohol per week. He reports that he does not use drugs.  No Known Allergies  Family History  Problem Relation Age of Onset   Heart attack Mother    Hypertension Brother    Diabetes Brother    Cancer Father    Colon cancer Neg Hx       Prior to Admission medications   Medication Sig Start Date End Date Taking? Authorizing Provider  colchicine 0.6  MG tablet Take 0.6 mg by mouth 2 (two) times daily. 08/05/20  Yes [provider]  ELIQUIS 2.5 MG TABS tablet Take 2.5 mg by mouth daily at 6 (six) AM. 08/05/20  Yes [provider]  furosemide (LASIX) 40 MG tablet TAKE ONE TABLET BY MOUTH IN THE MORNING AND ONE-HALF IN THE EVENING Patient taking differently: Take 40 mg by mouth 2 (two) times daily. 11/02/16  Yes Branch,  Alphonse Guild, MD  hydrocortisone (ANUSOL-HC) 25 MG suppository Place 1 suppository (25 mg total) rectally 2 (two) times daily. 09/13/20  Yes Idol, Almyra Free, PA-C  lisinopril (PRINIVIL,ZESTRIL) 40 MG tablet Take 1 tablet (40 mg total) by mouth daily. 01/13/16  Yes Lendon Colonel, NP  omeprazole (PRILOSEC) 20 MG capsule Take 20 mg by mouth daily. 08/05/20  Yes [provider]  atorvastatin (LIPITOR) 40 MG tablet Take 1 tablet (40 mg total) by mouth daily at 6 PM. Patient not taking: Reported on 09/13/2020 01/13/16   Lendon Colonel, NP  CARTIA XT 240 MG 24 hr capsule TAKE ONE CAPSULE BY MOUTH ONCE DAILY Patient not taking: Reported on 09/13/2020 02/08/17   Arnoldo Lenis, MD  indomethacin (INDOCIN) 50 MG capsule Take by mouth. Patient not taking: Reported on 09/13/2020 06/18/20   [provider]  KLOR-CON 20 MEQ packet DISSOLVE & TAKE 1 POWDER PACKET BY MOUTH ONCE DAILY Patient not taking: Reported on 09/13/2020 03/03/19   Arnoldo Lenis, MD  metFORMIN (GLUCOPHAGE) 500 MG tablet Take 1 tablet (500 mg total) by mouth 2 (two) times daily with a meal. Patient not taking: Reported on 09/13/2020 05/03/15   Arnoldo Lenis, MD  metoprolol succinate (TOPROL-XL) 50 MG 24 hr tablet TAKE 1 TABLET BY MOUTH ONCE DAILY WITH  OR  IMMEDIATELY  FOLLOWING  A  MEAL  (TAKE  WITH  100  MG  DOSE  FOR  A  TOTAL  OF  150  MG  DAILY) Patient not taking: Reported on 09/13/2020 11/15/18   Arnoldo Lenis, MD  ranitidine (ZANTAC) 150 MG capsule Take 150 mg by mouth 2 (two) times daily. Patient not taking: Reported on 09/13/2020    [provider]  XARELTO 20 MG TABS tablet TAKE 1 TABLET BY MOUTH ONCE DAILY WITH SUPPER Patient not taking: Reported on 09/13/2020 07/05/18   Arnoldo Lenis, MD    Physical Exam: Vitals:   09/13/20 1530 09/13/20 1800 09/13/20 1932 09/13/20 1947  BP: (!) 137/96 136/90 (!) 139/92   Pulse: 67 73 78   Resp: '20 18 20   '$ Temp:      TempSrc:      SpO2: 98% 96%  96%   Weight:    117.9 kg  Height:    6' (1.829 m)     Vitals:   09/13/20 1530 09/13/20 1800 09/13/20 1932 09/13/20 1947  BP: (!) 137/96 136/90 (!) 139/92   Pulse: 67 73 78   Resp: '20 18 20   '$ Temp:      TempSrc:      SpO2: 98% 96% 96%   Weight:    117.9 kg  Height:    6' (1.829 m)      Constitutional: Alert and oriented x 3 . Not in any apparent distress HEENT:      Head: Normocephalic and atraumatic.         Eyes: PERLA, EOMI, Conjunctivae pallor. Sclera is non-icteric.       Mouth/Throat: Mucous membranes are moist.  Neck: Supple with no signs of meningismus. Cardiovascular: Regular rate and rhythm. No murmurs, gallops, or rubs. 2+ symmetrical distal pulses are present . No JVD.  Trace LE edema Respiratory: Respiratory effort normal .Lungs sounds clear bilaterally. No wheezes, crackles, or rhonchi.  Gastrointestinal: Soft, non tender, and non distended with positive bowel sounds.  Genitourinary: No CVA tenderness. Musculoskeletal: Nontender with normal range of motion in all extremities. No cyanosis, or erythema of extremities. Neurologic:  Face is symmetric. Moving all extremities. No gross focal neurologic deficits . Skin: Skin is warm, dry.  No rash or ulcers Psychiatric: Mood and affect are normal    Labs on Admission: I have personally reviewed following labs and imaging studies  CBC: Recent Labs  Lab 09/13/20 1213  WBC 5.4  HGB 7.3*  HCT 24.1*  MCV 97.6  PLT [id number]   Basic Metabolic Panel: Recent Labs  Lab 09/13/20 1213  NA 142  K 3.6  CL 105  CO2 31  GLUCOSE 102*  BUN 23  CREATININE 1.20  CALCIUM 8.6*   GFR: Estimated Creatinine Clearance: 80.3 mL/min (by C-G formula based on SCr of 1.2 mg/dL). Liver Function Tests: Recent Labs  Lab 09/13/20 1412  AST 19  ALT 13  ALKPHOS 61  BILITOT 0.7  PROT 6.1*  ALBUMIN 3.2*   No results for input(s): LIPASE, AMYLASE in the last 168 hours. No results for input(s): AMMONIA in the last 168  hours. Coagulation Profile: No results for input(s): INR, PROTIME in the last 168 hours. Cardiac Enzymes: No results for input(s): CKTOTAL, CKMB, CKMBINDEX, TROPONINI in the last 168 hours. BNP (last 3 results) No results for input(s): PROBNP in the last 8760 hours. HbA1C: No results for input(s): HGBA1C in the last 72 hours. CBG: No results for input(s): GLUCAP in the last 168 hours. Lipid Profile: No results for input(s): CHOL, HDL, LDLCALC, TRIG, CHOLHDL, LDLDIRECT in the last 72 hours. Thyroid Function Tests: No results for input(s): TSH, T4TOTAL, FREET4, T3FREE, THYROIDAB in the last 72 hours. Anemia Panel: No results for input(s): VITAMINB12, FOLATE, FERRITIN, TIBC, IRON, RETICCTPCT in the last 72 hours. Urine analysis:    Component Value Date/Time   COLORURINE YELLOW 09/13/2020 1603   APPEARANCEUR CLEAR 09/13/2020 1603   LABSPEC 1.024 09/13/2020 1603   PHURINE 6.0 09/13/2020 1603   GLUCOSEU NEGATIVE 09/13/2020 1603   HGBUR NEGATIVE 09/13/2020 1603   BILIRUBINUR NEGATIVE 09/13/2020 1603   [place] 09/13/2020 1603   PROTEINUR >=300 (A) 09/13/2020 1603   UROBILINOGEN 0.2 08/20/2009 1446   NITRITE NEGATIVE 09/13/2020 1603   LEUKOCYTESUR NEGATIVE 09/13/2020 1603    Radiological Exams on Admission: DG Chest 2 View  Result Date: 09/13/2020 CLINICAL DATA:  Leg swelling. EXAM: CHEST - 2 VIEW COMPARISON:  11/09/2016 FINDINGS: Cardiac enlargement. No pleural effusion or edema. No airspace opacities identified. Thoracic spondylosis noted. IMPRESSION: 1. Cardiac enlargement. 2. No heart failure. Electronically Signed   By: Kerby Moors M.D.   On: 09/13/2020 12:38     Assessment/Plan Principal Problem:   Anemia Active Problems:   Essential hypertension   Chronic diastolic CHF (congestive heart failure) (HCC)     Anemia Most likely related to acute blood loss from episodes of rectal bleeding rule out concomitant peptic ulcer disease from NSAID use Patient noted  to have a hemoglobin of 7.3 g/dl compared to 14.4g/dl, 3 years ago Hold apixaban Place patient on Protonix 40 mg IV daily We will transfuse for hemoglobin less than 7g/dl Obtain iron profile Consult gastroenterology for possible  upper/lower endoscopy    Chronic atrial fibrillation Continue Metoprolol for rate control Hold apixaban due to significant anemia    Hypertension Continue Metoprolol and lisinopril   Diabetes mellitus Hold metformin Place patient on a clear liquid diet Sliding scale insulin for glycemic control    Chronic diastolic dysfunction CHF Not acutely exacerbated Hold Lasix    Obesity (BMI 35) Complicates overall prognosis and care    DVT prophylaxis: SCD Code Status: full code  Family Communication: Greater than 50% of time was spent discussing patient's condition and plan of care with him at the bedside.  All questions and concerns have been addressed.  He verbalizes understanding and agrees with the plan. Disposition Plan: Back to previous home environment Consults called: Gastroenterology Status: Observation    Laurencia Roma MD [facility]     09/13/2020, 8:40 PM

## 2020-09-13 NOTE — Discharge Instructions (Addendum)
Your lab tests today show [place] that your leg swelling will not be improved by increasing your fluid pill (lasix) as this is coming from a kidney problem.  I have referred you to a kidney specialist - call for an appointment next week.    Elevation of your legs and compression stockings can help reduce swelling in the interim.  Continue taking your lasix but do not increase your dosing.   I also suggest you re-establish care with Dr Harl Bowie for ongoing cardiac care.   You do have significant anemia as discussed.  The blood that you see when you have hard bowel movements may be from your hemorrhoid, but you do need to talk to a gastroenterologist to consider having a colonoscopy.  In the interim get rechecked immediately if you have any increasing weakness, dizziness or chest pain or shortness of breath, these can be signs of a worsening anemia which might indicate a need for a blood transfusion.  You have been prescribed a medicine to help heal your hemorrhoid.  Sitting in a warm tub of water several times daily also can help this heal.

## 2020-09-13 NOTE — ED Provider Notes (Signed)
[facility] EMERGENCY DEPARTMENT Provider Note   CSN: [id number] Arrival date & time: 09/13/20  1111     History Chief Complaint  Patient presents with   Leg Swelling    Dennis Swanson is a 65 y.o. male with a history of pulmonary embolism, alcohol abuse, history of CHF, atrial fibrillation on Xarelto, hypertension presenting for evaluation of approximately 1 month history of bilateral leg swelling.  He reports taking Lasix twice daily, but was recently incarcerated from February until just several weeks ago and states that during that incarceration he did not get his medicines the way he was supposed to.  He presents with a bubble pack of medications however from the jail which indicates that he was actually getting Lasix 40 mg twice daily along with Eliquis in place of his former Xarelto.  He reports soreness and tightness in his legs secondary to swelling, gets worse when he has been on his feet a long time, better with elevation.  He denies cough, shortness of breath and chest pain.  He also does denies nausea or vomiting, abdominal pain, no urinary complaints.  He was placed on colchicine while in jail for an episode of pain in his right foot which has since resolved, he is not convinced he ever had gout however.  The history is provided by the patient.      Past Medical History:  Diagnosis Date   Acute pulmonary embolism ([place]) 2011   Alcohol abuse    Atrial flutter (HCC)    CHF (congestive heart failure) ([place])    Hypertension    Lower extremity cellulitis    bilateral   Systolic dysfunction     Patient Active Problem List   Diagnosis Date Noted   Anemia 09/13/2020   Chronic diastolic CHF (congestive heart failure) ([place]) 09/13/2020   Obesity (BMI 30.0-34.9) 09/13/2020   Rectal bleeding 02/24/2017   Morbid obesity ([place]) 02/24/2017   Acute exacerbation of CHF (congestive heart failure) ([place]) 12/06/2015   Encounter for therapeutic drug monitoring 04/10/2015   Atrial  fibrillation ([place]) 04/03/2015   Nonischemic cardiomyopathy ([place]) 04/03/2015   Current use of long term anticoagulation 04/03/2015   Colon cancer screening 04/27/2012   Essential hypertension 04/27/2012   CHF 12/17/2009   ALCOHOL ABUSE 09/27/2009    Past Surgical History:  Procedure Laterality Date   COLONOSCOPY N/A 05/17/2012   Procedure: COLONOSCOPY;  Surgeon: Danie Binder, MD;  Location: [facility];  Service: Endoscopy;  Laterality: N/A;  10:30   None         Family History  Problem Relation Age of Onset   Heart attack Mother    Hypertension Brother    Diabetes Brother    Cancer Father    Colon cancer Neg Hx     Social History   Tobacco Use   Smoking status: Never   Smokeless tobacco: Never  Vaping Use   Vaping Use: Never used  Substance Use Topics   Alcohol use: Yes    Alcohol/week: 3.0 standard drinks    Types: 3 Cans of beer per week    Comment: once a week   Drug use: No    Home Medications Prior to Admission medications   Medication Sig Start Date End Date Taking? Authorizing Provider  colchicine 0.6 MG tablet Take 0.6 mg by mouth 2 (two) times daily. 08/05/20  Yes [provider]  ELIQUIS 2.5 MG TABS tablet Take 2.5 mg by mouth daily at 6 (six) AM. 08/05/20  Yes [provider]  furosemide (LASIX) 40 MG tablet TAKE ONE TABLET BY MOUTH IN THE MORNING AND ONE-HALF IN THE EVENING Patient taking differently: Take 40 mg by mouth 2 (two) times daily. 11/02/16  Yes Branch, Alphonse Guild, MD  hydrocortisone (ANUSOL-HC) 25 MG suppository Place 1 suppository (25 mg total) rectally 2 (two) times daily. 09/13/20  Yes Jamile Rekowski, Almyra Free, PA-C  lisinopril (PRINIVIL,ZESTRIL) 40 MG tablet Take 1 tablet (40 mg total) by mouth daily. 01/13/16  Yes Lendon Colonel, NP  omeprazole (PRILOSEC) 20 MG capsule Take 20 mg by mouth daily. 08/05/20  Yes [provider]  atorvastatin (LIPITOR) 40 MG tablet Take 1 tablet (40 mg total) by mouth daily at 6 PM. Patient  not taking: Reported on 09/13/2020 01/13/16   Lendon Colonel, NP  CARTIA XT 240 MG 24 hr capsule TAKE ONE CAPSULE BY MOUTH ONCE DAILY Patient not taking: Reported on 09/13/2020 02/08/17   Arnoldo Lenis, MD  indomethacin (INDOCIN) 50 MG capsule Take by mouth. Patient not taking: Reported on 09/13/2020 06/18/20   [provider]  KLOR-CON 20 MEQ packet DISSOLVE & TAKE 1 POWDER PACKET BY MOUTH ONCE DAILY Patient not taking: Reported on 09/13/2020 03/03/19   Arnoldo Lenis, MD  metFORMIN (GLUCOPHAGE) 500 MG tablet Take 1 tablet (500 mg total) by mouth 2 (two) times daily with a meal. Patient not taking: Reported on 09/13/2020 05/03/15   Arnoldo Lenis, MD  metoprolol succinate (TOPROL-XL) 50 MG 24 hr tablet TAKE 1 TABLET BY MOUTH ONCE DAILY WITH  OR  IMMEDIATELY  FOLLOWING  A  MEAL  (TAKE  WITH  100  MG  DOSE  FOR  A  TOTAL  OF  150  MG  DAILY) Patient not taking: Reported on 09/13/2020 11/15/18   Arnoldo Lenis, MD  ranitidine (ZANTAC) 150 MG capsule Take 150 mg by mouth 2 (two) times daily. Patient not taking: Reported on 09/13/2020    [provider]  XARELTO 20 MG TABS tablet TAKE 1 TABLET BY MOUTH ONCE DAILY WITH SUPPER Patient not taking: Reported on 09/13/2020 07/05/18   Arnoldo Lenis, MD    Allergies    Patient has no known allergies.  Review of Systems   Review of Systems  Constitutional:  Negative for fever.  HENT:  Negative for congestion and sore throat.   Eyes: Negative.   Respiratory:  Negative for chest tightness and shortness of breath.   Cardiovascular:  Positive for leg swelling. Negative for chest pain and palpitations.  Gastrointestinal:  Negative for abdominal pain, nausea and vomiting.  Genitourinary: Negative.   Musculoskeletal:  Negative for arthralgias, joint swelling and neck pain.  Skin: Negative.  Negative for rash and wound.  Neurological:  Negative for dizziness, weakness, light-headedness, numbness and headaches.   Psychiatric/Behavioral: Negative.     Physical Exam Updated Vital Signs BP (!) 141/98   Pulse 76   Temp 99.3 F (37.4 C) (Oral)   Resp (!) 23   Ht 6' (1.829 m)   Wt 117.9 kg   SpO2 93%   BMI 35.26 kg/m   Physical Exam Vitals and nursing note reviewed. Exam conducted with a chaperone present.  Constitutional:      Appearance: He is well-developed.  HENT:     Head: Normocephalic and atraumatic.  Eyes:     Conjunctiva/sclera: Conjunctivae normal.  Cardiovascular:     Rate and Rhythm: Normal rate and regular rhythm.     Heart sounds: Normal heart sounds.  Pulmonary:  Effort: Pulmonary effort is normal.     Breath sounds: Normal breath sounds. No wheezing.  Abdominal:     General: Bowel sounds are normal.     Palpations: Abdomen is soft.     Tenderness: There is no abdominal tenderness.  Genitourinary:    Rectum: External hemorrhoid present.     Comments: Excoriated external hemorrhoid, nonthrombosed. Musculoskeletal:        General: Normal range of motion.     Cervical back: Normal range of motion.     Right lower leg: Edema present.     Left lower leg: Edema present.     Comments: Patient with moderate bilateral lower extremity edema to knees, pitting.  Legs are uniform and edema.  Dorsalis pedis pulses are present.  Skin:    General: Skin is warm and dry.  Neurological:     Mental Status: He is alert.    ED Results / Procedures / Treatments   Labs (all labs ordered are listed, but only abnormal results are displayed) Labs Reviewed  BASIC METABOLIC PANEL - Abnormal; Notable for the following components:      Result Value   Glucose, Bld 102 (*)    Calcium 8.6 (*)    All other components within normal limits  CBC - Abnormal; Notable for the following components:   RBC 2.47 (*)    Hemoglobin 7.3 (*)    HCT 24.1 (*)    All other components within normal limits  HEPATIC FUNCTION PANEL - Abnormal; Notable for the following components:   Total Protein 6.1 (*)     Albumin 3.2 (*)    All other components within normal limits  URINALYSIS, ROUTINE W REFLEX MICROSCOPIC - Abnormal; Notable for the following components:   Protein, ur >=300 (*)    All other components within normal limits  POC OCCULT BLOOD, ED - Abnormal; Notable for the following components:   Fecal Occult Bld POSITIVE (*)    All other components within normal limits  TROPONIN I (HIGH SENSITIVITY) - Abnormal; Notable for the following components:   Troponin I (High Sensitivity) 27 (*)    All other components within normal limits  TROPONIN I (HIGH SENSITIVITY) - Abnormal; Notable for the following components:   Troponin I (High Sensitivity) 28 (*)    All other components within normal limits  HIV ANTIBODY (ROUTINE TESTING W REFLEX)  IRON AND TIBC  HEMOGLOBIN A1C  TYPE AND SCREEN    EKG EKG Interpretation  Date/Time:  Friday September 13 2020 11:48:16 EDT Ventricular Rate:  75 PR Interval:    QRS Duration: 106 QT Interval:  412 QTC Calculation: 460 R Axis:   -17 Text Interpretation: Atrial fibrillation with a competing junctional pacemaker Incomplete right bundle branch block Abnormal QRS-T angle, consider primary T wave abnormality Abnormal ECG No significant change since last tracing Confirmed by Calvert Cantor [phone]) on 09/13/2020 3:47:21 PM  Radiology DG Chest 2 View  Result Date: 09/13/2020 CLINICAL DATA:  Leg swelling. EXAM: CHEST - 2 VIEW COMPARISON:  11/09/2016 FINDINGS: Cardiac enlargement. No pleural effusion or edema. No airspace opacities identified. Thoracic spondylosis noted. IMPRESSION: 1. Cardiac enlargement. 2. No heart failure. Electronically Signed   By: Kerby Moors M.D.   On: 09/13/2020 12:38    Procedures Procedures   Medications Ordered in ED Medications  colchicine tablet 0.6 mg (0.6 mg Oral Not Given 09/13/20 2141)  atorvastatin (LIPITOR) tablet 40 mg (has no administration in time range)  lisinopril (ZESTRIL) tablet 40 mg (has no administration  in time range)  sodium chloride flush (NS) 0.9 % injection 3 mL (3 mLs Intravenous Given 09/13/20 2142)  sodium chloride flush (NS) 0.9 % injection 3 mL (has no administration in time range)  0.9 %  sodium chloride infusion (has no administration in time range)  acetaminophen (TYLENOL) tablet 650 mg (has no administration in time range)    Or  acetaminophen (TYLENOL) suppository 650 mg (has no administration in time range)  ondansetron (ZOFRAN) tablet 4 mg (has no administration in time range)    Or  ondansetron (ZOFRAN) injection 4 mg (has no administration in time range)  pantoprazole (PROTONIX) injection 40 mg (40 mg Intravenous Given 09/13/20 2134)  insulin aspart (novoLOG) injection 0-15 Units (has no administration in time range)  metoprolol tartrate (LOPRESSOR) tablet 25 mg (25 mg Oral Given 09/13/20 2137)  pantoprazole (PROTONIX) injection 40 mg (40 mg Intravenous Given 09/13/20 2012)    ED Course  I have reviewed the triage vital signs and the nursing notes.  Pertinent labs & imaging results that were available during my care of the patient were reviewed by me and considered in my medical decision making (see chart for details).    MDM Rules/Calculators/A&P                           Labs and imaging reviewed and discussed with patient.  He has a thickened normocytic anemia at 7.3.  His last hemoglobin to compare was 14 but this was 3 years ago.  Discussed this finding with the patient and he does state that he sees blood occasionally with bowel movements especially when he has constipation and he strains very hard which he has always felt is due to his hemorrhoid.  He has seen Dr. Oneida Alar in the past, had a screening colonoscopy about 8 years ago which was negative for that for a small polyp and internal hemorrhoids.  Other pertinent lab findings include a large protein urea along with hypoalbuminemia suggesting a renal source for patient's peripheral edema.  He would not benefit from  increasing his Lasix.  He will need nephrology consultation.  Discussed patient's hemoglobin and Hemoccult status with Dr. Abbey Chatters of GI who recommends admission and he will see the patient in the morning for anticipation of upper and lower endoscopy.  Recommended clear diet and Protonix which has been ordered.  This is a symptomatic anemia, there is no emergent indication for transfusion.  Also of note patient has modestly elevated troponins of 27 and 28, technically these are delta negative.  Patient denies chest pain or shortness of breath as part of his presentation.  Also discussed with Dr. Francine Graven of the hospitalist service who accepts patient for admission. Final Clinical Impression(s) / ED Diagnoses Final diagnoses:  Peripheral edema  Anemia, unspecified type  Proteinuria, unspecified type  Inflamed external hemorrhoid    Rx / DC Orders ED Discharge Orders          Ordered    hydrocortisone (ANUSOL-HC) 25 MG suppository  2 times daily        09/13/20 1909             Landis Martins 09/13/20 2144    Truddie Hidden, MD 09/13/20 [phone]

## 2020-09-13 NOTE — ED Triage Notes (Signed)
Legs swelling x 1 month

## 2020-09-13 NOTE — ED Notes (Signed)
Patient does not want to take gout medication.

## 2020-09-14 ENCOUNTER — Observation Stay (HOSPITAL_COMMUNITY): Payer: Medicaid Other

## 2020-09-14 DIAGNOSIS — E669 Obesity, unspecified: Secondary | ICD-10-CM | POA: Diagnosis present

## 2020-09-14 DIAGNOSIS — I130 Hypertensive heart and chronic kidney disease with heart failure and stage 1 through stage 4 chronic kidney disease, or unspecified chronic kidney disease: Secondary | ICD-10-CM | POA: Diagnosis present

## 2020-09-14 DIAGNOSIS — N1831 Chronic kidney disease, stage 3a: Secondary | ICD-10-CM | POA: Diagnosis present

## 2020-09-14 DIAGNOSIS — K922 Gastrointestinal hemorrhage, unspecified: Secondary | ICD-10-CM | POA: Diagnosis not present

## 2020-09-14 DIAGNOSIS — I5042 Chronic combined systolic (congestive) and diastolic (congestive) heart failure: Secondary | ICD-10-CM

## 2020-09-14 DIAGNOSIS — I482 Chronic atrial fibrillation, unspecified: Secondary | ICD-10-CM | POA: Diagnosis present

## 2020-09-14 DIAGNOSIS — Z79899 Other long term (current) drug therapy: Secondary | ICD-10-CM | POA: Diagnosis not present

## 2020-09-14 DIAGNOSIS — K648 Other hemorrhoids: Secondary | ICD-10-CM | POA: Diagnosis present

## 2020-09-14 DIAGNOSIS — Z20822 Contact with and (suspected) exposure to covid-19: Secondary | ICD-10-CM | POA: Diagnosis present

## 2020-09-14 DIAGNOSIS — I2602 Saddle embolus of pulmonary artery with acute cor pulmonale: Secondary | ICD-10-CM | POA: Diagnosis not present

## 2020-09-14 DIAGNOSIS — Z8719 Personal history of other diseases of the digestive system: Secondary | ICD-10-CM | POA: Diagnosis not present

## 2020-09-14 DIAGNOSIS — R001 Bradycardia, unspecified: Secondary | ICD-10-CM | POA: Diagnosis not present

## 2020-09-14 DIAGNOSIS — K2961 Other gastritis with bleeding: Secondary | ICD-10-CM | POA: Diagnosis not present

## 2020-09-14 DIAGNOSIS — R809 Proteinuria, unspecified: Secondary | ICD-10-CM

## 2020-09-14 DIAGNOSIS — K2970 Gastritis, unspecified, without bleeding: Secondary | ICD-10-CM | POA: Diagnosis present

## 2020-09-14 DIAGNOSIS — K5730 Diverticulosis of large intestine without perforation or abscess without bleeding: Secondary | ICD-10-CM | POA: Diagnosis present

## 2020-09-14 DIAGNOSIS — I10 Essential (primary) hypertension: Secondary | ICD-10-CM | POA: Diagnosis not present

## 2020-09-14 DIAGNOSIS — Z86711 Personal history of pulmonary embolism: Secondary | ICD-10-CM | POA: Diagnosis not present

## 2020-09-14 DIAGNOSIS — K644 Residual hemorrhoidal skin tags: Secondary | ICD-10-CM | POA: Diagnosis present

## 2020-09-14 DIAGNOSIS — K921 Melena: Secondary | ICD-10-CM | POA: Diagnosis present

## 2020-09-14 DIAGNOSIS — Z7901 Long term (current) use of anticoagulants: Secondary | ICD-10-CM | POA: Diagnosis not present

## 2020-09-14 DIAGNOSIS — K6289 Other specified diseases of anus and rectum: Secondary | ICD-10-CM | POA: Diagnosis not present

## 2020-09-14 DIAGNOSIS — E1122 Type 2 diabetes mellitus with diabetic chronic kidney disease: Secondary | ICD-10-CM | POA: Diagnosis present

## 2020-09-14 DIAGNOSIS — D62 Acute posthemorrhagic anemia: Secondary | ICD-10-CM | POA: Diagnosis present

## 2020-09-14 DIAGNOSIS — I472 Ventricular tachycardia: Secondary | ICD-10-CM | POA: Diagnosis not present

## 2020-09-14 DIAGNOSIS — Z8249 Family history of ischemic heart disease and other diseases of the circulatory system: Secondary | ICD-10-CM | POA: Diagnosis not present

## 2020-09-14 DIAGNOSIS — I4891 Unspecified atrial fibrillation: Secondary | ICD-10-CM

## 2020-09-14 DIAGNOSIS — D500 Iron deficiency anemia secondary to blood loss (chronic): Secondary | ICD-10-CM | POA: Diagnosis not present

## 2020-09-14 DIAGNOSIS — D649 Anemia, unspecified: Secondary | ICD-10-CM

## 2020-09-14 DIAGNOSIS — N183 Chronic kidney disease, stage 3 unspecified: Secondary | ICD-10-CM

## 2020-09-14 DIAGNOSIS — I959 Hypotension, unspecified: Secondary | ICD-10-CM | POA: Diagnosis not present

## 2020-09-14 DIAGNOSIS — Z6835 Body mass index (BMI) 35.0-35.9, adult: Secondary | ICD-10-CM | POA: Diagnosis not present

## 2020-09-14 DIAGNOSIS — K449 Diaphragmatic hernia without obstruction or gangrene: Secondary | ICD-10-CM | POA: Diagnosis present

## 2020-09-14 DIAGNOSIS — D509 Iron deficiency anemia, unspecified: Secondary | ICD-10-CM | POA: Diagnosis present

## 2020-09-14 LAB — ECHOCARDIOGRAM COMPLETE
Area-P 1/2: 2.29 cm2
Height: 72 in
S' Lateral: 4.43 cm
Weight: 4160 oz

## 2020-09-14 LAB — HEMOGLOBIN A1C
Hgb A1c MFr Bld: 5.5 % (ref 4.8–5.6)
Mean Plasma Glucose: 111.15 mg/dL

## 2020-09-14 LAB — GLUCOSE, CAPILLARY
Glucose-Capillary: 107 mg/dL — ABNORMAL HIGH (ref 70–99)
Glucose-Capillary: 123 mg/dL — ABNORMAL HIGH (ref 70–99)
Glucose-Capillary: 78 mg/dL (ref 70–99)
Glucose-Capillary: 88 mg/dL (ref 70–99)
Glucose-Capillary: 97 mg/dL (ref 70–99)

## 2020-09-14 LAB — PROTEIN / CREATININE RATIO, URINE
Creatinine, Urine: 508.71 mg/dL
Protein Creatinine Ratio: 0.36 mg/mg{Cre} — ABNORMAL HIGH (ref 0.00–0.15)
Total Protein, Urine: 183 mg/dL

## 2020-09-14 LAB — RESP PANEL BY RT-PCR (FLU A&B, COVID) ARPGX2
Influenza A by PCR: NEGATIVE
Influenza B by PCR: NEGATIVE
SARS Coronavirus 2 by RT PCR: NEGATIVE

## 2020-09-14 LAB — HIV ANTIBODY (ROUTINE TESTING W REFLEX): HIV Screen 4th Generation wRfx: NONREACTIVE

## 2020-09-14 LAB — FERRITIN: Ferritin: 32 ng/mL (ref 24–336)

## 2020-09-14 MED ORDER — PANTOPRAZOLE SODIUM 40 MG IV SOLR
40.0000 mg | Freq: Two times a day (BID) | INTRAVENOUS | Status: DC
  Administered 2020-09-14 – 2020-09-16 (×5): 40 mg via INTRAVENOUS
  Filled 2020-09-14 (×5): qty 40

## 2020-09-14 MED ORDER — LISINOPRIL 10 MG PO TABS
10.0000 mg | ORAL_TABLET | Freq: Every day | ORAL | Status: DC
  Administered 2020-09-14 – 2020-09-15 (×2): 10 mg via ORAL
  Filled 2020-09-14: qty 1

## 2020-09-14 MED ORDER — PEG 3350-KCL-NA BICARB-NACL 420 G PO SOLR: Freq: Once | ORAL | Status: AC

## 2020-09-14 NOTE — Consult Note (Signed)
Consulting  Provider: Dr. Francine Graven Primary Care Physician:  Pcp, No Primary Gastroenterologist: Previously Dr. Oneida Alar, now Dr. Abbey Chatters  Reason for Consultation: GI bleeding, acute blood loss anemia  HPI:  Dennis Swanson is a 65 y.o. male with a past medical history of CHF, chronic atrial fibrillation on Eliquis, who presented to [facility], ER for worsening bilateral lower extremity edema.  Patient states he was incarcerated for 6 months and during that time he noticed worsening swelling in his lower extremities.  States there was some issues with him receiving all of his medications though he did continually receive his Eliquis for chronic A. fib.  Work-up in the ER revealed a hemoglobin of 7.3 down from 14.43 years ago.  Has noted both melena and hematochezia.  States his stools have been at times black but has also had bright red blood per rectum as well.  No abdominal pain or discomfort.  Does note taking Aleve regularly since his release from prison.  No dysphagia odynophagia.  Last colonoscopy 2014 with 1 small rectal polyp removed, benign pathology.  Does note family history of colon cancer in his dad at age 41 as well as his uncle.  No unintentional weight loss.  No previous upper endoscopy.  Past Medical History:  Diagnosis Date   Acute pulmonary embolism ([place]) 2011   Alcohol abuse    Atrial flutter (HCC)    CHF (congestive heart failure) ([place])    Hypertension    Lower extremity cellulitis    bilateral   Systolic dysfunction     Past Surgical History:  Procedure Laterality Date   COLONOSCOPY N/A 05/17/2012   Procedure: COLONOSCOPY;  Surgeon: Danie Binder, MD;  Location[facility];  Service: Endoscopy;  Laterality: N/A;  10:30   None      Prior to Admission medications   Medication Sig Start Date End Date Taking? Authorizing Provider  colchicine 0.6 MG tablet Take 0.6 mg by mouth 2 (two) times daily. 08/05/20  Yes [provider]  ELIQUIS 2.5 MG TABS tablet  Take 2.5 mg by mouth daily at 6 (six) AM. 08/05/20  Yes [provider]  furosemide (LASIX) 40 MG tablet TAKE ONE TABLET BY MOUTH IN THE MORNING AND ONE-HALF IN THE EVENING Patient taking differently: Take 40 mg by mouth 2 (two) times daily. 11/02/16  Yes Branch, Alphonse Guild, MD  hydrocortisone (ANUSOL-HC) 25 MG suppository Place 1 suppository (25 mg total) rectally 2 (two) times daily. 09/13/20  Yes Idol, Almyra Free, PA-C  lisinopril (PRINIVIL,ZESTRIL) 40 MG tablet Take 1 tablet (40 mg total) by mouth daily. 01/13/16  Yes Lendon Colonel, NP  omeprazole (PRILOSEC) 20 MG capsule Take 20 mg by mouth daily. 08/05/20  Yes [provider]  atorvastatin (LIPITOR) 40 MG tablet Take 1 tablet (40 mg total) by mouth daily at 6 PM. Patient not taking: Reported on 09/13/2020 01/13/16   Lendon Colonel, NP  CARTIA XT 240 MG 24 hr capsule TAKE ONE CAPSULE BY MOUTH ONCE DAILY Patient not taking: Reported on 09/13/2020 02/08/17   Arnoldo Lenis, MD  indomethacin (INDOCIN) 50 MG capsule Take by mouth. Patient not taking: Reported on 09/13/2020 06/18/20   [provider]  KLOR-CON 20 MEQ packet DISSOLVE & TAKE 1 POWDER PACKET BY MOUTH ONCE DAILY Patient not taking: Reported on 09/13/2020 03/03/19   Arnoldo Lenis, MD  metFORMIN (GLUCOPHAGE) 500 MG tablet Take 1 tablet (500 mg total) by mouth 2 (two) times daily with a meal. Patient not  taking: Reported on 09/13/2020 05/03/15   Arnoldo Lenis, MD  metoprolol succinate (TOPROL-XL) 50 MG 24 hr tablet TAKE 1 TABLET BY MOUTH ONCE DAILY WITH  OR  IMMEDIATELY  FOLLOWING  A  MEAL  (TAKE  WITH  100  MG  DOSE  FOR  A  TOTAL  OF  150  MG  DAILY) Patient not taking: Reported on 09/13/2020 11/15/18   Arnoldo Lenis, MD  ranitidine (ZANTAC) 150 MG capsule Take 150 mg by mouth 2 (two) times daily. Patient not taking: Reported on 09/13/2020    [provider]  XARELTO 20 MG TABS tablet TAKE 1 TABLET BY MOUTH ONCE DAILY WITH SUPPER Patient  not taking: Reported on 09/13/2020 07/05/18   Arnoldo Lenis, MD    Current Facility-Administered Medications  Medication Dose Route Frequency Provider Last Rate Last Admin   0.9 %  sodium chloride infusion  250 mL Intravenous PRN Agbata, Tochukwu, MD       acetaminophen (TYLENOL) tablet 650 mg  650 mg Oral Q6H PRN Agbata, Tochukwu, MD   650 mg at 09/14/20 0018   Or   acetaminophen (TYLENOL) suppository 650 mg  650 mg Rectal Q6H PRN Agbata, Tochukwu, MD       atorvastatin (LIPITOR) tablet 40 mg  40 mg Oral q1800 Agbata, Tochukwu, MD       colchicine tablet 0.6 mg  0.6 mg Oral BID Agbata, Tochukwu, MD   0.6 mg at 09/14/20 0902   insulin aspart (novoLOG) injection 0-15 Units  0-15 Units Subcutaneous TID WC Agbata, Tochukwu, MD       lisinopril (ZESTRIL) tablet 10 mg  10 mg Oral Daily Tat, David, MD   10 mg at 09/14/20 0902   metoprolol tartrate (LOPRESSOR) tablet 25 mg  25 mg Oral BID Agbata, Tochukwu, MD   25 mg at 09/14/20 0900   ondansetron (ZOFRAN) tablet 4 mg  4 mg Oral Q6H PRN Agbata, Tochukwu, MD       Or   ondansetron (ZOFRAN) injection 4 mg  4 mg Intravenous Q6H PRN Agbata, Tochukwu, MD       pantoprazole (PROTONIX) injection 40 mg  40 mg Intravenous Therisa Doyne, MD   40 mg at 09/14/20 [id number]   sodium chloride flush (NS) 0.9 % injection 3 mL  3 mL Intravenous Q12H Agbata, Tochukwu, MD   3 mL at 09/14/20 [id number]   sodium chloride flush (NS) 0.9 % injection 3 mL  3 mL Intravenous PRN Agbata, Tochukwu, MD        Allergies as of 09/13/2020   (No Known Allergies)    Family History  Problem Relation Age of Onset   Heart attack Mother    Hypertension Brother    Diabetes Brother    Cancer Father    Colon cancer Neg Hx     Social History   Socioeconomic History   Marital status: Single    Spouse name: Not on file   Number of children: Not on file   Years of education: Not on file   Highest education level: Not on file  Occupational History   Occupation: Unemployed,  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  Tobacco Use   Smoking status: Never   Smokeless tobacco: Never  Vaping Use   Vaping Use: Never used  Substance and Sexual Activity   Alcohol use: Yes    Alcohol/week: 3.0 standard drinks    Types: 3 Cans of beer per week    Comment: once a week  Drug use: No   Sexual activity: Not Currently  Other Topics Concern   Not on file  Social History Narrative   Lives alone, 2 daughters   No regular exercise   Social Determinants of Health   Financial Resource Strain: Not on file  Food Insecurity: Not on file  Transportation Needs: Not on file  Physical Activity: Not on file  Stress: Not on file  Social Connections: Not on file  Intimate Partner Violence: Not on file    Review of Systems: General: Negative for anorexia, weight loss, fever, chills, fatigue, weakness. Eyes: Negative for vision changes.  ENT: Negative for hoarseness, difficulty swallowing , nasal congestion. CV: Negative for chest pain, angina, palpitations, dyspnea on exertion, notes peripheral edema.  Respiratory: Negative for dyspnea at rest, dyspnea on exertion, cough, sputum, wheezing.  GI: See history of present illness. GU:  Negative for dysuria, hematuria, urinary incontinence, urinary frequency, nocturnal urination.  MS: Negative for joint pain, low back pain.  Derm: Negative for rash or itching.  Neuro: Negative for weakness, abnormal sensation, seizure, frequent headaches, memory loss, confusion.  Psych: Negative for anxiety, depression Endo: Negative for unusual weight change.  Heme: Negative for bruising or bleeding. Allergy: Negative for rash or hives.  Physical Exam: Vital signs in last 24 hours: Temp:  [98.4 F (36.9 C)-99.3 F (37.4 C)] 98.4 F (36.9 C) (07/29 2353) Pulse Rate:  [64-78] 67 (07/29 2353) Resp:  [16-20] 16 (07/29 2200) BP: (119-141)/(72-98) 121/77 (07/29 2353) SpO2:  [93 %-99 %] 97 % (07/29 2353) FiO2 (%):  [21 %] 21 % (07/29  2015) Weight:  [117.9 kg] 117.9 kg (07/29 1947)   General:   Alert,  Well-developed, well-nourished, pleasant and cooperative in NAD Head:  Normocephalic and atraumatic. Eyes:  Sclera clear, no icterus.   Conjunctiva pink. Ears:  Normal auditory acuity. Nose:  No deformity, discharge,  or lesions. Mouth:  No deformity or lesions, dentition normal. Neck:  Supple; no masses or thyromegaly. Lungs:  Clear throughout to auscultation.   No wheezes, crackles, or rhonchi. No acute distress. Heart:  Regular rate and rhythm; no murmurs, clicks, rubs,  or gallops. Abdomen:  Soft, nontender and nondistended. No masses, hepatosplenomegaly or hernias noted. Normal bowel sounds, without guarding, and without rebound.   Msk:  Symmetrical without gross deformities. Normal posture. Pulses:  Normal pulses noted. Extremities: Trace edema bilateral lower extremities Neurologic:  Alert and  oriented x4;  grossly normal neurologically. Skin:  Intact without significant lesions or rashes. Cervical Nodes:  No significant cervical adenopathy. Psych:  Alert and cooperative. Normal mood and affect.  Intake/Output from previous day: 07/29 0701 - 07/30 0700 In: 250 [P.O.:240; I.V.:10] Out: 150 [Urine:150] Intake/Output this shift: Total I/O In: 480 [P.O.:480] Out: -   Lab Results: Recent Labs    09/13/20 1213  WBC 5.4  HGB 7.3*  HCT 24.1*  PLT 231   BMET Recent Labs    09/13/20 1213  NA 142  K 3.6  CL 105  CO2 31  GLUCOSE 102*  BUN 23  CREATININE 1.20  CALCIUM 8.6*   LFT Recent Labs    09/13/20 1412  PROT 6.1*  ALBUMIN 3.2*  AST 19  ALT 13  ALKPHOS 61  BILITOT 0.7  BILIDIR 0.2  IBILI 0.5   PT/INR No results for input(s): LABPROT, INR in the last 72 hours. Hepatitis Panel No results for input(s): HEPBSAG, HCVAB, HEPAIGM, HEPBIGM in the last 72 hours. C-Diff No results for input(s): CDIFFTOX in the last  72 hours.  Studies/Results: DG Chest 2 View  Result Date:  09/13/2020 CLINICAL DATA:  Leg swelling. EXAM: CHEST - 2 VIEW COMPARISON:  11/09/2016 FINDINGS: Cardiac enlargement. No pleural effusion or edema. No airspace opacities identified. Thoracic spondylosis noted. IMPRESSION: 1. Cardiac enlargement. 2. No heart failure. Electronically Signed   By: Kerby Moors M.D.   On: 09/13/2020 12:38    Impression: *GI bleeding-source unclear *Acute blood loss anemia due to above *NSAID use *Family history of colon cancer in father and uncle *Chronic systemic anticoagulation  Plan: Discussed with patient his anemia in depth today.  Given his associated GI bleeding as well as chronic systemic anticoagulation, would recommend he undergo further evaluation with EGD and colonoscopy.  Upper differential including esophagitis, gastritis, duodenitis, peptic ulcer disease, AVMs, D[place] lesion, malignancy, or other.  Lower differential including hemorrhoids, diverticular, AVMs, polyps, malignancy, or other.  We will prep his colon tonight anticipation of performing both procedures tomorrow morning.  Continue on clear liquids today.    Continue to monitor H&H and transfuse for less than 7.  Hold Eliquis  Continue on IV Protonix twice daily.  Further recommendations to follow.  Elon Alas. Abbey Chatters, D.O. Gastroenterology and Hepatology [facility]    LOS: 0 days     09/14/2020, 11:05 AM

## 2020-09-14 NOTE — Progress Notes (Signed)
  Echocardiogram 2D Echocardiogram has been performed.  Dennis Swanson 09/14/2020, 3:38 PM

## 2020-09-14 NOTE — Progress Notes (Signed)
One small, soft, brown bm this morning. Has been ambulating to bathroom and sitting on bench in room today.  Started drinking bowel prep, consent signed for EGD, colonoscopy.

## 2020-09-14 NOTE — Progress Notes (Signed)
PROGRESS NOTE  Dennis Swanson [id number] DOB: Nov 13, 1955 DOA: 09/13/2020 PCP: Pcp, No  Brief History:  65 year old male with a history of diabetes mellitus type 2 ,diastolic CHF, atrial fibrillation previously on rivaroxaban, hypertension, hyperlipidemia, systolic and diastolic CHF presenting with a chief complaint of lower extremity edema.  The patient states that he was just recently released from incarceration on 08/26/2020, but has noticed increasing lower extremity edema over the past month even while he was incarcerated.  He denied any fevers, chills, chest pain, shortness of breath, cough, hemoptysis.  He states that he has had some infrequent episodes of dyspnea on exertion, but is able to do most of his activities of daily living without any shortness of breath.  The patient states that he was receiving colchicine and some other type of medicine for his gout during his incarceration.  He states that he has been using Aleve about 2-3 times per week since he has been released.  He denies any abdominal pain, but endorses some hematochezia intermittently over the past 2 weeks.  He denies any melena, hematemesis, dysuria, hematuria.  He denies any orthopnea, increasing abdominal girth, PND.  Review of his medication shows that the patient was receiving apixaban 2.5 mg once daily, furosemide 40 mg twice daily, furosemide 40 mg twice daily, lisinopril 40 mg daily during his incarceration.  Assessment/Plan: Hematochezia/symptomatic anemia -GI consulted -Clear liquid diet for now -Continue Protonix--increased to twice daily -previous baseline Hgb 14 -presented with Hgb 7.3 -iron saturation 3 -check ferritin  Chronic systolic and diastolic CHF -[id number] Echo EF 40-45% -05/06/2015 echo EF 60 to 65%, grade 1 DD -Appears clinically euvolemic -Continue oral furosemide  Leg edema -Venous duplex -Urine protein creatinine ratio -Echocardiogram  Atrial fibrillation, type  unspecified -Holding apixaban -Rate control presently -Continue metoprolol  Diabetes mellitus type 2 -Previously on metformin -Check hemoglobin A1c -NovoLog sliding scale  CKD stage IIIa -Baseline creatinine 1.2-1.4 -Monitor serially  Essential hypertension -Continue lisinopril and metoprolol  Hyperlipidemia -continue statin  Class 2 Obesity -BMI 35.26 -lifestyle modification      Status is: Observation  The patient will require care spanning > 2 midnights and should be moved to inpatient because: IV treatments appropriate due to intensity of illness or inability to take PO  Dispo: The patient is from: Home              Anticipated d/c is to: Home              Patient currently is not medically stable to d/c.   Difficult to place patient No        Family Communication:  no Family at bedside  Consultants:  GI  Code Status:  FULL   DVT Prophylaxis:  SCDs   Procedures: As Listed in Progress Note Above  Antibiotics: None        Subjective: Patient denies fevers, chills, headache, chest pain, dyspnea, nausea, vomiting, diarrhea, abdominal pain, dysuria, hematuria, hematochezia, and melena.   Objective: Vitals:   09/13/20 2137 09/13/20 2200 09/13/20 2300 09/13/20 2353  BP: (!) 141/98 133/78 119/76 121/77  Pulse: 76 76 64 67  Resp:  16    Temp:    98.4 F (36.9 C)  TempSrc:    Oral  SpO2:  95% 99% 97%  Weight:      Height:        Intake/Output Summary (Last 24 hours) at 09/14/2020 0844 Last data filed at 09/13/2020 2206 Gross  per 24 hour  Intake 250 ml  Output 150 ml  Net 100 ml   Weight change:  Exam:  General:  Pt is alert, follows commands appropriately, not in acute distress HEENT: No icterus, No thrush, No neck mass, [place]/AT Cardiovascular: RRR, S1/S2, no rubs, no gallops Respiratory: bibasilar rales. No wheeze Abdomen: Soft/+BS, non tender, non distended, no guarding Extremities: trace LE edema, No lymphangitis, No petechiae,  No rashes, no synovitis   Data Reviewed: I have personally reviewed following labs and imaging studies Basic Metabolic Panel: Recent Labs  Lab 09/13/20 1213  NA 142  K 3.6  CL 105  CO2 31  GLUCOSE 102*  BUN 23  CREATININE 1.20  CALCIUM 8.6*   Liver Function Tests: Recent Labs  Lab 09/13/20 1412  AST 19  ALT 13  ALKPHOS 61  BILITOT 0.7  PROT 6.1*  ALBUMIN 3.2*   No results for input(s): LIPASE, AMYLASE in the last 168 hours. No results for input(s): AMMONIA in the last 168 hours. Coagulation Profile: No results for input(s): INR, PROTIME in the last 168 hours. CBC: Recent Labs  Lab 09/13/20 1213  WBC 5.4  HGB 7.3*  HCT 24.1*  MCV 97.6  PLT 231   Cardiac Enzymes: No results for input(s): CKTOTAL, CKMB, CKMBINDEX, TROPONINI in the last 168 hours. BNP: Invalid input(s): POCBNP CBG: Recent Labs  Lab 09/13/20 2332 09/14/20 0016 09/14/20 0732  GLUCAP 85 78 88   HbA1C: No results for input(s): HGBA1C in the last 72 hours. Urine analysis:    Component Value Date/Time   COLORURINE YELLOW 09/13/2020 1603   APPEARANCEUR CLEAR 09/13/2020 1603   LABSPEC 1.024 09/13/2020 1603   PHURINE 6.0 09/13/2020 1603   GLUCOSEU NEGATIVE 09/13/2020 1603   HGBUR NEGATIVE 09/13/2020 1603   BILIRUBINUR NEGATIVE 09/13/2020 1603   KETONESUR NEGATIVE 09/13/2020 1603   PROTEINUR >=300 (A) 09/13/2020 1603   UROBILINOGEN 0.2 08/20/2009 1446   NITRITE NEGATIVE 09/13/2020 1603   LEUKOCYTESUR NEGATIVE 09/13/2020 1603   Sepsis Labs: '@LABRCNTIP'$ (procalcitonin:4,lacticidven:4) )No results found for this or any previous visit (from the past 240 hour(s)).   Scheduled Meds:  atorvastatin  40 mg Oral q1800   colchicine  0.6 mg Oral BID   insulin aspart  0-15 Units Subcutaneous TID WC   lisinopril  40 mg Oral Daily   metoprolol tartrate  25 mg Oral BID   pantoprazole (PROTONIX) IV  40 mg Intravenous Q24H   sodium chloride flush  3 mL Intravenous Q12H   Continuous Infusions:   sodium chloride      Procedures/Studies: DG Chest 2 View  Result Date: 09/13/2020 CLINICAL DATA:  Leg swelling. EXAM: CHEST - 2 VIEW COMPARISON:  11/09/2016 FINDINGS: Cardiac enlargement. No pleural effusion or edema. No airspace opacities identified. Thoracic spondylosis noted. IMPRESSION: 1. Cardiac enlargement. 2. No heart failure. Electronically Signed   By: Kerby Moors M.D.   On: 09/13/2020 12:38    Orson Eva, DO  [facility]  If 7PM-7AM, please contact night-coverage [email or web address] Password [email or web address] 09/14/2020, 8:44 AM   LOS: 0 days

## 2020-09-14 NOTE — Plan of Care (Signed)
Pt admitted for anemia. BP 121/77   Pulse 67   Temp 98.4 F (36.9 C) (Oral)   Resp 16   Ht 6' (1.829 m)   Wt 117.9 kg   SpO2 97%   BMI 35.26 kg/m  Pt is asymptomatic of anemia physically. Blood sugar of 78 pt encouraged  po intake of apple juice. Pt on tele, verified with NT. C/o shoulder pain tylenol given PO. Pt oriented to room, call bell near by, bed in lowest position with bed alarm on. No questions at this time. No skin issues, pt A&OX4, no iv fluids at this time. Louanne Skye, 09/14/20 12:27 AM

## 2020-09-14 NOTE — H&P (View-Only) (Signed)
Consulting  Provider: Dr. Francine Graven Primary Care Physician:  Pcp, No Primary Gastroenterologist: Previously Dr. Oneida Alar, now Dr. Abbey Chatters  Reason for Consultation: GI bleeding, acute blood loss anemia  HPI:  Dennis Swanson is a 66 y.o. male with a past medical history of CHF, chronic atrial fibrillation on Eliquis, who presented to [facility], ER for worsening bilateral lower extremity edema.  Patient states he was incarcerated for 6 months and during that time he noticed worsening swelling in his lower extremities.  States there was some issues with him receiving all of his medications though he did continually receive his Eliquis for chronic A. fib.  Work-up in the ER revealed a hemoglobin of 7.3 down from 14.43 years ago.  Has noted both melena and hematochezia.  States his stools have been at times black but has also had bright red blood per rectum as well.  No abdominal pain or discomfort.  Does note taking Aleve regularly since his release from prison.  No dysphagia odynophagia.  Last colonoscopy 2014 with 1 small rectal polyp removed, benign pathology.  Does note family history of colon cancer in his dad at age 17 as well as his uncle.  No unintentional weight loss.  No previous upper endoscopy.  Past Medical History:  Diagnosis Date   Acute pulmonary embolism ([place]) 2011   Alcohol abuse    Atrial flutter ([facility])    CHF (congestive heart failure) ([place])    Hypertension    Lower extremity cellulitis    bilateral   Systolic dysfunction     Past Surgical History:  Procedure Laterality Date   COLONOSCOPY N/A 05/17/2012   Procedure: COLONOSCOPY;  Surgeon: Danie Binder, MD;  Location: [facility];  Service: Endoscopy;  Laterality: N/A;  10:30   None      Prior to Admission medications   Medication Sig Start Date End Date Taking? Authorizing Provider  colchicine 0.6 MG tablet Take 0.6 mg by mouth 2 (two) times daily. 08/05/20  Yes [provider]  ELIQUIS 2.5 MG TABS tablet  Take 2.5 mg by mouth daily at 6 (six) AM. 08/05/20  Yes [provider]  furosemide (LASIX) 40 MG tablet TAKE ONE TABLET BY MOUTH IN THE MORNING AND ONE-HALF IN THE EVENING Patient taking differently: Take 40 mg by mouth 2 (two) times daily. 11/02/16  Yes Branch, Alphonse Guild, MD  hydrocortisone (ANUSOL-HC) 25 MG suppository Place 1 suppository (25 mg total) rectally 2 (two) times daily. 09/13/20  Yes Idol, Almyra Free, PA-C  lisinopril (PRINIVIL,ZESTRIL) 40 MG tablet Take 1 tablet (40 mg total) by mouth daily. 01/13/16  Yes Lendon Colonel, NP  omeprazole (PRILOSEC) 20 MG capsule Take 20 mg by mouth daily. 08/05/20  Yes [provider]  atorvastatin (LIPITOR) 40 MG tablet Take 1 tablet (40 mg total) by mouth daily at 6 PM. Patient not taking: Reported on 09/13/2020 01/13/16   Lendon Colonel, NP  CARTIA XT 240 MG 24 hr capsule TAKE ONE CAPSULE BY MOUTH ONCE DAILY Patient not taking: Reported on 09/13/2020 02/08/17   Arnoldo Lenis, MD  indomethacin (INDOCIN) 50 MG capsule Take by mouth. Patient not taking: Reported on 09/13/2020 06/18/20   [provider]  KLOR-CON 20 MEQ packet DISSOLVE & TAKE 1 POWDER PACKET BY MOUTH ONCE DAILY Patient not taking: Reported on 09/13/2020 03/03/19   Arnoldo Lenis, MD  metFORMIN (GLUCOPHAGE) 500 MG tablet Take 1 tablet (500 mg total) by mouth 2 (two) times daily with a meal. Patient not  taking: Reported on 09/13/2020 05/03/15   Arnoldo Lenis, MD  metoprolol succinate (TOPROL-XL) 50 MG 24 hr tablet TAKE 1 TABLET BY MOUTH ONCE DAILY WITH  OR  IMMEDIATELY  FOLLOWING  A  MEAL  (TAKE  WITH  100  MG  DOSE  FOR  A  TOTAL  OF  150  MG  DAILY) Patient not taking: Reported on 09/13/2020 11/15/18   Arnoldo Lenis, MD  ranitidine (ZANTAC) 150 MG capsule Take 150 mg by mouth 2 (two) times daily. Patient not taking: Reported on 09/13/2020    [provider]  XARELTO 20 MG TABS tablet TAKE 1 TABLET BY MOUTH ONCE DAILY WITH SUPPER Patient  not taking: Reported on 09/13/2020 07/05/18   Arnoldo Lenis, MD    Current Facility-Administered Medications  Medication Dose Route Frequency Provider Last Rate Last Admin   0.9 %  sodium chloride infusion  250 mL Intravenous PRN Agbata, Tochukwu, MD       acetaminophen (TYLENOL) tablet 650 mg  650 mg Oral Q6H PRN Agbata, Tochukwu, MD   650 mg at 09/14/20 0018   Or   acetaminophen (TYLENOL) suppository 650 mg  650 mg Rectal Q6H PRN Agbata, Tochukwu, MD       atorvastatin (LIPITOR) tablet 40 mg  40 mg Oral q1800 Agbata, Tochukwu, MD       colchicine tablet 0.6 mg  0.6 mg Oral BID Agbata, Tochukwu, MD   0.6 mg at 09/14/20 0902   insulin aspart (novoLOG) injection 0-15 Units  0-15 Units Subcutaneous TID WC Agbata, Tochukwu, MD       lisinopril (ZESTRIL) tablet 10 mg  10 mg Oral Daily Tat, David, MD   10 mg at 09/14/20 0902   metoprolol tartrate (LOPRESSOR) tablet 25 mg  25 mg Oral BID Agbata, Tochukwu, MD   25 mg at 09/14/20 0900   ondansetron (ZOFRAN) tablet 4 mg  4 mg Oral Q6H PRN Agbata, Tochukwu, MD       Or   ondansetron (ZOFRAN) injection 4 mg  4 mg Intravenous Q6H PRN Agbata, Tochukwu, MD       pantoprazole (PROTONIX) injection 40 mg  40 mg Intravenous Therisa Doyne, MD   40 mg at 09/14/20 [id number]   sodium chloride flush (NS) 0.9 % injection 3 mL  3 mL Intravenous Q12H Agbata, Tochukwu, MD   3 mL at 09/14/20 [id number]   sodium chloride flush (NS) 0.9 % injection 3 mL  3 mL Intravenous PRN Agbata, Tochukwu, MD        Allergies as of 09/13/2020   (No Known Allergies)    Family History  Problem Relation Age of Onset   Heart attack Mother    Hypertension Brother    Diabetes Brother    Cancer Father    Colon cancer Neg Hx     Social History   Socioeconomic History   Marital status: Single    Spouse name: Not on file   Number of children: Not on file   Years of education: Not on file   Highest education level: Not on file  Occupational History   Occupation: Unemployed,  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  Tobacco Use   Smoking status: Never   Smokeless tobacco: Never  Vaping Use   Vaping Use: Never used  Substance and Sexual Activity   Alcohol use: Yes    Alcohol/week: 3.0 standard drinks    Types: 3 Cans of beer per week    Comment: once a week  Drug use: No   Sexual activity: Not Currently  Other Topics Concern   Not on file  Social History Narrative   Lives alone, 2 daughters   No regular exercise   Social Determinants of Health   Financial Resource Strain: Not on file  Food Insecurity: Not on file  Transportation Needs: Not on file  Physical Activity: Not on file  Stress: Not on file  Social Connections: Not on file  Intimate Partner Violence: Not on file    Review of Systems: General: Negative for anorexia, weight loss, fever, chills, fatigue, weakness. Eyes: Negative for vision changes.  ENT: Negative for hoarseness, difficulty swallowing , nasal congestion. CV: Negative for chest pain, angina, palpitations, dyspnea on exertion, notes peripheral edema.  Respiratory: Negative for dyspnea at rest, dyspnea on exertion, cough, sputum, wheezing.  GI: See history of present illness. GU:  Negative for dysuria, hematuria, urinary incontinence, urinary frequency, nocturnal urination.  MS: Negative for joint pain, low back pain.  Derm: Negative for rash or itching.  Neuro: Negative for weakness, abnormal sensation, seizure, frequent headaches, memory loss, confusion.  Psych: Negative for anxiety, depression Endo: Negative for unusual weight change.  Heme: Negative for bruising or bleeding. Allergy: Negative for rash or hives.  Physical Exam: Vital signs in last 24 hours: Temp:  [98.4 F (36.9 C)-99.3 F (37.4 C)] 98.4 F (36.9 C) (07/29 2353) Pulse Rate:  [64-78] 67 (07/29 2353) Resp:  [16-20] 16 (07/29 2200) BP: (119-141)/(72-98) 121/77 (07/29 2353) SpO2:  [93 %-99 %] 97 % (07/29 2353) FiO2 (%):  [21 %] 21 % (07/29  2015) Weight:  [117.9 kg] 117.9 kg (07/29 1947)   General:   Alert,  Well-developed, well-nourished, pleasant and cooperative in NAD Head:  Normocephalic and atraumatic. Eyes:  Sclera clear, no icterus.   Conjunctiva pink. Ears:  Normal auditory acuity. Nose:  No deformity, discharge,  or lesions. Mouth:  No deformity or lesions, dentition normal. Neck:  Supple; no masses or thyromegaly. Lungs:  Clear throughout to auscultation.   No wheezes, crackles, or rhonchi. No acute distress. Heart:  Regular rate and rhythm; no murmurs, clicks, rubs,  or gallops. Abdomen:  Soft, nontender and nondistended. No masses, hepatosplenomegaly or hernias noted. Normal bowel sounds, without guarding, and without rebound.   Msk:  Symmetrical without gross deformities. Normal posture. Pulses:  Normal pulses noted. Extremities: Trace edema bilateral lower extremities Neurologic:  Alert and  oriented x4;  grossly normal neurologically. Skin:  Intact without significant lesions or rashes. Cervical Nodes:  No significant cervical adenopathy. Psych:  Alert and cooperative. Normal mood and affect.  Intake/Output from previous day: 07/29 0701 - 07/30 0700 In: 250 [P.O.:240; I.V.:10] Out: 150 [Urine:150] Intake/Output this shift: Total I/O In: 480 [P.O.:480] Out: -   Lab Results: Recent Labs    09/13/20 1213  WBC 5.4  HGB 7.3*  HCT 24.1*  PLT 231   BMET Recent Labs    09/13/20 1213  NA 142  K 3.6  CL 105  CO2 31  GLUCOSE 102*  BUN 23  CREATININE 1.20  CALCIUM 8.6*   LFT Recent Labs    09/13/20 1412  PROT 6.1*  ALBUMIN 3.2*  AST 19  ALT 13  ALKPHOS 61  BILITOT 0.7  BILIDIR 0.2  IBILI 0.5   PT/INR No results for input(s): LABPROT, INR in the last 72 hours. Hepatitis Panel No results for input(s): HEPBSAG, HCVAB, HEPAIGM, HEPBIGM in the last 72 hours. C-Diff No results for input(s): CDIFFTOX in the last  72 hours.  Studies/Results: DG Chest 2 View  Result Date:  09/13/2020 CLINICAL DATA:  Leg swelling. EXAM: CHEST - 2 VIEW COMPARISON:  11/09/2016 FINDINGS: Cardiac enlargement. No pleural effusion or edema. No airspace opacities identified. Thoracic spondylosis noted. IMPRESSION: 1. Cardiac enlargement. 2. No heart failure. Electronically Signed   By: Kerby Moors M.D.   On: 09/13/2020 12:38    Impression: *GI bleeding-source unclear *Acute blood loss anemia due to above *NSAID use *Family history of colon cancer in father and uncle *Chronic systemic anticoagulation  Plan: Discussed with patient his anemia in depth today.  Given his associated GI bleeding as well as chronic systemic anticoagulation, would recommend he undergo further evaluation with EGD and colonoscopy.  Upper differential including esophagitis, gastritis, duodenitis, peptic ulcer disease, AVMs, D[place] Foy lesion, malignancy, or other.  Lower differential including hemorrhoids, diverticular, AVMs, polyps, malignancy, or other.  We will prep his colon tonight anticipation of performing both procedures tomorrow morning.  Continue on clear liquids today.    Continue to monitor H&H and transfuse for less than 7.  Hold Eliquis  Continue on IV Protonix twice daily.  Further recommendations to follow.  Elon Alas. Abbey Chatters, D.O. Gastroenterology and Hepatology [facility]    LOS: 0 days     09/14/2020, 11:05 AM

## 2020-09-14 NOTE — Progress Notes (Signed)
   09/14/20 1120  Assess: MEWS Score  BP 115/78  Pulse Rate (!) 120  SpO2 95 %  O2 Device Room Air  Assess: MEWS Score  MEWS Temp 0  MEWS Systolic 0  MEWS Pulse 2  MEWS RR 0  MEWS LOC 0  MEWS Score 2  MEWS Score Color Yellow  Assess: if the MEWS score is Yellow or Red  Were vital signs taken at a resting state? No  Focused Assessment Change from prior assessment (see assessment flowsheet)  Early Detection of Sepsis Score *See Row Information* Low  MEWS guidelines implemented *See Row Information* Yes  Treat  MEWS Interventions Other (Comment) (none)  Pain Scale 0-10  Pain Score 0  Take Vital Signs  Increase Vital Sign Frequency  Yellow: Q 2hr X 2 then Q 4hr X 2, if remains yellow, continue Q 4hrs  Escalate  MEWS: Escalate Yellow: discuss with charge nurse/RN and consider discussing with provider and RRT  Notify: Charge Nurse/RN  Name of Charge Nurse/RN Notified Zenaida Deed  Date Charge Nurse/RN Notified 09/14/20  Time Charge Nurse/RN Notified 1130  Document  Patient Outcome Other (Comment) (low hgb.)  Progress note created (see row info) Yes

## 2020-09-15 ENCOUNTER — Inpatient Hospital Stay (HOSPITAL_COMMUNITY): Payer: Medicaid Other | Admitting: Anesthesiology

## 2020-09-15 ENCOUNTER — Encounter (HOSPITAL_COMMUNITY): Payer: Self-pay | Admitting: Internal Medicine

## 2020-09-15 ENCOUNTER — Encounter (HOSPITAL_COMMUNITY)
Admission: EM | Disposition: A | Discharge status: Home / Self Care | Payer: Self-pay | Source: Home / Self Care | Attending: Internal Medicine

## 2020-09-15 DIAGNOSIS — K648 Other hemorrhoids: Secondary | ICD-10-CM

## 2020-09-15 DIAGNOSIS — D649 Anemia, unspecified: Secondary | ICD-10-CM

## 2020-09-15 DIAGNOSIS — K5730 Diverticulosis of large intestine without perforation or abscess without bleeding: Secondary | ICD-10-CM

## 2020-09-15 DIAGNOSIS — I10 Essential (primary) hypertension: Secondary | ICD-10-CM

## 2020-09-15 DIAGNOSIS — D62 Acute posthemorrhagic anemia: Secondary | ICD-10-CM

## 2020-09-15 DIAGNOSIS — K2971 Gastritis, unspecified, with bleeding: Secondary | ICD-10-CM

## 2020-09-15 DIAGNOSIS — Q438 Other specified congenital malformations of intestine: Secondary | ICD-10-CM

## 2020-09-15 DIAGNOSIS — K6289 Other specified diseases of anus and rectum: Secondary | ICD-10-CM

## 2020-09-15 DIAGNOSIS — N1831 Chronic kidney disease, stage 3a: Secondary | ICD-10-CM | POA: Diagnosis not present

## 2020-09-15 DIAGNOSIS — D500 Iron deficiency anemia secondary to blood loss (chronic): Secondary | ICD-10-CM | POA: Diagnosis not present

## 2020-09-15 DIAGNOSIS — I4891 Unspecified atrial fibrillation: Secondary | ICD-10-CM | POA: Diagnosis not present

## 2020-09-15 DIAGNOSIS — K2970 Gastritis, unspecified, without bleeding: Secondary | ICD-10-CM

## 2020-09-15 HISTORY — PX: COLONOSCOPY WITH PROPOFOL: SHX5780

## 2020-09-15 HISTORY — PX: ESOPHAGOGASTRODUODENOSCOPY (EGD) WITH PROPOFOL: SHX5813

## 2020-09-15 LAB — CBC
HCT: 25.5 % — ABNORMAL LOW (ref 39.0–52.0)
Hemoglobin: 7.5 g/dL — ABNORMAL LOW (ref 13.0–17.0)
MCH: 28.2 pg (ref 26.0–34.0)
MCHC: 29.4 g/dL — ABNORMAL LOW (ref 30.0–36.0)
MCV: 95.9 fL (ref 80.0–100.0)
Platelets: 268 10*3/uL (ref 150–400)
RBC: 2.66 MIL/uL — ABNORMAL LOW (ref 4.22–5.81)
RDW: 15.3 % (ref 11.5–15.5)
WBC: 6.1 10*3/uL (ref 4.0–10.5)
nRBC: 0 % (ref 0.0–0.2)

## 2020-09-15 LAB — GLUCOSE, CAPILLARY
Glucose-Capillary: 63 mg/dL — ABNORMAL LOW (ref 70–99)
Glucose-Capillary: 119 mg/dL — ABNORMAL HIGH (ref 70–99)
Glucose-Capillary: 80 mg/dL (ref 70–99)
Glucose-Capillary: 102 mg/dL — ABNORMAL HIGH (ref 70–99)
Glucose-Capillary: 65 mg/dL — ABNORMAL LOW (ref 70–99)
Glucose-Capillary: 60 mg/dL — ABNORMAL LOW (ref 70–99)
Glucose-Capillary: 76 mg/dL (ref 70–99)
Glucose-Capillary: 97 mg/dL (ref 70–99)

## 2020-09-15 LAB — BASIC METABOLIC PANEL
Anion gap: 8 (ref 5–15)
BUN: 24 mg/dL — ABNORMAL HIGH (ref 8–23)
CO2: 30 mmol/L (ref 22–32)
Calcium: 8.8 mg/dL — ABNORMAL LOW (ref 8.9–10.3)
Chloride: 102 mmol/L (ref 98–111)
Creatinine, Ser: 1.32 mg/dL — ABNORMAL HIGH (ref 0.61–1.24)
GFR, Estimated: 59 mL/min — ABNORMAL LOW (ref >60)
Glucose, Bld: 113 mg/dL — ABNORMAL HIGH (ref 70–99)
Potassium: 3.7 mmol/L (ref 3.5–5.1)
Sodium: 140 mmol/L (ref 135–145)

## 2020-09-15 SURGERY — ESOPHAGOGASTRODUODENOSCOPY (EGD) WITH PROPOFOL
Anesthesia: General

## 2020-09-15 MED ORDER — LACTATED RINGERS IV SOLN: INTRAVENOUS | Status: DC | PRN

## 2020-09-15 MED ORDER — PHENYLEPHRINE 40 MCG/ML (10ML) SYRINGE FOR IV PUSH (FOR BLOOD PRESSURE SUPPORT)
PREFILLED_SYRINGE | INTRAVENOUS | Status: AC
  Filled 2020-09-15: qty 10

## 2020-09-15 MED ORDER — LACTATED RINGERS IV SOLN: INTRAVENOUS | Status: DC

## 2020-09-15 MED ORDER — PROPOFOL 10 MG/ML IV BOLUS
INTRAVENOUS | Status: AC
  Filled 2020-09-15: qty 40

## 2020-09-15 MED ORDER — EPHEDRINE SULFATE 50 MG/ML IJ SOLN
INTRAMUSCULAR | Status: DC | PRN
  Administered 2020-09-15: 10 mg via INTRAVENOUS
  Administered 2020-09-15: 50 mg via INTRAVENOUS
  Administered 2020-09-15: 10 mg via INTRAVENOUS

## 2020-09-15 MED ORDER — LACTATED RINGERS IV BOLUS
1000.0000 mL | Freq: Once | INTRAVENOUS | Status: AC
  Administered 2020-09-15: 1000 mL via INTRAVENOUS

## 2020-09-15 MED ORDER — DEXTROSE 50 % IV SOLN
INTRAVENOUS | Status: AC
  Administered 2020-09-15: 25 mL
  Filled 2020-09-15: qty 50

## 2020-09-15 MED ORDER — STERILE WATER FOR IRRIGATION IR SOLN
Status: DC | PRN
  Administered 2020-09-15: 200 mL

## 2020-09-15 MED ORDER — POTASSIUM CHLORIDE CRYS ER 20 MEQ PO TBCR
20.0000 meq | EXTENDED_RELEASE_TABLET | Freq: Once | ORAL | Status: AC
  Administered 2020-09-15: 20 meq via ORAL
  Filled 2020-09-15: qty 1

## 2020-09-15 MED ORDER — DEXTROSE 50 % IV SOLN
12.5000 g | Freq: Once | INTRAVENOUS | Status: AC
  Administered 2020-09-15: 12.5 g via INTRAVENOUS

## 2020-09-15 MED ORDER — PROPOFOL 10 MG/ML IV BOLUS
INTRAVENOUS | Status: DC | PRN
  Administered 2020-09-15: 100 mg via INTRAVENOUS

## 2020-09-15 MED ORDER — GLYCOPYRROLATE 0.2 MG/ML IJ SOLN
INTRAMUSCULAR | Status: DC | PRN
  Administered 2020-09-15: .2 mg via INTRAVENOUS

## 2020-09-15 MED ORDER — DEXTROSE 50 % IV SOLN
INTRAVENOUS | Status: AC
  Filled 2020-09-15: qty 50

## 2020-09-15 MED ORDER — PROPOFOL 10 MG/ML IV BOLUS
INTRAVENOUS | Status: AC
  Filled 2020-09-15: qty 20

## 2020-09-15 MED ORDER — PHENYLEPHRINE HCL (PRESSORS) 10 MG/ML IV SOLN
INTRAVENOUS | Status: DC | PRN
  Administered 2020-09-15: 40 ug via INTRAVENOUS

## 2020-09-15 MED ORDER — MAGNESIUM SULFATE 2 GM/50ML IV SOLN
2.0000 g | Freq: Once | INTRAVENOUS | Status: AC
  Administered 2020-09-15: 2 g via INTRAVENOUS
  Filled 2020-09-15: qty 50

## 2020-09-15 MED ORDER — EPHEDRINE 5 MG/ML INJ
INTRAVENOUS | Status: AC
  Filled 2020-09-15: qty 5

## 2020-09-15 MED ORDER — SODIUM CHLORIDE 0.9 % IV SOLN
250.0000 mg | Freq: Every day | INTRAVENOUS | Status: AC
  Administered 2020-09-15 – 2020-09-16 (×2): 250 mg via INTRAVENOUS
  Filled 2020-09-15 (×2): qty 20

## 2020-09-15 NOTE — Anesthesia Postprocedure Evaluation (Signed)
Anesthesia Post Note  Patient: Dennis Swanson  Procedure(s) Performed: ESOPHAGOGASTRODUODENOSCOPY (EGD) WITH PROPOFOL COLONOSCOPY WITH PROPOFOL  Patient location during evaluation: PACU Anesthesia Type: General Level of consciousness: awake Pain management: pain level controlled Vital Signs Assessment: post-procedure vital signs reviewed and stable Respiratory status: spontaneous breathing and respiratory function stable Cardiovascular status: blood pressure returned to baseline and stable Postop Assessment: no headache and no apparent nausea or vomiting Anesthetic complications: no Comments: Pt awake, conversing, moving all extremities.   No notable events documented.   Last Vitals:  Vitals:   09/15/20 0518 09/15/20 1105  BP: 100/69 (!) 136/97  Pulse: (!) 57 65  Resp: 18 (!) 28  Temp: 36.7 C 36.6 C  SpO2: 92% 95%    Last Pain:  Vitals:   09/15/20 0739  TempSrc:   PainSc: 0-No pain                 Louann Sjogren

## 2020-09-15 NOTE — Op Note (Signed)
[facility] Patient Name: Dennis Swanson Procedure Date: 09/15/2020 9:28 AM MRN: [id number] Date of Birth: 1955-05-16 Attending MD: Elon Alas. Abbey Chatters DO CSN: [id number] Age: 65 Admit Type: Inpatient Procedure:                Upper GI endoscopy Indications:              Acute post hemorrhagic anemia Providers:                Elon Alas. Abbey Chatters, DO, Lurline Del, RN, Wynonia Musty Tech, Technician Referring MD:              Medicines:                See the Anesthesia note for documentation of the                            administered medications Complications:            Hypotension Estimated Blood Loss:     Estimated blood loss was minimal. Procedure:                Pre-Anesthesia Assessment:                           - The anesthesia plan was to use monitored                            anesthesia care (MAC).                           After obtaining informed consent, the endoscope was                            passed under direct vision. Throughout the                            procedure, the patient's blood pressure, pulse, and                            oxygen saturations were monitored continuously. The                            [id number]-[id number] ([id number]) scope was introduced through the                            mouth, and advanced to the second part of duodenum.                            The upper GI endoscopy was accomplished without                            difficulty. The patient tolerated the procedure                            poorly due to the patient's  cardiovascular                            instability. Scope In: 10:20:52 AM Scope Out: 10:28:16 AM Total Procedure Duration: 0 hours 7 minutes 24 seconds  Findings:      There is no endoscopic evidence of bleeding, areas of erosion,       esophagitis or ulcerations in the entire esophagus.      A small hiatal hernia was present.      Patchy moderate inflammation characterized by  erosions and erythema was       found in the gastric fundus and in the gastric body. Biopsies were taken       with a cold forceps for Helicobacter pylori testing.      The duodenal bulb, first portion of the duodenum and second portion of       the duodenum were normal.      Stomach would not fully insufflate despite laying on the CO2 for a few       minutes. Unable to fully evaluate fundus and part of the body of the       stomach.      During procedure, patient began to have a few runs of V. tach and       subsequently became bradycardic in the 40s and hypotensive. EGD was then       aborted. Patient was stabilized by anesthesia and colonoscopy was then       performed. Patient tolerated colonoscopy without any issues from a       cardiac or pulmonary perspective. Impression:               - Small hiatal hernia.                           - Gastritis. Biopsied.                           - Normal duodenal bulb, first portion of the                            duodenum and second portion of the duodenum. Moderate Sedation:      Per Anesthesia Care Recommendation:           - See colonoscopy report Procedure Code(s):        --- Professional ---                           [phone], Esophagogastroduodenoscopy, flexible,                            transoral; with biopsy, single or multiple Diagnosis Code(s):        --- Professional ---                           K44.9, Diaphragmatic hernia without obstruction or                            gangrene                           K29.70, Gastritis, unspecified, without bleeding  D62, Acute posthemorrhagic anemia CPT copyright 2019 American Medical Association. All rights reserved. The codes documented in this report are preliminary and upon coder review may  be revised to meet current compliance requirements. Elon Alas. Abbey Chatters, DO [place] Abbey Chatters, DO 09/15/2020 11:29:48 AM This report has been signed electronically. Number  of Addenda: 0

## 2020-09-15 NOTE — Progress Notes (Signed)
Dr. Carles Collet notified of patient's low B/P on return to the unit. IV bolus ordered.

## 2020-09-15 NOTE — Anesthesia Preprocedure Evaluation (Signed)
Anesthesia Evaluation  Patient identified by MRN, date of birth, ID band Patient awake    Reviewed: Allergy & Precautions, H&P , NPO status , Patient's Chart, lab work & pertinent test results, reviewed documented beta blocker date and time   Airway Mallampati: II  TM Distance: >3 FB Neck ROM: full    Dental no notable dental hx.    Pulmonary neg pulmonary ROS,    Pulmonary exam normal breath sounds clear to auscultation       Cardiovascular Exercise Tolerance: Good hypertension, +CHF   Rhythm:regular Rate:Normal     Neuro/Psych negative neurological ROS  negative psych ROS   GI/Hepatic negative GI ROS, (+)     substance abuse  alcohol use,   Endo/Other  negative endocrine ROS  Renal/GU CRFRenal disease  negative genitourinary   Musculoskeletal negative musculoskeletal ROS (+)   Abdominal   Peds negative pediatric ROS (+)  Hematology  (+) Blood dyscrasia, anemia ,   Anesthesia Other Findings 1. Right ventricular systolic function is severely reduced. The right  ventricular size is severely enlarged. There is moderately elevated  pulmonary artery systolic pressure. The estimated right ventricular  systolic pressure is 123XX123 mmHg.  2. 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  3. Left ventricular ejection fraction, by estimation, is 45 to 50%. The  left ventricle has low normal function. The left ventricle demonstrates  global hypokinesis. There is moderate left ventricular hypertrophy. Left  ventricular diastolic parameters are  indeterminate. There is the interventricular septum is flattened in  systole and diastole, consistent with right ventricular pressure and  volume overload.  4. Left atrial size was severely dilated.  5. Right atrial size was severely dilated.  6. The mitral valve is grossly normal. Mild mitral valve regurgitation.  No evidence of mitral  stenosis.  7. The aortic valve is tricuspid. There is mild calcification of the  aortic valve. Aortic valve regurgitation is trivial. No aortic stenosis is  present.  8. Aortic dilatation noted. There is mild dilatation of the ascending  aorta, measuring 40 mm.  9. The inferior vena cava is dilated in size with <50% respiratory  variability, suggesting right atrial pressure of 15 mmHg.  10. Cannot exclude small PFO.   Reproductive/Obstetrics negative OB ROS                             Anesthesia Physical Anesthesia Plan  ASA: 3 and emergent  Anesthesia Plan: General   Post-op Pain Management:    Induction:   PONV Risk Score and Plan: Propofol infusion  Airway Management Planned:   Additional Equipment:   Intra-op Plan:   Post-operative Plan:   Informed Consent: I have reviewed the patients History and Physical, chart, labs and discussed the procedure including the risks, benefits and alternatives for the proposed anesthesia with the patient or authorized representative who has indicated his/her understanding and acceptance.     Dental Advisory Given  Plan Discussed with: CRNA  Anesthesia Plan Comments:         Anesthesia Quick Evaluation

## 2020-09-15 NOTE — Consult Note (Addendum)
[facility] Consult  Reason for Consult: Anal mass  Referring Physician:  Dr. Abbey Chatters   Chief Complaint   Leg Swelling     HPI: Dennis Swanson is a 65 y.o. male with anemia on admission and presumed GI bleed who was getting an EGD and colonoscopy today to assess for a source. I was called intraprocedure for a anal mass noted on Dr. Ave Filter exam. The patient was also found to have gastritis but no other sources for blood loss. He is on chronic anticoagulation for A fib with eliquis and has CHF.  He also reportedly ahs bilateral leg swelling and this is the reason for his visit to the ED.   I talked with the patient after the procedure and he reports having melena and hematochezia. He takes Aleve regularly.  He had a colonoscopy in 2014 with a  polyp and had colon cancer in his father and uncle per report.   He says he has a right inguinal hernia that has been there for years and causes him some discomfort at times but has never been stuck out or hard.  During his procedure today he had a pretty significant bradycardia but recovered with support.   Past Medical History:  Diagnosis Date   Acute pulmonary embolism ([place]) 2011   Alcohol abuse    Atrial flutter (H[facility])    CHF (congestive heart failure) ([place])    Hypertension    Lower extremity cellulitis    bilateral   Systolic dysfunction     Past Surgical History:  Procedure Laterality Date   COLONOSCOPY N/A 05/17/2012   Procedure: COLONOSCOPY;  Surgeon: Danie Binder, MD;  Location: [facility];  Service: Endoscopy;  Laterality: N/A;  10:30   None      Family History  Problem Relation Age of Onset   Heart attack Mother    Hypertension Brother    Diabetes Brother    Cancer Father    Colon cancer Neg Hx     Social History   Tobacco Use   Smoking status: Never   Smokeless tobacco: Never  Vaping Use   Vaping Use: Never used  Substance Use Topics   Alcohol use: Yes    Alcohol/week: 3.0 standard  drinks    Types: 3 Cans of beer per week    Comment: once a week   Drug use: No    Medications: I have reviewed the patient's current medications. Prior to Admission:  Medications Prior to Admission  Medication Sig Dispense Refill Last Dose   colchicine 0.6 MG tablet Take 0.6 mg by mouth 2 (two) times daily.   Past Week   ELIQUIS 2.5 MG TABS tablet Take 2.5 mg by mouth daily at 6 (six) AM.   09/11/2020 at 0800   furosemide (LASIX) 40 MG tablet TAKE ONE TABLET BY MOUTH IN THE MORNING AND ONE-HALF IN THE EVENING (Patient taking differently: Take 40 mg by mouth 2 (two) times daily.) 135 tablet 3 Past Week   lisinopril (PRINIVIL,ZESTRIL) 40 MG tablet Take 1 tablet (40 mg total) by mouth daily. 30 tablet 11 Past Week   omeprazole (PRILOSEC) 20 MG capsule Take 20 mg by mouth daily.   09/13/2020   atorvastatin (LIPITOR) 40 MG tablet Take 1 tablet (40 mg total) by mouth daily at 6 PM. (Patient not taking: Reported on 09/13/2020) 30 tablet 11 Not Taking   CARTIA XT 240 MG 24 hr capsule TAKE ONE CAPSULE BY MOUTH ONCE DAILY (Patient not taking: Reported on  09/13/2020) 90 capsule 0 Not Taking   indomethacin (INDOCIN) 50 MG capsule Take by mouth. (Patient not taking: Reported on 09/13/2020)   Not Taking   KLOR-CON 20 MEQ packet DISSOLVE & TAKE 1 POWDER PACKET BY MOUTH ONCE DAILY (Patient not taking: Reported on 09/13/2020) 90 each 3 Not Taking   metFORMIN (GLUCOPHAGE) 500 MG tablet Take 1 tablet (500 mg total) by mouth 2 (two) times daily with a meal. (Patient not taking: Reported on 09/13/2020) 180 tablet 3 Not Taking   metoprolol succinate (TOPROL-XL) 50 MG 24 hr tablet TAKE 1 TABLET BY MOUTH ONCE DAILY WITH  OR  IMMEDIATELY  FOLLOWING  A  MEAL  (TAKE  WITH  100  MG  DOSE  FOR  A  TOTAL  OF  150  MG  DAILY) (Patient not taking: Reported on 09/13/2020) 30 tablet 0 Not Taking   ranitidine (ZANTAC) 150 MG capsule Take 150 mg by mouth 2 (two) times daily. (Patient not taking: Reported on 09/13/2020)   Not Taking    XARELTO 20 MG TABS tablet TAKE 1 TABLET BY MOUTH ONCE DAILY WITH SUPPER (Patient not taking: Reported on 09/13/2020) 30 tablet 6 Not Taking   Scheduled:  [MAR Hold] atorvastatin  40 mg Oral q1800   [MAR Hold] colchicine  0.6 mg Oral BID   [MAR Hold] insulin aspart  0-15 Units Subcutaneous TID WC   [MAR Hold] lisinopril  10 mg Oral Daily   [MAR Hold] metoprolol tartrate  25 mg Oral BID   [MAR Hold] pantoprazole (PROTONIX) IV  40 mg Intravenous Q12H   [MAR Hold] sodium chloride flush  3 mL Intravenous Q12H   Continuous:  [MAR Hold] sodium chloride     lactated ringers     PRN:[MAR Hold] sodium chloride, [MAR Hold] acetaminophen **OR** [MAR Hold] acetaminophen, [MAR Hold] ondansetron **OR** [MAR Hold] ondansetron (ZOFRAN) IV, [MAR Hold] sodium chloride flush  No Known Allergies   ROS:  A comprehensive review of systems was negative except for: Gastrointestinal: positive for melena and right inguinal hernia  Blood pressure 91/62, pulse (!) 58, temperature 97.9 F (36.6 C), resp. rate (!) 23, height 6' (1.829 m), weight 117.9 kg, SpO2 94 %. Physical Exam Vitals reviewed.  HENT:     Head: Normocephalic.     Nose: Nose normal.     Mouth/Throat:     Mouth: Mucous membranes are moist.  Eyes:     Pupils: Pupils are equal, round, and reactive to light.  Cardiovascular:     Rate and Rhythm: Normal rate.  Pulmonary:     Effort: Pulmonary effort is normal.  Abdominal:     General: There is no distension.     Palpations: Abdomen is soft.     Tenderness: There is no abdominal tenderness.     Hernia: A hernia is present. Hernia is present in the right inguinal area.  Genitourinary:    Comments: Anal mass with protrusion from the anal  verge, biopsied by Dr. Abbey Chatters during colonoscopy, rectal without any hard or indurated areas further in, warty appearing mass Musculoskeletal:        General: Swelling present.     Cervical back: Normal range of motion.  Lymphadenopathy:     Lower Body:  No right inguinal adenopathy. No left inguinal adenopathy.  Neurological:     General: No focal deficit present.     Mental Status: He is alert.  Psychiatric:        Mood and Affect: Mood normal.  Behavior: Behavior normal.    Results: Results for orders placed or performed during the hospital encounter of 09/13/20 (from the past 48 hour(s))  Basic metabolic panel     Status: Abnormal   Collection Time: 09/13/20 12:13 PM  Result Value Ref Range   Sodium 142 135 - 145 mmol/L   Potassium 3.6 3.5 - 5.1 mmol/L   Chloride 105 98 - 111 mmol/L   CO2 31 22 - 32 mmol/L   Glucose, Bld 102 (H) 70 - 99 mg/dL    Comment: Glucose reference range applies only to samples taken after fasting for at least 8 hours.   BUN 23 8 - 23 mg/dL   Creatinine, Ser 1.20 0.61 - 1.24 mg/dL   Calcium 8.6 (L) 8.9 - 10.3 mg/dL   GFR, Estimated >60 >60 mL/min    Comment: (NOTE) Calculated using the CKD-EPI Creatinine Equation (2021)    Anion gap 6 5 - 15    Comment: Performed at [facility], [address], [place], [place] [address]  CBC     Status: Abnormal   Collection Time: 09/13/20 12:13 PM  Result Value Ref Range   WBC 5.4 4.0 - 10.5 K/uL   RBC 2.47 (L) 4.22 - 5.81 MIL/uL   Hemoglobin 7.3 (L) 13.0 - 17.0 g/dL   HCT 24.1 (L) 39.0 - 52.0 %   MCV 97.6 80.0 - 100.0 fL   MCH 29.6 26.0 - 34.0 pg   MCHC 30.3 30.0 - 36.0 g/dL   RDW 15.4 11.5 - 15.5 %   Platelets 231 150 - 400 K/uL   nRBC 0.0 0.0 - 0.2 %    Comment: Performed at [facility], [address] [place], [place] [address]  Troponin I (High Sensitivity)     Status: Abnormal   Collection Time: 09/13/20 12:13 PM  Result Value Ref Range   Troponin I (High Sensitivity) 27 (H) <18 ng/L    Comment: (NOTE) Elevated high sensitivity troponin I (hsTnI) values and significant  changes across serial measurements may suggest ACS but many other  chronic and acute conditions are known to elevate hsTnI results.  Refer to the "Links" section for chest  pain algorithms and additional  guidance. Performed at [facility], [address] [place], [place] [address]   ABO/Rh     Status: None   Collection Time: 09/13/20 12:13 PM  Result Value Ref Range   ABO/RH(D)      A POS Performed at [facility], [address] [place], [place] [address]   Troponin I (High Sensitivity)     Status: Abnormal   Collection Time: 09/13/20  2:12 PM  Result Value Ref Range   Troponin I (High Sensitivity) 28 (H) <18 ng/L    Comment: (NOTE) Elevated high sensitivity troponin I (hsTnI) values and significant  changes across serial measurements may suggest ACS but many other  chronic and acute conditions are known to elevate hsTnI results.  Refer to the "Links" section for chest pain algorithms and additional  guidance. Performed at [facility], [address] [place], [place] [address]   Hepatic function panel     Status: Abnormal   Collection Time: 09/13/20  2:12 PM  Result Value Ref Range   Total Protein 6.1 (L) 6.5 - 8.1 g/dL   Albumin 3.2 (L) 3.5 - 5.0 g/dL   AST 19 15 - 41 U/L   ALT 13 0 - 44 U/L   Alkaline Phosphatase 61 38 - 126 U/L   Total Bilirubin 0.7  0.3 - 1.2 mg/dL   Bilirubin, Direct 0.2 0.0 - 0.2 mg/dL   Indirect Bilirubin 0.5 0.3 - 0.9 mg/dL    Comment: Performed at [facility], [address], [place], [place] [address]  Urinalysis, Routine w reflex microscopic Urine, Clean Catch     Status: Abnormal   Collection Time: 09/13/20  4:03 PM  Result Value Ref Range   Color, Urine YELLOW YELLOW   APPearance CLEAR CLEAR   Specific Gravity, Urine 1.024 1.005 - 1.030   pH 6.0 5.0 - 8.0   Glucose, UA NEGATIVE NEGATIVE mg/dL   Hgb urine dipstick NEGATIVE NEGATIVE   Bilirubin Urine NEGATIVE NEGATIVE   Ketones, ur NEGATIVE NEGATIVE mg/dL   Protein, ur >=300 (A) NEGATIVE mg/dL   Nitrite NEGATIVE NEGATIVE   Leukocytes,Ua NEGATIVE NEGATIVE   RBC / HPF 0-5 0 - 5 RBC/hpf   WBC, UA 0-5 0 - 5 WBC/hpf   Bacteria, UA NONE SEEN NONE SEEN    Squamous Epithelial / LPF 0-5 0 - 5   Mucus PRESENT     Comment: Performed at [facility], [address], [place], [place] [address]  POC occult blood, ED RN will collect     Status: Abnormal   Collection Time: 09/13/20  7:02 PM  Result Value Ref Range   Fecal Occult Bld POSITIVE (A) NEGATIVE  Iron and TIBC     Status: Abnormal   Collection Time: 09/13/20  9:18 PM  Result Value Ref Range   Iron 11 (L) 45 - 182 ug/dL   TIBC 386 250 - 450 ug/dL   Saturation Ratios 3 (L) 17.9 - 39.5 %   UIBC 375 ug/dL    Comment: Performed at [facility], [address], [place], [place] [address]  Hemoglobin A1c     Status: None   Collection Time: 09/13/20  9:19 PM  Result Value Ref Range   Hgb A1c MFr Bld 5.5 4.8 - 5.6 %    Comment: (NOTE) Pre diabetes:          5.7%-6.4%  Diabetes:              >6.4%  Glycemic control for   <7.0% adults with diabetes    Mean Plasma Glucose 111.15 mg/dL    Comment: Performed at [facility] [address] [place], [place] [address]  Type and screen [facility]     Status: None   Collection Time: 09/13/20  9:19 PM  Result Value Ref Range   ABO/RH(D) A POS    Antibody Screen NEG    Sample Expiration      09/16/2020,2359 Performed at [facility], [address], [place],  [address]   CBG monitoring, ED     Status: None   Collection Time: 09/13/20 11:32 PM  Result Value Ref Range   Glucose-Capillary 85 70 - 99 mg/dL    Comment: Glucose reference range applies only to samples taken after fasting for at least 8 hours.  Glucose, capillary     Status: None   Collection Time: 09/14/20 12:16 AM  Result Value Ref Range   Glucose-Capillary 78 70 - 99 mg/dL    Comment: Glucose reference range applies only to samples taken after fasting for at least 8 hours.  HIV Antibody (routine testing w rflx)     Status: None   Collection Time: 09/14/20  4:14 AM  Result Value Ref Range   HIV Screen 4th Generation wRfx Non Reactive Non Reactive     Comment:  Performed at [facility], [place] [address] [place], [place] [address]  Ferritin     Status: None   Collection Time: 09/14/20  4:20 AM  Result Value Ref Range   Ferritin 32 24 - 336 ng/mL    Comment: Performed at [facility] [address] [place], [place] [address]  Glucose, capillary     Status: None   Collection Time: 09/14/20  7:32 AM  Result Value Ref Range   Glucose-Capillary 88 70 - 99 mg/dL    Comment: Glucose reference range applies only to samples taken after fasting for at least 8 hours.  Protein / creatinine ratio, urine     Status: Abnormal   Collection Time: 09/14/20  8:53 AM  Result Value Ref Range   Creatinine, Urine 508.71 mg/dL    Comment: RESULTS CONFIRMED BY MANUAL DILUTION   Total Protein, Urine 183 mg/dL    Comment: RESULTS CONFIRMED BY MANUAL DILUTION   Protein Creatinine Ratio 0.36 (H) 0.00 - 0.15 mg/mg[Cre]    Comment: Performed at [facility], [address], [place], [place] [address]  Glucose, capillary     Status: Abnormal   Collection Time: 09/14/20 11:15 AM  Result Value Ref Range   Glucose-Capillary 123 (H) 70 - 99 mg/dL    Comment: Glucose reference range applies only to samples taken after fasting for at least 8 hours.  Resp Panel by RT-PCR (Flu A&B, Covid) Nasopharyngeal Swab     Status: None   Collection Time: 09/14/20 11:52 AM   Specimen: Nasopharyngeal Swab; Nasopharyngeal(NP) swabs in vial transport medium  Result Value Ref Range   SARS Coronavirus 2 by RT PCR NEGATIVE NEGATIVE    Comment: (NOTE) SARS-CoV-2 target nucleic acids are NOT DETECTED.  The SARS-CoV-2 RNA is generally detectable in upper respiratory specimens during the acute phase of infection. The lowest concentration of SARS-CoV-2 viral copies this assay can detect is 138 copies/mL. A negative result does not preclude SARS-Cov-2 infection and should not be used as the sole basis for treatment or other patient management decisions. A negative result may occur  with  improper specimen collection/handling, submission of specimen other than nasopharyngeal swab, presence of viral mutation(s) within the areas targeted by this assay, and inadequate number of viral copies(<138 copies/mL). A negative result must be combined with clinical observations, patient history, and epidemiological information. The expected result is Negative.  Fact Sheet for Patients:  EntrepreneurPulse.com.au  Fact Sheet for Healthcare Providers:  IncredibleEmployment.be  This test is no t yet approved or cleared by the [place] FDA and  has been authorized for detection and/or diagnosis of SARS-CoV-2 by FDA under an Emergency Use Authorization (EUA). This EUA will remain  in effect (meaning this test can be used) for the duration of the COVID-19 declaration under Section 564(b)(1) of the Act, 21 U.S.C.section 360bbb-3(b)(1), unless the authorization is terminated  or revoked sooner.       Influenza A by PCR NEGATIVE NEGATIVE   Influenza B by PCR NEGATIVE NEGATIVE    Comment: (NOTE) The Xpert Xpress SARS-CoV-2/FLU/RSV plus assay is intended as an aid in the diagnosis of influenza from Nasopharyngeal swab specimens and should not be used as a sole basis for treatment. Nasal washings and aspirates are unacceptable for Xpert Xpress SARS-CoV-2/FLU/RSV testing.  Fact Sheet for Patients: EntrepreneurPulse.com.au  Fact Sheet for Healthcare Providers: IncredibleEmployment.be  This test is not yet approved or cleared by the [place] FDA and has been authorized for detection and/or diagnosis of SARS-CoV-2 by  FDA under an Emergency Use Authorization (EUA). This EUA will remain in effect (meaning this test can be used) for the duration of the COVID-19 declaration under Section 564(b)(1) of the Act, 21 U.S.C. section 360bbb-3(b)(1), unless the authorization is terminated or revoked.  Performed  at [facility], [address] [place], [place] [address]   Glucose, capillary     Status: None   Collection Time: 09/14/20  4:12 PM  Result Value Ref Range   Glucose-Capillary 97 70 - 99 mg/dL    Comment: Glucose reference range applies only to samples taken after fasting for at least 8 hours.  Glucose, capillary     Status: Abnormal   Collection Time: 09/14/20  7:40 PM  Result Value Ref Range   Glucose-Capillary 107 (H) 70 - 99 mg/dL    Comment: Glucose reference range applies only to samples taken after fasting for at least 8 hours.   Comment 1 Notify RN    Comment 2 Document in Chart   Basic metabolic panel     Status: Abnormal   Collection Time: 09/15/20  2:23 AM  Result Value Ref Range   Sodium 140 135 - 145 mmol/L   Potassium 3.7 3.5 - 5.1 mmol/L   Chloride 102 98 - 111 mmol/L   CO2 30 22 - 32 mmol/L   Glucose, Bld 113 (H) 70 - 99 mg/dL    Comment: Glucose reference range applies only to samples taken after fasting for at least 8 hours.   BUN 24 (H) 8 - 23 mg/dL   Creatinine, Ser 1.32 (H) 0.61 - 1.24 mg/dL   Calcium 8.8 (L) 8.9 - 10.3 mg/dL   GFR, Estimated 59 (L) >60 mL/min    Comment: (NOTE) Calculated using the CKD-EPI Creatinine Equation (2021)    Anion gap 8 5 - 15    Comment: Performed at [facility], [address] [place], [place] [address]  CBC     Status: Abnormal   Collection Time: 09/15/20  2:23 AM  Result Value Ref Range   WBC 6.1 4.0 - 10.5 K/uL   RBC 2.66 (L) 4.22 - 5.81 MIL/uL   Hemoglobin 7.5 (L) 13.0 - 17.0 g/dL   HCT 25.5 (L) 39.0 - 52.0 %   MCV 95.9 80.0 - 100.0 fL   MCH 28.2 26.0 - 34.0 pg   MCHC 29.4 (L) 30.0 - 36.0 g/dL   RDW 15.3 11.5 - 15.5 %   Platelets 268 150 - 400 K/uL   nRBC 0.0 0.0 - 0.2 %    Comment: Performed at [facility], [address], [place], [place] [address]  Glucose, capillary     Status: Abnormal   Collection Time: 09/15/20  7:27 AM  Result Value Ref Range   Glucose-Capillary 63 (L) 70 - 99 mg/dL    Comment:  Glucose reference range applies only to samples taken after fasting for at least 8 hours.  Glucose, capillary     Status: Abnormal   Collection Time: 09/15/20  8:12 AM  Result Value Ref Range   Glucose-Capillary 119 (H) 70 - 99 mg/dL    Comment: Glucose reference range applies only to samples taken after fasting for at least 8 hours.  Glucose, capillary     Status: Abnormal   Collection Time: 09/15/20 11:14 AM  Result Value Ref Range   Glucose-Capillary 102 (H) 70 - 99 mg/dL    Comment: Glucose reference range applies only to samples taken after fasting for at least 8 hours.  DG Chest 2 View  Result Date: 09/13/2020 CLINICAL DATA:  Leg swelling. EXAM: CHEST - 2 VIEW COMPARISON:  11/09/2016 FINDINGS: Cardiac enlargement. No pleural effusion or edema. No airspace opacities identified. Thoracic spondylosis noted. IMPRESSION: 1. Cardiac enlargement. 2. No heart failure. Electronically Signed   By: Kerby Moors M.D.   On: 09/13/2020 12:38   US Venous Img Lower Bilateral (DVT)  Result Date: 09/14/2020 CLINICAL DATA:  Bilateral lower extremity swelling for 1 month EXAM: BILATERAL LOWER EXTREMITY VENOUS DOPPLER ULTRASOUND TECHNIQUE: Gray-scale sonography with compression, as well as color and duplex ultrasound, were performed to evaluate the deep venous system(s) from the level of the common femoral vein through the popliteal and proximal calf veins. COMPARISON:  None. FINDINGS: VENOUS Normal compressibility of the common femoral, superficial femoral, and popliteal veins, as well as the visualized calf veins. Visualized portions of profunda femoral vein and great saphenous vein unremarkable. No filling defects to suggest DVT on grayscale or color Doppler imaging. Doppler waveforms show normal direction of venous flow, normal respiratory plasticity and response to augmentation. OTHER None. Limitations: none IMPRESSION: No lower extremity DVT Electronically Signed   By: Miachel Roux M.D.   On:  09/14/2020 11:09   ECHOCARDIOGRAM COMPLETE  Result Date: 09/14/2020    ECHOCARDIOGRAM REPORT   Patient Name:   Dennis Swanson Date of Exam: 09/14/2020 Medical Rec #:  [id number]           Height:       72.0 in Accession #:    [id number]          Weight:       260.0 lb Date of Birth:  01-Sep-1955            BSA:          2.382 m Patient Age:    71 years            BP:           115/78 mmHg Patient Gender: M                   HR:           59 bpm. Exam Location:  [facility] Procedure: 2D Echo, Cardiac Doppler and Color Doppler Indications:    I26.02 Pulmonary embolus  History:        Patient has prior history of Echocardiogram examinations, most                 recent 05/06/2015. CHF, Arrythmias:Atrial Flutter; Risk                 Factors:Hypertension. Alcohol abuse.  Sonographer:    Jonelle Sidle Dance Referring Phys: [email or web address].Ahr DAVID TAT IMPRESSIONS  1. Right ventricular systolic function is severely reduced. The right ventricular size is severely enlarged. There is moderately elevated pulmonary artery systolic pressure. The estimated right ventricular systolic pressure is 123XX123 mmHg.  2. 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  3. Left ventricular ejection fraction, by estimation, is 45 to 50%. The left ventricle has low normal function. The left ventricle demonstrates global hypokinesis. There is moderate left ventricular hypertrophy. Left ventricular diastolic parameters are  indeterminate. There is the interventricular septum is flattened in systole and diastole, consistent with right ventricular pressure and volume overload.  4. Left atrial size was severely dilated.  5. Right atrial size was severely dilated.  6. The mitral valve is grossly normal. Mild mitral valve regurgitation. No evidence of mitral stenosis.  7. The aortic valve is tricuspid. There is mild calcification of the aortic valve. Aortic valve regurgitation is trivial. No aortic stenosis is present.  8. Aortic  dilatation noted. There is mild dilatation of the ascending aorta, measuring 40 mm.  9. The inferior vena cava is dilated in size with <50% respiratory variability, suggesting right atrial pressure of 15 mmHg. 10. Cannot exclude small PFO. FINDINGS  Left Ventricle: Left ventricular ejection fraction, by estimation, is 45 to 50%. The left ventricle has low normal function. The left ventricle demonstrates global hypokinesis. The left ventricular internal cavity size was normal in size. There is moderate left ventricular hypertrophy. The interventricular septum is flattened in systole and diastole, consistent with right ventricular pressure and volume overload. Left ventricular diastolic parameters are indeterminate. Right Ventricle: The right ventricular size is severely enlarged. No increase in right ventricular wall thickness. Right ventricular systolic function is severely reduced. There is moderately elevated pulmonary artery systolic pressure. The tricuspid regurgitant velocity is 3.22 m/s, and with an assumed right atrial pressure of 15 mmHg, the estimated right ventricular systolic pressure is 123XX123 mmHg. Left Atrium: Left atrial size was severely dilated. Right Atrium: Right atrial size was severely dilated. Pericardium: There is no evidence of pericardial effusion. Mitral Valve: The mitral valve is grossly normal. Mild mitral valve regurgitation. No evidence of mitral valve stenosis. Tricuspid Valve: The tricuspid valve is normal in structure. Tricuspid valve regurgitation is severe. No evidence of tricuspid stenosis. Aortic Valve: The aortic valve is tricuspid. There is mild calcification of the aortic valve. Aortic valve regurgitation is trivial. No aortic stenosis is present. Pulmonic Valve: The pulmonic valve was grossly normal. Pulmonic valve regurgitation is mild. No evidence of pulmonic stenosis. Aorta: Aortic dilatation noted. There is mild dilatation of the ascending aorta, measuring 40 mm. Venous:  The inferior vena cava is dilated in size with less than 50% respiratory variability, suggesting right atrial pressure of 15 mmHg. IAS/Shunts: Cannot exclude small PFO.  LEFT VENTRICLE PLAX 2D LVIDd:         5.69 cm  Diastology LVIDs:         4.43 cm  LV e' medial:    7.53 cm/s LV PW:         1.43 cm  LV E/e' medial:  9.5 LV IVS:        1.39 cm  LV e' lateral:   15.70 cm/s LVOT diam:     2.10 cm  LV E/e' lateral: 4.5 LV SV:         50 LV SV Index:   21 LVOT Area:     3.46 cm  RIGHT VENTRICLE RV Basal diam:  5.20 cm RV Mid diam:    3.53 cm RV S prime:     8.32 cm/s TAPSE (M-mode): 1.9 cm LEFT ATRIUM              Index       RIGHT ATRIUM           Index LA diam:        6.30 cm  2.64 cm/m  RA Area:     55.20 cm LA Vol (A2C):   176.0 ml 73.89 ml/m RA Volume:   268.00 ml 112.52 ml/m LA Vol (A4C):   125.0 ml 52.48 ml/m LA Biplane Vol: 148.0 ml 62.14 ml/m  AORTIC VALVE LVOT Vmax:   80.60 cm/s LVOT Vmean:  57.400 cm/s LVOT VTI:    0.145 m  AORTA Ao Root diam: 3.80 cm Ao  Asc diam:  4.00 cm MITRAL VALVE               TRICUSPID VALVE MV Area (PHT): 2.29 cm    TR Peak grad:   41.5 mmHg MV Decel Time: 331 msec    TR Vmax:        322.00 cm/s MV E velocity: 71.20 cm/s MV A velocity: 31.60 cm/s  SHUNTS MV E/A ratio:  2.25        Systemic VTI:  0.14 m                            Systemic Diam: 2.10 cm Cherlynn Kaiser MD Electronically signed by Cherlynn Kaiser MD Signature Date/Time: 09/14/2020/5:35:04 PM    Final      Assessment & Plan:  HARDWICK STREIFEL is a 65 y.o. male with an anal mass on exam and no other sources for bleeding.  This has been biopsied but potentially is a anal cancer versus a high grade dysplastic lesion. No hardened masses felt on my internal exam.  -Pathology pending -CT c/a/p for work up  -Discussed with patient if this is an anal cancer they are normally treated with neoadjuvant chemoradiation and often do not come to any surgery with today's treatment.  -No surgical indication at this time  as biopsies have been sent  -Known hernia discussed incarceration and strangulation symptoms and reasons to go to the ED  -CHF, bradycardia and aggressive management required by anesthesia, would need cardiac workup before any surgical procedures   All questions were answered to the satisfaction of the patient.     Virl Cagey 09/15/2020, 11:50 AM

## 2020-09-15 NOTE — Progress Notes (Signed)
Dr. Carles Collet notified that CBG's are still running low.

## 2020-09-15 NOTE — Op Note (Signed)
[facility] Patient Name: Dennis Swanson Procedure Date: 09/15/2020 10:41 AM MRN: [id number] Date of Birth: 06-Sep-1955 Attending MD: Elon Alas. Abbey Chatters DO CSN: [id number] Age: 65 Admit Type: Inpatient Procedure:                Colonoscopy Indications:              Acute post hemorrhagic anemia Providers:                Elon Alas. Abbey Chatters, DO, Lurline Del, RN, Wynonia Musty Tech, Technician Referring MD:              Medicines:                See the Anesthesia note for documentation of the                            administered medications Complications:            No immediate complications. Estimated Blood Loss:     Estimated blood loss was minimal. Procedure:                Pre-Anesthesia Assessment:                           - The anesthesia plan was to use monitored                            anesthesia care (MAC).                           After obtaining informed consent, the colonoscope                            was passed under direct vision. Throughout the                            procedure, the patient's blood pressure, pulse, and                            oxygen saturations were monitored continuously. The                            [id number]-HQ190L ([id number]) scope was introduced through                            the anus and advanced to the the cecum, identified                            by appendiceal orifice and ileocecal valve. The                            colonoscopy was technically difficult and complex                            due to a redundant colon and  significant looping.                            Successful completion of the procedure was aided by                            applying abdominal pressure. The patient tolerated                            the procedure well. The quality of the bowel                            preparation was evaluated using the BBPS [facility]                            Bowel Preparation Scale)  with scores of: Right                            Colon = 2 (minor amount of residual staining, small                            fragments of stool and/or opaque liquid, but mucosa                            seen well), Transverse Colon = 2 (minor amount of                            residual staining, small fragments of stool and/or                            opaque liquid, but mucosa seen well) and Left Colon                            = 2 (minor amount of residual staining, small                            fragments of stool and/or opaque liquid, but mucosa                            seen well). The total BBPS score equals 6. The                            quality of the bowel preparation was fair. Scope In: 10:43:00 AM Scope Out: 11:00:13 AM Scope Withdrawal Time: 0 hours 4 minutes 58 seconds  Total Procedure Duration: 0 hours 17 minutes 13 seconds  Findings:      The digital rectal exam revealed a 2 cm (diameter) rubbery-textured and       nodular anal mass.      Hemorrhoids were found on perianal exam.      Internal hemorrhoids were found during retroflexion.      Multiple small-mouthed diverticula were found in the sigmoid colon and       descending colon.      The colon (entire examined portion)  was moderately redundant.      The colon (entire examined portion) revealed moderately excessive       looping. Advancing the scope required applying abdominal pressure. Impression:               - Preparation of the colon was fair.                           - Anal mass.                           - Hemorrhoids found on perianal exam.                           - Internal hemorrhoids.                           - Diverticulosis in the sigmoid colon and in the                            descending colon.                           - Redundant colon.                           - There was significant looping of the colon.                           - No specimens collected. Moderate  Sedation:      Per Anesthesia Care Recommendation:           - Return patient to hospital ward for ongoing care.                           - Heart healthy diet.                           - Continue present medications.                           - Await pathology results.                           - Repeat colonoscopy for surveillance based on                            pathology results.                           - General surgeon Dr. Constance Haw evaluated patient                            during colonoscopy. Will consult formally in                            regards to new anal mass. Likely patient will need  CT chest abdomen pelvis to further evaluate for                            metastatic disease. We should have pathology                            results back in 1 to 2 days. Hold Eliquis for 48                            hours. Procedure Code(s):        --- Professional ---                           [phone], Colonoscopy, flexible; diagnostic, including                            collection of specimen(s) by brushing or washing,                            when performed (separate procedure) Diagnosis Code(s):        --- Professional ---                           K64.8, Other hemorrhoids                           K62.89, Other specified diseases of anus and rectum                           D62, Acute posthemorrhagic anemia                           K57.30, Diverticulosis of large intestine without                            perforation or abscess without bleeding                           Q43.8, Other specified congenital malformations of                            intestine CPT copyright 2019 [employer]. All rights reserved. The codes documented in this report are preliminary and upon coder review may  be revised to meet current compliance requirements. Elon Alas. Abbey Chatters, DO [place] Abbey Chatters, DO 09/15/2020 11:15:48 AM This report has  been signed electronically. Number of Addenda: 0

## 2020-09-15 NOTE — Progress Notes (Signed)
Patient off floor to Endo.   

## 2020-09-15 NOTE — Transfer of Care (Signed)
Immediate Anesthesia Transfer of Care Note  Patient: Dennis Swanson  Procedure(s) Performed: ESOPHAGOGASTRODUODENOSCOPY (EGD) WITH PROPOFOL COLONOSCOPY WITH PROPOFOL  Patient Location: PACU  Anesthesia Type:General  Level of Consciousness: awake  Airway & Oxygen Therapy: Patient Spontanous Breathing and Patient connected to nasal cannula oxygen  Post-op Assessment: Report given to RN and Post -op Vital signs reviewed and stable  Post vital signs: Reviewed and stable  Last Vitals:  Vitals Value Taken Time  BP 136/97 09/15/20 1105  Temp    Pulse 64 09/15/20 1108  Resp 32 09/15/20 1108  SpO2 91 % 09/15/20 1108  Vitals shown include unvalidated device data.  Last Pain:  Vitals:   09/15/20 0739  TempSrc:   PainSc: 0-No pain         Complications: No notable events documented.

## 2020-09-15 NOTE — Interval H&P Note (Signed)
History and Physical Interval Note:  09/15/2020 9:51 AM  Dennis Swanson  has presented today for surgery, with the diagnosis of GI bleed, acute blood loss anemia.  The various methods of treatment have been discussed with the patient and family. After consideration of risks, benefits and other options for treatment, the patient has consented to  Procedure(s): ESOPHAGOGASTRODUODENOSCOPY (EGD) WITH PROPOFOL (N/A) COLONOSCOPY WITH PROPOFOL (N/A) as a surgical intervention.  The patient's history has been reviewed, patient examined, no change in status, stable for surgery.  I have reviewed the patient's chart and labs.  Questions were answered to the patient's satisfaction.     Eloise Harman

## 2020-09-15 NOTE — Progress Notes (Signed)
PROGRESS NOTE  Dennis Swanson [id number] DOB: [birth year] DOA: 09/13/2020 PCP: Pcp, No   Brief History:  65 year old male with a history of diabetes mellitus type 2 , atrial fibrillation previously on rivaroxaban, hypertension, hyperlipidemia, systolic and diastolic CHF presenting with a chief complaint of lower extremity edema.  The patient states that he was just recently released from incarceration on 08/26/2020, but has noticed increasing lower extremity edema over the past month even while he was incarcerated.  He denied any fevers, chills, chest pain, shortness of breath, cough, hemoptysis.  He states that he has had some infrequent episodes of dyspnea on exertion, but is able to do most of his activities of daily living without any shortness of breath.  The patient states that he was receiving colchicine and some other type of medicine for his gout during his incarceration.  He states that he has been using Aleve about 2-3 times per week since he has been released.  He denies any abdominal pain, but endorses some hematochezia intermittently over the past 2 weeks.  He denies any melena, hematemesis, dysuria, hematuria.  He denies any orthopnea, increasing abdominal girth, PND.   Review of his medication shows that the patient was receiving apixaban 2.5 mg once daily, furosemide 40 mg twice daily, furosemide 40 mg twice daily, lisinopril 40 mg daily during his incarceration.   Assessment/Plan: Hematochezia/symptomatic anemia -GI consult appreciated>>EGD and colonoscopy on 7/31 -Clear liquid diet for now>>full liquids -Continue Protonix--increased to twice daily -previous baseline Hgb 14 -presented with Hgb 7.3 -iron saturation 3 -check ferritin--32 -7/31 colonoscopy--anal mass, hemorrhoids, diverticulosis -7/31 EGD--gastritis  Anal Mass -noted on rectal exam/colonoscopy -await path -holding apixaban x 48 hours   Chronic systolic and diastolic CHF -[id number] Echo EF  40-45% -05/06/2015 echo EF 60 to 65%, grade 1 DD -09/14/20 Echo--EF 45-50%, global HK, PASP 56.5 -Appears clinically euvolemic -Continue oral furosemide 8/1 if BP improved  Nonsustained Ventricular Tachycardia -occurred during endoscopy- -optimize electrolytes -TSH  Iron deficiency Anemia -dose Ferrulicit  Hypotension -perioperative period for endoscopy -LR bolus 1L -temporarily hold lisinopril and metoprolol   Leg edema -Venous duplex--neg -Urine protein creatinine ratio--0.36 -Echocardiogram--as above   Atrial fibrillation, type unspecified -Holding apixaban -Rate control presently -Continue metoprolol--hold temporarily   Diabetes mellitus type 2 -Previously on metformin -Check hemoglobin A1c--5.5 -NovoLog sliding scale   CKD stage IIIa -Baseline creatinine 1.2-1.4 -Monitor serially   Essential hypertension -Continue lisinopril and metoprolol--hold temporarily due to perioperative hypotension for endoscopy   Hyperlipidemia -continue statin   Class 2 Obesity -BMI 35.26 -lifestyle modification           Status is: Inpatient   The patient will require care spanning > 2 midnights and should be moved to inpatient because: IV treatments appropriate due to intensity of illness or inability to take PO   Dispo: The patient is from: Home              Anticipated d/c is to: Home              Patient currently is not medically stable to d/c.              Difficult to place patient No      Subjective: Patient denies fevers, chills, headache, chest pain, dyspnea, nausea, vomiting, diarrhea, abdominal pain, dysuria, hematuria, hematochezia, and melena.   Objective: Vitals:   09/15/20 1238 09/15/20 1245 09/15/20 1326 09/15/20 1329  BP: 99/72 101/76 95/67 109/77  Pulse:   Marland Kitchen)  59   Resp:   18   Temp:   98.1 F (36.7 C)   TempSrc:      SpO2:   100%   Weight:      Height:        Intake/Output Summary (Last 24 hours) at 09/15/2020 1457 Last data filed at  09/15/2020 1130 Gross per 24 hour  Intake 2044 ml  Output --  Net 2044 ml   Weight change:  Exam:  General:  Pt is alert, follows commands appropriately, not in acute distress HEENT: No icterus, No thrush, No neck mass, [place]/AT Cardiovascular: RRR, S1/S2, no rubs, no gallops Respiratory: CTA bilaterally, no wheezing, no crackles, no rhonchi Abdomen: Soft/+BS, non tender, non distended, no guarding Extremities: No edema, No lymphangitis, No petechiae, No rashes, no synovitis   Data Reviewed: I have personally reviewed following labs and imaging studies Basic Metabolic Panel: Recent Labs  Lab 09/13/20 1213 09/15/20 0223  NA 142 140  K 3.6 3.7  CL 105 102  CO2 31 30  GLUCOSE 102* 113*  BUN 23 24*  CREATININE 1.20 1.32*  CALCIUM 8.6* 8.8*   Liver Function Tests: Recent Labs  Lab 09/13/20 1412  AST 19  ALT 13  ALKPHOS 61  BILITOT 0.7  PROT 6.1*  ALBUMIN 3.2*   No results for input(s): LIPASE, AMYLASE in the last 168 hours. No results for input(s): AMMONIA in the last 168 hours. Coagulation Profile: No results for input(s): INR, PROTIME in the last 168 hours. CBC: Recent Labs  Lab 09/13/20 1213 09/15/20 0223  WBC 5.4 6.1  HGB 7.3* 7.5*  HCT 24.1* 25.5*  MCV 97.6 95.9  PLT 231 268   Cardiac Enzymes: No results for input(s): CKTOTAL, CKMB, CKMBINDEX, TROPONINI in the last 168 hours. BNP: Invalid input(s): POCBNP CBG: Recent Labs  Lab 09/14/20 1612 09/14/20 1940 09/15/20 0727 09/15/20 0812 09/15/20 1114  GLUCAP 97 107* 63* 119* 102*   HbA1C: Recent Labs    09/13/20 2119  HGBA1C 5.5   Urine analysis:    Component Value Date/Time   COLORURINE YELLOW 09/13/2020 1603   APPEARANCEUR CLEAR 09/13/2020 1603   LABSPEC 1.024 09/13/2020 1603   PHURINE 6.0 09/13/2020 1603   GLUCOSEU NEGATIVE 09/13/2020 1603   HGBUR NEGATIVE 09/13/2020 1603   BILIRUBINUR NEGATIVE 09/13/2020 1603   KETONESUR NEGATIVE 09/13/2020 1603   PROTEINUR >=300 (A) 09/13/2020 1603    UROBILINOGEN 0.2 08/20/2009 1446   NITRITE NEGATIVE 09/13/2020 1603   LEUKOCYTESUR NEGATIVE 09/13/2020 1603   Sepsis Labs: '@LABRCNTIP'$ (procalcitonin:4,lacticidven:4) ) Recent Results (from the past 240 hour(s))  Resp Panel by RT-PCR (Flu A&B, Covid) Nasopharyngeal Swab     Status: None   Collection Time: 09/14/20 11:52 AM   Specimen: Nasopharyngeal Swab; Nasopharyngeal(NP) swabs in vial transport medium  Result Value Ref Range Status   SARS Coronavirus 2 by RT PCR NEGATIVE NEGATIVE Final    Comment: (NOTE) SARS-CoV-2 target nucleic acids are NOT DETECTED.  The SARS-CoV-2 RNA is generally detectable in upper respiratory specimens during the acute phase of infection. The lowest concentration of SARS-CoV-2 viral copies this assay can detect is 138 copies/mL. A negative result does not preclude SARS-Cov-2 infection and should not be used as the sole basis for treatment or other patient management decisions. A negative result may occur with  improper specimen collection/handling, submission of specimen other than nasopharyngeal swab, presence of viral mutation(s) within the areas targeted by this assay, and inadequate number of viral copies(<138 copies/mL). A negative result must be combined with  clinical observations, patient history, and epidemiological information. The expected result is Negative.  Fact Sheet for Patients:  EntrepreneurPulse.com.au  Fact Sheet for Healthcare Providers:  IncredibleEmployment.be  This test is no t yet approved or cleared by the [place] FDA and  has been authorized for detection and/or diagnosis of SARS-CoV-2 by FDA under an Emergency Use Authorization (EUA). This EUA will remain  in effect (meaning this test can be used) for the duration of the COVID-19 declaration under Section 564(b)(1) of the Act, 21 U.S.C.section 360bbb-3(b)(1), unless the authorization is terminated  or revoked sooner.        Influenza A by PCR NEGATIVE NEGATIVE Final   Influenza B by PCR NEGATIVE NEGATIVE Final    Comment: (NOTE) The Xpert Xpress SARS-CoV-2/FLU/RSV plus assay is intended as an aid in the diagnosis of influenza from Nasopharyngeal swab specimens and should not be used as a sole basis for treatment. Nasal washings and aspirates are unacceptable for Xpert Xpress SARS-CoV-2/FLU/RSV testing.  Fact Sheet for Patients: EntrepreneurPulse.com.au  Fact Sheet for Healthcare Providers: IncredibleEmployment.be  This test is not yet approved or cleared by the [place] FDA and has been authorized for detection and/or diagnosis of SARS-CoV-2 by FDA under an Emergency Use Authorization (EUA). This EUA will remain in effect (meaning this test can be used) for the duration of the COVID-19 declaration under Section 564(b)(1) of the Act, 21 U.S.C. section 360bbb-3(b)(1), unless the authorization is terminated or revoked.  Performed at [facility], [address], [place], [place] [address]      Scheduled Meds:  atorvastatin  40 mg Oral q1800   colchicine  0.6 mg Oral BID   insulin aspart  0-15 Units Subcutaneous TID WC   lisinopril  10 mg Oral Daily   metoprolol tartrate  25 mg Oral BID   pantoprazole (PROTONIX) IV  40 mg Intravenous Q12H   sodium chloride flush  3 mL Intravenous Q12H   Continuous Infusions:  sodium chloride      Procedures/Studies: DG Chest 2 View  Result Date: 09/13/2020 CLINICAL DATA:  Leg swelling. EXAM: CHEST - 2 VIEW COMPARISON:  11/09/2016 FINDINGS: Cardiac enlargement. No pleural effusion or edema. No airspace opacities identified. Thoracic spondylosis noted. IMPRESSION: 1. Cardiac enlargement. 2. No heart failure. Electronically Signed   By: Kerby Moors M.D.   On: 09/13/2020 12:38   US Venous Img Lower Bilateral (DVT)  Result Date: 09/14/2020 CLINICAL DATA:  Bilateral lower extremity swelling for 1 month EXAM: BILATERAL LOWER  EXTREMITY VENOUS DOPPLER ULTRASOUND TECHNIQUE: Gray-scale sonography with compression, as well as color and duplex ultrasound, were performed to evaluate the deep venous system(s) from the level of the common femoral vein through the popliteal and proximal calf veins. COMPARISON:  None. FINDINGS: VENOUS Normal compressibility of the common femoral, superficial femoral, and popliteal veins, as well as the visualized calf veins. Visualized portions of profunda femoral vein and great saphenous vein unremarkable. No filling defects to suggest DVT on grayscale or color Doppler imaging. Doppler waveforms show normal direction of venous flow, normal respiratory plasticity and response to augmentation. OTHER None. Limitations: none IMPRESSION: No lower extremity DVT Electronically Signed   By: Miachel Roux M.D.   On: 09/14/2020 11:09   ECHOCARDIOGRAM COMPLETE  Result Date: 09/14/2020    ECHOCARDIOGRAM REPORT   Patient Name:   Dennis Swanson Date of Exam: 09/14/2020 Medical Rec #:  [id number]           Height:       72.0  in Accession #:    [id number]          Weight:       260.0 lb Date of Birth:  05/03/1955            BSA:          2.382 m Patient Age:    63 years            BP:           115/78 mmHg Patient Gender: M                   HR:           59 bpm. Exam Location:  [facility] Procedure: 2D Echo, Cardiac Doppler and Color Doppler Indications:    I26.02 Pulmonary embolus  History:        Patient has prior history of Echocardiogram examinations, most                 recent 05/06/2015. CHF, Arrythmias:Atrial Flutter; Risk                 Factors:Hypertension. Alcohol abuse.  Sonographer:    Jonelle Sidle Dance Referring Phys: Navarro.Ahr Tai Skelly IMPRESSIONS  1. Right ventricular systolic function is severely reduced. The right ventricular size is severely enlarged. There is moderately elevated pulmonary artery systolic pressure. The estimated right ventricular systolic pressure is 123XX123 mmHg.  2. 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  3. Left ventricular ejection fraction, by estimation, is 45 to 50%. The left ventricle has low normal function. The left ventricle demonstrates global hypokinesis. There is moderate left ventricular hypertrophy. Left ventricular diastolic parameters are  indeterminate. There is the interventricular septum is flattened in systole and diastole, consistent with right ventricular pressure and volume overload.  4. Left atrial size was severely dilated.  5. Right atrial size was severely dilated.  6. The mitral valve is grossly normal. Mild mitral valve regurgitation. No evidence of mitral stenosis.  7. The aortic valve is tricuspid. There is mild calcification of the aortic valve. Aortic valve regurgitation is trivial. No aortic stenosis is present.  8. Aortic dilatation noted. There is mild dilatation of the ascending aorta, measuring 40 mm.  9. The inferior vena cava is dilated in size with <50% respiratory variability, suggesting right atrial pressure of 15 mmHg. 10. Cannot exclude small PFO. FINDINGS  Left Ventricle: Left ventricular ejection fraction, by estimation, is 45 to 50%. The left ventricle has low normal function. The left ventricle demonstrates global hypokinesis. The left ventricular internal cavity size was normal in size. There is moderate left ventricular hypertrophy. The interventricular septum is flattened in systole and diastole, consistent with right ventricular pressure and volume overload. Left ventricular diastolic parameters are indeterminate. Right Ventricle: The right ventricular size is severely enlarged. No increase in right ventricular wall thickness. Right ventricular systolic function is severely reduced. There is moderately elevated pulmonary artery systolic pressure. The tricuspid regurgitant velocity is 3.22 m/s, and with an assumed right atrial pressure of 15 mmHg, the estimated right ventricular systolic pressure is 123XX123 mmHg. Left  Atrium: Left atrial size was severely dilated. Right Atrium: Right atrial size was severely dilated. Pericardium: There is no evidence of pericardial effusion. Mitral Valve: The mitral valve is grossly normal. Mild mitral valve regurgitation. No evidence of mitral valve stenosis. Tricuspid Valve: The tricuspid valve is normal in structure. Tricuspid valve regurgitation is severe. No evidence of tricuspid stenosis. Aortic Valve: The  aortic valve is tricuspid. There is mild calcification of the aortic valve. Aortic valve regurgitation is trivial. No aortic stenosis is present. Pulmonic Valve: The pulmonic valve was grossly normal. Pulmonic valve regurgitation is mild. No evidence of pulmonic stenosis. Aorta: Aortic dilatation noted. There is mild dilatation of the ascending aorta, measuring 40 mm. Venous: The inferior vena cava is dilated in size with less than 50% respiratory variability, suggesting right atrial pressure of 15 mmHg. IAS/Shunts: Cannot exclude small PFO.  LEFT VENTRICLE PLAX 2D LVIDd:         5.69 cm  Diastology LVIDs:         4.43 cm  LV e' medial:    7.53 cm/s LV PW:         1.43 cm  LV E/e' medial:  9.5 LV IVS:        1.39 cm  LV e' lateral:   15.70 cm/s LVOT diam:     2.10 cm  LV E/e' lateral: 4.5 LV SV:         50 LV SV Index:   21 LVOT Area:     3.46 cm  RIGHT VENTRICLE RV Basal diam:  5.20 cm RV Mid diam:    3.53 cm RV S prime:     8.32 cm/s TAPSE (M-mode): 1.9 cm LEFT ATRIUM              Index       RIGHT ATRIUM           Index LA diam:        6.30 cm  2.64 cm/m  RA Area:     55.20 cm LA Vol (A2C):   176.0 ml 73.89 ml/m RA Volume:   268.00 ml 112.52 ml/m LA Vol (A4C):   125.0 ml 52.48 ml/m LA Biplane Vol: 148.0 ml 62.14 ml/m  AORTIC VALVE LVOT Vmax:   80.60 cm/s LVOT Vmean:  57.400 cm/s LVOT VTI:    0.145 m  AORTA Ao Root diam: 3.80 cm Ao Asc diam:  4.00 cm MITRAL VALVE               TRICUSPID VALVE MV Area (PHT): 2.29 cm    TR Peak grad:   41.5 mmHg MV Decel Time: 331 msec    TR Vmax:         322.00 cm/s MV E velocity: 71.20 cm/s MV A velocity: 31.60 cm/s  SHUNTS MV E/A ratio:  2.25        Systemic VTI:  0.14 m                            Systemic Diam: 2.10 cm Cherlynn Kaiser MD Electronically signed by Cherlynn Kaiser MD Signature Date/Time: 09/14/2020/5:35:04 PM    Final     Orson Eva, DO  [facility]  If 7PM-7AM, please contact night-coverage [email or web address] Password [email or web address] 09/15/2020, 2:57 PM   LOS: 1 day

## 2020-09-16 ENCOUNTER — Inpatient Hospital Stay (HOSPITAL_COMMUNITY): Payer: Medicaid Other

## 2020-09-16 ENCOUNTER — Telehealth: Payer: Self-pay | Admitting: Gastroenterology

## 2020-09-16 DIAGNOSIS — K2961 Other gastritis with bleeding: Secondary | ICD-10-CM | POA: Diagnosis not present

## 2020-09-16 DIAGNOSIS — N1831 Chronic kidney disease, stage 3a: Secondary | ICD-10-CM | POA: Diagnosis not present

## 2020-09-16 DIAGNOSIS — I4891 Unspecified atrial fibrillation: Secondary | ICD-10-CM | POA: Diagnosis not present

## 2020-09-16 DIAGNOSIS — D500 Iron deficiency anemia secondary to blood loss (chronic): Secondary | ICD-10-CM | POA: Diagnosis not present

## 2020-09-16 LAB — CBC
HCT: 26.1 % — ABNORMAL LOW (ref 39.0–52.0)
Hemoglobin: 7.7 g/dL — ABNORMAL LOW (ref 13.0–17.0)
MCH: 28.3 pg (ref 26.0–34.0)
MCHC: 29.5 g/dL — ABNORMAL LOW (ref 30.0–36.0)
MCV: 96 fL (ref 80.0–100.0)
Platelets: 277 10*3/uL (ref 150–400)
RBC: 2.72 MIL/uL — ABNORMAL LOW (ref 4.22–5.81)
RDW: 15.5 % (ref 11.5–15.5)
WBC: 6.5 10*3/uL (ref 4.0–10.5)
nRBC: 0 % (ref 0.0–0.2)

## 2020-09-16 LAB — BASIC METABOLIC PANEL
Anion gap: 8 (ref 5–15)
BUN: 23 mg/dL (ref 8–23)
CO2: 29 mmol/L (ref 22–32)
Calcium: 8.4 mg/dL — ABNORMAL LOW (ref 8.9–10.3)
Chloride: 101 mmol/L (ref 98–111)
Creatinine, Ser: 1.42 mg/dL — ABNORMAL HIGH (ref 0.61–1.24)
GFR, Estimated: 54 mL/min — ABNORMAL LOW (ref >60)
Glucose, Bld: 94 mg/dL (ref 70–99)
Potassium: 3.7 mmol/L (ref 3.5–5.1)
Sodium: 138 mmol/L (ref 135–145)

## 2020-09-16 LAB — GLUCOSE, CAPILLARY
Glucose-Capillary: 56 mg/dL — ABNORMAL LOW (ref 70–99)
Glucose-Capillary: 86 mg/dL (ref 70–99)
Glucose-Capillary: 71 mg/dL (ref 70–99)

## 2020-09-16 LAB — MAGNESIUM: Magnesium: 2.4 mg/dL (ref 1.7–2.4)

## 2020-09-16 MED ORDER — IOHEXOL 9 MG/ML PO SOLN
ORAL | Status: AC
  Administered 2020-09-16: 500 mL
  Filled 2020-09-16: qty 1000

## 2020-09-16 MED ORDER — FERROUS SULFATE 325 (65 FE) MG PO TABS: 325.0000 mg | ORAL_TABLET | Freq: Every day | ORAL | 3 refills | Status: DC

## 2020-09-16 MED ORDER — FERROUS SULFATE 325 (65 FE) MG PO TABS: 325.0000 mg | ORAL_TABLET | Freq: Every day | ORAL | Status: DC

## 2020-09-16 MED ORDER — CARVEDILOL 3.125 MG PO TABS: 3.1250 mg | ORAL_TABLET | Freq: Two times a day (BID) | ORAL | Status: DC

## 2020-09-16 MED ORDER — IOHEXOL 300 MG/ML  SOLN
100.0000 mL | Freq: Once | INTRAMUSCULAR | Status: AC | PRN
  Administered 2020-09-16: 100 mL via INTRAVENOUS

## 2020-09-16 MED ORDER — PANTOPRAZOLE SODIUM 40 MG PO TBEC: 40.0000 mg | DELAYED_RELEASE_TABLET | Freq: Every day | ORAL | 1 refills | Status: DC

## 2020-09-16 MED ORDER — CARVEDILOL 3.125 MG PO TABS: 3.1250 mg | ORAL_TABLET | Freq: Two times a day (BID) | ORAL | 1 refills | Status: DC

## 2020-09-16 MED ORDER — PANTOPRAZOLE SODIUM 40 MG PO TBEC: 40.0000 mg | DELAYED_RELEASE_TABLET | Freq: Every day | ORAL | Status: DC

## 2020-09-16 MED ORDER — APIXABAN 5 MG PO TABS: 5.0000 mg | ORAL_TABLET | Freq: Two times a day (BID) | ORAL | 1 refills | Status: DC

## 2020-09-16 MED ORDER — APIXABAN 5 MG PO TABS: 5.0000 mg | ORAL_TABLET | Freq: Two times a day (BID) | ORAL | Status: DC

## 2020-09-16 NOTE — Progress Notes (Signed)
Subjective: Feeling well. Planning to go home today. No abdominal pain, nausea, vomiting. Had 2 Bms this morning that were still clear with a little brown stool. No brbpr or melena. Just got back from CT. Receiving IV iron.   Objective: Vital signs in last 24 hours: Temp:  [97.3 F (36.3 C)-98.1 F (36.7 C)] 97.3 F (36.3 C) (08/01 0511) Pulse Rate:  [56-60] 60 (08/01 0511) Resp:  [18] 18 (08/01 0511) BP: (95-109)/(66-77) 101/66 (08/01 0511) SpO2:  [100 %] 100 % (07/31 1326) Last BM Date: 09/16/20 General:   Alert and oriented, pleasant Head:  Normocephalic and atraumatic. Eyes:  No icterus, sclera clear. Conjuctiva pink.  Abdomen:  Bowel sounds present.  Abdomen is protuberant, but soft and nontender. No rebound or guarding. No masses appreciated  Msk:  Symmetrical without gross deformities. Normal posture. Extremities:  With 1+ pitting edema. Neurologic:  Alert and  oriented x4;  grossly normal neurologically. Psych: Normal mood and affect.  Intake/Output from previous day: 07/31 0701 - 08/01 0700 In: 1794.2 [P.O.:920; I.V.:604; IV Piggyback:270.2] Out: -  Intake/Output this shift: Total I/O In: 360 [P.O.:360] Out: -   Lab Results: Recent Labs    09/15/20 0223 09/16/20 0503  WBC 6.1 6.5  HGB 7.5* 7.7*  HCT 25.5* 26.1*  PLT 268 277   BMET Recent Labs    09/15/20 0223 09/16/20 0503  NA 140 138  K 3.7 3.7  CL 102 101  CO2 30 29  GLUCOSE 113* 94  BUN 24* 23  CREATININE 1.32* 1.42*  CALCIUM 8.8* 8.4*   LFT Recent Labs    09/13/20 1412  PROT 6.1*  ALBUMIN 3.2*  AST 19  ALT 13  ALKPHOS 61  BILITOT 0.7  BILIDIR 0.2  IBILI 0.5   Studies/Results: ECHOCARDIOGRAM COMPLETE  Result Date: 09/14/2020    ECHOCARDIOGRAM REPORT   Patient Name:   Dennis Swanson Date of Exam: 09/14/2020 Medical Rec #:  [id number]           Height:       72.0 in Accession #:    [id number]          Weight:       260.0 lb Date of Birth:  1955/08/27            BSA:           2.382 m Patient Age:    65 years            BP:           115/78 mmHg Patient Gender: M                   HR:           59 bpm. Exam Location:  [facility] Procedure: 2D Echo, Cardiac Doppler and Color Doppler Indications:    I26.02 Pulmonary embolus  History:        Patient has prior history of Echocardiogram examinations, most                 recent 05/06/2015. CHF, Arrythmias:Atrial Flutter; Risk                 Factors:Hypertension. Alcohol abuse.  Sonographer:    Jonelle Sidle Dance Referring Phys: Navarro.Ahr DAVID TAT IMPRESSIONS  1. Right ventricular systolic function is severely reduced. The right ventricular size is severely enlarged. There is moderately elevated pulmonary artery systolic pressure. The estimated right ventricular systolic pressure is 123XX123 mmHg.  2. 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  3. Left ventricular ejection fraction, by estimation, is 45 to 50%. The left ventricle has low normal function. The left ventricle demonstrates global hypokinesis. There is moderate left ventricular hypertrophy. Left ventricular diastolic parameters are  indeterminate. There is the interventricular septum is flattened in systole and diastole, consistent with right ventricular pressure and volume overload.  4. Left atrial size was severely dilated.  5. Right atrial size was severely dilated.  6. The mitral valve is grossly normal. Mild mitral valve regurgitation. No evidence of mitral stenosis.  7. The aortic valve is tricuspid. There is mild calcification of the aortic valve. Aortic valve regurgitation is trivial. No aortic stenosis is present.  8. Aortic dilatation noted. There is mild dilatation of the ascending aorta, measuring 40 mm.  9. The inferior vena cava is dilated in size with <50% respiratory variability, suggesting right atrial pressure of 15 mmHg. 10. Cannot exclude small PFO. FINDINGS  Left Ventricle: Left ventricular ejection fraction, by estimation, is 45 to 50%. The  left ventricle has low normal function. The left ventricle demonstrates global hypokinesis. The left ventricular internal cavity size was normal in size. There is moderate left ventricular hypertrophy. The interventricular septum is flattened in systole and diastole, consistent with right ventricular pressure and volume overload. Left ventricular diastolic parameters are indeterminate. Right Ventricle: The right ventricular size is severely enlarged. No increase in right ventricular wall thickness. Right ventricular systolic function is severely reduced. There is moderately elevated pulmonary artery systolic pressure. The tricuspid regurgitant velocity is 3.22 m/s, and with an assumed right atrial pressure of 15 mmHg, the estimated right ventricular systolic pressure is 123XX123 mmHg. Left Atrium: Left atrial size was severely dilated. Right Atrium: Right atrial size was severely dilated. Pericardium: There is no evidence of pericardial effusion. Mitral Valve: The mitral valve is grossly normal. Mild mitral valve regurgitation. No evidence of mitral valve stenosis. Tricuspid Valve: The tricuspid valve is normal in structure. Tricuspid valve regurgitation is severe. No evidence of tricuspid stenosis. Aortic Valve: The aortic valve is tricuspid. There is mild calcification of the aortic valve. Aortic valve regurgitation is trivial. No aortic stenosis is present. Pulmonic Valve: The pulmonic valve was grossly normal. Pulmonic valve regurgitation is mild. No evidence of pulmonic stenosis. Aorta: Aortic dilatation noted. There is mild dilatation of the ascending aorta, measuring 40 mm. Venous: The inferior vena cava is dilated in size with less than 50% respiratory variability, suggesting right atrial pressure of 15 mmHg. IAS/Shunts: Cannot exclude small PFO.  LEFT VENTRICLE PLAX 2D LVIDd:         5.69 cm  Diastology LVIDs:         4.43 cm  LV e' medial:    7.53 cm/s LV PW:         1.43 cm  LV E/e' medial:  9.5 LV IVS:         1.39 cm  LV e' lateral:   15.70 cm/s LVOT diam:     2.10 cm  LV E/e' lateral: 4.5 LV SV:         50 LV SV Index:   21 LVOT Area:     3.46 cm  RIGHT VENTRICLE RV Basal diam:  5.20 cm RV Mid diam:    3.53 cm RV S prime:     8.32 cm/s TAPSE (M-mode): 1.9 cm LEFT ATRIUM              Index  RIGHT ATRIUM           Index LA diam:        6.30 cm  2.64 cm/m  RA Area:     55.20 cm LA Vol (A2C):   176.0 ml 73.89 ml/m RA Volume:   268.00 ml 112.52 ml/m LA Vol (A4C):   125.0 ml 52.48 ml/m LA Biplane Vol: 148.0 ml 62.14 ml/m  AORTIC VALVE LVOT Vmax:   80.60 cm/s LVOT Vmean:  57.400 cm/s LVOT VTI:    0.145 m  AORTA Ao Root diam: 3.80 cm Ao Asc diam:  4.00 cm MITRAL VALVE               TRICUSPID VALVE MV Area (PHT): 2.29 cm    TR Peak grad:   41.5 mmHg MV Decel Time: 331 msec    TR Vmax:        322.00 cm/s MV E velocity: 71.20 cm/s MV A velocity: 31.60 cm/s  SHUNTS MV E/A ratio:  2.25        Systemic VTI:  0.14 m                            Systemic Diam: 2.10 cm Cherlynn Kaiser MD Electronically signed by Cherlynn Kaiser MD Signature Date/Time: 09/14/2020/5:35:04 PM    Final     Assessment: 65 year old male with history of CHF, atrial fibrillation on Eliquis, HTN, and diabetes who presented to the emergency room on 7/29 due to increased lower extremity edema x1 month while incarcerated as well as hematochezia and melena.  He was found to have hemoglobin of 7.3 which was down from 14.4, 3 years ago, heme positive stool, and iron deficiency.  GI was consulted for further evaluation of anemia.  IDA with heme positive stool, melena, hematochezia: S/p EGD and colonoscopy 7/31 with small hiatal hernia, gastritis biopsied, normal examined duodenum.  Colonoscopy with 2 cm nodular anal mass, diverticulosis in the sigmoid and descending colon, internal hemorrhoids.  Due to concerns for malignant process, general surgery was consulted and recommended CT chest abdomen and pelvis for additional work-up.  Stated anal cancer is  normally treated with neoadjuvant chemoradiation and often does not come to surgery with today's treatment.  No surgical indication at this time.  Additionally, Dr. Constance Haw stated due to CHF and bradycardia/aggressive management required by anesthesia during endoscopic procedures, patient would need cardiac work-up before surgical procedures.  Surgical pathology is still pending.  CT chest abdomen pelvis has been completed today, but final report has not resulted.  Hemoglobin has remained stable/improved, up to 7.7 today. Denies brbpr or melena. He is receiving IV iron today prior to discharge.   Anal mass likely source of IDA/GI bleeding. CKD may also be contributing to anemia. We will need to follow-up on pending pathology and CT results. He will need close monitoring of H/H at discharge. He already has an appointment with Dr. Delton Coombes on 8/10 (oncology).   Plan: 1.  Patient is getting discharged today. 2.  Follow-up on surgical pathology. 3.  Follow-up on CT chest abdomen and pelvis results. 4.  Agree with IV iron today and discharging home on oral iron. 5.  Keep upcoming appointment with Dr. Delton Coombes on 8/10. 6.  Restart Eliquis on 8/2 per Dr. Ave Filter recommendations.  7.  Will need repeat CBC in 1 week.     LOS: 2 days    09/16/2020, 12:45 PM   Aliene Altes, [facility]

## 2020-09-16 NOTE — Progress Notes (Signed)
Ate heart healthy diet for lunch with no adverse effects.   IV removed and discharge instructions reviewed.  Calling someone for ride home

## 2020-09-16 NOTE — Discharge Summary (Signed)
Physician Discharge Summary  DAJOUR BUFFONE [id number] DOB: 30-May-1955 DOA: 09/13/2020  PCP: Pcp, No  Admit date: 09/13/2020 Discharge date: 09/16/2020  Admitted From: Home Disposition:  Home   Recommendations for Outpatient Follow-up:  Follow up with PCP in 1-2 weeks Please obtain BMP/CBC in one week outpatient follow up with Dr. Delton Coombes on 09/25/20 at 8AM     Discharge Condition: Stable CODE STATUS: FULL Diet recommendation: Heart Healthy    Brief/Interim Summary: 65 year old male with a history of diabetes mellitus type 2 , atrial fibrillation previously on rivaroxaban, hypertension, hyperlipidemia, systolic and diastolic CHF presenting with a chief complaint of lower extremity edema.  The patient states that he was just recently released from incarceration on 08/26/2020, but has noticed increasing lower extremity edema over the past month even while he was incarcerated.  He denied any fevers, chills, chest pain, shortness of breath, cough, hemoptysis.  He states that he has had some infrequent episodes of dyspnea on exertion, but is able to do most of his activities of daily living without any shortness of breath.  The patient states that he was receiving colchicine and some other type of medicine for his gout during his incarceration.  He states that he has been using Aleve about 2-3 times per week since he has been released.  He denies any abdominal pain, but endorses some hematochezia intermittently over the past 2 weeks.  He denies any melena, hematemesis, dysuria, hematuria.  He denies any orthopnea, increasing abdominal girth, PND.   Review of his medication shows that the patient was receiving apixaban 2.5 mg once daily, furosemide 40 mg twice daily, furosemide 40 mg twice daily, lisinopril 40 mg daily during his incarceration.  Discharge Diagnoses:   Hematochezia/symptomatic anemia -GI consult appreciated>>EGD and colonoscopy on 7/31 -Clear liquid diet for now>>full  liquids>>cardiac diet which he tolerated -Continue Protonix--increased to twice daily -d/c home with protonix once daily -previous baseline Hgb 14 -presented with Hgb 7.3 -iron saturation 3 -check ferritin--32 -7/31 colonoscopy--anal mass, hemorrhoids, diverticulosis -7/31 EGD--gastritis -Hgb stable during hospitalization;  did not require PRBC   Anal Mass -noted on rectal exam/colonoscopy -await path -holding apixaban x 48 hours>>restart 8/2 -outpatient follow up with Dr. Delton Coombes on 09/25/20 at 8AM   Chronic systolic and diastolic CHF -[id number] Echo EF 40-45% -05/06/2015 echo EF 60 to 65%, grade 1 DD -09/14/20 Echo--EF 45-50%, global HK, PASP 56.5 -Appears clinically euvolemic -Continue oral furosemide 8/1 if BP improved>>restart -coreg started   Nonsustained Ventricular Tachycardia -occurred during endoscopy- -optimize electrolytes -Mag 2.4   Iron deficiency Anemia -dose Ferrulicit [id number] mg x 2 -d/c home with ferrous sulfate   Hypotension -perioperative period for endoscopy -LR bolus 1L -temporarily hold lisinopril and metoprolol -BP improved at time of dc   Leg edema -Venous duplex--neg -Urine protein creatinine ratio--0.36 -Echocardiogram--as above   Atrial fibrillation, type unspecified -Holding apixaban -Rate control presently -Continue metoprolol--hold temporarily>>dc -start coreg instead   Diabetes mellitus type 2 -Previously on metformin>>restart -Check hemoglobin A1c--5.5 -NovoLog sliding scale   CKD stage IIIa -Baseline creatinine 1.2-1.4 -Monitor serially   Essential hypertension -Continue lisinopril and metoprolol--hold temporarily due to perioperative hypotension for endoscopy -d/c lisinopril due to CKD and metoprolol -coreg started   Hyperlipidemia -continue statin   Class 2 Obesity -BMI 35.26 -lifestyle modification  Discharge Instructions   Allergies as of 09/16/2020   No Known Allergies      Medication List     STOP  taking these medications    atorvastatin 40 MG tablet Commonly  known as: LIPITOR   Cartia XT 240 MG 24 hr capsule Generic drug: diltiazem   colchicine 0.6 MG tablet   indomethacin 50 MG capsule Commonly known as: INDOCIN   Klor-Con 20 MEQ packet Generic drug: potassium chloride   lisinopril 40 MG tablet Commonly known as: ZESTRIL   metFORMIN 500 MG tablet Commonly known as: Glucophage   metoprolol succinate 50 MG 24 hr tablet Commonly known as: TOPROL-XL   omeprazole 20 MG capsule Commonly known as: PRILOSEC   ranitidine 150 MG capsule Commonly known as: ZANTAC   Xarelto 20 MG Tabs tablet Generic drug: rivaroxaban       TAKE these medications    apixaban 5 MG Tabs tablet Commonly known as: ELIQUIS Take 1 tablet (5 mg total) by mouth 2 (two) times daily. Start taking on: September 17, 2020 What changed:  medication strength how much to take when to take this   carvedilol 3.125 MG tablet Commonly known as: COREG Take 1 tablet (3.125 mg total) by mouth 2 (two) times daily with a meal. Start taking on: September 17, 2020   ferrous sulfate 325 (65 FE) MG tablet Take 1 tablet (325 mg total) by mouth daily with breakfast. Start taking on: September 17, 2020   furosemide 40 MG tablet Commonly known as: LASIX TAKE ONE TABLET BY MOUTH IN THE MORNING AND ONE-HALF IN THE EVENING What changed: See the new instructions.   hydrocortisone 25 MG suppository Commonly known as: ANUSOL-HC Place 1 suppository (25 mg total) rectally 2 (two) times daily.   pantoprazole 40 MG tablet Commonly known as: PROTONIX Take 1 tablet (40 mg total) by mouth daily. Start taking on: September 17, 2020        Follow-up Information     Dwana Melena, MD. Schedule an appointment as soon as possible for a visit .   Specialty: Nephrology Contact information: [place] [address] [phone]         Arnoldo Lenis, MD. Schedule an appointment as soon as possible for a  visit .   Specialty: Cardiology Why: Reestablish cardiac care. Contact information: [place] [address] [phone]         Eloise Harman, DO. Schedule an appointment as soon as possible for a visit .   Specialty: Gastroenterology Why: to discuss the intermittent rectal bleeding.  This may be from your hemorrhoid, but you may need a colonoscopy to make sure there is no other source of this bleeding. Contact information: [place] [address] [phone]         Derek Jack, MD. Go on 09/25/2020.   Specialty: Hematology Why: at 8:00 am Contact information: [address] [place] [address] [phone]                No Known Allergies  Consultations: GI General surgery   Procedures/Studies: DG Chest 2 View  Result Date: 09/13/2020 CLINICAL DATA:  Leg swelling. EXAM: CHEST - 2 VIEW COMPARISON:  11/09/2016 FINDINGS: Cardiac enlargement. No pleural effusion or edema. No airspace opacities identified. Thoracic spondylosis noted. IMPRESSION: 1. Cardiac enlargement. 2. No heart failure. Electronically Signed   By: Kerby Moors M.D.   On: 09/13/2020 12:38   US Venous Img Lower Bilateral (DVT)  Result Date: 09/14/2020 CLINICAL DATA:  Bilateral lower extremity swelling for 1 month EXAM: BILATERAL LOWER EXTREMITY VENOUS DOPPLER ULTRASOUND TECHNIQUE: Gray-scale sonography with compression, as well as color and duplex ultrasound, were performed to evaluate the  deep venous system(s) from the level of the common femoral vein through the popliteal and proximal calf veins. COMPARISON:  None. FINDINGS: VENOUS Normal compressibility of the common femoral, superficial femoral, and popliteal veins, as well as the visualized calf veins. Visualized portions of profunda femoral vein and great saphenous vein unremarkable. No filling defects to suggest DVT on grayscale or color Doppler imaging. Doppler waveforms show normal direction of  venous flow, normal respiratory plasticity and response to augmentation. OTHER None. Limitations: none IMPRESSION: No lower extremity DVT Electronically Signed   By: Miachel Roux M.D.   On: 09/14/2020 11:09   ECHOCARDIOGRAM COMPLETE  Result Date: 09/14/2020    ECHOCARDIOGRAM REPORT   Patient Name:   ALLANTE MOUSEL Date of Exam: 09/14/2020 Medical Rec #:  [id number]           Height:       72.0 in Accession #:    [id number]          Weight:       260.0 lb Date of Birth:  [birth year]            BSA:          2.382 m Patient Age:    65 years            BP:           115/78 mmHg Patient Gender: M                   HR:           59 bpm. Exam Location:  [facility] Procedure: 2D Echo, Cardiac Doppler and Color Doppler Indications:    I26.02 Pulmonary embolus  History:        Patient has prior history of Echocardiogram examinations, most                 recent 05/06/2015. CHF, Arrythmias:Atrial Flutter; Risk                 Factors:Hypertension. Alcohol abuse.  Sonographer:    Jonelle Sidle Dance Referring Phys: Navarro.Ahr Dam Ashraf IMPRESSIONS  1. Right ventricular systolic function is severely reduced. The right ventricular size is severely enlarged. There is moderately elevated pulmonary artery systolic pressure. The estimated right ventricular systolic pressure is 123XX123 mmHg.  2. 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  3. Left ventricular ejection fraction, by estimation, is 45 to 50%. The left ventricle has low normal function. The left ventricle demonstrates global hypokinesis. There is moderate left ventricular hypertrophy. Left ventricular diastolic parameters are  indeterminate. There is the interventricular septum is flattened in systole and diastole, consistent with right ventricular pressure and volume overload.  4. Left atrial size was severely dilated.  5. Right atrial size was severely dilated.  6. The mitral valve is grossly normal. Mild mitral valve regurgitation. No evidence of  mitral stenosis.  7. The aortic valve is tricuspid. There is mild calcification of the aortic valve. Aortic valve regurgitation is trivial. No aortic stenosis is present.  8. Aortic dilatation noted. There is mild dilatation of the ascending aorta, measuring 40 mm.  9. The inferior vena cava is dilated in size with <50% respiratory variability, suggesting right atrial pressure of 15 mmHg. 10. Cannot exclude small PFO. FINDINGS  Left Ventricle: Left ventricular ejection fraction, by estimation, is 45 to 50%. The left ventricle has low normal function. The left ventricle demonstrates global hypokinesis. The left ventricular internal cavity size was normal in  size. There is moderate left ventricular hypertrophy. The interventricular septum is flattened in systole and diastole, consistent with right ventricular pressure and volume overload. Left ventricular diastolic parameters are indeterminate. Right Ventricle: The right ventricular size is severely enlarged. No increase in right ventricular wall thickness. Right ventricular systolic function is severely reduced. There is moderately elevated pulmonary artery systolic pressure. The tricuspid regurgitant velocity is 3.22 m/s, and with an assumed right atrial pressure of 15 mmHg, the estimated right ventricular systolic pressure is 123XX123 mmHg. Left Atrium: Left atrial size was severely dilated. Right Atrium: Right atrial size was severely dilated. Pericardium: There is no evidence of pericardial effusion. Mitral Valve: The mitral valve is grossly normal. Mild mitral valve regurgitation. No evidence of mitral valve stenosis. Tricuspid Valve: The tricuspid valve is normal in structure. Tricuspid valve regurgitation is severe. No evidence of tricuspid stenosis. Aortic Valve: The aortic valve is tricuspid. There is mild calcification of the aortic valve. Aortic valve regurgitation is trivial. No aortic stenosis is present. Pulmonic Valve: The pulmonic valve was grossly  normal. Pulmonic valve regurgitation is mild. No evidence of pulmonic stenosis. Aorta: Aortic dilatation noted. There is mild dilatation of the ascending aorta, measuring 40 mm. Venous: The inferior vena cava is dilated in size with less than 50% respiratory variability, suggesting right atrial pressure of 15 mmHg. IAS/Shunts: Cannot exclude small PFO.  LEFT VENTRICLE PLAX 2D LVIDd:         5.69 cm  Diastology LVIDs:         4.43 cm  LV e' medial:    7.53 cm/s LV PW:         1.43 cm  LV E/e' medial:  9.5 LV IVS:        1.39 cm  LV e' lateral:   15.70 cm/s LVOT diam:     2.10 cm  LV E/e' lateral: 4.5 LV SV:         50 LV SV Index:   21 LVOT Area:     3.46 cm  RIGHT VENTRICLE RV Basal diam:  5.20 cm RV Mid diam:    3.53 cm RV S prime:     8.32 cm/s TAPSE (M-mode): 1.9 cm LEFT ATRIUM              Index       RIGHT ATRIUM           Index LA diam:        6.30 cm  2.64 cm/m  RA Area:     55.20 cm LA Vol (A2C):   176.0 ml 73.89 ml/m RA Volume:   268.00 ml 112.52 ml/m LA Vol (A4C):   125.0 ml 52.48 ml/m LA Biplane Vol: 148.0 ml 62.14 ml/m  AORTIC VALVE LVOT Vmax:   80.60 cm/s LVOT Vmean:  57.400 cm/s LVOT VTI:    0.145 m  AORTA Ao Root diam: 3.80 cm Ao Asc diam:  4.00 cm MITRAL VALVE               TRICUSPID VALVE MV Area (PHT): 2.29 cm    TR Peak grad:   41.5 mmHg MV Decel Time: 331 msec    TR Vmax:        322.00 cm/s MV E velocity: 71.20 cm/s MV A velocity: 31.60 cm/s  SHUNTS MV E/A ratio:  2.25        Systemic VTI:  0.14 m  Systemic Diam: 2.10 cm Cherlynn Kaiser MD Electronically signed by Cherlynn Kaiser MD Signature Date/Time: 09/14/2020/5:35:04 PM    Final         Discharge Exam: Vitals:   09/15/20 2130 09/16/20 0511  BP: 106/71 101/66  Pulse: (!) 56 60  Resp: 18 18  Temp: (!) 97.5 F (36.4 C) (!) 97.3 F (36.3 C)  SpO2:     Vitals:   09/15/20 1326 09/15/20 1329 09/15/20 2130 09/16/20 0511  BP: 95/67 109/77 106/71 101/66  Pulse: (!) 59  (!) 56 60  Resp: '18  18 18   '$ Temp: 98.1 F (36.7 C)  (!) 97.5 F (36.4 C) (!) 97.3 F (36.3 C)  TempSrc:   Oral Axillary  SpO2: 100%     Weight:      Height:        General: Pt is alert, awake, not in acute distress Cardiovascular: RRR, S1/S2 +, no rubs, no gallops Respiratory: CTA bilaterally, no wheezing, no rhonchi Abdominal: Soft, NT, ND, bowel sounds + Extremities: no edema, no cyanosis   The results of significant diagnostics from this hospitalization (including imaging, microbiology, ancillary and laboratory) are listed below for reference.    Significant Diagnostic Studies: DG Chest 2 View  Result Date: 09/13/2020 CLINICAL DATA:  Leg swelling. EXAM: CHEST - 2 VIEW COMPARISON:  11/09/2016 FINDINGS: Cardiac enlargement. No pleural effusion or edema. No airspace opacities identified. Thoracic spondylosis noted. IMPRESSION: 1. Cardiac enlargement. 2. No heart failure. Electronically Signed   By: Kerby Moors M.D.   On: 09/13/2020 12:38   US Venous Img Lower Bilateral (DVT)  Result Date: 09/14/2020 CLINICAL DATA:  Bilateral lower extremity swelling for 1 month EXAM: BILATERAL LOWER EXTREMITY VENOUS DOPPLER ULTRASOUND TECHNIQUE: Gray-scale sonography with compression, as well as color and duplex ultrasound, were performed to evaluate the deep venous system(s) from the level of the common femoral vein through the popliteal and proximal calf veins. COMPARISON:  None. FINDINGS: VENOUS Normal compressibility of the common femoral, superficial femoral, and popliteal veins, as well as the visualized calf veins. Visualized portions of profunda femoral vein and great saphenous vein unremarkable. No filling defects to suggest DVT on grayscale or color Doppler imaging. Doppler waveforms show normal direction of venous flow, normal respiratory plasticity and response to augmentation. OTHER None. Limitations: none IMPRESSION: No lower extremity DVT Electronically Signed   By: Miachel Roux M.D.   On: 09/14/2020 11:09    ECHOCARDIOGRAM COMPLETE  Result Date: 09/14/2020    ECHOCARDIOGRAM REPORT   Patient Name:   JANARI MANDELLA Date of Exam: 09/14/2020 Medical Rec #:  [id number]           Height:       72.0 in Accession #:    [id number]          Weight:       260.0 lb Date of Birth:  1955-12-11            BSA:          2.382 m Patient Age:    36 years            BP:           115/78 mmHg Patient Gender: M                   HR:           59 bpm. Exam Location:  [facility] Procedure: 2D Echo, Cardiac Doppler and Color Doppler Indications:    I26.02  Pulmonary embolus  History:        Patient has prior history of Echocardiogram examinations, most                 recent 05/06/2015. CHF, Arrythmias:Atrial Flutter; Risk                 Factors:Hypertension. Alcohol abuse.  Sonographer:    Jonelle Sidle Dance Referring Phys: Navarro.Ahr Dalyn Kjos IMPRESSIONS  1. Right ventricular systolic function is severely reduced. The right ventricular size is severely enlarged. There is moderately elevated pulmonary artery systolic pressure. The estimated right ventricular systolic pressure is 123XX123 mmHg.  2. 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  3. Left ventricular ejection fraction, by estimation, is 45 to 50%. The left ventricle has low normal function. The left ventricle demonstrates global hypokinesis. There is moderate left ventricular hypertrophy. Left ventricular diastolic parameters are  indeterminate. There is the interventricular septum is flattened in systole and diastole, consistent with right ventricular pressure and volume overload.  4. Left atrial size was severely dilated.  5. Right atrial size was severely dilated.  6. The mitral valve is grossly normal. Mild mitral valve regurgitation. No evidence of mitral stenosis.  7. The aortic valve is tricuspid. There is mild calcification of the aortic valve. Aortic valve regurgitation is trivial. No aortic stenosis is present.  8. Aortic dilatation noted. There is  mild dilatation of the ascending aorta, measuring 40 mm.  9. The inferior vena cava is dilated in size with <50% respiratory variability, suggesting right atrial pressure of 15 mmHg. 10. Cannot exclude small PFO. FINDINGS  Left Ventricle: Left ventricular ejection fraction, by estimation, is 45 to 50%. The left ventricle has low normal function. The left ventricle demonstrates global hypokinesis. The left ventricular internal cavity size was normal in size. There is moderate left ventricular hypertrophy. The interventricular septum is flattened in systole and diastole, consistent with right ventricular pressure and volume overload. Left ventricular diastolic parameters are indeterminate. Right Ventricle: The right ventricular size is severely enlarged. No increase in right ventricular wall thickness. Right ventricular systolic function is severely reduced. There is moderately elevated pulmonary artery systolic pressure. The tricuspid regurgitant velocity is 3.22 m/s, and with an assumed right atrial pressure of 15 mmHg, the estimated right ventricular systolic pressure is 123XX123 mmHg. Left Atrium: Left atrial size was severely dilated. Right Atrium: Right atrial size was severely dilated. Pericardium: There is no evidence of pericardial effusion. Mitral Valve: The mitral valve is grossly normal. Mild mitral valve regurgitation. No evidence of mitral valve stenosis. Tricuspid Valve: The tricuspid valve is normal in structure. Tricuspid valve regurgitation is severe. No evidence of tricuspid stenosis. Aortic Valve: The aortic valve is tricuspid. There is mild calcification of the aortic valve. Aortic valve regurgitation is trivial. No aortic stenosis is present. Pulmonic Valve: The pulmonic valve was grossly normal. Pulmonic valve regurgitation is mild. No evidence of pulmonic stenosis. Aorta: Aortic dilatation noted. There is mild dilatation of the ascending aorta, measuring 40 mm. Venous: The inferior vena cava is  dilated in size with less than 50% respiratory variability, suggesting right atrial pressure of 15 mmHg. IAS/Shunts: Cannot exclude small PFO.  LEFT VENTRICLE PLAX 2D LVIDd:         5.69 cm  Diastology LVIDs:         4.43 cm  LV e' medial:    7.53 cm/s LV PW:         1.43 cm  LV E/e' medial:  9.5 LV IVS:        1.39 cm  LV e' lateral:   15.70 cm/s LVOT diam:     2.10 cm  LV E/e' lateral: 4.5 LV SV:         50 LV SV Index:   21 LVOT Area:     3.46 cm  RIGHT VENTRICLE RV Basal diam:  5.20 cm RV Mid diam:    3.53 cm RV S prime:     8.32 cm/s TAPSE (M-mode): 1.9 cm LEFT ATRIUM              Index       RIGHT ATRIUM           Index LA diam:        6.30 cm  2.64 cm/m  RA Area:     55.20 cm LA Vol (A2C):   176.0 ml 73.89 ml/m RA Volume:   268.00 ml 112.52 ml/m LA Vol (A4C):   125.0 ml 52.48 ml/m LA Biplane Vol: 148.0 ml 62.14 ml/m  AORTIC VALVE LVOT Vmax:   80.60 cm/s LVOT Vmean:  57.400 cm/s LVOT VTI:    0.145 m  AORTA Ao Root diam: 3.80 cm Ao Asc diam:  4.00 cm MITRAL VALVE               TRICUSPID VALVE MV Area (PHT): 2.29 cm    TR Peak grad:   41.5 mmHg MV Decel Time: 331 msec    TR Vmax:        322.00 cm/s MV E velocity: 71.20 cm/s MV A velocity: 31.60 cm/s  SHUNTS MV E/A ratio:  2.25        Systemic VTI:  0.14 m                            Systemic Diam: 2.10 cm Cherlynn Kaiser MD Electronically signed by Cherlynn Kaiser MD Signature Date/Time: 09/14/2020/5:35:04 PM    Final     Microbiology: Recent Results (from the past 240 hour(s))  Resp Panel by RT-PCR (Flu A&B, Covid) Nasopharyngeal Swab     Status: None   Collection Time: 09/14/20 11:52 AM   Specimen: Nasopharyngeal Swab; Nasopharyngeal(NP) swabs in vial transport medium  Result Value Ref Range Status   SARS Coronavirus 2 by RT PCR NEGATIVE NEGATIVE Final    Comment: (NOTE) SARS-CoV-2 target nucleic acids are NOT DETECTED.  The SARS-CoV-2 RNA is generally detectable in upper respiratory specimens during the acute phase of infection. The  lowest concentration of SARS-CoV-2 viral copies this assay can detect is 138 copies/mL. A negative result does not preclude SARS-Cov-2 infection and should not be used as the sole basis for treatment or other patient management decisions. A negative result may occur with  improper specimen collection/handling, submission of specimen other than nasopharyngeal swab, presence of viral mutation(s) within the areas targeted by this assay, and inadequate number of viral copies(<138 copies/mL). A negative result must be combined with clinical observations, patient history, and epidemiological information. The expected result is Negative.  Fact Sheet for Patients:  EntrepreneurPulse.com.au  Fact Sheet for Healthcare Providers:  IncredibleEmployment.be  This test is no t yet approved or cleared by the [place] FDA and  has been authorized for detection and/or diagnosis of SARS-CoV-2 by FDA under an Emergency Use Authorization (EUA). This EUA will remain  in effect (meaning this test can be used) for the duration of the COVID-19 declaration under Section 564(b)(1) of  the Act, 21 U.S.C.section 360bbb-3(b)(1), unless the authorization is terminated  or revoked sooner.       Influenza A by PCR NEGATIVE NEGATIVE Final   Influenza B by PCR NEGATIVE NEGATIVE Final    Comment: (NOTE) The Xpert Xpress SARS-CoV-2/FLU/RSV plus assay is intended as an aid in the diagnosis of influenza from Nasopharyngeal swab specimens and should not be used as a sole basis for treatment. Nasal washings and aspirates are unacceptable for Xpert Xpress SARS-CoV-2/FLU/RSV testing.  Fact Sheet for Patients: EntrepreneurPulse.com.au  Fact Sheet for Healthcare Providers: IncredibleEmployment.be  This test is not yet approved or cleared by the [place] FDA and has been authorized for detection and/or diagnosis of SARS-CoV-2 by FDA under  an Emergency Use Authorization (EUA). This EUA will remain in effect (meaning this test can be used) for the duration of the COVID-19 declaration under Section 564(b)(1) of the Act, 21 U.S.C. section 360bbb-3(b)(1), unless the authorization is terminated or revoked.  Performed at [facility], [address], [place], [place] [address]      Labs: Basic Metabolic Panel: Recent Labs  Lab 09/13/20 1213 09/15/20 0223 09/16/20 0503  NA 142 140 138  K 3.6 3.7 3.7  CL 105 102 101  CO2 '31 30 29  '$ GLUCOSE 102* 113* 94  BUN 23 24* 23  CREATININE 1.20 1.32* 1.42*  CALCIUM 8.6* 8.8* 8.4*  MG  --   --  2.4   Liver Function Tests: Recent Labs  Lab 09/13/20 1412  AST 19  ALT 13  ALKPHOS 61  BILITOT 0.7  PROT 6.1*  ALBUMIN 3.2*   No results for input(s): LIPASE, AMYLASE in the last 168 hours. No results for input(s): AMMONIA in the last 168 hours. CBC: Recent Labs  Lab 09/13/20 1213 09/15/20 0223 09/16/20 0503  WBC 5.4 6.1 6.5  HGB 7.3* 7.5* 7.7*  HCT 24.1* 25.5* 26.1*  MCV 97.6 95.9 96.0  PLT 231 268 277   Cardiac Enzymes: No results for input(s): CKTOTAL, CKMB, CKMBINDEX, TROPONINI in the last 168 hours. BNP: Invalid input(s): POCBNP CBG: Recent Labs  Lab 09/15/20 1836 09/15/20 2127 09/16/20 0750 09/16/20 1006 09/16/20 1127  GLUCAP 76 97 56* 86 71    Time coordinating discharge:  36 minutes  Signed:  Orson Eva, DO [facility] Pager: [phone] 09/16/2020, 12:00 PM

## 2020-09-16 NOTE — Telephone Encounter (Signed)
Patient is getting discharged from the hospital today. We will arrange CBC in 1 week and arrange follow-up office visit as well.   Dena, patient needs CBC in 1 week. Dx: iron deficiency anemia, hematochezia, melena.   Stacey: Please arrange hospital follow-up with [facility] or Dr. Abbey Chatters in 4 weeks. Dx: IDA, anal mass, hematochezia, melena.

## 2020-09-17 ENCOUNTER — Other Ambulatory Visit: Payer: Self-pay | Admitting: *Deleted

## 2020-09-17 ENCOUNTER — Encounter (HOSPITAL_COMMUNITY): Payer: Self-pay | Admitting: Internal Medicine

## 2020-09-17 DIAGNOSIS — D509 Iron deficiency anemia, unspecified: Secondary | ICD-10-CM

## 2020-09-17 LAB — SURGICAL PATHOLOGY

## 2020-09-17 NOTE — Telephone Encounter (Signed)
Spoke to pt. Informed him of labs for next week. Pt voiced understanding. Requested send them to [facility]

## 2020-09-21 ENCOUNTER — Encounter (HOSPITAL_COMMUNITY): Payer: Self-pay | Admitting: Emergency Medicine

## 2020-09-21 ENCOUNTER — Other Ambulatory Visit: Payer: Self-pay

## 2020-09-21 ENCOUNTER — Inpatient Hospital Stay (HOSPITAL_COMMUNITY)
Admission: EM | Admit: 2020-09-21 | Discharge: 2020-09-29 | DRG: 291 | Disposition: A | Discharge status: Home / Self Care | Payer: Medicare Other | Attending: Internal Medicine | Admitting: Internal Medicine

## 2020-09-21 ENCOUNTER — Emergency Department (HOSPITAL_COMMUNITY): Payer: Medicare Other

## 2020-09-21 DIAGNOSIS — N5089 Other specified disorders of the male genital organs: Secondary | ICD-10-CM | POA: Diagnosis present

## 2020-09-21 DIAGNOSIS — I4891 Unspecified atrial fibrillation: Secondary | ICD-10-CM | POA: Diagnosis not present

## 2020-09-21 DIAGNOSIS — E1122 Type 2 diabetes mellitus with diabetic chronic kidney disease: Secondary | ICD-10-CM | POA: Diagnosis present

## 2020-09-21 DIAGNOSIS — I959 Hypotension, unspecified: Secondary | ICD-10-CM | POA: Diagnosis present

## 2020-09-21 DIAGNOSIS — Z79899 Other long term (current) drug therapy: Secondary | ICD-10-CM | POA: Diagnosis not present

## 2020-09-21 DIAGNOSIS — I483 Typical atrial flutter: Secondary | ICD-10-CM | POA: Diagnosis not present

## 2020-09-21 DIAGNOSIS — J9601 Acute respiratory failure with hypoxia: Secondary | ICD-10-CM

## 2020-09-21 DIAGNOSIS — N1831 Chronic kidney disease, stage 3a: Secondary | ICD-10-CM | POA: Diagnosis present

## 2020-09-21 DIAGNOSIS — I509 Heart failure, unspecified: Secondary | ICD-10-CM | POA: Diagnosis present

## 2020-09-21 DIAGNOSIS — E876 Hypokalemia: Secondary | ICD-10-CM | POA: Diagnosis present

## 2020-09-21 DIAGNOSIS — I5043 Acute on chronic combined systolic (congestive) and diastolic (congestive) heart failure: Secondary | ICD-10-CM | POA: Diagnosis present

## 2020-09-21 DIAGNOSIS — E873 Alkalosis: Secondary | ICD-10-CM | POA: Diagnosis present

## 2020-09-21 DIAGNOSIS — I10 Essential (primary) hypertension: Secondary | ICD-10-CM | POA: Diagnosis not present

## 2020-09-21 DIAGNOSIS — I4821 Permanent atrial fibrillation: Secondary | ICD-10-CM | POA: Diagnosis present

## 2020-09-21 DIAGNOSIS — I248 Other forms of acute ischemic heart disease: Secondary | ICD-10-CM | POA: Diagnosis present

## 2020-09-21 DIAGNOSIS — Z7901 Long term (current) use of anticoagulants: Secondary | ICD-10-CM

## 2020-09-21 DIAGNOSIS — I5041 Acute combined systolic (congestive) and diastolic (congestive) heart failure: Secondary | ICD-10-CM

## 2020-09-21 DIAGNOSIS — R778 Other specified abnormalities of plasma proteins: Secondary | ICD-10-CM | POA: Diagnosis not present

## 2020-09-21 DIAGNOSIS — Z86711 Personal history of pulmonary embolism: Secondary | ICD-10-CM

## 2020-09-21 DIAGNOSIS — I480 Paroxysmal atrial fibrillation: Secondary | ICD-10-CM | POA: Diagnosis present

## 2020-09-21 DIAGNOSIS — D509 Iron deficiency anemia, unspecified: Secondary | ICD-10-CM | POA: Diagnosis present

## 2020-09-21 DIAGNOSIS — Z20822 Contact with and (suspected) exposure to covid-19: Secondary | ICD-10-CM | POA: Diagnosis present

## 2020-09-21 DIAGNOSIS — I5082 Biventricular heart failure: Secondary | ICD-10-CM | POA: Diagnosis present

## 2020-09-21 DIAGNOSIS — M10071 Idiopathic gout, right ankle and foot: Secondary | ICD-10-CM | POA: Diagnosis present

## 2020-09-21 DIAGNOSIS — E785 Hyperlipidemia, unspecified: Secondary | ICD-10-CM | POA: Diagnosis present

## 2020-09-21 DIAGNOSIS — I482 Chronic atrial fibrillation, unspecified: Secondary | ICD-10-CM | POA: Diagnosis not present

## 2020-09-21 DIAGNOSIS — I440 Atrioventricular block, first degree: Secondary | ICD-10-CM | POA: Diagnosis present

## 2020-09-21 DIAGNOSIS — I130 Hypertensive heart and chronic kidney disease with heart failure and stage 1 through stage 4 chronic kidney disease, or unspecified chronic kidney disease: Secondary | ICD-10-CM | POA: Diagnosis not present

## 2020-09-21 DIAGNOSIS — N183 Chronic kidney disease, stage 3 unspecified: Secondary | ICD-10-CM | POA: Diagnosis present

## 2020-09-21 DIAGNOSIS — R7989 Other specified abnormal findings of blood chemistry: Secondary | ICD-10-CM

## 2020-09-21 LAB — CBC WITH DIFFERENTIAL/PLATELET
Abs Immature Granulocytes: 0.03 10*3/uL (ref 0.00–0.07)
Basophils Absolute: 0 10*3/uL (ref 0.0–0.1)
Basophils Relative: 1 %
Eosinophils Absolute: 0.1 10*3/uL (ref 0.0–0.5)
Eosinophils Relative: 1 %
HCT: 26.3 % — ABNORMAL LOW (ref 39.0–52.0)
Hemoglobin: 7.6 g/dL — ABNORMAL LOW (ref 13.0–17.0)
Immature Granulocytes: 1 %
Lymphocytes Relative: 14 %
Lymphs Abs: 0.9 10*3/uL (ref 0.7–4.0)
MCH: 27.9 pg (ref 26.0–34.0)
MCHC: 28.9 g/dL — ABNORMAL LOW (ref 30.0–36.0)
MCV: 96.7 fL (ref 80.0–100.0)
Monocytes Absolute: 0.7 10*3/uL (ref 0.1–1.0)
Monocytes Relative: 11 %
Neutro Abs: 4.7 10*3/uL (ref 1.7–7.7)
Neutrophils Relative %: 72 %
Platelets: 233 10*3/uL (ref 150–400)
RBC: 2.72 MIL/uL — ABNORMAL LOW (ref 4.22–5.81)
RDW: 16.6 % — ABNORMAL HIGH (ref 11.5–15.5)
WBC: 6.5 10*3/uL (ref 4.0–10.5)
nRBC: 0 % (ref 0.0–0.2)

## 2020-09-21 LAB — COMPREHENSIVE METABOLIC PANEL
ALT: 18 U/L (ref 0–44)
AST: 21 U/L (ref 15–41)
Albumin: 2.9 g/dL — ABNORMAL LOW (ref 3.5–5.0)
Alkaline Phosphatase: 70 U/L (ref 38–126)
Anion gap: 4 — ABNORMAL LOW (ref 5–15)
BUN: 8 mg/dL (ref 8–23)
CO2: 34 mmol/L — ABNORMAL HIGH (ref 22–32)
Calcium: 8.4 mg/dL — ABNORMAL LOW (ref 8.9–10.3)
Chloride: 100 mmol/L (ref 98–111)
Creatinine, Ser: 1.19 mg/dL (ref 0.61–1.24)
GFR, Estimated: >60 mL/min (ref >60)
Glucose, Bld: 111 mg/dL — ABNORMAL HIGH (ref 70–99)
Potassium: 4.1 mmol/L (ref 3.5–5.1)
Sodium: 138 mmol/L (ref 135–145)
Total Bilirubin: 0.4 mg/dL (ref 0.3–1.2)
Total Protein: 5.9 g/dL — ABNORMAL LOW (ref 6.5–8.1)

## 2020-09-21 LAB — TROPONIN I (HIGH SENSITIVITY)
Troponin I (High Sensitivity): 21 ng/L — ABNORMAL HIGH (ref <18)
Troponin I (High Sensitivity): 20 ng/L — ABNORMAL HIGH (ref <18)

## 2020-09-21 LAB — BRAIN NATRIURETIC PEPTIDE: B Natriuretic Peptide: 893 pg/mL — ABNORMAL HIGH (ref 0.0–100.0)

## 2020-09-21 MED ORDER — FUROSEMIDE 10 MG/ML IJ SOLN
40.0000 mg | Freq: Once | INTRAMUSCULAR | Status: AC
  Administered 2020-09-21: 40 mg via INTRAVENOUS
  Filled 2020-09-21: qty 4

## 2020-09-21 NOTE — ED Triage Notes (Signed)
Pt reports bilateral leg swelling and SOB x 2 months.  Denies chest pain.  States he saw PCP on Friday.

## 2020-09-21 NOTE — ED Provider Notes (Signed)
[place] EMERGENCY DEPARTMENT Provider Note   CSN: [id number] Arrival date & time: 09/21/20  1455     History No chief complaint on file.   Dennis Swanson is a 65 y.o. male with a past medical history significant for diabetes, A. fib on Eliquis, hypertension, hyperlipidemia, systolic and diastolic congestive heart failure who presents to the ED due to persistent lower extremity edema.  Nephew at bedside notes that patient has become more short of breath with exertion.  Patient admits to intermittent shortness of breath.  No associated chest pain.  Chart reviewed.  Patient was recently admitted the hospital on 7/29-8/1 due to lower extremity edema and dyspnea.  Patient was found to have symptomatic anemia with hemoglobin at 7.3.  Colonoscopy demonstrated anal mass, hemorrhoids, diverticulosis.  EGD demonstrated gastritis.  Patient also had a echocardiogram which demonstrated an EF of 45 to 50%.  Lower extremity ultrasounds at that time were negative for any DVTs.  Patient has been compliant with his Eliquis and Lasix.  Patient states his lower extremity edema has not worsened since discharge however, notes it has not resolved which prompted him to report to the ED. Patient denies cough, fever, chills. No abdominal pain, nausea, vomiting, or diarrhea.  History obtained from patient and past medical records. No interpreter used during encounter.       Past Medical History:  Diagnosis Date   Acute pulmonary embolism ([place]) 2011   Alcohol abuse    Atrial flutter (HCC)    CHF (congestive heart failure) ([place])    Hypertension    Lower extremity cellulitis    bilateral   Systolic dysfunction     Patient Active Problem List   Diagnosis Date Noted   Acute CHF (congestive heart failure) ([place]) 09/21/2020   Mass of anus    Symptomatic anemia 09/14/2020   CKD (chronic kidney disease) stage 3, GFR 30-59 ml/min (HCC) 0000000   Systolic and diastolic CHF, chronic ([place])  09/14/2020   Proteinuria    Gastrointestinal hemorrhage    Anemia 09/13/2020   Chronic diastolic CHF (congestive heart failure) ([place]) 09/13/2020   Obesity (BMI 30.0-34.9) 09/13/2020   Rectal bleeding 02/24/2017   Morbid obesity ([place]) 02/24/2017   Acute exacerbation of CHF (congestive heart failure) ([place]) 12/06/2015   Encounter for therapeutic drug monitoring 04/10/2015   Atrial fibrillation ([place]) 04/03/2015   Nonischemic cardiomyopathy ([place]) 04/03/2015   Current use of long term anticoagulation 04/03/2015   Colon cancer screening 04/27/2012   Essential hypertension 04/27/2012   CHF 12/17/2009   ALCOHOL ABUSE 09/27/2009    Past Surgical History:  Procedure Laterality Date   COLONOSCOPY N/A 05/17/2012   Procedure: COLONOSCOPY;  Surgeon: Danie Binder, MD;  [facility];  Service: Endoscopy;  Laterality: N/A;  10:30   COLONOSCOPY WITH PROPOFOL N/A 09/15/2020   Procedure: COLONOSCOPY WITH PROPOFOL;  Surgeon: Eloise Harman, DO;  Location: [facility];  Service: Endoscopy;  Laterality: N/A;   ESOPHAGOGASTRODUODENOSCOPY (EGD) WITH PROPOFOL N/A 09/15/2020   Procedure: ESOPHAGOGASTRODUODENOSCOPY (EGD) WITH PROPOFOL;  Surgeon: Eloise Harman, DO;  Location: [facility];  Service: Endoscopy;  Laterality: N/A;   None         Family History  Problem Relation Age of Onset   Heart attack Mother    Hypertension Brother    Diabetes Brother    Cancer Father    Colon cancer Neg Hx     Social History   Tobacco Use   Smoking status: Never  Smokeless tobacco: Never  Vaping Use   Vaping Use: Never used  Substance Use Topics   Alcohol use: Yes    Alcohol/week: 3.0 standard drinks    Types: 3 Cans of beer per week    Comment: once a week   Drug use: No    Home Medications Prior to Admission medications   Medication Sig Start Date End Date Taking? Authorizing Provider  apixaban (ELIQUIS) 5 MG TABS tablet Take 1 tablet (5 mg total) by mouth 2 (two) times daily.  09/17/20  Yes Tat, Shanon Brow, MD  carvedilol (COREG) 3.125 MG tablet Take 1 tablet (3.125 mg total) by mouth 2 (two) times daily with a meal. 09/17/20  Yes Tat, Shanon Brow, MD  furosemide (LASIX) 40 MG tablet TAKE ONE TABLET BY MOUTH IN THE MORNING AND ONE-HALF IN THE EVENING Patient taking differently: Take 40 mg by mouth 2 (two) times daily. 11/02/16  Yes Branch, Alphonse Guild, MD  hydrocortisone (ANUSOL-HC) 25 MG suppository Place 1 suppository (25 mg total) rectally 2 (two) times daily. 09/13/20  Yes Idol, Almyra Free, PA-C  pantoprazole (PROTONIX) 40 MG tablet Take 1 tablet (40 mg total) by mouth daily. 09/17/20  Yes TatShanon Brow, MD  ferrous sulfate 325 (65 FE) MG tablet Take 1 tablet (325 mg total) by mouth daily with breakfast. Patient not taking: Reported on 09/21/2020 09/17/20   Orson Eva, MD    Allergies    Patient has no known allergies.  Review of Systems   Review of Systems  Constitutional:  Negative for chills and fever.  Respiratory:  Positive for shortness of breath.   Cardiovascular:  Positive for leg swelling. Negative for chest pain.  Gastrointestinal:  Negative for abdominal distention, abdominal pain, diarrhea, nausea and vomiting.  All other systems reviewed and are negative.  Physical Exam Updated Vital Signs BP (!) 140/99   Pulse 75   Temp 98 F (36.7 C)   Resp (!) 28   SpO2 99%   Physical Exam Vitals and nursing note reviewed.  Constitutional:      General: He is not in acute distress.    Appearance: He is not ill-appearing.  HENT:     Head: Normocephalic.  Eyes:     Pupils: Pupils are equal, round, and reactive to light.  Cardiovascular:     Rate and Rhythm: Normal rate and regular rhythm.     Pulses: Normal pulses.     Heart sounds: Normal heart sounds. No murmur heard.   No friction rub. No gallop.  Pulmonary:     Effort: Pulmonary effort is normal.     Breath sounds: Normal breath sounds.  Abdominal:     General: Abdomen is flat. There is no distension.     Palpations:  Abdomen is soft.     Tenderness: There is no abdominal tenderness. There is no guarding or rebound.  Musculoskeletal:        General: Normal range of motion.     Cervical back: Neck supple.     Comments: 2+ pitting edema bilaterally,  Skin:    General: Skin is warm and dry.  Neurological:     General: No focal deficit present.     Mental Status: He is alert.  Psychiatric:        Mood and Affect: Mood normal.        Behavior: Behavior normal.    ED Results / Procedures / Treatments   Labs (all labs ordered are listed, but only abnormal results are displayed) Labs Reviewed  CBC WITH DIFFERENTIAL/PLATELET - Abnormal; Notable for the following components:      Result Value   RBC 2.72 (*)    Hemoglobin 7.6 (*)    HCT 26.3 (*)    MCHC 28.9 (*)    RDW 16.6 (*)    All other components within normal limits  COMPREHENSIVE METABOLIC PANEL - Abnormal; Notable for the following components:   CO2 34 (*)    Glucose, Bld 111 (*)    Calcium 8.4 (*)    Total Protein 5.9 (*)    Albumin 2.9 (*)    Anion gap 4 (*)    All other components within normal limits  BRAIN NATRIURETIC PEPTIDE - Abnormal; Notable for the following components:   B Natriuretic Peptide 893.0 (*)    All other components within normal limits  TROPONIN I (HIGH SENSITIVITY) - Abnormal; Notable for the following components:   Troponin I (High Sensitivity) 21 (*)    All other components within normal limits  TROPONIN I (HIGH SENSITIVITY) - Abnormal; Notable for the following components:   Troponin I (High Sensitivity) 20 (*)    All other components within normal limits  RESP PANEL BY RT-PCR (FLU A&B, COVID) [id number]    EKG EKG Interpretation  Date/Time:  Saturday September 21 2020 15:18:40 EDT Ventricular Rate:  78 PR Interval:    QRS Duration: 102 QT Interval:  390 QTC Calculation: 444 R Axis:   15 Text Interpretation: Normal sinus rhythm Incomplete right bundle branch block Borderline ECG Confirmed by Sherwood Gambler  [phone]) on 09/21/2020 7:34:35 PM  Radiology DG Chest 2 View  Result Date: 09/21/2020 CLINICAL DATA:  Dyspnea EXAM: CHEST - 2 VIEW COMPARISON:  09/13/2020 FINDINGS: Lungs are clear. Mild eventration of the right hemidiaphragm is unchanged. No pneumothorax or pleural effusion. Mild cardiomegaly is stable. Pulmonary vascularity is normal. No acute bone abnormality. Degenerative changes are seen within the right shoulder and thoracic spine. IMPRESSION: No active cardiopulmonary disease.  Stable cardiomegaly. Electronically Signed   By: Fidela Salisbury MD   On: 09/21/2020 15:55    Procedures Procedures   Medications Ordered in ED Medications  furosemide (LASIX) injection 40 mg (40 mg Intravenous Given 09/21/20 2112)    ED Course  I have reviewed the triage vital signs and the nursing notes.  Pertinent labs & imaging results that were available during my care of the patient were reviewed by me and considered in my medical decision making (see chart for details).    MDM Rules/Calculators/A&P                          65 year old male presents to the ED due to bilateral lower extremity edema that has been present for numerous weeks.  Patient recently admitted to the hospital and discharged on 8/1 for the same complaint.  Patient discharged with Lasix which he has been compliant with.  Patient had bilateral lower extremity ultrasounds performed during his admission which were negative for DVTs.  Patient states lower extremity edema has not worsened however, has not resolved which prompted him to report to the ED.  Patient also endorses worsening shortness of breath with exertion.  No associated chest pain.  Upon arrival, vitals all within normal limits.  Patient is afebrile, not tachycardic or hypoxic.  Patient in no acute distress.  2+ pitting edema bilaterally. Labs ordered at triage. IV lasix given.  CBC significant for anemia with hemoglobin at 7.6 which appears to be similar to a  few days ago.   Patient had a endoscopy and colonoscopy during his recent admission which was negative for GI bleed.  Patient denies melena, hematochezia, and hematemesis.  CMP with normal renal function.  BNP elevated at 893.  Troponin elevated 21 likely due to CHF.  Will obtain delta troponin to rule out ACS.  Chest x-ray demonstrates stable cardiomegaly, but no signs of pneumonia or PTX.   Delta troponin flat. Low suspicion for ACS. Patient ambulated here in the ED with a drop in O2 saturation to 88%. Will consult hospitalist for admission for CHF exacerbation.  Discussed with Dr. Marlowe Sax with [facility] who agrees to admit patient. COVID test ordered. Final Clinical Impression(s) / ED Diagnoses Final diagnoses:  Acute on chronic congestive heart failure, unspecified heart failure type [facility])    Rx / DC Orders ED Discharge Orders     None        Karie Kirks 09/21/20 2242    Sherwood Gambler, MD 09/22/20 2342

## 2020-09-21 NOTE — ED Provider Notes (Signed)
Emergency Medicine Provider Triage Evaluation Note  Dennis Swanson , a 65 y.o. male  was evaluated in triage.  Pt complains of lower extremity edema and shortness of breath for the last 2 months.  Patient has a history of heart failure, was seen by his PCP and had x-rays of his legs done which were unremarkable.  He continues to have leg swelling and shortness of breath which is prompted him to come to the ER.  He is on diuretics and states he is compliant with them.  Denies any chest pain, cough, fevers or chills.  He is compliant with Eliquis  Review of Systems  Positive: As above Negative: As above  Physical Exam  There were no vitals taken for this visit. Gen:   Awake, no distress   Resp:  Normal effort  MSK:   Moves extremities without difficulty  Other:  Plus lower extremity edema bilaterally  Medical Decision Making  Medically screening exam initiated at 3:11 PM.  Appropriate orders placed.  Dennis Swanson was informed that the remainder of the evaluation will be completed by another provider, this initial triage assessment does not replace that evaluation, and the importance of remaining in the ED until their evaluation is complete.     Garald Balding, PA-C 09/21/20 1518    Regan Lemming, MD 09/21/20 2146

## 2020-09-21 NOTE — H&P (Signed)
History and Physical    Dennis Swanson [id number] DOB: September 07, 1955 DOA: 09/21/2020  PCP: Pcp, No Patient coming from: Home  Chief Complaint: Shortness of breath  HPI: Dennis Swanson is a 65 y.o. male with medical history significant of chronic combined CHF, A. fib on Eliquis, type 2 diabetes, hypertension, hyperlipidemia, CKD stage IIIa, obesity, history of PE in 2011, anal mass, hemorrhoids, diverticulosis, gastritis, iron deficiency anemia, recently admitted for GI bleed.  He presents to the ED with complaints of shortness of breath and bilateral lower extremity edema.  Not hypoxic at rest but desatted to 88% with ambulation.  Labs showing WBC 6.5, hemoglobin 7.6 (stable), platelet count 233.  Sodium 138, potassium 4.1, chloride 100, bicarb 34, BUN 8, creatinine 1.1, glucose 111.  BNP elevated at 893.  High-sensitivity troponin borderline elevated but stable (20 > 21).  EKG without acute changes.  COVID and influenza PCR pending.  Chest x-ray showing stable cardiomegaly and no active cardiopulmonary disease. Patient was given IV Lasix 40 mg.  Patient states his legs have been swollen for the past 3 months and they did not pull off fluid from his legs when he was recently in the hospital.  Denies any dyspnea at rest but does endorse dyspnea with exertion.  Denies chest pain.  He is taking Lasix 40 mg twice daily and has not missed any doses.   He drinks 5-6 bottles of water daily and eats canned foods.  He is no longer having any blood in his stools or any dark stools.  He has not vomited blood.  Review of Systems:  All systems reviewed and apart from history of presenting illness, are negative.  Past Medical History:  Diagnosis Date   Acute pulmonary embolism ([place]) 2011   Alcohol abuse    Atrial flutter (HCC)    CHF (congestive heart failure) ([place])    Hypertension    Lower extremity cellulitis    bilateral   Systolic dysfunction     Past Surgical History:  Procedure  Laterality Date   COLONOSCOPY N/A 05/17/2012   Procedure: COLONOSCOPY;  Surgeon: Danie Binder, MD;  [facility];  Service: Endoscopy;  Laterality: N/A;  10:30   COLONOSCOPY WITH PROPOFOL N/A 09/15/2020   Procedure: COLONOSCOPY WITH PROPOFOL;  Surgeon: Eloise Harman, DO;  [facility];  Service: Endoscopy;  Laterality: N/A;   ESOPHAGOGASTRODUODENOSCOPY (EGD) WITH PROPOFOL N/A 09/15/2020   Procedure: ESOPHAGOGASTRODUODENOSCOPY (EGD) WITH PROPOFOL;  Surgeon: Eloise Harman, DO;  [facility];  Service: Endoscopy;  Laterality: N/A;   None       reports that he has never smoked. He has never used smokeless tobacco. He reports current alcohol use of about 3.0 standard drinks of alcohol per week. He reports that he does not use drugs.  No Known Allergies  Family History  Problem Relation Age of Onset   Heart attack Mother    Hypertension Brother    Diabetes Brother    Cancer Father    Colon cancer Neg Hx     Prior to Admission medications   Medication Sig Start Date End Date Taking? Authorizing Provider  apixaban (ELIQUIS) 5 MG TABS tablet Take 1 tablet (5 mg total) by mouth 2 (two) times daily. 09/17/20  Yes Tat, Shanon Brow, MD  carvedilol (COREG) 3.125 MG tablet Take 1 tablet (3.125 mg total) by mouth 2 (two) times daily with a meal. 09/17/20  Yes Tat, Shanon Brow, MD  furosemide (LASIX) 40 MG tablet TAKE  ONE TABLET BY MOUTH IN THE MORNING AND ONE-HALF IN THE EVENING Patient taking differently: Take 40 mg by mouth 2 (two) times daily. 11/02/16  Yes Branch, Alphonse Guild, MD  hydrocortisone (ANUSOL-HC) 25 MG suppository Place 1 suppository (25 mg total) rectally 2 (two) times daily. 09/13/20  Yes Idol, Almyra Free, PA-C  pantoprazole (PROTONIX) 40 MG tablet Take 1 tablet (40 mg total) by mouth daily. 09/17/20  Yes TatShanon Brow, MD  ferrous sulfate 325 (65 FE) MG tablet Take 1 tablet (325 mg total) by mouth daily with breakfast. Patient not taking: Reported on 09/21/2020 09/17/20   Orson Eva, MD    Physical Exam: Vitals:   09/21/20 1945 09/21/20 2045 09/21/20 2130 09/21/20 2343  BP: (!) 147/97 (!) 139/98 (!) 140/99 (!) 138/99  Pulse: 81 75  77  Resp: (!) 21 (!) 26 (!) 28 20  Temp:      SpO2: 100% 99%  97%    Physical Exam Constitutional:      General: He is not in acute distress. HENT:     Head: Normocephalic and atraumatic.  Eyes:     Extraocular Movements: Extraocular movements intact.     Conjunctiva/sclera: Conjunctivae normal.  Neck:     Comments: JVD present Cardiovascular:     Rate and Rhythm: Normal rate and regular rhythm.     Pulses: Normal pulses.     Heart sounds: Murmur heard.  Pulmonary:     Effort: Pulmonary effort is normal. No respiratory distress.     Breath sounds: Normal breath sounds. No wheezing or rales.  Abdominal:     General: Bowel sounds are normal. There is no distension.     Palpations: Abdomen is soft.     Tenderness: There is no abdominal tenderness.  Musculoskeletal:     Cervical back: Normal range of motion and neck supple.     Right lower leg: Edema present.     Left lower leg: Edema present.     Comments: +2 pitting edema bilateral lower extremities  Skin:    General: Skin is warm and dry.  Neurological:     General: No focal deficit present.     Mental Status: He is alert and oriented to person, place, and time.     Labs on Admission: I have personally reviewed following labs and imaging studies  CBC: Recent Labs  Lab 09/15/20 0223 09/16/20 0503 09/21/20 1527  WBC 6.1 6.5 6.5  NEUTROABS  --   --  4.7  HGB 7.5* 7.7* 7.6*  HCT 25.5* 26.1* 26.3*  MCV 95.9 96.0 96.7  PLT 268 277 0000000   Basic Metabolic Panel: Recent Labs  Lab 09/15/20 0223 09/16/20 0503 09/21/20 1527  NA 140 138 138  K 3.7 3.7 4.1  CL 102 101 100  CO2 30 29 34*  GLUCOSE 113* 94 111*  BUN 24* 23 8  CREATININE 1.32* 1.42* 1.19  CALCIUM 8.8* 8.4* 8.4*  MG  --  2.4  --    GFR: Estimated Creatinine Clearance: 80.9 mL/min (by C-G  formula based on SCr of 1.19 mg/dL). Liver Function Tests: Recent Labs  Lab 09/21/20 1527  AST 21  ALT 18  ALKPHOS 70  BILITOT 0.4  PROT 5.9*  ALBUMIN 2.9*   No results for input(s): LIPASE, AMYLASE in the last 168 hours. No results for input(s): AMMONIA in the last 168 hours. Coagulation Profile: No results for input(s): INR, PROTIME in the last 168 hours. Cardiac Enzymes: No results for input(s): CKTOTAL, CKMB, CKMBINDEX,  TROPONINI in the last 168 hours. BNP (last 3 results) No results for input(s): PROBNP in the last 8760 hours. HbA1C: No results for input(s): HGBA1C in the last 72 hours. CBG: Recent Labs  Lab 09/15/20 1836 09/15/20 2127 09/16/20 0750 09/16/20 1006 09/16/20 1127  GLUCAP 76 97 56* 86 71   Lipid Profile: No results for input(s): CHOL, HDL, LDLCALC, TRIG, CHOLHDL, LDLDIRECT in the last 72 hours. Thyroid Function Tests: No results for input(s): TSH, T4TOTAL, FREET4, T3FREE, THYROIDAB in the last 72 hours. Anemia Panel: No results for input(s): VITAMINB12, FOLATE, FERRITIN, TIBC, IRON, RETICCTPCT in the last 72 hours. Urine analysis:    Component Value Date/Time   COLORURINE YELLOW 09/13/2020 1603   APPEARANCEUR CLEAR 09/13/2020 1603   LABSPEC 1.024 09/13/2020 1603   PHURINE 6.0 09/13/2020 1603   GLUCOSEU NEGATIVE 09/13/2020 1603   HGBUR NEGATIVE 09/13/2020 1603   BILIRUBINUR NEGATIVE 09/13/2020 1603   KETONESUR NEGATIVE 09/13/2020 1603   PROTEINUR >=300 (A) 09/13/2020 1603   UROBILINOGEN 0.2 08/20/2009 1446   NITRITE NEGATIVE 09/13/2020 1603   LEUKOCYTESUR NEGATIVE 09/13/2020 1603    Radiological Exams on Admission: DG Chest 2 View  Result Date: 09/21/2020 CLINICAL DATA:  Dyspnea EXAM: CHEST - 2 VIEW COMPARISON:  09/13/2020 FINDINGS: Lungs are clear. Mild eventration of the right hemidiaphragm is unchanged. No pneumothorax or pleural effusion. Mild cardiomegaly is stable. Pulmonary vascularity is normal. No acute bone abnormality. Degenerative  changes are seen within the right shoulder and thoracic spine. IMPRESSION: No active cardiopulmonary disease.  Stable cardiomegaly. Electronically Signed   By: Fidela Salisbury MD   On: 09/21/2020 15:55    EKG: Independently reviewed.  Sinus rhythm, incomplete RBBB.  No significant change since prior tracing.  Assessment/Plan Principal Problem:   Acute CHF (congestive heart failure) (HCC) Active Problems:   Atrial fibrillation (HCC)   CKD (chronic kidney disease) stage 3, GFR 30-59 ml/min (HCC)   Acute hypoxemic respiratory failure (HCC)   Elevated troponin   Acute on chronic combined CHF Echo done 09/14/2020 showing EF 45 to 50%, global hypokinesis, severe tricuspid regurgitation, RVSP 56.5.  Appears volume overloaded with JVD and bilateral lower extremity edema.  BNP elevated at 893.  Patient reports compliance with home Lasix 40 mg twice daily but does endorse increased dietary sodium and fluid intake. -Cardiac monitoring.  IV Lasix 40 mg twice daily.  Strict I&O's, daily weights, dietary sodium and fluid restriction.  Acute hypoxemic respiratory failure Satting 96-97% on room air at rest without signs of respiratory distress but did desat to 88% with ambulation.  Although chest x-ray is not showing pulmonary edema, suspect symptoms are related to decompensated CHF.  PE less likely given no tachycardia.  Bilateral lower extremity Dopplers done during recent hospitalization on 09/14/2020 negative for DVT.  No clinical signs of DVT on exam at this time. -Continuous pulse ox, supplemental oxygen as needed  Mild troponin elevation Likely due to demand ischemia from decompensated heart failure.  Troponin borderline elevated and stable.  EKG without acute changes.  Patient is not endorsing chest pain.  Paroxysmal A. Fib Stable.  Currently in sinus rhythm. -Continue home Eliquis and Coreg  Diet controlled type 2 diabetes A1c 5.5 on 09/13/2020.  Hypertension Stable. -IV Lasix as above.   Continue home Coreg.  CKD stage IIIa Stable.  Creatinine at baseline. -Continue to monitor renal function  Iron deficiency anemia Recently admitted for GI bleed but hemoglobin is stable at present and patient is not endorsing any symptoms of active GI  bleed.  Iron was low on labs done during recent hospitalization. -Continue home iron supplement  DVT prophylaxis: Eliquis Code Status: Full code-discussed with the patient. Family Communication: No family available at this time. Disposition Plan: Status is: Inpatient  Remains inpatient appropriate because:Inpatient level of care appropriate due to severity of illness  Dispo: The patient is from: Home              Anticipated d/c is to: Home              Patient currently is not medically stable to d/c.   Difficult to place patient No  Level of care: Level of care: Telemetry Cardiac  The medical decision making on this patient was of high complexity and the patient is at high risk for clinical deterioration, therefore this is a level 3 visit.  Shela Leff MD [facility]  If 7PM-7AM, please contact night-coverage [email or web address]  09/22/2020, 12:15 AM

## 2020-09-21 NOTE — ED Notes (Signed)
WHEN PT AMBULATED PT O2 WAS 88 AND THEN WENT UP TO 91

## 2020-09-22 DIAGNOSIS — J9601 Acute respiratory failure with hypoxia: Secondary | ICD-10-CM

## 2020-09-22 DIAGNOSIS — R778 Other specified abnormalities of plasma proteins: Secondary | ICD-10-CM

## 2020-09-22 DIAGNOSIS — I4891 Unspecified atrial fibrillation: Secondary | ICD-10-CM

## 2020-09-22 LAB — BASIC METABOLIC PANEL
Anion gap: 5 (ref 5–15)
BUN: 8 mg/dL (ref 8–23)
CO2: 35 mmol/L — ABNORMAL HIGH (ref 22–32)
Calcium: 8.4 mg/dL — ABNORMAL LOW (ref 8.9–10.3)
Chloride: 100 mmol/L (ref 98–111)
Creatinine, Ser: 1.27 mg/dL — ABNORMAL HIGH (ref 0.61–1.24)
GFR, Estimated: >60 mL/min (ref >60)
Glucose, Bld: 114 mg/dL — ABNORMAL HIGH (ref 70–99)
Potassium: 3.5 mmol/L (ref 3.5–5.1)
Sodium: 140 mmol/L (ref 135–145)

## 2020-09-22 LAB — RESP PANEL BY RT-PCR (FLU A&B, COVID) ARPGX2
Influenza A by PCR: NEGATIVE
Influenza B by PCR: NEGATIVE
SARS Coronavirus 2 by RT PCR: NEGATIVE

## 2020-09-22 MED ORDER — ACETAMINOPHEN 325 MG PO TABS: 650.0000 mg | ORAL_TABLET | Freq: Four times a day (QID) | ORAL | Status: DC | PRN

## 2020-09-22 MED ORDER — POTASSIUM CHLORIDE CRYS ER 20 MEQ PO TBCR
40.0000 meq | EXTENDED_RELEASE_TABLET | Freq: Once | ORAL | Status: AC
  Administered 2020-09-22: 40 meq via ORAL
  Filled 2020-09-22: qty 2

## 2020-09-22 MED ORDER — METOPROLOL TARTRATE 5 MG/5ML IV SOLN: 5.0000 mg | INTRAVENOUS | Status: DC | PRN

## 2020-09-22 MED ORDER — PANTOPRAZOLE SODIUM 40 MG PO TBEC
40.0000 mg | DELAYED_RELEASE_TABLET | Freq: Every day | ORAL | Status: DC
  Administered 2020-09-22 – 2020-09-29 (×8): 40 mg via ORAL
  Filled 2020-09-22 (×8): qty 1

## 2020-09-22 MED ORDER — ACETAMINOPHEN 650 MG RE SUPP: 650.0000 mg | Freq: Four times a day (QID) | RECTAL | Status: DC | PRN

## 2020-09-22 MED ORDER — SENNOSIDES-DOCUSATE SODIUM 8.6-50 MG PO TABS: 1.0000 | ORAL_TABLET | Freq: Every evening | ORAL | Status: DC | PRN

## 2020-09-22 MED ORDER — FERROUS SULFATE 325 (65 FE) MG PO TABS
325.0000 mg | ORAL_TABLET | Freq: Every day | ORAL | Status: DC
  Administered 2020-09-22 – 2020-09-29 (×8): 325 mg via ORAL
  Filled 2020-09-22 (×8): qty 1

## 2020-09-22 MED ORDER — FUROSEMIDE 10 MG/ML IJ SOLN
40.0000 mg | Freq: Two times a day (BID) | INTRAMUSCULAR | Status: DC
  Administered 2020-09-22 – 2020-09-23 (×3): 40 mg via INTRAVENOUS
  Filled 2020-09-22 (×3): qty 4

## 2020-09-22 MED ORDER — CARVEDILOL 3.125 MG PO TABS
3.1250 mg | ORAL_TABLET | Freq: Two times a day (BID) | ORAL | Status: DC
  Administered 2020-09-22 – 2020-09-23 (×3): 3.125 mg via ORAL
  Filled 2020-09-22 (×3): qty 1

## 2020-09-22 MED ORDER — APIXABAN 5 MG PO TABS
5.0000 mg | ORAL_TABLET | Freq: Two times a day (BID) | ORAL | Status: DC
  Administered 2020-09-22 – 2020-09-29 (×16): 5 mg via ORAL
  Filled 2020-09-22 (×17): qty 1

## 2020-09-22 MED ORDER — DM-GUAIFENESIN ER 30-600 MG PO TB12: 1.0000 | ORAL_TABLET | Freq: Two times a day (BID) | ORAL | Status: DC | PRN

## 2020-09-22 MED ORDER — HYDRALAZINE HCL 20 MG/ML IJ SOLN: 10.0000 mg | INTRAMUSCULAR | Status: DC | PRN

## 2020-09-22 NOTE — ED Notes (Addendum)
Pt resting comfortably at this time.

## 2020-09-22 NOTE — ED Notes (Signed)
Pt given snack and PO fluids per his request.  Sitting up at bedside at this time

## 2020-09-22 NOTE — ED Notes (Signed)
Attempt to call report to unit, nurse unavailable, will try again in 10 minutes

## 2020-09-22 NOTE — Progress Notes (Signed)
PROGRESS NOTE    Dennis Swanson  [id number] DOB: 1955/10/02 DOA: 09/21/2020 PCP: Pcp, No   Brief Narrative:  65 y.o. male with medical history significant of chronic combined CHF, A. fib on Eliquis, type 2 diabetes, hypertension, hyperlipidemia, CKD stage IIIa, obesity, history of PE in 2011, anal mass, hemorrhoids, diverticulosis, gastritis, iron deficiency anemia, recently admitted for GI bleed.  He presents to the ED with complaints of shortness of breath and bilateral lower extremity edema.  Admitted for CHF exacerbation.   Assessment & Plan:   Principal Problem:   Acute CHF (congestive heart failure) (HCC) Active Problems:   Atrial fibrillation (HCC)   CKD (chronic kidney disease) stage 3, GFR 30-59 ml/min (HCC)   Acute hypoxemic respiratory failure (HCC)   Elevated troponin   Acute on chronic combined CHF, EF 45%, severe TR.  Class III. -Echo done 09/14/2020 showing EF 45 to 50%, global hypokinesis, severe tricuspid regurgitation, RVSP 56.5.   -Continue Lasix IV 40 mg twice daily.  Replete electrolytes as appropriate.  Monitor renal function.   Acute hypoxemic respiratory failure -Desaturates down to 88% with ambulation.  Suspect from CHF exacerbation - Management as stated above.   Mild troponin elevation -Likely from demand ischemia.  Continue to monitor   Paroxysmal A. Fib -Continue Coreg and Eliquis   Diet controlled type 2 diabetes -Hemoglobin A1c 5.5 (09/13/2020)-diet controlled   Hypertension -Continue Coreg and Lasix    Iron deficiency anemia -Iron supplements with bowel regimen. -Baseline hemoglobin 7.5.  Recent GI bleed, 08/2020 - Colonoscopy showed anal mass, hemorrhoids.   -EGD-gastritis/hiatal hernia - Pathology-negative for H. pylori, polypoid rectal mucosa with acute/chronic inflammation from rectal ulcer but no evidence of dysplasia or malignancy   Patient does not have CKD, GFR is greater than 60.  Baseline creatinine is around 1.2  DVT  prophylaxis:  apixaban (ELIQUIS) tablet 5 mg  Code Status: Full code Family Communication:    Status is: Inpatient  Remains inpatient appropriate because:Inpatient level of care appropriate due to severity of illness  Dispo: The patient is from: Home              Anticipated d/c is to: Home              Patient currently is not medically stable to d/c.  Currently on IV diuretics   Difficult to place patient No        Subjective: Feeling better after lasix. Mild exertional dyspnea.   Review of Systems Otherwise negative except as per HPI, including: General = no fevers, chills, dizziness,  fatigue HEENT/EYES = negative for loss of vision, double vision, blurred vision,  sore throa Cardiovascular= negative for chest pain, palpitation Respiratory/lungs= negative for shortness of breath, cough, wheezing; hemoptysis,  Gastrointestinal= negative for nausea, vomiting, abdominal pain Genitourinary= negative for Dysuria MSK = Negative for arthralgia, myalgias Neurology= Negative for headache, numbness, tingling  Psychiatry= Negative for suicidal and homocidal ideation Skin= Negative for Rash   Examination: Constitutional: Not in acute distress Respiratory: b/a BS slightly diminished.  Cardiovascular: Normal sinus rhythm, no rubs Abdomen: Nontender nondistended good bowel sounds Musculoskeletal: 1-2= b/l LE pitting edema Skin: No rashes seen Neurologic: CN 2-12 grossly intact.  And nonfocal Psychiatric: Normal judgment and insight. Alert and oriented x 3. Normal mood.      Objective: Vitals:   09/22/20 0600 09/22/20 0700 09/22/20 0807 09/22/20 0815  BP: 140/86 130/86  132/88  Pulse:  (!) 58 81 81  Resp: (!) 22 17  15  Temp:      SpO2:  93%  98%    Intake/Output Summary (Last 24 hours) at 09/22/2020 [id number] Last data filed at 09/22/2020 [id number] Gross per 24 hour  Intake --  Output 750 ml  Net -750 ml   There were no vitals filed for this visit.   Data Reviewed:    CBC: Recent Labs  Lab 09/16/20 0503 09/21/20 1527  WBC 6.5 6.5  NEUTROABS  --  4.7  HGB 7.7* 7.6*  HCT 26.1* 26.3*  MCV 96.0 96.7  PLT 277 0000000   Basic Metabolic Panel: Recent Labs  Lab 09/16/20 0503 09/21/20 1527 09/22/20 0406  NA 138 138 140  K 3.7 4.1 3.5  CL 101 100 100  CO2 29 34* 35*  GLUCOSE 94 111* 114*  BUN '23 8 8  '$ CREATININE 1.42* 1.19 1.27*  CALCIUM 8.4* 8.4* 8.4*  MG 2.4  --   --    GFR: Estimated Creatinine Clearance: 75.8 mL/min (A) (by C-G formula based on SCr of 1.27 mg/dL (H)). Liver Function Tests: Recent Labs  Lab 09/21/20 1527  AST 21  ALT 18  ALKPHOS 70  BILITOT 0.4  PROT 5.9*  ALBUMIN 2.9*   No results for input(s): LIPASE, AMYLASE in the last 168 hours. No results for input(s): AMMONIA in the last 168 hours. Coagulation Profile: No results for input(s): INR, PROTIME in the last 168 hours. Cardiac Enzymes: No results for input(s): CKTOTAL, CKMB, CKMBINDEX, TROPONINI in the last 168 hours. BNP (last 3 results) No results for input(s): PROBNP in the last 8760 hours. HbA1C: No results for input(s): HGBA1C in the last 72 hours. CBG: Recent Labs  Lab 09/15/20 1836 09/15/20 2127 09/16/20 0750 09/16/20 1006 09/16/20 1127  GLUCAP 76 97 56* 86 71   Lipid Profile: No results for input(s): CHOL, HDL, LDLCALC, TRIG, CHOLHDL, LDLDIRECT in the last 72 hours. Thyroid Function Tests: No results for input(s): TSH, T4TOTAL, FREET4, T3FREE, THYROIDAB in the last 72 hours. Anemia Panel: No results for input(s): VITAMINB12, FOLATE, FERRITIN, TIBC, IRON, RETICCTPCT in the last 72 hours. Sepsis Labs: No results for input(s): PROCALCITON, LATICACIDVEN in the last 168 hours.  Recent Results (from the past 240 hour(s))  Resp Panel by RT-PCR (Flu A&B, Covid) Nasopharyngeal Swab     Status: None   Collection Time: 09/14/20 11:52 AM   Specimen: Nasopharyngeal Swab; Nasopharyngeal(NP) swabs in vial transport medium  Result Value Ref Range Status    SARS Coronavirus 2 by RT PCR NEGATIVE NEGATIVE Final    Comment: (NOTE) SARS-CoV-2 target nucleic acids are NOT DETECTED.  The SARS-CoV-2 RNA is generally detectable in upper respiratory specimens during the acute phase of infection. The lowest concentration of SARS-CoV-2 viral copies this assay can detect is 138 copies/mL. A negative result does not preclude SARS-Cov-2 infection and should not be used as the sole basis for treatment or other patient management decisions. A negative result may occur with  improper specimen collection/handling, submission of specimen other than nasopharyngeal swab, presence of viral mutation(s) within the areas targeted by this assay, and inadequate number of viral copies(<138 copies/mL). A negative result must be combined with clinical observations, patient history, and epidemiological information. The expected result is Negative.  Fact Sheet for Patients:  EntrepreneurPulse.com.au  Fact Sheet for Healthcare Providers:  IncredibleEmployment.be  This test is no t yet approved or cleared by the [place] FDA and  has been authorized for detection and/or diagnosis of SARS-CoV-2 by FDA under an Emergency Use Authorization (EUA). This  EUA will remain  in effect (meaning this test can be used) for the duration of the COVID-19 declaration under Section 564(b)(1) of the Act, 21 U.S.C.section 360bbb-3(b)(1), unless the authorization is terminated  or revoked sooner.       Influenza A by PCR NEGATIVE NEGATIVE Final   Influenza B by PCR NEGATIVE NEGATIVE Final    Comment: (NOTE) The Xpert Xpress SARS-CoV-2/FLU/RSV plus assay is intended as an aid in the diagnosis of influenza from Nasopharyngeal swab specimens and should not be used as a sole basis for treatment. Nasal washings and aspirates are unacceptable for Xpert Xpress SARS-CoV-2/FLU/RSV testing.  Fact Sheet for  Patients: EntrepreneurPulse.com.au  Fact Sheet for Healthcare Providers: IncredibleEmployment.be  This test is not yet approved or cleared by the [place] FDA and has been authorized for detection and/or diagnosis of SARS-CoV-2 by FDA under an Emergency Use Authorization (EUA). This EUA will remain in effect (meaning this test can be used) for the duration of the COVID-19 declaration under Section 564(b)(1) of the Act, 21 U.S.C. section 360bbb-3(b)(1), unless the authorization is terminated or revoked.  Performed at [facility], [address] [place], [place] [address]   Resp Panel by RT-PCR (Flu A&B, Covid) Nasopharyngeal Swab     Status: None   Collection Time: 09/21/20 10:40 PM   Specimen: Nasopharyngeal Swab; Nasopharyngeal(NP) swabs in vial transport medium  Result Value Ref Range Status   SARS Coronavirus 2 by RT PCR NEGATIVE NEGATIVE Final    Comment: (NOTE) SARS-CoV-2 target nucleic acids are NOT DETECTED.  The SARS-CoV-2 RNA is generally detectable in upper respiratory specimens during the acute phase of infection. The lowest concentration of SARS-CoV-2 viral copies this assay can detect is 138 copies/mL. A negative result does not preclude SARS-Cov-2 infection and should not be used as the sole basis for treatment or other patient management decisions. A negative result may occur with  improper specimen collection/handling, submission of specimen other than nasopharyngeal swab, presence of viral mutation(s) within the areas targeted by this assay, and inadequate number of viral copies(<138 copies/mL). A negative result must be combined with clinical observations, patient history, and epidemiological information. The expected result is Negative.  Fact Sheet for Patients:  EntrepreneurPulse.com.au  Fact Sheet for Healthcare Providers:  IncredibleEmployment.be  This test is no t yet approved  or cleared by the [place] FDA and  has been authorized for detection and/or diagnosis of SARS-CoV-2 by FDA under an Emergency Use Authorization (EUA). This EUA will remain  in effect (meaning this test can be used) for the duration of the COVID-19 declaration under Section 564(b)(1) of the Act, 21 U.S.C.section 360bbb-3(b)(1), unless the authorization is terminated  or revoked sooner.       Influenza A by PCR NEGATIVE NEGATIVE Final   Influenza B by PCR NEGATIVE NEGATIVE Final    Comment: (NOTE) The Xpert Xpress SARS-CoV-2/FLU/RSV plus assay is intended as an aid in the diagnosis of influenza from Nasopharyngeal swab specimens and should not be used as a sole basis for treatment. Nasal washings and aspirates are unacceptable for Xpert Xpress SARS-CoV-2/FLU/RSV testing.  Fact Sheet for Patients: EntrepreneurPulse.com.au  Fact Sheet for Healthcare Providers: IncredibleEmployment.be  This test is not yet approved or cleared by the [place] FDA and has been authorized for detection and/or diagnosis of SARS-CoV-2 by FDA under an Emergency Use Authorization (EUA). This EUA will remain in effect (meaning this test can be used) for the duration of the COVID-19 declaration under Section 564(b)(1) of the Act,  21 U.S.C. section 360bbb-3(b)(1), unless the authorization is terminated or revoked.  Performed at [facility], [place] [address] [place], [place] [address]          Radiology Studies: DG Chest 2 View  Result Date: 09/21/2020 CLINICAL DATA:  Dyspnea EXAM: CHEST - 2 VIEW COMPARISON:  09/13/2020 FINDINGS: Lungs are clear. Mild eventration of the right hemidiaphragm is unchanged. No pneumothorax or pleural effusion. Mild cardiomegaly is stable. Pulmonary vascularity is normal. No acute bone abnormality. Degenerative changes are seen within the right shoulder and thoracic spine. IMPRESSION: No active cardiopulmonary disease.   Stable cardiomegaly. Electronically Signed   By: Fidela Salisbury MD   On: 09/21/2020 15:55        Scheduled Meds:  apixaban  5 mg Oral BID   carvedilol  3.125 mg Oral BID WC   ferrous sulfate  325 mg Oral Q breakfast   furosemide  40 mg Intravenous BID   pantoprazole  40 mg Oral Daily   Continuous Infusions:   LOS: 1 day   Time spent= 35 mins    Annina Piotrowski Arsenio Loader, MD [facility]  If 7PM-7AM, please contact night-coverage  09/22/2020, 8:32 AM

## 2020-09-23 ENCOUNTER — Other Ambulatory Visit (HOSPITAL_COMMUNITY): Payer: Self-pay

## 2020-09-23 DIAGNOSIS — I10 Essential (primary) hypertension: Secondary | ICD-10-CM

## 2020-09-23 DIAGNOSIS — I5043 Acute on chronic combined systolic (congestive) and diastolic (congestive) heart failure: Secondary | ICD-10-CM

## 2020-09-23 DIAGNOSIS — N1831 Chronic kidney disease, stage 3a: Secondary | ICD-10-CM

## 2020-09-23 DIAGNOSIS — I482 Chronic atrial fibrillation, unspecified: Secondary | ICD-10-CM

## 2020-09-23 LAB — BASIC METABOLIC PANEL
Anion gap: 4 — ABNORMAL LOW (ref 5–15)
BUN: 12 mg/dL (ref 8–23)
CO2: 38 mmol/L — ABNORMAL HIGH (ref 22–32)
Calcium: 8.5 mg/dL — ABNORMAL LOW (ref 8.9–10.3)
Chloride: 98 mmol/L (ref 98–111)
Creatinine, Ser: 1.24 mg/dL (ref 0.61–1.24)
GFR, Estimated: >60 mL/min (ref >60)
Glucose, Bld: 108 mg/dL — ABNORMAL HIGH (ref 70–99)
Potassium: 3.7 mmol/L (ref 3.5–5.1)
Sodium: 140 mmol/L (ref 135–145)

## 2020-09-23 LAB — MAGNESIUM: Magnesium: 1.8 mg/dL (ref 1.7–2.4)

## 2020-09-23 MED ORDER — POTASSIUM CHLORIDE CRYS ER 20 MEQ PO TBCR
40.0000 meq | EXTENDED_RELEASE_TABLET | Freq: Once | ORAL | Status: AC
  Administered 2020-09-23: 40 meq via ORAL
  Filled 2020-09-23: qty 2

## 2020-09-23 MED ORDER — SPIRONOLACTONE 12.5 MG HALF TABLET
12.5000 mg | ORAL_TABLET | Freq: Every day | ORAL | Status: DC
  Administered 2020-09-23 – 2020-09-25 (×3): 12.5 mg via ORAL
  Filled 2020-09-23 (×3): qty 1

## 2020-09-23 MED ORDER — CARVEDILOL 6.25 MG PO TABS
6.2500 mg | ORAL_TABLET | Freq: Two times a day (BID) | ORAL | Status: DC
  Administered 2020-09-23 – 2020-09-28 (×9): 6.25 mg via ORAL
  Filled 2020-09-23 (×10): qty 1

## 2020-09-23 MED ORDER — MAGNESIUM OXIDE -MG SUPPLEMENT 400 (240 MG) MG PO TABS
800.0000 mg | ORAL_TABLET | Freq: Once | ORAL | Status: AC
  Administered 2020-09-23: 800 mg via ORAL
  Filled 2020-09-23: qty 2

## 2020-09-23 MED ORDER — FUROSEMIDE 10 MG/ML IJ SOLN
80.0000 mg | Freq: Two times a day (BID) | INTRAMUSCULAR | Status: DC
  Administered 2020-09-23 – 2020-09-24 (×2): 80 mg via INTRAVENOUS
  Filled 2020-09-23 (×2): qty 8

## 2020-09-23 NOTE — Progress Notes (Signed)
Heart Failure Stewardship Pharmacist Progress Note   PCP: Pcp, No PCP-Cardiologist: None    HPI:  65 yo M with PMH of CHF, afib/flutter, HTN, HLD, T2DM, CKD III, PE, and obesity. He presented to the ED on 8/6 with LE edema and shortness of breath x 2 months. Recent admission for HF exacerbation from 7/29-8/3. CXR with no active cardiopulmonary disease. ECHO last done on 7/30 and LVEF 45-50% (60-65% in 04/2015).  Current HF Medications: Furosemide 40 mg IV BID Carvedilol 6.25 mg BID  Prior to admission HF Medications: Furosemide 40 mg BID Carvedilol 3.125 mg BID  Pertinent Lab Values: Serum creatinine 1.24, BUN 12, Potassium 3.7, Sodium 140, BNP 893, Magnesium 1.8 A1c 5.5 (09/13/20)  Vital Signs: Weight: 270 lbs (admission weight: 272 lbs) Blood pressure: 120/80s  Heart rate: 70s   Medication Assistance / Insurance Benefits Check: Does the patient have prescription insurance?  Yes Type of insurance plan: [place] Medicaid  Outpatient Pharmacy:  Prior to admission outpatient pharmacy: [facility] Is the patient willing to use [facility] at discharge? Yes Is the patient willing to transition their outpatient pharmacy to utilize a [facility] outpatient pharmacy?   Pending    Assessme2nt: 1. Acute on chronic systolic and diastolic CHF (EF [id number]), due to presumed NICM. NYHA class II symptoms. - Continue furosemide 40 mg IV BID - Carvedilol increased to 6.25 mg BID - Consider starting Entresto if BP allows - Consider starting spironolactone 12.5 mg daily - Consider starting Farxiga 10 mg daily prior to discharge   Plan: 1) Medication changes recommended at this time: - Start spironolactone 12.5 mg daily  2) Patient assistance: - Patient has [place] Medicaid - all copays should be $0-3 each per month - HF TOC appt scheduled for 8/15  3)  Education  - To be completed prior to discharge  Kerby Nora, PharmD, BCPS Heart Failure Stewardship Pharmacist Phone [phone]

## 2020-09-23 NOTE — Consult Note (Addendum)
Cardiology Consultation:   Patient ID: Dennis Swanson MRN: [id number]; DOB: 12/10/1955  Admit date: 09/21/2020 Date of Consult: 09/23/2020  PCP:  Merryl Hacker, No   [facility] Providers Cardiologist:  previously Dr. Harl Bowie (has not seen in 3 years)}     Patient Profile:   Dennis Swanson is a 65 y.o. male with a hx of chronic combined CHF with improved EF, permanent atrial fibrillation/atrial flutter, hypertension, hyperlipidemia, DM2, stage III CKD, history of PE in 2011, anal mass with negative biopsy and obesity who is being seen 09/23/2020 for the evaluation of acute on chronic combined CHF at the request of Dr. Reesa Chew.  History of Present Illness:   Dennis Swanson is a 65 year old male with past medical history of chronic combined CHF with improved EF, permanent atrial fibrillation/atrial flutter, hypertension, hyperlipidemia, DM2, stage III CKD, history of PE in 2011, anal mass and obesity.  Echocardiogram obtained in July 2011 showed EF 40 to 45%, mild to moderate MR, moderate to severe LAE, moderately dilated RV with moderate TR, PA peak pressure 39 mmHg.  By March 2017, echocardiogram shows EF has improved to 60 to 65%, moderate LVH, grade 1 DD, mild MR.  Patient was previously admitted to the hospital in October 2017 with acute on chronic heart failure.  He was last seen by Dr. Harl Bowie in February 2019 at which time he was doing well.  He was rate controlled on 240 mg daily of cardia XT and metoprolol succinate 50 mg daily.  He was on 40 mg a.m. and 20 mg p.m. of Lasix and 40 mg daily of lisinopril.  He was also anticoagulated at the time on Xarelto.  Unfortunately he has not followed up since then.    He was seen in the ED in March [id number] for alcoholic intoxication.  He was also incarcerated for about 6 months earlier this year.  He says during his incarceration, he was not given his usual medication properly.  Since he was incarcerated, he has noticed increasing lower extremity edema and  shortness of breath.  He was admitted to the hospital in late July 2022 with peripheral edema and anemia.  Due to hypotension, his lisinopril and metoprolol were discontinued, he was switched to low-dose carvedilol instead.  Venous Doppler was negative.  Xarelto was stopped and switched to Eliquis.  Due to anemia, GI service was consulted and he underwent a EGD and colonoscopy on 7/31.  Colonoscopy revealed anal mass, hemorrhoids and diverticulosis, biopsy revealed acute and chronic inflammation with erosion, glandular hyperplasia and muscular isolation of lamina propria consistent with mucosal prolapse, negative for malignancy.  EGD revealed gastritis, biopsy was negative for H. pylori.  He was discharged on the previous dose of home Lasix.  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  Since discharge, he continued to have worsening lower extremity edema.  He drinks 5-6 bottles of water daily and use of canned foods.  Patient eventually returned to the hospital on 04/21/2020 with worsening shortness of breath.  He says he chronically sleeps upright.  O2 saturation was down to 88% with ambulation.  Patient was he ultimately admitted for acute on chronic combined systolic and diastolic heart failure.  He denies any recent chest discomfort with exertion.  He was placed on IV Lasix at 40  mg twice daily dosing.  Cardiology service consulted for heart failure.   Past Medical History:  Diagnosis Date   Acute pulmonary embolism ([place]) 2011   Alcohol abuse    Atrial flutter ([facility])    CHF (congestive heart failure) ([place])    Hypertension    Lower extremity cellulitis    bilateral   Systolic dysfunction     Past Surgical History:  Procedure Laterality Date   COLONOSCOPY  N/A 05/17/2012   Procedure: COLONOSCOPY;  Surgeon: Danie Binder, MD;  [facility];  Service: Endoscopy;  Laterality: N/A;  10:30   COLONOSCOPY WITH PROPOFOL N/A 09/15/2020   Procedure: COLONOSCOPY WITH PROPOFOL;  Surgeon: Eloise Harman, DO;  [facility];  Service: Endoscopy;  Laterality: N/A;   ESOPHAGOGASTRODUODENOSCOPY (EGD) WITH PROPOFOL N/A 09/15/2020   Procedure: ESOPHAGOGASTRODUODENOSCOPY (EGD) WITH PROPOFOL;  Surgeon: Eloise Harman, DO;  [facility]: [facility];  Service: Endoscopy;  Laterality: N/A;   None       Home Medications:  Prior to Admission medications   Medication Sig Start Date End Date Taking? Authorizing Provider  apixaban (ELIQUIS) 5 MG TABS tablet Take 1 tablet (5 mg total) by mouth 2 (two) times daily. 09/17/20  Yes Tat, Shanon Brow, MD  carvedilol (COREG) 3.125 MG tablet Take 1 tablet (3.125 mg total) by mouth 2 (two) times daily with a meal. 09/17/20  Yes Tat, Shanon Brow, MD  furosemide (LASIX) 40 MG tablet TAKE ONE TABLET BY MOUTH IN THE MORNING AND ONE-HALF IN THE EVENING Patient taking differently: Take 40 mg by mouth 2 (two) times daily. 11/02/16  Yes Branch, Alphonse Guild, MD  hydrocortisone (ANUSOL-HC) 25 MG suppository Place 1 suppository (25 mg total) rectally 2 (two) times daily. 09/13/20  Yes Idol, Almyra Free, PA-C  pantoprazole (PROTONIX) 40 MG tablet Take 1 tablet (40 mg total) by mouth daily. 09/17/20  Yes TatShanon Brow, MD  ferrous sulfate 325 (65 FE) MG tablet Take 1 tablet (325 mg total) by mouth daily with breakfast. Patient not taking: Reported on 09/21/2020 09/17/20   Orson Eva, MD    Inpatient Medications: Scheduled Meds:  apixaban  5 mg Oral BID   carvedilol  3.125 mg Oral BID WC   ferrous sulfate  325 mg Oral Q breakfast   furosemide  40 mg Intravenous BID   pantoprazole  40 mg Oral Daily   Continuous Infusions:  PRN Meds: acetaminophen **OR** acetaminophen, dextromethorphan-guaiFENesin, hydrALAZINE, metoprolol tartrate,  senna-docusate  Allergies:   No Known Allergies  Social History:   Social History   Socioeconomic History   Marital status: Single    Spouse name: Not on file   Number of children: Not on file   Years of education: Not on file   Highest education level: Not on file  Occupational History   Occupation: Unemployed, 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  Tobacco Use   Smoking status: Never   Smokeless tobacco: Never  Vaping Use   Vaping Use: Never used  Substance and Sexual Activity   Alcohol use: Yes    Alcohol/week: 3.0 standard drinks    Types: 3 Cans of beer per week    Comment: once a week   Drug use: No   Sexual activity: Not Currently  Other Topics Concern   Not on file  Social History Narrative   Lives alone, 2 daughters   No regular exercise   Social Determinants of Health   Financial Resource Strain: Not on file  Food Insecurity: Not on file  Transportation Needs: Not on file  Physical Activity: Not on file  Stress: Not on file  Social Connections: Not on file  Intimate Partner Violence: Not on file    Family History:    Family History  Problem Relation Age of Onset   Heart attack Mother    Hypertension Brother    Diabetes Brother    Cancer Father    Colon cancer Neg Hx      ROS:  Please see the history of present illness.   All other ROS reviewed and negative.     Physical Exam/Data:   Vitals:   09/22/20 1950 09/23/20 0015 09/23/20 0405 09/23/20 0826  BP: 122/68 123/87 127/79 118/85  Pulse: 84 75 67 75  Resp: '18 18 20 18  '$ Temp: 98.8 F (37.1 C) 97.8 F (36.6 C) 97.7 F (36.5 C)   TempSrc:  Oral Oral   SpO2: 100% 95% 96% 96%  Weight:   122.7 kg   Height:        Intake/Output Summary (Last 24 hours) at 09/23/2020 1236 Last data filed at 09/23/2020 1135 Gross per 24 hour  Intake 600 ml  Output 1900 ml  Net -1300 ml   Last 3 Weights 09/23/2020 09/22/2020 09/13/2020  Weight (lbs) 270 lb 8 oz 272 lb 14.4 oz 260 lb  Weight (kg) 122.698 kg  123.787 kg 117.935 kg     Body mass index is 36.69 kg/m.  General:  Well nourished, well developed, in no acute distress HEENT: normal Lymph: no adenopathy Neck: no JVD Endocrine:  No thryomegaly Vascular: No carotid bruits; FA pulses 2+ bilaterally without bruits  Cardiac:  normal S1, S2; RRR; no murmur  Lungs:  clear to auscultation bilaterally, no wheezing, rhonchi or rales  Abd: soft, nontender, no hepatomegaly  Ext: trace edema Musculoskeletal:  No deformities, BUE and BLE strength normal and equal Skin: warm and dry  Neuro:  CNs 2-12 intact, no focal abnormalities noted Psych:  Normal affect   EKG:  The EKG was personally reviewed and demonstrates: Atrial flutter Telemetry:  Telemetry was personally reviewed and demonstrates: Persistent atrial flutter, heart rate well-controlled  Relevant CV Studies:  Echo 09/14/2020 1. Right ventricular systolic function is severely reduced. The right  ventricular size is severely enlarged. There is moderately elevated  pulmonary artery systolic pressure. The estimated right ventricular  systolic pressure is 123XX123 mmHg.   2. 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   3. Left ventricular ejection fraction, by estimation, is 45 to 50%. The  left ventricle has low normal function. The left ventricle demonstrates  global hypokinesis. There is moderate left ventricular hypertrophy. Left  ventricular diastolic parameters are   indeterminate. There is the interventricular septum is flattened in  systole and diastole, consistent with right ventricular pressure and  volume overload.   4. Left atrial size was severely dilated.   5. Right atrial size was severely dilated.   6. The mitral valve is grossly normal. Mild mitral valve regurgitation.  No evidence of mitral stenosis.   7. The aortic valve is tricuspid. There is mild calcification of the  aortic valve. Aortic valve regurgitation is trivial. No aortic  stenosis is  present.   8. Aortic dilatation noted. There is mild dilatation of the ascending  aorta, measuring 40 mm.   9. The inferior vena cava is dilated in size with <50% respiratory  variability, suggesting right atrial pressure of 15 mmHg.  10. Cannot exclude small PFO.  Laboratory Data:  High Sensitivity Troponin:   Recent Labs  Lab 09/13/20 1213 09/13/20 1412 09/21/20 1527 09/21/20 1713  TROPONINIHS 27* 28* 21* 20*     Chemistry Recent Labs  Lab 09/21/20 1527 09/22/20 0406 09/23/20 0256  NA 138 140 140  K 4.1 3.5 3.7  CL 100 100 98  CO2 34* 35* 38*  GLUCOSE 111* 114* 108*  BUN '8 8 12  '$ CREATININE 1.19 1.27* 1.24  CALCIUM 8.4* 8.4* 8.5*  GFRNONAA >60 >60 >60  ANIONGAP 4* 5 4*    Recent Labs  Lab 09/21/20 1527  PROT 5.9*  ALBUMIN 2.9*  AST 21  ALT 18  ALKPHOS 70  BILITOT 0.4   Hematology Recent Labs  Lab 09/21/20 1527  WBC 6.5  RBC 2.72*  HGB 7.6*  HCT 26.3*  MCV 96.7  MCH 27.9  MCHC 28.9*  RDW 16.6*  PLT 233   BNP Recent Labs  Lab 09/21/20 1527  BNP 893.0*    DDimer No results for input(s): DDIMER in the last 168 hours.   Radiology/Studies:  DG Chest 2 View  Result Date: 09/21/2020 CLINICAL DATA:  Dyspnea EXAM: CHEST - 2 VIEW COMPARISON:  09/13/2020 FINDINGS: Lungs are clear. Mild eventration of the right hemidiaphragm is unchanged. No pneumothorax or pleural effusion. Mild cardiomegaly is stable. Pulmonary vascularity is normal. No acute bone abnormality. Degenerative changes are seen within the right shoulder and thoracic spine. IMPRESSION: No active cardiopulmonary disease.  Stable cardiomegaly. Electronically Signed   By: Fidela Salisbury MD   On: 09/21/2020 15:55     Assessment and Plan:   Acute on chronic combined systolic and diastolic heart failure  -Currently making good urinary output on 40 mg twice daily of IV Lasix.  Patient was drinking too much fluid and eating high salt content food at home.  Volume overload likely  related to dietary indiscretion.  -He is still appears to be volume overloaded on physical exam, continue IV diuresis  -With significant RV dilatation, will need to consider outpatient sleep study.  Patient will need to reestablish with Dr. Harl Bowie as outpatient.  Chronic atrial fibrillation: Recent EKG obtained in July shows sinus rhythm, although most of the EKG obtained in the past few years shows either afib or aflutter  -Although EKG initially interpreted as sinus rhythm, however looking at the telemetry and later reviewed EKG again, this is more consistent with underlying rate controlled atrial flutter which the patient has been in for the past several years.  -His cardia XT and metoprolol succinate were discontinued during the recent hospitalization, he was instead started on low-dose carvedilol 3.125 mg twice a day.  Heart rate is controlled on the current therapy.  Continue Eliquis.  Hypertension: He was previously on cardiac CT, metoprolol succinate and lisinopril, all of which has been discontinued during the recent hospitalization due to hypotension.  He is currently on low-dose carvedilol, blood pressure seems to be well controlled. Increase coreg to 6.'25mg'$  BID  Hyperlipidemia: He was previously on Lipitor 40 mg daily, however this was discontinued during the last hospitalization in late July.  Consider restart Lipitor 40 mg daily  DM2  CKD stage III: Follow-up on renal function closely  History of PE in 2011: Recent venous Doppler was negative for DVT  Anemia:  - Recently underwent colonoscopy and EGD by GI service on 09/15/2020.  - Colonoscopy revealed anal mass, hemorrhoids and diverticulosis, biopsy revealed acute and chronic inflammation with erosion, glandular hyperplasia and muscular isolation of lamina propria consistent  with mucosal prolapse, negative for malignancy.  EGD revealed gastritis, biopsy was negative for H. pylori.   Risk Assessment/Risk Scores:        [place] Heart Association (NYHA) Functional Class NYHA Class II  CHA2DS2-VASc Score = 4  This indicates a 4.8% annual risk of stroke. The patient's score is based upon: CHF History: Yes HTN History: Yes Diabetes History: Yes Stroke History: No Vascular Disease History: No Age Score: 1 Gender Score: 0        For questions or updates, please contact [place] Please consult [email or web address] for contact info under    Signed, Almyra Deforest, [place]  09/23/2020 12:36 PM  Personally seen and examined. Agree with APP above with the following comments: Briefly 65 yo M with history of P-AFL, HTN, DM, CKD stage IIIA, HLD, prior PE, with HFrEF Patient notes that he is feeling better with appropriate diuresis but still have LE edema Exam notable for Tense legs and abdomen, JVD to the mid neck, obese male Labs notable for GFR greater than 60, slight increase in creatinine Would recommend  - coreg increase to 6.25 mg BID - staring aldactone 12.5 mg PO Daily --increasing lasix to TID-> addendum increase to 80 IV BID - cost effective DOAC - Presently there are significant barriers to ARNI and SGLT2i - if BP tolerates, will plan to add low dose ARB - we have discussed alcohol cessation at [place], MD Cardiologist [facility]  [address] [place], [place] [address] [phone]  2:16 PM

## 2020-09-23 NOTE — Plan of Care (Signed)

## 2020-09-23 NOTE — Progress Notes (Signed)
PROGRESS NOTE    Dennis Swanson  [id number] DOB: November 25, 1955 DOA: 09/21/2020 PCP: Pcp, No   Brief Narrative:  64 y.o. male with medical history significant of chronic combined CHF, A. fib on Eliquis, type 2 diabetes, hypertension, hyperlipidemia, CKD stage IIIa, obesity, history of PE in 2011, anal mass, hemorrhoids, diverticulosis, gastritis, iron deficiency anemia, recently admitted for GI bleed.  He presents to the ED with complaints of shortness of breath and bilateral lower extremity edema.  Admitted for CHF exacerbation.  Patient started on IV Lasix to which she is responding well.   Assessment & Plan:   Principal Problem:   Acute CHF (congestive heart failure) (HCC) Active Problems:   Atrial fibrillation (HCC)   CKD (chronic kidney disease) stage 3, GFR 30-59 ml/min (HCC)   Acute hypoxemic respiratory failure (HCC)   Elevated troponin   Acute on chronic combined CHF, EF 45%, severe TR.  Class III. -Echo done 09/14/2020 showing EF 45 to 50%, global hypokinesis, severe tricuspid regurgitation, RVSP 56.5.   -Continue Lasix IV 40 mg twice daily, monitor electrolytes and renal function - We will consult cardiology since he has not seen one in almost 3 yrs, He will need med management with outpatient f/u.    Acute hypoxemic respiratory failure -Desaturates down to 88% with ambulation.  Suspect from CHF exacerbation - Management as stated above.   Mild troponin elevation -Likely from demand ischemia.  Continue to monitor   Paroxysmal A. Fib -Continue Coreg and Eliquis   Diet controlled type 2 diabetes -Hemoglobin A1c 5.5 (09/13/2020)-diet controlled   Hypertension -Continue Coreg and Lasix    Iron deficiency anemia -Iron supplements with bowel regimen. -Baseline hemoglobin 7.5.  Recent GI bleed, 08/2020 - Colonoscopy showed anal mass, hemorrhoids.   -EGD-gastritis/hiatal hernia - Pathology-negative for H. pylori, polypoid rectal mucosa with acute/chronic  inflammation from rectal ulcer but no evidence of dysplasia or malignancy   Patient does not have CKD, GFR is greater than 60.  Baseline creatinine is around 1.2  DVT prophylaxis:  apixaban (ELIQUIS) tablet 5 mg  Code Status: Full code Family Communication:    Status is: Inpatient  Remains inpatient appropriate because:Inpatient level of care appropriate due to severity of illness  Dispo: The patient is from: Home              Anticipated d/c is to: Home              Patient currently is not medically stable to d/c.  Currently on IV diuretics; Cardiology input requested    Difficult to place patient No        Subjective: Feels better today, dyspnea has improved quite a bit. Still has fluids in his legs   Review of Systems Otherwise negative except as per HPI, including: General = no fevers, chills, dizziness,  fatigue HEENT/EYES = negative for loss of vision, double vision, blurred vision,  sore throa Cardiovascular= negative for chest pain, palpitation Respiratory/lungs= negative for shortness of breath, cough, wheezing; hemoptysis,  Gastrointestinal= negative for nausea, vomiting, abdominal pain Genitourinary= negative for Dysuria MSK = Negative for arthralgia, myalgias Neurology= Negative for headache, numbness, tingling  Psychiatry= Negative for suicidal and homocidal ideation Skin= Negative for Rash   Examination: Constitutional: Not in acute distress Respiratory: minimal bibasilar crackles.  Cardiovascular: Normal sinus rhythm, no rubs Abdomen: Nontender nondistended good bowel sounds Musculoskeletal: 1+ [pitting edema up to his thigh  Skin: No rashes seen Neurologic: CN 2-12 grossly intact.  And nonfocal Psychiatric: Normal judgment and  insight. Alert and oriented x 3. Normal mood.    Objective: Vitals:   09/22/20 1643 09/22/20 1950 09/23/20 0015 09/23/20 0405  BP: 136/83 122/68 123/87 127/79  Pulse: 77 84 75 67  Resp: '18 18 18 20  '$ Temp: 98.4 F (36.9  C) 98.8 F (37.1 C) 97.8 F (36.6 C) 97.7 F (36.5 C)  TempSrc: Oral  Oral Oral  SpO2: 94% 100% 95% 96%  Weight:    122.7 kg  Height:        Intake/Output Summary (Last 24 hours) at 09/23/2020 0755 Last data filed at 09/23/2020 0408 Gross per 24 hour  Intake 720 ml  Output 2200 ml  Net -1480 ml   Filed Weights   09/22/20 1329 09/23/20 0405  Weight: 123.8 kg 122.7 kg     Data Reviewed:   CBC: Recent Labs  Lab 09/21/20 1527  WBC 6.5  NEUTROABS 4.7  HGB 7.6*  HCT 26.3*  MCV 96.7  PLT 0000000   Basic Metabolic Panel: Recent Labs  Lab 09/21/20 1527 09/22/20 0406 09/23/20 0256  NA 138 140 140  K 4.1 3.5 3.7  CL 100 100 98  CO2 34* 35* 38*  GLUCOSE 111* 114* 108*  BUN '8 8 12  '$ CREATININE 1.19 1.27* 1.24  CALCIUM 8.4* 8.4* 8.5*  MG  --   --  1.8   GFR: Estimated Creatinine Clearance: 79.2 mL/min (by C-G formula based on SCr of 1.24 mg/dL). Liver Function Tests: Recent Labs  Lab 09/21/20 1527  AST 21  ALT 18  ALKPHOS 70  BILITOT 0.4  PROT 5.9*  ALBUMIN 2.9*   No results for input(s): LIPASE, AMYLASE in the last 168 hours. No results for input(s): AMMONIA in the last 168 hours. Coagulation Profile: No results for input(s): INR, PROTIME in the last 168 hours. Cardiac Enzymes: No results for input(s): CKTOTAL, CKMB, CKMBINDEX, TROPONINI in the last 168 hours. BNP (last 3 results) No results for input(s): PROBNP in the last 8760 hours. HbA1C: No results for input(s): HGBA1C in the last 72 hours. CBG: Recent Labs  Lab 09/16/20 1006 09/16/20 1127  GLUCAP 86 71   Lipid Profile: No results for input(s): CHOL, HDL, LDLCALC, TRIG, CHOLHDL, LDLDIRECT in the last 72 hours. Thyroid Function Tests: No results for input(s): TSH, T4TOTAL, FREET4, T3FREE, THYROIDAB in the last 72 hours. Anemia Panel: No results for input(s): VITAMINB12, FOLATE, FERRITIN, TIBC, IRON, RETICCTPCT in the last 72 hours. Sepsis Labs: No results for input(s): PROCALCITON, LATICACIDVEN  in the last 168 hours.  Recent Results (from the past 240 hour(s))  Resp Panel by RT-PCR (Flu A&B, Covid) Nasopharyngeal Swab     Status: None   Collection Time: 09/14/20 11:52 AM   Specimen: Nasopharyngeal Swab; Nasopharyngeal(NP) swabs in vial transport medium  Result Value Ref Range Status   SARS Coronavirus 2 by RT PCR NEGATIVE NEGATIVE Final    Comment: (NOTE) SARS-CoV-2 target nucleic acids are NOT DETECTED.  The SARS-CoV-2 RNA is generally detectable in upper respiratory specimens during the acute phase of infection. The lowest concentration of SARS-CoV-2 viral copies this assay can detect is 138 copies/mL. A negative result does not preclude SARS-Cov-2 infection and should not be used as the sole basis for treatment or other patient management decisions. A negative result may occur with  improper specimen collection/handling, submission of specimen other than nasopharyngeal swab, presence of viral mutation(s) within the areas targeted by this assay, and inadequate number of viral copies(<138 copies/mL). A negative result must be combined with clinical  observations, patient history, and epidemiological information. The expected result is Negative.  Fact Sheet for Patients:  EntrepreneurPulse.com.au  Fact Sheet for Healthcare Providers:  IncredibleEmployment.be  This test is no t yet approved or cleared by the [place] FDA and  has been authorized for detection and/or diagnosis of SARS-CoV-2 by FDA under an Emergency Use Authorization (EUA). This EUA will remain  in effect (meaning this test can be used) for the duration of the COVID-19 declaration under Section 564(b)(1) of the Act, 21 U.S.C.section 360bbb-3(b)(1), unless the authorization is terminated  or revoked sooner.       Influenza A by PCR NEGATIVE NEGATIVE Final   Influenza B by PCR NEGATIVE NEGATIVE Final    Comment: (NOTE) The Xpert Xpress SARS-CoV-2/FLU/RSV plus  assay is intended as an aid in the diagnosis of influenza from Nasopharyngeal swab specimens and should not be used as a sole basis for treatment. Nasal washings and aspirates are unacceptable for Xpert Xpress SARS-CoV-2/FLU/RSV testing.  Fact Sheet for Patients: EntrepreneurPulse.com.au  Fact Sheet for Healthcare Providers: IncredibleEmployment.be  This test is not yet approved or cleared by the [place] FDA and has been authorized for detection and/or diagnosis of SARS-CoV-2 by FDA under an Emergency Use Authorization (EUA). This EUA will remain in effect (meaning this test can be used) for the duration of the COVID-19 declaration under Section 564(b)(1) of the Act, 21 U.S.C. section 360bbb-3(b)(1), unless the authorization is terminated or revoked.  Performed at [facility], [address], [place], [place] [address]   Resp Panel by RT-PCR (Flu A&B, Covid) Nasopharyngeal Swab     Status: None   Collection Time: 09/21/20 10:40 PM   Specimen: Nasopharyngeal Swab; Nasopharyngeal(NP) swabs in vial transport medium  Result Value Ref Range Status   SARS Coronavirus 2 by RT PCR NEGATIVE NEGATIVE Final    Comment: (NOTE) SARS-CoV-2 target nucleic acids are NOT DETECTED.  The SARS-CoV-2 RNA is generally detectable in upper respiratory specimens during the acute phase of infection. The lowest concentration of SARS-CoV-2 viral copies this assay can detect is 138 copies/mL. A negative result does not preclude SARS-Cov-2 infection and should not be used as the sole basis for treatment or other patient management decisions. A negative result may occur with  improper specimen collection/handling, submission of specimen other than nasopharyngeal swab, presence of viral mutation(s) within the areas targeted by this assay, and inadequate number of viral copies(<138 copies/mL). A negative result must be combined with clinical observations, patient  history, and epidemiological information. The expected result is Negative.  Fact Sheet for Patients:  EntrepreneurPulse.com.au  Fact Sheet for Healthcare Providers:  IncredibleEmployment.be  This test is no t yet approved or cleared by the [place] FDA and  has been authorized for detection and/or diagnosis of SARS-CoV-2 by FDA under an Emergency Use Authorization (EUA). This EUA will remain  in effect (meaning this test can be used) for the duration of the COVID-19 declaration under Section 564(b)(1) of the Act, 21 U.S.C.section 360bbb-3(b)(1), unless the authorization is terminated  or revoked sooner.       Influenza A by PCR NEGATIVE NEGATIVE Final   Influenza B by PCR NEGATIVE NEGATIVE Final    Comment: (NOTE) The Xpert Xpress SARS-CoV-2/FLU/RSV plus assay is intended as an aid in the diagnosis of influenza from Nasopharyngeal swab specimens and should not be used as a sole basis for treatment. Nasal washings and aspirates are unacceptable for Xpert Xpress SARS-CoV-2/FLU/RSV testing.  Fact Sheet for Patients: EntrepreneurPulse.com.au  Fact Sheet  for Healthcare Providers: IncredibleEmployment.be  This test is not yet approved or cleared by the [place] and has been authorized for detection and/or diagnosis of SARS-CoV-2 by FDA under an Emergency Use Authorization (EUA). This EUA will remain in effect (meaning this test can be used) for the duration of the COVID-19 declaration under Section 564(b)(1) of the Act, 21 U.S.C. section 360bbb-3(b)(1), unless the authorization is terminated or revoked.  Performed at [facility], [place] [address], [place], [place] [address]          Radiology Studies: DG Chest 2 View  Result Date: 09/21/2020 CLINICAL DATA:  Dyspnea EXAM: CHEST - 2 VIEW COMPARISON:  09/13/2020 FINDINGS: Lungs are clear. Mild eventration of the right hemidiaphragm is  unchanged. No pneumothorax or pleural effusion. Mild cardiomegaly is stable. Pulmonary vascularity is normal. No acute bone abnormality. Degenerative changes are seen within the right shoulder and thoracic spine. IMPRESSION: No active cardiopulmonary disease.  Stable cardiomegaly. Electronically Signed   By: Fidela Salisbury MD   On: 09/21/2020 15:55        Scheduled Meds:  apixaban  5 mg Oral BID   carvedilol  3.125 mg Oral BID WC   ferrous sulfate  325 mg Oral Q breakfast   furosemide  40 mg Intravenous BID   pantoprazole  40 mg Oral Daily   potassium chloride  40 mEq Oral Once   Continuous Infusions:   LOS: 2 days   Time spent= 35 mins    Heydi Swango Arsenio Loader, MD [facility]  If 7PM-7AM, please contact night-coverage  09/23/2020, 7:55 AM

## 2020-09-24 ENCOUNTER — Other Ambulatory Visit (HOSPITAL_COMMUNITY): Payer: Self-pay

## 2020-09-24 DIAGNOSIS — J9601 Acute respiratory failure with hypoxia: Secondary | ICD-10-CM

## 2020-09-24 LAB — BASIC METABOLIC PANEL
Anion gap: 4 — ABNORMAL LOW (ref 5–15)
BUN: 12 mg/dL (ref 8–23)
CO2: 39 mmol/L — ABNORMAL HIGH (ref 22–32)
Calcium: 8.9 mg/dL (ref 8.9–10.3)
Chloride: 97 mmol/L — ABNORMAL LOW (ref 98–111)
Creatinine, Ser: 1.19 mg/dL (ref 0.61–1.24)
GFR, Estimated: >60 mL/min (ref >60)
Glucose, Bld: 99 mg/dL (ref 70–99)
Potassium: 4.1 mmol/L (ref 3.5–5.1)
Sodium: 140 mmol/L (ref 135–145)

## 2020-09-24 LAB — LIPID PANEL
Cholesterol: 91 mg/dL (ref 0–200)
HDL: 35 mg/dL — ABNORMAL LOW (ref >40)
LDL Cholesterol: 44 mg/dL (ref 0–99)
Total CHOL/HDL Ratio: 2.6 RATIO
Triglycerides: 61 mg/dL (ref <150)
VLDL: 12 mg/dL (ref 0–40)

## 2020-09-24 LAB — MAGNESIUM: Magnesium: 1.9 mg/dL (ref 1.7–2.4)

## 2020-09-24 MED ORDER — FUROSEMIDE 40 MG PO TABS: 40.0000 mg | ORAL_TABLET | Freq: Two times a day (BID) | ORAL | Status: DC

## 2020-09-24 MED ORDER — SACUBITRIL-VALSARTAN 24-26 MG PO TABS
1.0000 | ORAL_TABLET | Freq: Two times a day (BID) | ORAL | Status: DC
  Administered 2020-09-24 – 2020-09-28 (×7): 1 via ORAL
  Filled 2020-09-24 (×8): qty 1

## 2020-09-24 MED ORDER — FUROSEMIDE 10 MG/ML IJ SOLN
80.0000 mg | Freq: Two times a day (BID) | INTRAMUSCULAR | Status: DC
  Administered 2020-09-24 – 2020-09-25 (×2): 80 mg via INTRAVENOUS
  Filled 2020-09-24 (×2): qty 8

## 2020-09-24 NOTE — Progress Notes (Addendum)
Progress Note  Patient Name: Dennis Swanson Date of Encounter: 09/24/2020  [facility] Cardiologist: previously sees Dr. Harl Bowie (lost to follow up for 3 years)}    Subjective   Patient is sitting in chair, states he is much improved with SOB and leg edema. He denied any chest pain. He is agreeable with medication change.   Inpatient Medications    Scheduled Meds:  apixaban  5 mg Oral BID   carvedilol  6.25 mg Oral BID WC   ferrous sulfate  325 mg Oral Q breakfast   furosemide  40 mg Oral BID   pantoprazole  40 mg Oral Daily   sacubitril-valsartan  1 tablet Oral BID   spironolactone  12.5 mg Oral Daily   Continuous Infusions:  PRN Meds: acetaminophen **OR** acetaminophen, dextromethorphan-guaiFENesin, hydrALAZINE, metoprolol tartrate, senna-docusate   Vital Signs    Vitals:   09/23/20 1701 09/23/20 2040 09/24/20 0611 09/24/20 0737  BP: 130/86 127/80 128/80 129/76  Pulse: 75 74 74 76  Resp:  '20 18 16  '$ Temp:  98.1 F (36.7 C) 98.5 F (36.9 C) 98.9 F (37.2 C)  TempSrc:  Oral Oral Oral  SpO2:  99% (!) 86% 91%  Weight:   118 kg   Height:        Intake/Output Summary (Last 24 hours) at 09/24/2020 1113 Last data filed at 09/24/2020 0900 Gross per 24 hour  Intake 580 ml  Output 1600 ml  Net -1020 ml   Last 3 Weights 09/24/2020 09/23/2020 09/22/2020  Weight (lbs) 260 lb 3.2 oz 270 lb 8 oz 272 lb 14.4 oz  Weight (kg) 118.026 kg 122.698 kg 123.787 kg      Telemetry    In and out of rate controlled A fib /flutter, currently SR   - Personally Reviewed  ECG    N/A this AM - Personally Reviewed  Physical Exam   GEN: No acute distress.   Neck: No JVD Cardiac: RRR, no murmurs, rubs, or gallops.  Respiratory: Clear to auscultation bilaterally. On room air.  GI: Soft, nontender, non-distended  MS: 1- BLE edema; No deformity. Neuro:  Nonfocal  Psych: Normal affect   Labs    High Sensitivity Troponin:   Recent Labs  Lab 09/13/20 1213 09/13/20 1412  09/21/20 1527 09/21/20 1713  TROPONINIHS 27* 28* 21* 20*      Chemistry Recent Labs  Lab 09/21/20 1527 09/22/20 0406 09/23/20 0256 09/24/20 0232  NA 138 140 140 140  K 4.1 3.5 3.7 4.1  CL 100 100 98 97*  CO2 34* 35* 38* 39*  GLUCOSE 111* 114* 108* 99  BUN '8 8 12 12  '$ CREATININE 1.19 1.27* 1.24 1.19  CALCIUM 8.4* 8.4* 8.5* 8.9  PROT 5.9*  --   --   --   ALBUMIN 2.9*  --   --   --   AST 21  --   --   --   ALT 18  --   --   --   ALKPHOS 70  --   --   --   BILITOT 0.4  --   --   --   GFRNONAA >60 >60 >60 >60  ANIONGAP 4* 5 4* 4*     Hematology Recent Labs  Lab 09/21/20 1527  WBC 6.5  RBC 2.72*  HGB 7.6*  HCT 26.3*  MCV 96.7  MCH 27.9  MCHC 28.9*  RDW 16.6*  PLT 233    BNP Recent Labs  Lab 09/21/20 1527  BNP 893.0*  DDimer No results for input(s): DDIMER in the last 168 hours.   Radiology    No results found.  Cardiac Studies   Echo from 09/14/20:   1. Right ventricular systolic function is severely reduced. The right  ventricular size is severely enlarged. There is moderately elevated  pulmonary artery systolic pressure. The estimated right ventricular  systolic pressure is 123XX123 mmHg.   2. 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   3. Left ventricular ejection fraction, by estimation, is 45 to 50%. The  left ventricle has low normal function. The left ventricle demonstrates  global hypokinesis. There is moderate left ventricular hypertrophy. Left  ventricular diastolic parameters are   indeterminate. There is the interventricular septum is flattened in  systole and diastole, consistent with right ventricular pressure and  volume overload.   4. Left atrial size was severely dilated.   5. Right atrial size was severely dilated.   6. The mitral valve is grossly normal. Mild mitral valve regurgitation.  No evidence of mitral stenosis.   7. The aortic valve is tricuspid. There is mild calcification of the   aortic valve. Aortic valve regurgitation is trivial. No aortic stenosis is  present.   8. Aortic dilatation noted. There is mild dilatation of the ascending  aorta, measuring 40 mm.   9. The inferior vena cava is dilated in size with <50% respiratory  variability, suggesting right atrial pressure of 15 mmHg.  10. Cannot exclude small PFO.    Patient Profile   Dennis Swanson is a 65 y.o. 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   Assessment & Plan    Acute hypoxic respiratory failure -Desaturation to 88% with ambulation yesterday, oxygen as needed, currently on room air, CHF management as below.  Acute on chronic systolic and diastolic heart failure -Presented with worsening SOB and orthopnea, last hospitalization for CHF 08/2020 -Appears drinking too much water  - CXR from 09/21/20 without acute findings - BNP 893 POA, was 244 in 2018  - HS trop 21 >20, likely demand ischemia -Recent echocardiogram from 09/14/2020 showed EF down to 45 to 50%, severely enlarged RV, RV systolic pressure elevated at 56.5 mmHg, severe biatrial enlargement, moderate LVH, mild MR, severe TR secondary to annular dilatation, mild dilatation of the ascending aorta measuring at 40 mm. - Net 3.3 L since admission, UOP 1677m over the past 24 hours, weight is down from 272 to 260  - increased IV lasix to '80mg'$  BID yesterday, adequate response, clinically markedly improved, will continue IV Lasix 80 mg BID for now per MD - Continue monitor strict intake and output, daily weight, FR <1.5L daily, low Na diet, and daily electrolytes - GDMT: increased carvedilol to 6.25 mg twice daily, started spironolactone 12.5 mg daily, will start Entresto 24/'26mg'$  BID today , pharmacy to assist cost  barriers, monitor BP and renal index, may add Farxiga 10 mg daily before DC if renal function stable  - consider outpatient sleep study and PFTs given severe RV enlargement  - discussed ETOH moderation   Persistent atrial flutter -Rate was controlled on diltiazem and metoprolol previously, both were discontinued from recent hospitalization in July and transition to carvedilol -Rate is controlled currently, in and out of A fib/flutter, without symptoms, continue Coreg and anticoagulation  with Eliquis  HTN - BP controlled, continue Coreg 6.25 mg twice daily, spironolactone 12.5 mg, add Entresto 24/26 BID today  HLD - will check lipid panel, statin was discontinued during recent hospitalization, previously on Lipitor 40 mg daily, may resume pending lipid panel   Type 2 diabetes Iron deficiency anemia History of recent GI bleed 08/2020 - managed per IM        For questions or updates, please contact [place] Please consult [email or web address] for contact info under        Signed, Margie Billet, NP  09/24/2020, 11:13 AM    Personally seen and examined. Agree with APP above with the following comments: Briefly 65 yo M with HF and persistent AFL Patient notes he is urinating a fair amount; leg swelling is improved but not resolved Exam notable for tense legs bilaterally +1 pit; regular rhythm  Labs notable for stable creatinine, CO2 beginning to rise Personally reviewed relevant tests; No RVR noted on tele Would recommend  - continue IV lasix - will start Entresto today - likely SGLT2i tomorrow - nearing euvolemia - discussed with HF team and primary MD  Rudean Haskell, MD [place]  [address], [address] [place], [place] [address] [phone]  11:22 AM

## 2020-09-24 NOTE — Progress Notes (Signed)
Heart Failure Stewardship Pharmacist Progress Note   PCP: Pcp, No PCP-Cardiologist: None    HPI:  65 yo M with PMH of CHF, afib/flutter, HTN, HLD, T2DM, CKD III, PE, and obesity. He presented to the ED on 8/6 with LE edema and shortness of breath x 2 months. Recent admission for HF exacerbation from 7/29-8/3. CXR with no active cardiopulmonary disease. ECHO last done on 7/30 and LVEF 45-50% (60-65% in 04/2015).  Current HF Medications: Furosemide 80 mg IV BID Carvedilol 6.25 mg BID Spironolactone 12.5 mg daily  Prior to admission HF Medications: Furosemide 40 mg BID Carvedilol 3.125 mg BID  Pertinent Lab Values: Serum creatinine 1.19, BUN 12, Potassium 4.1, Sodium 140, BNP 893, Magnesium 1.9 A1c 5.5 (09/13/20)  Vital Signs: Weight: 260 lbs (admission weight: 272 lbs) Blood pressure: 130/80s  Heart rate: 70s   Medication Assistance / Insurance Benefits Check: Does the patient have prescription insurance?  Yes Type of insurance plan: [place] Medicaid  Outpatient Pharmacy:  Prior to admission outpatient pharmacy: [facility] Is the patient willing to use [facility] at discharge? Yes Is the patient willing to transition their outpatient pharmacy to utilize a [facility] outpatient pharmacy?   Pending    Assessment: 1. Acute on chronic systolic and diastolic CHF (EF [id number]), due to presumed NICM. NYHA class II symptoms. - Continue furosemide 80 mg IV BID - Continue carvedilol 6.25 mg BID - Consider starting Entresto 24/26 mg BID - Continue spironolactone 12.5 mg daily - Consider starting Farxiga 10 mg daily prior to discharge   Plan: 1) Medication changes recommended at this time: - Start Entresto 24/26 mg BID  2) Patient assistance: - Patient has [place] Medicaid - all copays should be $0-3 each per month - Entresto, Jardiance/Farxiga all require prior authorizations with traditional [place] Medicaid plan - HF TOC appt scheduled for 8/15  3)  Education  - To be completed prior to  discharge  Kerby Nora, PharmD, BCPS Heart Failure Stewardship Pharmacist Phone [phone]

## 2020-09-24 NOTE — Progress Notes (Signed)
PROGRESS NOTE    Dennis Swanson  [id number] DOB: 02/21/1955 DOA: 09/21/2020 PCP: Pcp, No   Brief Narrative:  65 y.o. male with medical history significant of chronic combined CHF, A. fib on Eliquis, type 2 diabetes, hypertension, hyperlipidemia, CKD stage IIIa, obesity, history of PE in 2011, anal mass, hemorrhoids, diverticulosis, gastritis, iron deficiency anemia, recently admitted for GI bleed.  He presents to the ED with complaints of shortness of breath and bilateral lower extremity edema.  Admitted for CHF exacerbation.  Patient started on IV Lasix to which she is responding well.  Cardiology consulted for medication adjustment.   Assessment & Plan:   Principal Problem:   Acute CHF (congestive heart failure) (HCC) Active Problems:   Atrial fibrillation (HCC)   CKD (chronic kidney disease) stage 3, GFR 30-59 ml/min (HCC)   Acute hypoxemic respiratory failure (HCC)   Elevated troponin   Acute on chronic combined CHF, EF 45%, severe TR.  Class III. -Echo done 09/14/2020 showing EF 45 to 50%, global hypokinesis, severe tricuspid regurgitation, RVSP 56.5.  Renal function is stable. -Continue IV Lasix 80 mg twice daily, Aldactone and Coreg added. - Defer further cardiac med adjustment to cardiology team.   Acute hypoxemic respiratory failure Bradycardia, heart rate 35 when asleep -Desaturates down to 88% with ambulation.  Suspect from CHF exacerbation - Management as stated above. -Transient drop in heart rate and oxygen when he is asleep, suspect underlying sleep apnea.  Will refer for outpatient sleep study upon discharge.   Mild troponin elevation -Likely from demand ischemia.  Continue to monitor   Paroxysmal A. Fib -Continue Coreg and Eliquis   Diet controlled type 2 diabetes -Hemoglobin A1c 5.5 (09/13/2020)-diet controlled   Hypertension -Continue Coreg and Lasix    Iron deficiency anemia - Iron supplements with bowel regimen. -Baseline hemoglobin  7.5.  Recent GI bleed, 08/2020 - Colonoscopy showed anal mass, hemorrhoids.   -EGD-gastritis/hiatal hernia - Pathology-negative for H. pylori, polypoid rectal mucosa with acute/chronic inflammation from rectal ulcer but no evidence of dysplasia or malignancy   Patient does not have CKD, GFR is greater than 60.  Baseline creatinine is around 1.2  DVT prophylaxis:  apixaban (ELIQUIS) tablet 5 mg  Code Status: Full code Family Communication:    Status is: Inpatient  Remains inpatient appropriate because:Inpatient level of care appropriate due to severity of illness  Dispo: The patient is from: Home              Anticipated d/c is to: Home              Patient currently is not medically stable to d/c.  Patient is still third spacing quite a bit of fluids, requiring IV diuretics.  Maintain hospital stay for aggressive diuresis and further cardiac medication adjustment.   Difficult to place patient No        Subjective: Patient's shortness of breath and lower extremity swelling is better but still has fluid in his lower extremity.  Denies any orthopnea.  Review of Systems Otherwise negative except as per HPI, including: General: Denies fever, chills, night sweats or unintended weight loss. Resp: Denies cough, wheezing, shortness of breath. Cardiac: Denies chest pain, palpitations, orthopnea, paroxysmal nocturnal dyspnea. GI: Denies abdominal pain, nausea, vomiting, diarrhea or constipation GU: Denies dysuria, frequency, hesitancy or incontinence MS: Denies muscle aches, joint pain or swelling Neuro: Denies headache, neurologic deficits (focal weakness, numbness, tingling), abnormal gait Psych: Denies anxiety, depression, SI/HI/AVH Skin: Denies new rashes or lesions ID: Denies sick contacts,  exotic exposures, travel  Examination: Constitutional: Not in acute distress Respiratory: Clear to auscultation bilaterally Cardiovascular: Normal sinus rhythm, no rubs Abdomen:  Nontender nondistended good bowel sounds Musculoskeletal: 2+ bilateral lower extremity pitting edema.  1+ pitting edema up in his thighs. Skin: No rashes seen Neurologic: CN 2-12 grossly intact.  And nonfocal Psychiatric: Normal judgment and insight. Alert and oriented x 3. Normal mood.   Objective: Vitals:   09/24/20 0611 09/24/20 0737 09/24/20 1119 09/24/20 1154  BP: 128/80 129/76 125/84 126/76  Pulse: 74 76 78 74  Resp: '18 16 16   '$ Temp: 98.5 F (36.9 C) 98.9 F (37.2 C) 97.7 F (36.5 C)   TempSrc: Oral Oral Oral   SpO2: (!) 86% 91% 98% 97%  Weight: 118 kg     Height:        Intake/Output Summary (Last 24 hours) at 09/24/2020 1259 Last data filed at 09/24/2020 0900 Gross per 24 hour  Intake 580 ml  Output 800 ml  Net -220 ml   Filed Weights   09/22/20 1329 09/23/20 0405 09/24/20 0611  Weight: 123.8 kg 122.7 kg 118 kg     Data Reviewed:   CBC: Recent Labs  Lab 09/21/20 1527  WBC 6.5  NEUTROABS 4.7  HGB 7.6*  HCT 26.3*  MCV 96.7  PLT 0000000   Basic Metabolic Panel: Recent Labs  Lab 09/21/20 1527 09/22/20 0406 09/23/20 0256 09/24/20 0232  NA 138 140 140 140  K 4.1 3.5 3.7 4.1  CL 100 100 98 97*  CO2 34* 35* 38* 39*  GLUCOSE 111* 114* 108* 99  BUN '8 8 12 12  '$ CREATININE 1.19 1.27* 1.24 1.19  CALCIUM 8.4* 8.4* 8.5* 8.9  MG  --   --  1.8 1.9   GFR: Estimated Creatinine Clearance: 81 mL/min (by C-G formula based on SCr of 1.19 mg/dL). Liver Function Tests: Recent Labs  Lab 09/21/20 1527  AST 21  ALT 18  ALKPHOS 70  BILITOT 0.4  PROT 5.9*  ALBUMIN 2.9*   No results for input(s): LIPASE, AMYLASE in the last 168 hours. No results for input(s): AMMONIA in the last 168 hours. Coagulation Profile: No results for input(s): INR, PROTIME in the last 168 hours. Cardiac Enzymes: No results for input(s): CKTOTAL, CKMB, CKMBINDEX, TROPONINI in the last 168 hours. BNP (last 3 results) No results for input(s): PROBNP in the last 8760 hours. HbA1C: No  results for input(s): HGBA1C in the last 72 hours. CBG: No results for input(s): GLUCAP in the last 168 hours.  Lipid Profile: Recent Labs    09/24/20 0232  CHOL 91  HDL 35*  LDLCALC 44  TRIG 61  CHOLHDL 2.6   Thyroid Function Tests: No results for input(s): TSH, T4TOTAL, FREET4, T3FREE, THYROIDAB in the last 72 hours. Anemia Panel: No results for input(s): VITAMINB12, FOLATE, FERRITIN, TIBC, IRON, RETICCTPCT in the last 72 hours. Sepsis Labs: No results for input(s): PROCALCITON, LATICACIDVEN in the last 168 hours.  Recent Results (from the past 240 hour(s))  Resp Panel by RT-PCR (Flu A&B, Covid) Nasopharyngeal Swab     Status: None   Collection Time: 09/21/20 10:40 PM   Specimen: Nasopharyngeal Swab; Nasopharyngeal(NP) swabs in vial transport medium  Result Value Ref Range Status   SARS Coronavirus 2 by RT PCR NEGATIVE NEGATIVE Final    Comment: (NOTE) SARS-CoV-2 target nucleic acids are NOT DETECTED.  The SARS-CoV-2 RNA is generally detectable in upper respiratory specimens during the acute phase of infection. The lowest concentration of SARS-CoV-2  viral copies this assay can detect is 138 copies/mL. A negative result does not preclude SARS-Cov-2 infection and should not be used as the sole basis for treatment or other patient management decisions. A negative result may occur with  improper specimen collection/handling, submission of specimen other than nasopharyngeal swab, presence of viral mutation(s) within the areas targeted by this assay, and inadequate number of viral copies(<138 copies/mL). A negative result must be combined with clinical observations, patient history, and epidemiological information. The expected result is Negative.  Fact Sheet for Patients:  EntrepreneurPulse.com.au  Fact Sheet for Healthcare Providers:  IncredibleEmployment.be  This test is no t yet approved or cleared by the [place] FDA and  has  been authorized for detection and/or diagnosis of SARS-CoV-2 by FDA under an Emergency Use Authorization (EUA). This EUA will remain  in effect (meaning this test can be used) for the duration of the COVID-19 declaration under Section 564(b)(1) of the Act, 21 U.S.C.section 360bbb-3(b)(1), unless the authorization is terminated  or revoked sooner.       Influenza A by PCR NEGATIVE NEGATIVE Final   Influenza B by PCR NEGATIVE NEGATIVE Final    Comment: (NOTE) The Xpert Xpress SARS-CoV-2/FLU/RSV plus assay is intended as an aid in the diagnosis of influenza from Nasopharyngeal swab specimens and should not be used as a sole basis for treatment. Nasal washings and aspirates are unacceptable for Xpert Xpress SARS-CoV-2/FLU/RSV testing.  Fact Sheet for Patients: EntrepreneurPulse.com.au  Fact Sheet for Healthcare Providers: IncredibleEmployment.be  This test is not yet approved or cleared by the [place] FDA and has been authorized for detection and/or diagnosis of SARS-CoV-2 by FDA under an Emergency Use Authorization (EUA). This EUA will remain in effect (meaning this test can be used) for the duration of the COVID-19 declaration under Section 564(b)(1) of the Act, 21 U.S.C. section 360bbb-3(b)(1), unless the authorization is terminated or revoked.  Performed at [facility], [place] [address] [place], [place] [address]          Radiology Studies: No results found.      Scheduled Meds:  apixaban  5 mg Oral BID   carvedilol  6.25 mg Oral BID WC   ferrous sulfate  325 mg Oral Q breakfast   furosemide  80 mg Intravenous BID   pantoprazole  40 mg Oral Daily   sacubitril-valsartan  1 tablet Oral BID   spironolactone  12.5 mg Oral Daily   Continuous Infusions:   LOS: 3 days   Time spent= 35 mins    Sayuri Rhames Arsenio Loader, MD [facility]  If 7PM-7AM, please contact night-coverage  09/24/2020, 12:59 PM

## 2020-09-24 NOTE — Progress Notes (Signed)
Patient HR drop to 32 not sustain patient was sleeping at the time. MD notified. VSS no new order given. will continue to monitor the patient.

## 2020-09-25 ENCOUNTER — Other Ambulatory Visit (HOSPITAL_COMMUNITY): Payer: Self-pay

## 2020-09-25 ENCOUNTER — Ambulatory Visit (HOSPITAL_COMMUNITY): Payer: Medicaid Other | Admitting: Hematology

## 2020-09-25 DIAGNOSIS — I4891 Unspecified atrial fibrillation: Secondary | ICD-10-CM

## 2020-09-25 DIAGNOSIS — I509 Heart failure, unspecified: Secondary | ICD-10-CM

## 2020-09-25 LAB — BASIC METABOLIC PANEL
Anion gap: 5 (ref 5–15)
BUN: 11 mg/dL (ref 8–23)
CO2: 40 mmol/L — ABNORMAL HIGH (ref 22–32)
Calcium: 8.8 mg/dL — ABNORMAL LOW (ref 8.9–10.3)
Chloride: 93 mmol/L — ABNORMAL LOW (ref 98–111)
Creatinine, Ser: 1.23 mg/dL (ref 0.61–1.24)
GFR, Estimated: >60 mL/min (ref >60)
Glucose, Bld: 104 mg/dL — ABNORMAL HIGH (ref 70–99)
Potassium: 3.5 mmol/L (ref 3.5–5.1)
Sodium: 138 mmol/L (ref 135–145)

## 2020-09-25 LAB — MAGNESIUM: Magnesium: 1.9 mg/dL (ref 1.7–2.4)

## 2020-09-25 LAB — URIC ACID: Uric Acid, Serum: 7.4 mg/dL (ref 3.7–8.6)

## 2020-09-25 MED ORDER — POTASSIUM CHLORIDE CRYS ER 20 MEQ PO TBCR
40.0000 meq | EXTENDED_RELEASE_TABLET | Freq: Once | ORAL | Status: AC
  Administered 2020-09-25: 40 meq via ORAL
  Filled 2020-09-25: qty 2

## 2020-09-25 MED ORDER — SPIRONOLACTONE 12.5 MG HALF TABLET: 12.5000 mg | ORAL_TABLET | Freq: Once | ORAL | Status: AC

## 2020-09-25 MED ORDER — SPIRONOLACTONE 25 MG PO TABS
25.0000 mg | ORAL_TABLET | Freq: Every day | ORAL | Status: DC
  Administered 2020-09-26 – 2020-09-28 (×3): 25 mg via ORAL
  Filled 2020-09-25 (×4): qty 1

## 2020-09-25 MED ORDER — PREDNISONE 20 MG PO TABS
20.0000 mg | ORAL_TABLET | Freq: Every day | ORAL | Status: AC
  Administered 2020-09-25 – 2020-09-27 (×3): 20 mg via ORAL
  Filled 2020-09-25 (×3): qty 1

## 2020-09-25 MED ORDER — FUROSEMIDE 10 MG/ML IJ SOLN
80.0000 mg | Freq: Every day | INTRAMUSCULAR | Status: DC
  Administered 2020-09-26: 80 mg via INTRAVENOUS
  Filled 2020-09-25: qty 8

## 2020-09-25 MED ORDER — MAGNESIUM OXIDE -MG SUPPLEMENT 400 (240 MG) MG PO TABS
800.0000 mg | ORAL_TABLET | Freq: Once | ORAL | Status: AC
  Administered 2020-09-25: 800 mg via ORAL
  Filled 2020-09-25: qty 2

## 2020-09-25 MED ORDER — FUROSEMIDE 40 MG PO TABS: 40.0000 mg | ORAL_TABLET | Freq: Two times a day (BID) | ORAL | Status: DC

## 2020-09-25 NOTE — Progress Notes (Signed)
Heart Failure Stewardship Pharmacist Progress Note   PCP: Pcp, No PCP-Cardiologist: None    HPI:  65 yo M with PMH of CHF, afib/flutter, HTN, HLD, T2DM, CKD III, PE, and obesity. He presented to the ED on 8/6 with LE edema and shortness of breath x 2 months. Recent admission for HF exacerbation from 7/29-8/3. CXR with no active cardiopulmonary disease. ECHO last done on 7/30 and LVEF 45-50% (60-65% in 04/2015).  Current HF Medications: Furosemide 80 mg IV BID Carvedilol 6.25 mg BID Entresto 24/26 mg BID Spironolactone 12.5 mg daily  Prior to admission HF Medications: Furosemide 40 mg BID Carvedilol 3.125 mg BID  Pertinent Lab Values: Serum creatinine 1.23, BUN 11, Potassium 3.5, Sodium 138, BNP 893, Magnesium 1.9 A1c 5.5 (09/13/20)  Vital Signs: Weight: 246 lbs (admission weight: 272 lbs) Blood pressure: 110/70s  Heart rate: 70s   Medication Assistance / Insurance Benefits Check: Does the patient have prescription insurance?  Yes Type of insurance plan: [place] Medicaid  Outpatient Pharmacy:  Prior to admission outpatient pharmacy: [facility] Is the patient willing to use [facility] at discharge? Yes Is the patient willing to transition their outpatient pharmacy to utilize a [facility] outpatient pharmacy?   Pending    Assessment: 1. Acute on chronic systolic and diastolic CHF (EF [id number]), due to presumed NICM. NYHA class II symptoms. - Continue furosemide 80 mg IV BID - Continue carvedilol 6.25 mg BID - Contiue Entresto 24/26 mg BID - Continue spironolactone 12.5 mg daily - Consider starting Farxiga 10 mg daily    Plan: 1) Medication changes recommended at this time: - Start Farxiga 10 mg daily  2) Patient assistance: - Patient has [place] Medicaid - all copays should be $0-3 each per month Delene Loll, Jardiance/Farxiga all require prior authorizations with traditional [place] Medicaid plan - will complete Entresto prior authorization today. Can complete Wilder Glade prior  authorization once added. - HF TOC appt scheduled for 8/15  3)  Education  - To be completed prior to discharge  Kerby Nora, PharmD, BCPS Heart Failure Stewardship Pharmacist Phone [phone]

## 2020-09-25 NOTE — Discharge Instructions (Addendum)

## 2020-09-25 NOTE — Progress Notes (Signed)
Progress Note  Patient Name: Dennis Swanson Date of Encounter: 09/25/2020  [facility] Cardiologist: previously sees Dr. Harl Bowie (lost to follow up for 3 years)}    Subjective   Patient notes worsening toe pain. Improved SOB and improved LE swelling.  Inpatient Medications    Scheduled Meds:  apixaban  5 mg Oral BID   carvedilol  6.25 mg Oral BID WC   ferrous sulfate  325 mg Oral Q breakfast   [START ON 09/26/2020] furosemide  40 mg Oral BID   pantoprazole  40 mg Oral Daily   sacubitril-valsartan  1 tablet Oral BID   spironolactone  12.5 mg Oral Daily   Continuous Infusions:  PRN Meds: acetaminophen **OR** acetaminophen, dextromethorphan-guaiFENesin, hydrALAZINE, metoprolol tartrate, senna-docusate   Vital Signs    Vitals:   09/24/20 1700 09/24/20 1823 09/24/20 2010 09/25/20 0414  BP: 119/74 96/64 (!) 97/57 117/73  Pulse: 69   78  Resp:   16 16  Temp:   97.8 F (36.6 C) 98.3 F (36.8 C)  TempSrc:   Oral Oral  SpO2:    97%  Weight:    111.9 kg  Height:        Intake/Output Summary (Last 24 hours) at 09/25/2020 0931 Last data filed at 09/25/2020 0920 Gross per 24 hour  Intake 1072 ml  Output 1725 ml  Net -653 ml   Last 3 Weights 09/25/2020 09/24/2020 09/23/2020  Weight (lbs) 246 lb 9.6 oz 260 lb 3.2 oz 270 lb 8 oz  Weight (kg) 111.857 kg 118.026 kg 122.698 kg      Telemetry    Atrial Flutter   - Personally Reviewed  ECG    N/A this AM - Personally Reviewed  Physical Exam   GEN: No acute distress.   Neck: No JVD Cardiac: RRR, no murmurs, rubs, or gallops.  Respiratory: Clear to auscultation bilaterally. On room air.  GI: Soft, nontender, non-distended  MS: 1- BLE edema; No deformity. Neuro:  Nonfocal  Psych: Normal affect   Labs    High Sensitivity Troponin:   Recent Labs  Lab 09/13/20 1213 09/13/20 1412 09/21/20 1527 09/21/20 1713  TROPONINIHS 27* 28* 21* 20*      Chemistry Recent Labs  Lab 09/21/20 1527 09/22/20 0406  09/23/20 0256 09/24/20 0232 09/25/20 0459  NA 138   < > 140 140 138  K 4.1   < > 3.7 4.1 3.5  CL 100   < > 98 97* 93*  CO2 34*   < > 38* 39* 40*  GLUCOSE 111*   < > 108* 99 104*  BUN 8   < > '12 12 11  '$ CREATININE 1.19   < > 1.24 1.19 1.23  CALCIUM 8.4*   < > 8.5* 8.9 8.8*  PROT 5.9*  --   --   --   --   ALBUMIN 2.9*  --   --   --   --   AST 21  --   --   --   --   ALT 18  --   --   --   --   ALKPHOS 70  --   --   --   --   BILITOT 0.4  --   --   --   --   GFRNONAA >60   < > >60 >60 >60  ANIONGAP 4*   < > 4* 4* 5   < > = values in this interval not displayed.     Hematology  Recent Labs  Lab 09/21/20 1527  WBC 6.5  RBC 2.72*  HGB 7.6*  HCT 26.3*  MCV 96.7  MCH 27.9  MCHC 28.9*  RDW 16.6*  PLT 233    BNP Recent Labs  Lab 09/21/20 1527  BNP 893.0*     DDimer No results for input(s): DDIMER in the last 168 hours.   Radiology    No results found.  Cardiac Studies   Echo from 09/14/20:   1. Right ventricular systolic function is severely reduced. The right  ventricular size is severely enlarged. There is moderately elevated  pulmonary artery systolic pressure. The estimated right ventricular  systolic pressure is 123XX123 mmHg.   2. 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   3. Left ventricular ejection fraction, by estimation, is 45 to 50%. The  left ventricle has low normal function. The left ventricle demonstrates  global hypokinesis. There is moderate left ventricular hypertrophy. Left  ventricular diastolic parameters are   indeterminate. There is the interventricular septum is flattened in  systole and diastole, consistent with right ventricular pressure and  volume overload.   4. Left atrial size was severely dilated.   5. Right atrial size was severely dilated.   6. The mitral valve is grossly normal. Mild mitral valve regurgitation.  No evidence of mitral stenosis.   7. The aortic valve is tricuspid. There is mild  calcification of the  aortic valve. Aortic valve regurgitation is trivial. No aortic stenosis is  present.   8. Aortic dilatation noted. There is mild dilatation of the ascending  aorta, measuring 40 mm.   9. The inferior vena cava is dilated in size with <50% respiratory  variability, suggesting right atrial pressure of 15 mmHg.  10. Cannot exclude small PFO.    Patient Profile   SHAWNELL Swanson is a 65 y.o. 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   Assessment & Plan    Acute hypoxic respiratory failure -Desaturation to 88% with ambulation yesterday, oxygen as needed, currently weaned to room air, CHF management as below.  Acute on chronic systolic and diastolic heart failure - Presented with worsening SOB and orthopnea, last hospitalization for CHF 08/2020 - Appears drinking too much water and still uses ETOH - BNP 893 POA, was 244 in 2018  -Recent echocardiogram from 09/14/2020 showed EF down to 45 to 50%, severely enlarged RV, RV systolic pressure elevated at 56.5 mmHg, severe biatrial enlargement, moderate LVH, mild MR, severe TR secondary to annular dilatation, mild dilatation of the ascending aorta measuring at 40 mm. - GDMT: increased carvedilol 6.25 mg twice daily, started spironolactone increase to 25 mg daily,  Entresto 24/'26mg'$  BID, will add Farxiga '10mg'$  potentially be tomorrow, pharmacy to assist cost barriers, will need repeat BMP in 1-2 weeks outpatient  - follow up with HF impact clinic on 09/30/20 and cardiology on 10/16/20 arranged  - consider outpatient sleep study and PFTs given severe RV enlargement   Persistent atrial fibrillation/ flutter -Rate was controlled on diltiazem and metoprolol previously, both were discontinued from recent  hospitalization in July 2022 and transition to carvedilol -Rate is controlled currently, in and out of A fib/flutter, without symptoms, continue Coreg and anticoagulation with Eliquis  HTN - BP controlled, continue Coreg 6.25 mg twice daily, spironolactone  12.5 mg, Entresto 24/26 BID   HLD - previously on Lipitor 40 mg daily, LDL is 44, OK remain off statin for now   Type 2 diabetes Iron deficiency anemia History of recent GI bleed 08/2020 - managed per IM        For questions or updates, please contact [place] Please consult [email or web address] for contact info under        Signed, Margie Billet, NP  09/25/2020, 9:31 AM    Personally seen and examined. Agree with APP above with the following comments and with changes made to the above: Briefly 65 yo with improving HF with uptitration of GDMT - persistent AFL outpatient eval for SR transition - one more day of IV lasix; potential SGLT2i form 8/11 - increase Aldactone with low K today  Rudean Haskell, MD Cardiologist [facility]  [address], [address] [place], [place] [address] [phone]  9:53 AM

## 2020-09-25 NOTE — Progress Notes (Addendum)
Heart Failure Patient Advocate Encounter   Received notification from [employer] Medicaid that prior authorization for Dennis Swanson is required.   PA submitted on [employer] Confirmation # [id number] W Status is approved through 09/20/21  Copay $4 per month  Kerby Nora, PharmD, BCPS Heart Failure Stewardship Pharmacist Phone [phone]  Please check [email or web address] for unit-specific pharmacist phone numbers

## 2020-09-25 NOTE — Progress Notes (Signed)
PROGRESS NOTE    Dennis Swanson  [id number] DOB: 1955-03-18 DOA: 09/21/2020 PCP: Pcp, No   Brief Narrative:  65 y.o. male with medical history significant of chronic combined CHF, A. fib on Eliquis, type 2 diabetes, hypertension, hyperlipidemia, CKD stage IIIa, obesity, history of PE in 2011, anal mass, hemorrhoids, diverticulosis, gastritis, iron deficiency anemia, recently admitted for GI bleed.  He presents to the ED with complaints of shortness of breath and bilateral lower extremity edema.  Admitted for CHF exacerbation.  Patient started on IV Lasix to which she is responding well.  Cardiology consulted for medication adjustment.  Concerns for gout flare therefore started on prednisone.   Assessment & Plan:   Principal Problem:   Acute CHF (congestive heart failure) (HCC) Active Problems:   Atrial fibrillation (HCC)   CKD (chronic kidney disease) stage 3, GFR 30-59 ml/min (HCC)   Acute hypoxemic respiratory failure (HCC)   Elevated troponin   Acute on chronic combined CHF, EF 45%, severe TR.  Class class II -Echo done 09/14/2020 showing EF 45 to 50%, global hypokinesis, severe tricuspid regurgitation, RVSP 56.5.  Renal function is stable.  Still has significant edema but improving in his lower extremity and thighs. -Continue IV Lasix 80 mg twice daily, Aldactone and Coreg added. - Cardiology following for medical management    Mild troponin elevation -Likely from demand ischemia.  Continue to monitor  Right great toe pain suspect gout flare - Prednisone 20 mg X 3 days.  Check uric acid Tells me he used to be on allopurinol   Paroxysmal A. Fib -Continue Coreg and Eliquis   Diet controlled type 2 diabetes -Hemoglobin A1c 5.5 (09/13/2020)-diet controlled   Hypertension -Continue Coreg and Lasix    Iron deficiency anemia - Iron supplements with bowel regimen. -Baseline hemoglobin 7.5.  Recent GI bleed, 08/2020 - Colonoscopy showed anal mass, hemorrhoids.    -EGD-gastritis/hiatal hernia - Pathology-negative for H. pylori, polypoid rectal mucosa with acute/chronic inflammation from rectal ulcer but no evidence of dysplasia or malignancy   Patient does not have CKD, GFR is greater than 60.  Baseline creatinine is around 1.2  DVT prophylaxis:  apixaban (ELIQUIS) tablet 5 mg  Code Status: Full code Family Communication:    Status is: Inpatient  Remains inpatient appropriate because:Inpatient level of care appropriate due to severity of illness  Dispo: The patient is from: Home              Anticipated d/c is to: Home              Patient currently is not medically stable to d/c.  Patient is still third spacing quite a bit of fluids, requiring IV diuretics.  Maintain hospital stay for aggressive diuresis and further cardiac medication adjustment.   Difficult to place patient No        Subjective: Lower extremity swelling and exertional shortness of breath is improved.  Still has fluid in his lower extremities and in his thighs. Today he reports of right toe pain on the plantar surface.  He does have history of gout and tells me he used to be on allopurinol at some point  Review of Systems Otherwise negative except as per HPI, including: General: Denies fever, chills, night sweats or unintended weight loss. Resp: Denies cough, wheezing, shortness of breath. Cardiac: Denies chest pain, palpitations, orthopnea, paroxysmal nocturnal dyspnea. GI: Denies abdominal pain, nausea, vomiting, diarrhea or constipation GU: Denies dysuria, frequency, hesitancy or incontinence MS: Denies muscle aches,  swelling Neuro: Denies  headache, neurologic deficits (focal weakness, numbness, tingling), abnormal gait Psych: Denies anxiety, depression, SI/HI/AVH Skin: Denies new rashes or lesions ID: Denies sick contacts, exotic exposures, travel  Examination:  Constitutional: Not in acute distress Respiratory: Clear to auscultation  bilaterally Cardiovascular: Normal sinus rhythm, no rubs Abdomen: Nontender nondistended good bowel sounds Musculoskeletal: trace - 1+ pitting edema in his lower extremity/thighs Skin: No rashes seen Neurologic: CN 2-12 grossly intact.  And nonfocal Psychiatric: Normal judgment and insight. Alert and oriented x 3. Normal mood.  Objective: Vitals:   09/24/20 1700 09/24/20 1823 09/24/20 2010 09/25/20 0414  BP: 119/74 96/64 (!) 97/57 117/73  Pulse: 69   78  Resp:   16 16  Temp:   97.8 F (36.6 C) 98.3 F (36.8 C)  TempSrc:   Oral Oral  SpO2:    97%  Weight:    111.9 kg  Height:        Intake/Output Summary (Last 24 hours) at 09/25/2020 0937 Last data filed at 09/25/2020 0920 Gross per 24 hour  Intake 1072 ml  Output 1725 ml  Net -653 ml   Filed Weights   09/23/20 0405 09/24/20 0611 09/25/20 0414  Weight: 122.7 kg 118 kg 111.9 kg     Data Reviewed:   CBC: Recent Labs  Lab 09/21/20 1527  WBC 6.5  NEUTROABS 4.7  HGB 7.6*  HCT 26.3*  MCV 96.7  PLT 0000000   Basic Metabolic Panel: Recent Labs  Lab 09/21/20 1527 09/22/20 0406 09/23/20 0256 09/24/20 0232 09/25/20 0459  NA 138 140 140 140 138  K 4.1 3.5 3.7 4.1 3.5  CL 100 100 98 97* 93*  CO2 34* 35* 38* 39* 40*  GLUCOSE 111* 114* 108* 99 104*  BUN '8 8 12 12 11  '$ CREATININE 1.19 1.27* 1.24 1.19 1.23  CALCIUM 8.4* 8.4* 8.5* 8.9 8.8*  MG  --   --  1.8 1.9 1.9   GFR: Estimated Creatinine Clearance: 76.3 mL/min (by C-G formula based on SCr of 1.23 mg/dL). Liver Function Tests: Recent Labs  Lab 09/21/20 1527  AST 21  ALT 18  ALKPHOS 70  BILITOT 0.4  PROT 5.9*  ALBUMIN 2.9*   No results for input(s): LIPASE, AMYLASE in the last 168 hours. No results for input(s): AMMONIA in the last 168 hours. Coagulation Profile: No results for input(s): INR, PROTIME in the last 168 hours. Cardiac Enzymes: No results for input(s): CKTOTAL, CKMB, CKMBINDEX, TROPONINI in the last 168 hours. BNP (last 3 results) No results  for input(s): PROBNP in the last 8760 hours. HbA1C: No results for input(s): HGBA1C in the last 72 hours. CBG: No results for input(s): GLUCAP in the last 168 hours.  Lipid Profile: Recent Labs    09/24/20 0232  CHOL 91  HDL 35*  LDLCALC 44  TRIG 61  CHOLHDL 2.6   Thyroid Function Tests: No results for input(s): TSH, T4TOTAL, FREET4, T3FREE, THYROIDAB in the last 72 hours. Anemia Panel: No results for input(s): VITAMINB12, FOLATE, FERRITIN, TIBC, IRON, RETICCTPCT in the last 72 hours. Sepsis Labs: No results for input(s): PROCALCITON, LATICACIDVEN in the last 168 hours.  Recent Results (from the past 240 hour(s))  Resp Panel by RT-PCR (Flu A&B, Covid) Nasopharyngeal Swab     Status: None   Collection Time: 09/21/20 10:40 PM   Specimen: Nasopharyngeal Swab; Nasopharyngeal(NP) swabs in vial transport medium  Result Value Ref Range Status   SARS Coronavirus 2 by RT PCR NEGATIVE NEGATIVE Final    Comment: (NOTE) SARS-CoV-2 target  nucleic acids are NOT DETECTED.  The SARS-CoV-2 RNA is generally detectable in upper respiratory specimens during the acute phase of infection. The lowest concentration of SARS-CoV-2 viral copies this assay can detect is 138 copies/mL. A negative result does not preclude SARS-Cov-2 infection and should not be used as the sole basis for treatment or other patient management decisions. A negative result may occur with  improper specimen collection/handling, submission of specimen other than nasopharyngeal swab, presence of viral mutation(s) within the areas targeted by this assay, and inadequate number of viral copies(<138 copies/mL). A negative result must be combined with clinical observations, patient history, and epidemiological information. The expected result is Negative.  Fact Sheet for Patients:  EntrepreneurPulse.com.au  Fact Sheet for Healthcare Providers:  IncredibleEmployment.be  This test is no t  yet approved or cleared by the [place] FDA and  has been authorized for detection and/or diagnosis of SARS-CoV-2 by FDA under an Emergency Use Authorization (EUA). This EUA will remain  in effect (meaning this test can be used) for the duration of the COVID-19 declaration under Section 564(b)(1) of the Act, 21 U.S.C.section 360bbb-3(b)(1), unless the authorization is terminated  or revoked sooner.       Influenza A by PCR NEGATIVE NEGATIVE Final   Influenza B by PCR NEGATIVE NEGATIVE Final    Comment: (NOTE) The Xpert Xpress SARS-CoV-2/FLU/RSV plus assay is intended as an aid in the diagnosis of influenza from Nasopharyngeal swab specimens and should not be used as a sole basis for treatment. Nasal washings and aspirates are unacceptable for Xpert Xpress SARS-CoV-2/FLU/RSV testing.  Fact Sheet for Patients: EntrepreneurPulse.com.au  Fact Sheet for Healthcare Providers: IncredibleEmployment.be  This test is not yet approved or cleared by the [place] FDA and has been authorized for detection and/or diagnosis of SARS-CoV-2 by FDA under an Emergency Use Authorization (EUA). This EUA will remain in effect (meaning this test can be used) for the duration of the COVID-19 declaration under Section 564(b)(1) of the Act, 21 U.S.C. section 360bbb-3(b)(1), unless the authorization is terminated or revoked.  Performed at [facility], [address], [place], [place] [address]          Radiology Studies: No results found.      Scheduled Meds:  apixaban  5 mg Oral BID   carvedilol  6.25 mg Oral BID WC   ferrous sulfate  325 mg Oral Q breakfast   [START ON 09/26/2020] furosemide  40 mg Oral BID   pantoprazole  40 mg Oral Daily   predniSONE  20 mg Oral Q breakfast   sacubitril-valsartan  1 tablet Oral BID   spironolactone  12.5 mg Oral Once   [START ON 09/26/2020] spironolactone  25 mg Oral Daily   Continuous  Infusions:   LOS: 4 days   Time spent= 35 mins    Xavious Sharrar Arsenio Loader, MD [facility]  If 7PM-7AM, please contact night-coverage  09/25/2020, 9:37 AM

## 2020-09-26 ENCOUNTER — Other Ambulatory Visit (HOSPITAL_COMMUNITY): Payer: Self-pay

## 2020-09-26 DIAGNOSIS — I5041 Acute combined systolic (congestive) and diastolic (congestive) heart failure: Secondary | ICD-10-CM

## 2020-09-26 LAB — BASIC METABOLIC PANEL
Anion gap: 6 (ref 5–15)
BUN: 10 mg/dL (ref 8–23)
CO2: 36 mmol/L — ABNORMAL HIGH (ref 22–32)
Calcium: 8.6 mg/dL — ABNORMAL LOW (ref 8.9–10.3)
Chloride: 98 mmol/L (ref 98–111)
Creatinine, Ser: 1.09 mg/dL (ref 0.61–1.24)
GFR, Estimated: >60 mL/min (ref >60)
Glucose, Bld: 98 mg/dL (ref 70–99)
Potassium: 3.8 mmol/L (ref 3.5–5.1)
Sodium: 140 mmol/L (ref 135–145)

## 2020-09-26 LAB — MAGNESIUM: Magnesium: 2 mg/dL (ref 1.7–2.4)

## 2020-09-26 MED ORDER — FUROSEMIDE 10 MG/ML IJ SOLN
80.0000 mg | Freq: Two times a day (BID) | INTRAMUSCULAR | Status: DC
  Administered 2020-09-26 – 2020-09-27 (×2): 80 mg via INTRAVENOUS
  Filled 2020-09-26 (×2): qty 8

## 2020-09-26 MED ORDER — POTASSIUM CHLORIDE CRYS ER 20 MEQ PO TBCR
40.0000 meq | EXTENDED_RELEASE_TABLET | Freq: Every day | ORAL | Status: DC
  Administered 2020-09-26 – 2020-09-29 (×4): 40 meq via ORAL
  Filled 2020-09-26 (×4): qty 2

## 2020-09-26 MED ORDER — DAPAGLIFLOZIN PROPANEDIOL 10 MG PO TABS
10.0000 mg | ORAL_TABLET | Freq: Every day | ORAL | Status: DC
  Administered 2020-09-26 – 2020-09-29 (×4): 10 mg via ORAL
  Filled 2020-09-26 (×6): qty 1

## 2020-09-26 NOTE — Progress Notes (Signed)
Heart Failure Stewardship Pharmacist Progress Note   PCP: Pcp, No PCP-Cardiologist: None    HPI:  65 yo M with PMH of CHF, afib/flutter, HTN, HLD, T2DM, CKD III, PE, and obesity. He presented to the ED on 8/6 with LE edema and shortness of breath x 2 months. Recent admission for HF exacerbation from 7/29-8/3. CXR with no active cardiopulmonary disease. ECHO last done on 7/30 and LVEF 45-50% (60-65% in 04/2015).  Current HF Medications: Furosemide 80 mg IV BID Carvedilol 6.25 mg BID Entresto 24/26 mg BID Spironolactone 25 mg daily Farxiga 10 mg daily  Prior to admission HF Medications: Furosemide 40 mg BID Carvedilol 3.125 mg BID  Pertinent Lab Values: Serum creatinine 1.09, BUN 10, Potassium 3.8, Sodium 140, BNP 893, Magnesium 2.0 A1c 5.5 (09/13/20)  Vital Signs: Weight: 244 lbs (admission weight: 272 lbs) Blood pressure: 110/60s  Heart rate: 70-90s   Medication Assistance / Insurance Benefits Check: Does the patient have prescription insurance?  Yes Type of insurance plan: [place] Medicaid  Outpatient Pharmacy:  Prior to admission outpatient pharmacy: [facility] Is the patient willing to use [facility] at discharge? Yes Is the patient willing to transition their outpatient pharmacy to utilize a [facility] outpatient pharmacy?   Pending    Assessment: 1. Acute on chronic systolic and diastolic CHF (EF [id number]), due to presumed NICM. NYHA class II symptoms. - Increasing to furosemide 80 mg IV BID - Continue carvedilol 6.25 mg BID - Contiue Entresto 24/26 mg BID - Continue spironolactone 25 mg daily - Agree with starting Farxiga 10 mg daily    Plan: 1) Medication changes recommended at this time: - Agree with changes as above  2) Patient assistance: - Patient has [place] Medicaid - all copays should be $0-4 each per month - Entresto prior authorization approved - Farxiga prior authorization will be completed today - HF TOC appt scheduled for 8/15  3)  Education  - To be  completed prior to discharge  Kerby Nora, PharmD, BCPS Heart Failure Cytogeneticist Phone [phone]

## 2020-09-26 NOTE — Progress Notes (Addendum)
Heart Failure Patient Advocate Encounter   Received notification from [facility] Medicaid that prior authorization for Dennis Swanson is required.   PA submitted on [employer] Confirmation # [id number] W Status is approved through 09/26/2021  Copay $4 per month  Kerby Nora, PharmD, BCPS Heart Failure Stewardship Pharmacist Phone [phone]  Please check [email or web address] for unit-specific pharmacist phone numbers

## 2020-09-26 NOTE — Plan of Care (Signed)
No complaints of pain. Pt is independent. Neuro intact. RA no dyspnea. Ate 100% of all meals. Voiding is adequate responding well to IV lasix. Observation overnight possible d/c tomorrow. PIV R hand is patent.    Problem: Education: Goal: Knowledge of General Education information will improve Description: Including pain rating scale, medication(s)/side effects and non-pharmacologic comfort measures Outcome: Progressing   Problem: Health Behavior/Discharge Planning: Goal: Ability to manage health-related needs will improve Outcome: Progressing   Problem: Clinical Measurements: Goal: Ability to maintain clinical measurements within normal limits will improve Outcome: Progressing Goal: Diagnostic test results will improve Outcome: Progressing   Problem: Activity: Goal: Risk for activity intolerance will decrease Outcome: Progressing   Problem: Nutrition: Goal: Adequate nutrition will be maintained Outcome: Progressing   Problem: Elimination: Goal: Will not experience complications related to bowel motility Outcome: Progressing   Problem: Pain Managment: Goal: General experience of comfort will improve Outcome: Progressing

## 2020-09-26 NOTE — Progress Notes (Signed)
Progress Note  Patient Name: Dennis Swanson Date of Encounter: 09/26/2020  [facility] Cardiologist: previously sees Dr. Harl Bowie (lost to follow up for 3 years)}    Subjective   Toe pain has improved.  Breathing has improved.  LE has improved.  Inpatient Medications    Scheduled Meds:  apixaban  5 mg Oral BID   carvedilol  6.25 mg Oral BID WC   ferrous sulfate  325 mg Oral Q breakfast   furosemide  80 mg Intravenous BID   pantoprazole  40 mg Oral Daily   predniSONE  20 mg Oral Q breakfast   sacubitril-valsartan  1 tablet Oral BID   spironolactone  25 mg Oral Daily   Continuous Infusions:  PRN Meds: acetaminophen **OR** acetaminophen, dextromethorphan-guaiFENesin, hydrALAZINE, metoprolol tartrate, senna-docusate   Vital Signs    Vitals:   09/25/20 1950 09/25/20 2126 09/26/20 0503 09/26/20 0800  BP: 103/65 103/62 118/66 117/69  Pulse: 78  86 82  Resp: '14  18 16  '$ Temp: 99.2 F (37.3 C)  98.5 F (36.9 C) 98.3 F (36.8 C)  TempSrc: Oral  Oral Oral  SpO2:   99%   Weight:   110.8 kg   Height:        Intake/Output Summary (Last 24 hours) at 09/26/2020 1014 Last data filed at 09/26/2020 0839 Gross per 24 hour  Intake 960 ml  Output 700 ml  Net 260 ml   Last 3 Weights 09/26/2020 09/25/2020 09/24/2020  Weight (lbs) 244 lb 3.2 oz 246 lb 9.6 oz 260 lb 3.2 oz  Weight (kg) 110.768 kg 111.857 kg 118.026 kg      Telemetry    Atrial Flutter- some episodes look sinus in nature   - Personally Reviewed  ECG    None since 09/23/20 - Personally Reviewed  Physical Exam   GEN: No acute distress.   Neck: No JVD at 90 degrees Cardiac: RRR, no murmurs, rubs, or gallops.  Respiratory: Clear to auscultation bilaterally.  GI: Soft, nontender, non-distended  MS: 1+ BLE edema; No deformity. Neuro:  Nonfocal  Psych: Normal affect   Labs    High Sensitivity Troponin:   Recent Labs  Lab 09/13/20 1213 09/13/20 1412 09/21/20 1527 09/21/20 1713  TROPONINIHS 27* 28* 21*  20*      Chemistry Recent Labs  Lab 09/21/20 1527 09/22/20 0406 09/24/20 0232 09/25/20 0459 09/26/20 0338  NA 138   < > 140 138 140  K 4.1   < > 4.1 3.5 3.8  CL 100   < > 97* 93* 98  CO2 34*   < > 39* 40* 36*  GLUCOSE 111*   < > 99 104* 98  BUN 8   < > '12 11 10  '$ CREATININE 1.19   < > 1.19 1.23 1.09  CALCIUM 8.4*   < > 8.9 8.8* 8.6*  PROT 5.9*  --   --   --   --   ALBUMIN 2.9*  --   --   --   --   AST 21  --   --   --   --   ALT 18  --   --   --   --   ALKPHOS 70  --   --   --   --   BILITOT 0.4  --   --   --   --   GFRNONAA >60   < > >60 >60 >60  ANIONGAP 4*   < > 4* 5 6   < > =  values in this interval not displayed.     Hematology Recent Labs  Lab 09/21/20 1527  WBC 6.5  RBC 2.72*  HGB 7.6*  HCT 26.3*  MCV 96.7  MCH 27.9  MCHC 28.9*  RDW 16.6*  PLT 233    BNP Recent Labs  Lab 09/21/20 1527  BNP 893.0*     DDimer No results for input(s): DDIMER in the last 168 hours.   Radiology    No results found.  Cardiac Studies   Echo from 09/14/20:   1. Right ventricular systolic function is severely reduced. The right  ventricular size is severely enlarged. There is moderately elevated  pulmonary artery systolic pressure. The estimated right ventricular  systolic pressure is 123XX123 mmHg.   2. 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   3. Left ventricular ejection fraction, by estimation, is 45 to 50%. The  left ventricle has low normal function. The left ventricle demonstrates  global hypokinesis. There is moderate left ventricular hypertrophy. Left  ventricular diastolic parameters are   indeterminate. There is the interventricular septum is flattened in  systole and diastole, consistent with right ventricular pressure and  volume overload.   4. Left atrial size was severely dilated.   5. Right atrial size was severely dilated.   6. The mitral valve is grossly normal. Mild mitral valve regurgitation.  No evidence of  mitral stenosis.   7. The aortic valve is tricuspid. There is mild calcification of the  aortic valve. Aortic valve regurgitation is trivial. No aortic stenosis is  present.   8. Aortic dilatation noted. There is mild dilatation of the ascending  aorta, measuring 40 mm.   9. The inferior vena cava is dilated in size with <50% respiratory  variability, suggesting right atrial pressure of 15 mmHg.  10. Cannot exclude small PFO.    Patient Profile   Dennis Swanson is a 65 y.o. 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   Assessment & Plan    Acute hypoxic respiratory failure Acute on chronic systolic and diastolic heart failure HTN - increase to lasix 80 IV BID - GDMT: carvedilol 6.25 mg twice daily, started spironolactone 25 mg daily,  Entresto 24/'26mg'$  BID, will add Farxiga '10mg'$ ;  pharmacy to assist cost barriers, will need repeat BMP in 1-2 weeks outpatient  - follow up with HF impact clinic on 09/30/20 and cardiology on 10/16/20 arranged  - consider outpatient sleep study and PFTs given severe RV enlargement   Persistent atrial fibrillation/ flutter - coreg and eliquis as above - will consider outpatient rhythm control strategy based on alcohol use once DC  Mild TAA - outpatient f/u  HLD - previously on Lipitor 40 mg daily, LDL is 44, OK remain off statin for now   Type 2 diabetes Iron deficiency anemia History of recent GI bleed 08/2020 - managed per IM        For questions or updates, please contact [facility] Please consult [email or web address] for contact info under        Signed, Werner Lean, MD  09/26/2020, 10:14 AM

## 2020-09-26 NOTE — Progress Notes (Signed)
PROGRESS NOTE    Dennis Swanson  [id number] DOB: 1955/09/27 DOA: 09/21/2020 PCP: Pcp, No    Brief Narrative:  [name] was admitted to the hospital working diagnosis of acute on chronic systolic heart failure exacerbation.  Severe tricuspid vegetation.  65 year old male past medical history for heart failure, type 2 diabetes mellitus, hypertension, dyslipidemia, obesity, pulmonary embolism, diverticulosis, iron deficiency anemia who presented with dyspnea.  Reported 3 months of worsening extremity edema, eventually developed dyspnea that prompted him to come to the hospital.  On his initial physical examination his oximetry was 88% on ambulation, his blood pressure 147/97, heart rate 81, respiratory rate 21, oxygen saturation 97%, positive JVD, lungs are clear to auscultation bilaterally, heart S1-S2, present, rhythmic, positive systolic murmur at the apex, 2+ pitting bilateral extremity edema.  Sodium 138, potassium 4.1, chloride 100, bicarb 34, glucose 111, BUN 8, creatinine 1.19, BNP 893, high sensitive troponin 21-20, white count 6.5, hemoglobin 7.6, hematocrit 26.3, platelets 233. SARS COVID-19 negative.  Chest radiograph with hilar vascular congestion.  Elevated right hemidiaphragm.  EKG 78 bpm, left axis deviation, first-degree AV block, sinus rhythm, poor R wave progression, no significant ST segment or T wave changes.  Patient was placed on aggressive diuresis with intravenous furosemide.  Assessment & Plan:   Principal Problem:   Acute CHF (congestive heart failure) (HCC) Active Problems:   Atrial fibrillation (HCC)   CKD (chronic kidney disease) stage 3, GFR 30-59 ml/min (HCC)   Acute hypoxemic respiratory failure (HCC)   Elevated troponin  Acute on chronic systolic heart failure exacerbation. Acute hypoxemic respiratory failure. Patient with improvement in dyspnea and lower extremity edema, but not yet back to baseline. Documented urine output over last 24  hrs is 700 ml  Plan to continue diuresis with IV furosemide  Continue heart failure management with carvedilol, dapagliflozin, metoprolol, entresto and spironolactone.   2. Atrial fibrillation Continue rate control with metoprolol and anticoagulation with apixaban  3. Hypokalemia. Continue K correction with Kcl. Stable renal function with serum cr at 1,0 with serum bicarbonate at 36 and K at 3,8  4. Right great toe acute gout flare. Pain has improved with prednisone, continue allopurinol   5. HTN, continue blood pressure control with metoprolol.   6. Iron deficiency anemia and hx of GI bleed. Hgb stable at 7,5, continue with iron supplementation. Continue ppi.   Patient continue to be at high risk for worsening heart failure   Status is: Inpatient  Remains inpatient appropriate because:Inpatient level of care appropriate due to severity of illness  Dispo: The patient is from: Home              Anticipated d/c is to: Home              Patient currently is not medically stable to d/c.   Difficult to place patient No  DVT prophylaxis: Apixaban   Code Status:    full  Family Communication:   No family at the bedside      Consultants:  Cardiology   Subjective: Patient is feeling better but not yet back to baseline, continue to have lower extremity edema,   Objective: Vitals:   09/25/20 2126 09/26/20 0503 09/26/20 0800 09/26/20 1059  BP: 103/62 118/66 117/69 (!) 95/59  Pulse:  86 82 84  Resp:  '18 16 17  '$ Temp:  98.5 F (36.9 C) 98.3 F (36.8 C) 98 F (36.7 C)  TempSrc:  Oral Oral Oral  SpO2:  99%  99%  Weight:  110.8 kg    Height:        Intake/Output Summary (Last 24 hours) at 09/26/2020 1450 Last data filed at 09/26/2020 1100 Gross per 24 hour  Intake 838 ml  Output 2250 ml  Net -1412 ml   Filed Weights   09/24/20 0611 09/25/20 0414 09/26/20 0503  Weight: 118 kg 111.9 kg 110.8 kg    Examination:   General: Not in pain or dyspnea, deconditioned   Neurology: Awake and alert, non focal  E ENT: no pallor, no icterus, oral mucosa moist Cardiovascular: No JVD. S1-S2 present, rhythmic, no gallops, rubs, or murmurs. trace lower extremity edema. Pulmonary: positive breath sounds bilaterally, adequate air movement, no wheezing, rhonchi or rales. Gastrointestinal. Abdomen soft and non tender Skin. No rashes Musculoskeletal: no joint deformities     Data Reviewed: I have personally reviewed following labs and imaging studies  CBC: Recent Labs  Lab 09/21/20 1527  WBC 6.5  NEUTROABS 4.7  HGB 7.6*  HCT 26.3*  MCV 96.7  PLT 0000000   Basic Metabolic Panel: Recent Labs  Lab 09/22/20 0406 09/23/20 0256 09/24/20 0232 09/25/20 0459 09/26/20 0338  NA 140 140 140 138 140  K 3.5 3.7 4.1 3.5 3.8  CL 100 98 97* 93* 98  CO2 35* 38* 39* 40* 36*  GLUCOSE 114* 108* 99 104* 98  BUN '8 12 12 11 10  '$ CREATININE 1.27* 1.24 1.19 1.23 1.09  CALCIUM 8.4* 8.5* 8.9 8.8* 8.6*  MG  --  1.8 1.9 1.9 2.0   GFR: Estimated Creatinine Clearance: 85.7 mL/min (by C-G formula based on SCr of 1.09 mg/dL). Liver Function Tests: Recent Labs  Lab 09/21/20 1527  AST 21  ALT 18  ALKPHOS 70  BILITOT 0.4  PROT 5.9*  ALBUMIN 2.9*   No results for input(s): LIPASE, AMYLASE in the last 168 hours. No results for input(s): AMMONIA in the last 168 hours. Coagulation Profile: No results for input(s): INR, PROTIME in the last 168 hours. Cardiac Enzymes: No results for input(s): CKTOTAL, CKMB, CKMBINDEX, TROPONINI in the last 168 hours. BNP (last 3 results) No results for input(s): PROBNP in the last 8760 hours. HbA1C: No results for input(s): HGBA1C in the last 72 hours. CBG: No results for input(s): GLUCAP in the last 168 hours. Lipid Profile: Recent Labs    09/24/20 0232  CHOL 91  HDL 35*  LDLCALC 44  TRIG 61  CHOLHDL 2.6   Thyroid Function Tests: No results for input(s): TSH, T4TOTAL, FREET4, T3FREE, THYROIDAB in the last 72 hours. Anemia  Panel: No results for input(s): VITAMINB12, FOLATE, FERRITIN, TIBC, IRON, RETICCTPCT in the last 72 hours.    Radiology Studies: I have reviewed all of the imaging during this hospital visit personally     Scheduled Meds:  apixaban  5 mg Oral BID   carvedilol  6.25 mg Oral BID WC   dapagliflozin propanediol  10 mg Oral Daily   ferrous sulfate  325 mg Oral Q breakfast   furosemide  80 mg Intravenous BID   pantoprazole  40 mg Oral Daily   potassium chloride  40 mEq Oral Daily   predniSONE  20 mg Oral Q breakfast   sacubitril-valsartan  1 tablet Oral BID   spironolactone  25 mg Oral Daily   Continuous Infusions:   LOS: 5 days        Reene Harlacher Gerome Apley, MD

## 2020-09-27 DIAGNOSIS — I483 Typical atrial flutter: Secondary | ICD-10-CM

## 2020-09-27 LAB — BASIC METABOLIC PANEL
Anion gap: 6 (ref 5–15)
BUN: 14 mg/dL (ref 8–23)
CO2: 36 mmol/L — ABNORMAL HIGH (ref 22–32)
Calcium: 8.6 mg/dL — ABNORMAL LOW (ref 8.9–10.3)
Chloride: 96 mmol/L — ABNORMAL LOW (ref 98–111)
Creatinine, Ser: 1.25 mg/dL — ABNORMAL HIGH (ref 0.61–1.24)
GFR, Estimated: >60 mL/min (ref >60)
Glucose, Bld: 125 mg/dL — ABNORMAL HIGH (ref 70–99)
Potassium: 3.7 mmol/L (ref 3.5–5.1)
Sodium: 138 mmol/L (ref 135–145)

## 2020-09-27 LAB — MAGNESIUM: Magnesium: 2.2 mg/dL (ref 1.7–2.4)

## 2020-09-27 MED ORDER — FUROSEMIDE 10 MG/ML IJ SOLN
100.0000 mg | Freq: Two times a day (BID) | INTRAVENOUS | Status: DC
  Administered 2020-09-27 – 2020-09-29 (×4): 100 mg via INTRAVENOUS
  Filled 2020-09-27 (×5): qty 10

## 2020-09-27 NOTE — Plan of Care (Signed)
Pt given IV lasix today. Plan for d/c tomorrow after aggressive diuresis UO ~ 2650cc. He has no c/o pain, is independent in the room. No tele. RA no dyspnea. Eats 100% of all meals. 1 BM for day shift. IV patent and unchanged. Pls review flowsheets for details.    Problem: Activity: Goal: Risk for activity intolerance will decrease Outcome: Progressing   Problem: Nutrition: Goal: Adequate nutrition will be maintained Outcome: Progressing   Problem: Coping: Goal: Level of anxiety will decrease Outcome: Progressing   Problem: Elimination: Goal: Will not experience complications related to bowel motility Outcome: Progressing

## 2020-09-27 NOTE — Progress Notes (Signed)
Heart Failure Stewardship Pharmacist Progress Note   PCP: Pcp, No PCP-Cardiologist: None    HPI:  65 yo M with PMH of CHF, afib/flutter, HTN, HLD, T2DM, CKD III, PE, and obesity. He presented to the ED on 8/6 with LE edema and shortness of breath x 2 months. Recent admission for HF exacerbation from 7/29-8/3. CXR with no active cardiopulmonary disease. ECHO last done on 7/30 and LVEF 45-50% (60-65% in 04/2015).  Current HF Medications: Furosemide 80 mg IV BID Carvedilol 6.25 mg BID Entresto 24/26 mg BID Spironolactone 25 mg daily Farxiga 10 mg daily  Prior to admission HF Medications: Furosemide 40 mg BID Carvedilol 3.125 mg BID  Pertinent Lab Values: Serum creatinine 1.25, BUN 14, Potassium 3.7, Sodium 138, BNP 893, Magnesium 2.2 A1c 5.5 (09/13/20)  Vital Signs: Weight: 236 lbs (admission weight: 272 lbs) Blood pressure: 90-100/60s  Heart rate: 70-90s   Medication Assistance / Insurance Benefits Check: Does the patient have prescription insurance?  Yes Type of insurance plan: [place] Medicaid  Outpatient Pharmacy:  Prior to admission outpatient pharmacy: [facility] Is the patient willing to use [facility] at discharge? Yes Is the patient willing to transition their outpatient pharmacy to utilize a [facility] outpatient pharmacy?   Pending    Assessment: 1. Acute on chronic systolic and diastolic CHF (EF [id number]), due to presumed NICM. NYHA class II symptoms. - Continue furosemide 80 mg IV BID - Continue carvedilol 6.25 mg BID - Contiue Entresto 24/26 mg BID - Continue spironolactone 25 mg daily - Continue Farxiga 10 mg daily    Plan: 1) Medication changes recommended at this time: - Continue current regimen  2) Patient assistance: - Patient has [place] Medicaid - all copays should be $0-4 each per month - Entresto and Farxiga prior authorizations approved - HF TOC appt scheduled for 8/18  3)  Education  - To be completed prior to discharge  Kerby Nora, PharmD,  BCPS Heart Failure Cytogeneticist Phone [phone]

## 2020-09-27 NOTE — Progress Notes (Signed)
Progress Note  Patient Name: Dennis Swanson Date of Encounter: 09/27/2020  [facility] Cardiologist: previously sees Dr. Harl Bowie (lost to follow up for 3 years)}    Subjective   Toe pain has resolved.  Breathing has improved; LE has improved  Inpatient Medications    Scheduled Meds:  apixaban  5 mg Oral BID   carvedilol  6.25 mg Oral BID WC   dapagliflozin propanediol  10 mg Oral Daily   ferrous sulfate  325 mg Oral Q breakfast   pantoprazole  40 mg Oral Daily   potassium chloride  40 mEq Oral Daily   sacubitril-valsartan  1 tablet Oral BID   spironolactone  25 mg Oral Daily   Continuous Infusions:  furosemide     PRN Meds: acetaminophen **OR** acetaminophen, dextromethorphan-guaiFENesin, hydrALAZINE, metoprolol tartrate, senna-docusate   Vital Signs    Vitals:   09/26/20 2027 09/27/20 0005 09/27/20 0407 09/27/20 0822  BP: 95/62 99/73 104/60 97/63  Pulse: 70 80 85 89  Resp: '18 17 17 18  '$ Temp: 98 F (36.7 C) 97.7 F (36.5 C) 98.3 F (36.8 C) 99.4 F (37.4 C)  TempSrc: Oral Oral Oral Oral  SpO2: 100% 92% 97% 95%  Weight:   107.3 kg   Height:        Intake/Output Summary (Last 24 hours) at 09/27/2020 1004 Last data filed at 09/27/2020 0826 Gross per 24 hour  Intake 598 ml  Output 3525 ml  Net -2927 ml   Last 3 Weights 09/27/2020 09/26/2020 09/25/2020  Weight (lbs) 236 lb 9.6 oz 244 lb 3.2 oz 246 lb 9.6 oz  Weight (kg) 107.321 kg 110.768 kg 111.857 kg      Telemetry    Atrial Flutter  - Personally Reviewed  ECG    None since 09/23/20 - Personally Reviewed  Physical Exam   GEN: No acute distress.   Neck: No JVD at 90 degrees Cardiac: RRR, no murmurs, rubs, or gallops.  Respiratory: crackles in the bases GI: Soft, nontender, non-distended  MS: 1+ BLE edema; No deformity. Neuro:  Nonfocal  Psych: Normal affect   Labs    High Sensitivity Troponin:   Recent Labs  Lab 09/13/20 1213 09/13/20 1412 09/21/20 1527 09/21/20 1713  TROPONINIHS  27* 28* 21* 20*      Chemistry Recent Labs  Lab 09/21/20 1527 09/22/20 0406 09/25/20 0459 09/26/20 0338 09/27/20 0150  NA 138   < > 138 140 138  K 4.1   < > 3.5 3.8 3.7  CL 100   < > 93* 98 96*  CO2 34*   < > 40* 36* 36*  GLUCOSE 111*   < > 104* 98 125*  BUN 8   < > '11 10 14  '$ CREATININE 1.19   < > 1.23 1.09 1.25*  CALCIUM 8.4*   < > 8.8* 8.6* 8.6*  PROT 5.9*  --   --   --   --   ALBUMIN 2.9*  --   --   --   --   AST 21  --   --   --   --   ALT 18  --   --   --   --   ALKPHOS 70  --   --   --   --   BILITOT 0.4  --   --   --   --   GFRNONAA >60   < > >60 >60 >60  ANIONGAP 4*   < > '5 6 6   '$ < > =  values in this interval not displayed.     Hematology Recent Labs  Lab 09/21/20 1527  WBC 6.5  RBC 2.72*  HGB 7.6*  HCT 26.3*  MCV 96.7  MCH 27.9  MCHC 28.9*  RDW 16.6*  PLT 233    BNP Recent Labs  Lab 09/21/20 1527  BNP 893.0*     DDimer No results for input(s): DDIMER in the last 168 hours.   Radiology    No results found.  Cardiac Studies   Echo from 09/14/20:   1. Right ventricular systolic function is severely reduced. The right  ventricular size is severely enlarged. There is moderately elevated  pulmonary artery systolic pressure. The estimated right ventricular  systolic pressure is 123XX123 mmHg.   2. 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   3. Left ventricular ejection fraction, by estimation, is 45 to 50%. The  left ventricle has low normal function. The left ventricle demonstrates  global hypokinesis. There is moderate left ventricular hypertrophy. Left  ventricular diastolic parameters are   indeterminate. There is the interventricular septum is flattened in  systole and diastole, consistent with right ventricular pressure and  volume overload.   4. Left atrial size was severely dilated.   5. Right atrial size was severely dilated.   6. The mitral valve is grossly normal. Mild mitral valve regurgitation.   No evidence of mitral stenosis.   7. The aortic valve is tricuspid. There is mild calcification of the  aortic valve. Aortic valve regurgitation is trivial. No aortic stenosis is  present.   8. Aortic dilatation noted. There is mild dilatation of the ascending  aorta, measuring 40 mm.   9. The inferior vena cava is dilated in size with <50% respiratory  variability, suggesting right atrial pressure of 15 mmHg.  10. Cannot exclude small PFO.    Patient Profile   Dennis Swanson is a 65 y.o. 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   Assessment & Plan    Acute hypoxic respiratory failure Acute on chronic systolic and diastolic heart failure HTN - increase to lasix 100 IV BID - GDMT: carvedilol 6.25 mg twice daily, started spironolactone 25 mg daily,  Entresto 24/'26mg'$  BID, will add Farxiga '10mg'$ ;  pharmacy to assist cost barriers, will need repeat BMP in 1-2 weeks outpatient  - follow up with HF impact clinic on 09/30/20 and cardiology on 10/16/20 arranged  - consider outpatient sleep study and PFTs given severe RV enlargement   Persistent atrial fibrillation/ flutter - coreg and eliquis as above - will consider outpatient rhythm control strategy based on alcohol use once DC  Mild TAA - outpatient f/u  HLD - previously on Lipitor 40 mg daily, LDL is 44, OK remain off statin for now   Type 2 diabetes Iron deficiency anemia History of recent GI bleed 08/2020 - managed per IM        For questions or updates, please contact [facility] Please consult [email or web address] for contact info under        Signed, Werner Lean, MD  09/27/2020, 10:04 AM

## 2020-09-27 NOTE — Progress Notes (Signed)
PROGRESS NOTE    Dennis Swanson  [id number] DOB: [birth year] DOA: 09/21/2020 PCP: Pcp, No    Brief Narrative:  Dennis Swanson was admitted to the hospital working diagnosis of acute on chronic systolic heart failure exacerbation.  Severe tricuspid vegetation.   65 year old male past medical history for heart failure, type 2 diabetes mellitus, hypertension, dyslipidemia, obesity, pulmonary embolism, diverticulosis, iron deficiency anemia who presented with dyspnea.  Reported 3 months of worsening extremity edema, eventually developed dyspnea that prompted him to come to the hospital.  On his initial physical examination his oximetry was 88% on ambulation, his blood pressure 147/97, heart rate 81, respiratory rate 21, oxygen saturation 97%, positive JVD, lungs are clear to auscultation bilaterally, heart S1-S2, present, rhythmic, positive systolic murmur at the apex, 2+ pitting bilateral extremity edema.   Sodium 138, potassium 4.1, chloride 100, bicarb 34, glucose 111, BUN 8, creatinine 1.19, BNP 893, high sensitive troponin 21-20, white count 6.5, hemoglobin 7.6, hematocrit 26.3, platelets 233. SARS COVID-19 negative.   Chest radiograph with hilar vascular congestion.  Elevated right hemidiaphragm.   EKG 78 bpm, left axis deviation, first-degree AV block, sinus rhythm, poor R wave progression, no significant ST segment or T wave changes.   Patient was placed on aggressive diuresis with intravenous furosemide.   Assessment & Plan:   Principal Problem:   Acute CHF (congestive heart failure) (HCC) Active Problems:   Atrial fibrillation (HCC)   CKD (chronic kidney disease) stage 3, GFR 30-59 ml/min (HCC)   Acute hypoxemic respiratory failure (HCC)   Elevated troponin   Acute on chronic systolic heart failure exacerbation. Acute hypoxemic respiratory failure.  Continue to improve edema, urine output over last 24 hrs is 0000000 ml, systolic blood pressure this am down to 91 mmHg.   Since admission negative fluid balance -8,635 ml    On carvedilol, dapagliflozin, metoprolol, entresto and spironolactone.  Dose of furosemide has been increased to 100 mg bid    2. Atrial fibrillation On metoprolol for rate control and apixaban for anticoagulation   3. Hypokalemia. Metabolic alkalosis  Renal function with serum cr at 1,25 with K at 3,7 and serum bicarbonate at 36 Continue K correction with KCL, follow renal function in am, now on higher dose of furosemide.   4. Right great toe acute gout flare.  Had prednisone, continue allopurinol. No further pain   5. HTN, continue close blood pressure monitoring, continue  with metoprolol.    6. Iron deficiency anemia and hx of GI bleed. Hgb stable at 7,5, continue with iron supplementation. On pantoprazole    Patient continue to be at high risk for worsening heart failure   Status is: Inpatient  Remains inpatient appropriate because:IV treatments appropriate due to intensity of illness or inability to take PO  Dispo: The patient is from: Home              Anticipated d/c is to: Home              Patient currently is not medically stable to d/c.   Difficult to place patient No   DVT prophylaxis: Apixaban   Code Status:    full  Family Communication:   No family at the bedside      Consultants:  Cardiology    Subjective: Patient is feeling better. Edema of the lower extremities continue to improve but not yet back to baseline, no dyspnea or chest pain,   Objective: Vitals:   09/27/20 0005 09/27/20 0407 09/27/20 [id number] 09/27/20 1038  BP: 99/73 1'04/60 97/63 91/62 '$  Pulse: 80 85 89 76  Resp: '17 17 18 17  '$ Temp: 97.7 F (36.5 C) 98.3 F (36.8 C) 99.4 F (37.4 C) 98.6 F (37 C)  TempSrc: Oral Oral Oral Oral  SpO2: 92% 97% 95% 98%  Weight:  107.3 kg    Height:        Intake/Output Summary (Last 24 hours) at 09/27/2020 1041 Last data filed at 09/27/2020 0826 Gross per 24 hour  Intake 598 ml  Output 3525 ml   Net -2927 ml   Filed Weights   09/25/20 0414 09/26/20 0503 09/27/20 0407  Weight: 111.9 kg 110.8 kg 107.3 kg    Examination:   General: Not in pain or dyspnea. Deconditioned  Neurology: Awake and alert, non focal  E ENT: no pallor, no icterus, oral mucosa moist Cardiovascular: No JVD. S1-S2 present, rhythmic, no gallops, rubs, or murmurs. + lower extremity edema. Pulmonary: positive breath sounds bilaterally, adequate air movement, no wheezing, or rhonchi mild bilateral rales. Gastrointestinal. Abdomen  soft and non tender Skin. No rashes Musculoskeletal: no joint deformities     Data Reviewed: I have personally reviewed following labs and imaging studies  CBC: Recent Labs  Lab 09/21/20 1527  WBC 6.5  NEUTROABS 4.7  HGB 7.6*  HCT 26.3*  MCV 96.7  PLT 0000000   Basic Metabolic Panel: Recent Labs  Lab 09/23/20 0256 09/24/20 0232 09/25/20 0459 09/26/20 0338 09/27/20 0150  NA 140 140 138 140 138  K 3.7 4.1 3.5 3.8 3.7  CL 98 97* 93* 98 96*  CO2 38* 39* 40* 36* 36*  GLUCOSE 108* 99 104* 98 125*  BUN '12 12 11 10 14  '$ CREATININE 1.24 1.19 1.23 1.09 1.25*  CALCIUM 8.5* 8.9 8.8* 8.6* 8.6*  MG 1.8 1.9 1.9 2.0 2.2   GFR: Estimated Creatinine Clearance: 73.6 mL/min (A) (by C-G formula based on SCr of 1.25 mg/dL (H)). Liver Function Tests: Recent Labs  Lab 09/21/20 1527  AST 21  ALT 18  ALKPHOS 70  BILITOT 0.4  PROT 5.9*  ALBUMIN 2.9*   No results for input(s): LIPASE, AMYLASE in the last 168 hours. No results for input(s): AMMONIA in the last 168 hours. Coagulation Profile: No results for input(s): INR, PROTIME in the last 168 hours. Cardiac Enzymes: No results for input(s): CKTOTAL, CKMB, CKMBINDEX, TROPONINI in the last 168 hours. BNP (last 3 results) No results for input(s): PROBNP in the last 8760 hours. HbA1C: No results for input(s): HGBA1C in the last 72 hours. CBG: No results for input(s): GLUCAP in the last 168 hours. Lipid Profile: No results  for input(s): CHOL, HDL, LDLCALC, TRIG, CHOLHDL, LDLDIRECT in the last 72 hours. Thyroid Function Tests: No results for input(s): TSH, T4TOTAL, FREET4, T3FREE, THYROIDAB in the last 72 hours. Anemia Panel: No results for input(s): VITAMINB12, FOLATE, FERRITIN, TIBC, IRON, RETICCTPCT in the last 72 hours.    Radiology Studies: I have reviewed all of the imaging during this hospital visit personally     Scheduled Meds:  apixaban  5 mg Oral BID   carvedilol  6.25 mg Oral BID WC   dapagliflozin propanediol  10 mg Oral Daily   ferrous sulfate  325 mg Oral Q breakfast   pantoprazole  40 mg Oral Daily   potassium chloride  40 mEq Oral Daily   sacubitril-valsartan  1 tablet Oral BID   spironolactone  25 mg Oral Daily   Continuous Infusions:  furosemide       LOS:  6 days        Amberlie Gaillard Gerome Apley, MD

## 2020-09-28 DIAGNOSIS — I4821 Permanent atrial fibrillation: Secondary | ICD-10-CM

## 2020-09-28 LAB — BASIC METABOLIC PANEL
Anion gap: 8 (ref 5–15)
BUN: 16 mg/dL (ref 8–23)
CO2: 34 mmol/L — ABNORMAL HIGH (ref 22–32)
Calcium: 8.6 mg/dL — ABNORMAL LOW (ref 8.9–10.3)
Chloride: 98 mmol/L (ref 98–111)
Creatinine, Ser: 1.18 mg/dL (ref 0.61–1.24)
GFR, Estimated: >60 mL/min (ref >60)
Glucose, Bld: 128 mg/dL — ABNORMAL HIGH (ref 70–99)
Potassium: 3.8 mmol/L (ref 3.5–5.1)
Sodium: 140 mmol/L (ref 135–145)

## 2020-09-28 NOTE — Progress Notes (Addendum)
PROGRESS NOTE    Dennis Swanson  [id number] DOB: 20-Jun-1955 DOA: 09/21/2020 PCP: Pcp, No    Brief Narrative:  [name] was admitted to the hospital working diagnosis of acute on chronic systolic heart failure exacerbation.  Severe tricuspid regurgitation   65 year old male past medical history for heart failure, type 2 diabetes mellitus, hypertension, dyslipidemia, obesity, pulmonary embolism, diverticulosis, iron deficiency anemia who presented with dyspnea.  Reported 3 months of worsening extremity edema, eventually developed dyspnea that prompted him to come to the hospital.  On his initial physical examination his oximetry was 88% on ambulation, his blood pressure 147/97, heart rate 81, respiratory rate 21, oxygen saturation 97%, positive JVD, lungs are clear to auscultation bilaterally, heart S1-S2, present, rhythmic, positive systolic murmur at the apex, 2+ pitting bilateral extremity edema.   Sodium 138, potassium 4.1, chloride 100, bicarb 34, glucose 111, BUN 8, creatinine 1.19, BNP 893, high sensitive troponin 21-20, white count 6.5, hemoglobin 7.6, hematocrit 26.3, platelets 233. SARS COVID-19 negative.   Chest radiograph with hilar vascular congestion.  Elevated right hemidiaphragm.   EKG 78 bpm, left axis deviation, first-degree AV block, sinus rhythm, poor R wave progression, no significant ST segment or T wave changes.   Patient was placed on aggressive diuresis with intravenous furosemide with good toleration.     Assessment & Plan:   Principal Problem:   Acute CHF (congestive heart failure) (HCC) Active Problems:   Atrial fibrillation (HCC)   CKD (chronic kidney disease) stage 3, GFR 30-59 ml/min (HCC)   Acute hypoxemic respiratory failure (HCC)   Elevated troponin     Acute on chronic systolic predominant right heart failure exacerbation. Acute hypoxemic respiratory failure.  RV failure , pulmonary HTN.  Echocardiogram from 07/22 with severe reduction  in RV systolic function, RSVP [id number] mmHg.  Severe tricuspid regurgitation.  LV with global hypokinesis with EF 45 to 50%. Severe biatrial enlargement,   Improving lower extremity edema, continue to have scrotal edema, his urine output over last 24 hrs is 3,475 ml. Systolic blood pressure has been 86 to 105 mmHg. Warm lower extremities.    Continue diuresis with furosemide  Heart failure management with carvedilol, dapagliflozin, metoprolol and spironolactone.  Hold on entresot for now due to hypotension.   Possible transition to oral diuretics in the next 24 hrs.    2. Atrial fibrillation Continue rate control with metoprolol and apixaban for anticoagulation   3. Hypokalemia. Metabolic alkalosis  Serum cr today down to 1,18 with K at 3,8 and serum bicarbonate at 34, Continue with IV furosemide. Follow up renal function in am, avoid hypotension or nephrotoxic medications  Continue with Kcl supplementation    4. Right great toe acute gout flare.  SP prednisone,  Continue allopurinol.    5. HTN, blood pressure has been low, continue close monitoring.  Will hold on Entresto for now.    6. Iron deficiency anemia and hx of GI bleed. Hgb stable at 7,5, continue with iron supplementation. Continue with pantoprazole    Patient continue to be at high risk for worsening heart failure   Status is: Inpatient  Remains inpatient appropriate because:IV treatments appropriate due to intensity of illness or inability to take PO  Dispo: The patient is from: Home              Anticipated d/c is to: Home              Patient currently is not medically stable to d/c.   Difficult to  place patient No   DVT prophylaxis: Apixaban   Code Status:    full  Family Communication:   No family at the bedside      Consultants:  Cardiology   Subjective: Patient continue to feel better, but not yet back to baseline, continue to have scrotal edema, no dyspnea or chest pain,   Objective: Vitals:    09/27/20 2020 09/28/20 0416 09/28/20 0844 09/28/20 1009  BP: (!) '88/57 95/63 98/64 '$ 107/68  Pulse: 71 78 73 77  Resp: '17 17 18   '$ Temp: 97.6 F (36.4 C) 97.6 F (36.4 C) 97.6 F (36.4 C)   TempSrc: Oral Oral Oral   SpO2: 100% 92% 92%   Weight:  105.1 kg    Height:        Intake/Output Summary (Last 24 hours) at 09/28/2020 1156 Last data filed at 09/28/2020 [id number] Gross per 24 hour  Intake 717 ml  Output 1700 ml  Net -983 ml   Filed Weights   09/26/20 0503 09/27/20 0407 09/28/20 0416  Weight: 110.8 kg 107.3 kg 105.1 kg    Examination:   General: Not in pain or dyspnea, Neurology: Awake and alert, non focal  E ENT: no pallor, no icterus, oral mucosa moist Cardiovascular: No JVD. S1-S2 present, rhythmic, no gallops, rubs, or murmurs. Trace lower extremity edema.  Pulmonary: positive breath sounds bilaterally, adequate air movement, no wheezing, rhonchi or rales. Gastrointestinal. Abdomen soft . Positive scrotal edema.  Skin. No rashes Musculoskeletal: no joint deformities     Data Reviewed: I have personally reviewed following labs and imaging studies  CBC: Recent Labs  Lab 09/21/20 1527  WBC 6.5  NEUTROABS 4.7  HGB 7.6*  HCT 26.3*  MCV 96.7  PLT 0000000   Basic Metabolic Panel: Recent Labs  Lab 09/23/20 0256 09/24/20 0232 09/25/20 0459 09/26/20 0338 09/27/20 0150 09/28/20 0506  NA 140 140 138 140 138 140  K 3.7 4.1 3.5 3.8 3.7 3.8  CL 98 97* 93* 98 96* 98  CO2 38* 39* 40* 36* 36* 34*  GLUCOSE 108* 99 104* 98 125* 128*  BUN '12 12 11 10 14 16  '$ CREATININE 1.24 1.19 1.23 1.09 1.25* 1.18  CALCIUM 8.5* 8.9 8.8* 8.6* 8.6* 8.6*  MG 1.8 1.9 1.9 2.0 2.2  --    GFR: Estimated Creatinine Clearance: 77.2 mL/min (by C-G formula based on SCr of 1.18 mg/dL). Liver Function Tests: Recent Labs  Lab 09/21/20 1527  AST 21  ALT 18  ALKPHOS 70  BILITOT 0.4  PROT 5.9*  ALBUMIN 2.9*   No results for input(s): LIPASE, AMYLASE in the last 168 hours. No results for  input(s): AMMONIA in the last 168 hours. Coagulation Profile: No results for input(s): INR, PROTIME in the last 168 hours. Cardiac Enzymes: No results for input(s): CKTOTAL, CKMB, CKMBINDEX, TROPONINI in the last 168 hours. BNP (last 3 results) No results for input(s): PROBNP in the last 8760 hours. HbA1C: No results for input(s): HGBA1C in the last 72 hours. CBG: No results for input(s): GLUCAP in the last 168 hours. Lipid Profile: No results for input(s): CHOL, HDL, LDLCALC, TRIG, CHOLHDL, LDLDIRECT in the last 72 hours. Thyroid Function Tests: No results for input(s): TSH, T4TOTAL, FREET4, T3FREE, THYROIDAB in the last 72 hours. Anemia Panel: No results for input(s): VITAMINB12, FOLATE, FERRITIN, TIBC, IRON, RETICCTPCT in the last 72 hours.    Radiology Studies: I have reviewed all of the imaging during this hospital visit personally     Scheduled Meds:  apixaban  5 mg Oral BID   carvedilol  6.25 mg Oral BID WC   dapagliflozin propanediol  10 mg Oral Daily   ferrous sulfate  325 mg Oral Q breakfast   pantoprazole  40 mg Oral Daily   potassium chloride  40 mEq Oral Daily   sacubitril-valsartan  1 tablet Oral BID   spironolactone  25 mg Oral Daily   Continuous Infusions:  furosemide 100 mg (09/28/20 0858)     LOS: 7 days        Abiola Behring Gerome Apley, MD

## 2020-09-28 NOTE — Progress Notes (Signed)
Progress Note  Patient Name: Dennis Swanson Date of Encounter: 09/28/2020  [facility] Cardiologist: Prior Dr. Harl Bowie  Subjective   Feeling much better. Legs less swollen, breathing easier. Making good urine.  Inpatient Medications    Scheduled Meds:  apixaban  5 mg Oral BID   carvedilol  6.25 mg Oral BID WC   dapagliflozin propanediol  10 mg Oral Daily   ferrous sulfate  325 mg Oral Q breakfast   pantoprazole  40 mg Oral Daily   potassium chloride  40 mEq Oral Daily   sacubitril-valsartan  1 tablet Oral BID   spironolactone  25 mg Oral Daily   Continuous Infusions:  furosemide 100 mg (09/28/20 0858)   PRN Meds: acetaminophen **OR** acetaminophen, dextromethorphan-guaiFENesin, hydrALAZINE, metoprolol tartrate, senna-docusate   Vital Signs    Vitals:   09/27/20 2020 09/28/20 0416 09/28/20 0844 09/28/20 1009  BP: (!) '88/57 95/63 98/64 '$ 107/68  Pulse: 71 78 73 77  Resp: '17 17 18   '$ Temp: 97.6 F (36.4 C) 97.6 F (36.4 C) 97.6 F (36.4 C)   TempSrc: Oral Oral Oral   SpO2: 100% 92% 92%   Weight:  105.1 kg    Height:        Intake/Output Summary (Last 24 hours) at 09/28/2020 1123 Last data filed at 09/28/2020 [id number] Gross per 24 hour  Intake 717 ml  Output 1700 ml  Net -983 ml   Last 3 Weights 09/28/2020 09/27/2020 09/26/2020  Weight (lbs) 231 lb 12.8 oz 236 lb 9.6 oz 244 lb 3.2 oz  Weight (kg) 105.144 kg 107.321 kg 110.768 kg      Telemetry    Atrial flutter, rate controlled, with intermittent PVCs. 14 beat run of NSVT at 00:01 this morning - Personally Reviewed  ECG    No new - Personally Reviewed  Physical Exam   GEN: No acute distress.   Neck: No JVD appreciated but difficult body habitus Cardiac: RRR, no murmurs, rubs, or gallops.  Respiratory: Clear in upper fields, fine crackles at the bases GI: Soft, nontender, non-distended  MS: trivial to 1+ bilateral ankle edema; No deformity. Neuro:  Nonfocal  Psych: Normal affect   Labs    High  Sensitivity Troponin:   Recent Labs  Lab 09/13/20 1213 09/13/20 1412 09/21/20 1527 09/21/20 1713  TROPONINIHS 27* 28* 21* 20*      Chemistry Recent Labs  Lab 09/21/20 1527 09/22/20 0406 09/26/20 0338 09/27/20 0150 09/28/20 0506  NA 138   < > 140 138 140  K 4.1   < > 3.8 3.7 3.8  CL 100   < > 98 96* 98  CO2 34*   < > 36* 36* 34*  GLUCOSE 111*   < > 98 125* 128*  BUN 8   < > '10 14 16  '$ CREATININE 1.19   < > 1.09 1.25* 1.18  CALCIUM 8.4*   < > 8.6* 8.6* 8.6*  PROT 5.9*  --   --   --   --   ALBUMIN 2.9*  --   --   --   --   AST 21  --   --   --   --   ALT 18  --   --   --   --   ALKPHOS 70  --   --   --   --   BILITOT 0.4  --   --   --   --   GFRNONAA >60   < > >60 >60 >  60  ANIONGAP 4*   < > '6 6 8   '$ < > = values in this interval not displayed.     Hematology Recent Labs  Lab 09/21/20 1527  WBC 6.5  RBC 2.72*  HGB 7.6*  HCT 26.3*  MCV 96.7  MCH 27.9  MCHC 28.9*  RDW 16.6*  PLT 233    BNP Recent Labs  Lab 09/21/20 1527  BNP 893.0*     DDimer No results for input(s): DDIMER in the last 168 hours.   Radiology    No results found.  Cardiac Studies   Echo 09/14/20 1. Right ventricular systolic function is severely reduced. The right  ventricular size is severely enlarged. There is moderately elevated  pulmonary artery systolic pressure. The estimated right ventricular  systolic pressure is [id number] mmHg.   2. 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   3. Left ventricular ejection fraction, by estimation, is 45 to 50%. The  left ventricle has low normal function. The left ventricle demonstrates  global hypokinesis. There is moderate left ventricular hypertrophy. Left  ventricular diastolic parameters are   indeterminate. There is the interventricular septum is flattened in  systole and diastole, consistent with right ventricular pressure and  volume overload.   4. Left atrial size was severely dilated.   5. Right  atrial size was severely dilated.   6. The mitral valve is grossly normal. Mild mitral valve regurgitation.  No evidence of mitral stenosis.   7. The aortic valve is tricuspid. There is mild calcification of the  aortic valve. Aortic valve regurgitation is trivial. No aortic stenosis is  present.   8. Aortic dilatation noted. There is mild dilatation of the ascending  aorta, measuring 40 mm.   9. The inferior vena cava is dilated in size with <50% respiratory  variability, suggesting right atrial pressure of 15 mmHg.  10. Cannot exclude small PFO.   Patient Profile     65 y.o. male with PMH chronic combined systolic and diastolic heart failure, permanent atrial fibrillation/atrial flutter, hypertension, hyperlipidemia, type II diabetes whom we are following in consultation for acute on chronic combined systolic and diastolic heart failure.  Assessment & Plan    Acute on chronic combined systolic and diastolic heart failure history of diabetes previously, A1c 5 years ago 9.6, recent 5.5 Hypertension -admission weight 123.8 kg, current weight 105.1 kg -reported net negative 9.6 L -currently on 100 mg IV lasix twice daily -continue carvedilol 6.25 mg twice daily, spironolactone 25 mg daily, entresto 24/26 mg twice daily, dapagliflozin 10 mg daily. -has outpatient cardiology follow up scheduled -Cr stable at 1.18 -K 3.8, on daily supplementations plus entresto/spironolactone, monitor. Would drop oral K once taking oral diuretic -he is nearing euvolemia, suspect we can change to oral in the near future based on exam/labs  History of hyperlipidemia: -on no agents, last LDL 44  Permanent atrial fibrillation/atrial flutter -CHA2DS2/VAS Stroke Risk Points= 4 (HF, HTN, diabetes, age) -continue carvedilol, apixaban -no prior plans for rhythm control based on notes. Given heart failure, could consider, though may be difficult to keep him in sinus given duration.  For questions or updates,  please contact [place] Please consult [email or web address] for contact info under     Signed, Buford Dresser, MD  09/28/2020, 11:23 AM

## 2020-09-28 NOTE — Plan of Care (Signed)

## 2020-09-28 NOTE — Plan of Care (Signed)
Soft BP overnight which is consistent with the patient's history. Patient is ambulatory in room and denies CP, dizziness, or SOB. Potential discharge to home tomorrow.   Problem: Education: Goal: Knowledge of General Education information will improve Description: Including pain rating scale, medication(s)/side effects and non-pharmacologic comfort measures Outcome: Progressing   Problem: Health Behavior/Discharge Planning: Goal: Ability to manage health-related needs will improve Outcome: Progressing   Problem: Clinical Measurements: Goal: Ability to maintain clinical measurements within normal limits will improve Outcome: Progressing Goal: Will remain free from infection Outcome: Progressing Goal: Diagnostic test results will improve Outcome: Progressing Goal: Respiratory complications will improve Outcome: Progressing Goal: Cardiovascular complication will be avoided Outcome: Progressing   Problem: Activity: Goal: Risk for activity intolerance will decrease Outcome: Progressing   Problem: Nutrition: Goal: Adequate nutrition will be maintained Outcome: Progressing   Problem: Coping: Goal: Level of anxiety will decrease Outcome: Progressing   Problem: Elimination: Goal: Will not experience complications related to bowel motility Outcome: Progressing Goal: Will not experience complications related to urinary retention Outcome: Progressing   Problem: Pain Managment: Goal: General experience of comfort will improve Outcome: Progressing   Problem: Safety: Goal: Ability to remain free from injury will improve Outcome: Progressing   Problem: Skin Integrity: Goal: Risk for impaired skin integrity will decrease Outcome: Progressing   Problem: Education: Goal: Ability to demonstrate management of disease process will improve Outcome: Progressing Goal: Ability to verbalize understanding of medication therapies will improve Outcome: Progressing Goal: Individualized  Educational Video(s) Outcome: Progressing   Problem: Activity: Goal: Capacity to carry out activities will improve Outcome: Progressing   Problem: Cardiac: Goal: Ability to achieve and maintain adequate cardiopulmonary perfusion will improve Outcome: Progressing

## 2020-09-29 LAB — BASIC METABOLIC PANEL
Anion gap: 6 (ref 5–15)
BUN: 17 mg/dL (ref 8–23)
CO2: 36 mmol/L — ABNORMAL HIGH (ref 22–32)
Calcium: 8.7 mg/dL — ABNORMAL LOW (ref 8.9–10.3)
Chloride: 97 mmol/L — ABNORMAL LOW (ref 98–111)
Creatinine, Ser: 1.13 mg/dL (ref 0.61–1.24)
GFR, Estimated: >60 mL/min (ref >60)
Glucose, Bld: 97 mg/dL (ref 70–99)
Potassium: 4 mmol/L (ref 3.5–5.1)
Sodium: 139 mmol/L (ref 135–145)

## 2020-09-29 LAB — MAGNESIUM: Magnesium: 2.3 mg/dL (ref 1.7–2.4)

## 2020-09-29 MED ORDER — SPIRONOLACTONE 25 MG PO TABS: 25.0000 mg | ORAL_TABLET | Freq: Every day | ORAL | 0 refills | Status: DC

## 2020-09-29 MED ORDER — FUROSEMIDE 40 MG PO TABS
40.0000 mg | ORAL_TABLET | Freq: Two times a day (BID) | ORAL | 0 refills | Status: DC
Start: 2020-09-30 — End: 2020-12-03

## 2020-09-29 MED ORDER — FUROSEMIDE 40 MG PO TABS: 40.0000 mg | ORAL_TABLET | Freq: Two times a day (BID) | ORAL | Status: DC

## 2020-09-29 MED ORDER — DAPAGLIFLOZIN PROPANEDIOL 10 MG PO TABS: 10.0000 mg | ORAL_TABLET | Freq: Every day | ORAL | 0 refills | Status: AC

## 2020-09-29 MED ORDER — CARVEDILOL 6.25 MG PO TABS: 6.2500 mg | ORAL_TABLET | Freq: Two times a day (BID) | ORAL | 0 refills | Status: DC

## 2020-09-29 NOTE — Discharge Summary (Signed)
Physician Discharge Summary  Dennis Swanson [id number] DOB: 11-11-1955 DOA: 09/21/2020  PCP: Pcp, No  Admit date: 09/21/2020 Discharge date: 09/29/2020  Admitted From: Home  Disposition:   Home   Recommendations for Outpatient Follow-up and new medication changes:  Follow up with Primary care in 7 to 10 days.  Continue diuresis with furosemide, added spironolactone Added dapaglifloxin and increased carvedilol   Home Health: no   Equipment/Devices: na    Discharge Condition: stable  CODE STATUS: full  Diet recommendation:  heart healthy   Brief/Interim Summary: [name] was admitted to the hospital with the working diagnosis of acute on chronic systolic heart failure exacerbation.  Severe tricuspid regurgitation and RV failure.    65 year old male past medical history for heart failure, type 2 diabetes mellitus, hypertension, dyslipidemia, obesity, pulmonary embolism, diverticulosis, iron deficiency anemia who presented with dyspnea.  Reported 3 months of worsening extremity edema, eventually developed dyspnea that prompted him to come to the hospital.  On his initial physical examination his oximetry was 88% on ambulation, his blood pressure 147/97, heart rate 81, respiratory rate 21, oxygen saturation 97% on supplemental 02, positive JVD, lungs were clear to auscultation bilaterally, heart S1-S2, present, rhythmic, positive systolic murmur at the apex, 2+ pitting bilateral extremity edema.   Sodium 138, potassium 4.1, chloride 100, bicarb 34, glucose 111, BUN 8, creatinine 1.19, BNP 893, high sensitive troponin 21-20, white count 6.5, hemoglobin 7.6, hematocrit 26.3, platelets 233. SARS COVID-19 negative.   Chest radiograph with hilar vascular congestion.  Elevated right hemidiaphragm.   EKG 78 bpm, left axis deviation, first-degree AV block, sinus rhythm, poor R wave progression, no significant ST segment or T wave changes.   Patient was placed on aggressive diuresis with  intravenous furosemide with good toleration.   Acute on chronic systolic/diastolic heart failure exacerbation, predominantly right heart failure.  Acute hypoxic respiratory failure, pulmonary hypertension. Patient was admitted to the telemetry ward, he was diuresed aggressively with intravenous furosemide. Negative fluid balance was achieved, -13,109 mL since admission with significant improvement of his symptoms.  His echocardiography from July 22 show severe reduction in RV systolic function, RSVP 123XX123 mmHg.  Severe tricuspid regurgitation.  Left ventricle with global hypokinesis, ejection fraction 45 to 50% with severe biatrial enlargement.  2.  Permanent atrial fibrillation.  Continue rate control metoprolol, anticoagulation with apixaban.  3.  Hypokalemia, metabolic alkalosis.  Patient was diuresed with furosemide with good toleration. Electrolytes were corrected.  His discharge sodium 139, potassium 4.0, chloride 97, bicarb 36, glucose 97, BUN 17, creatinine 1.18, magnesium 2.3. Follow-up kidney function as an outpatient.  4.  Right great toe acute gout flare.  Patient received a short course of prednisone, continue allopurinol.  5.  Hypertension.  Patient developed hypotension with aggressive diuresis. Unable to continue Entresto. Follow-up blood pressure as an outpatient.  6.  Iron deficiency anemia, history of GI bleed.  No acute bleeding, continue pantoprazole, his discharge hemoglobin is 7.6. Follow-up hemoglobin as an outpatient.  Continue oral iron supplementation.  Discharge Diagnoses:  Principal Problem:   Acute CHF (congestive heart failure) (HCC) Active Problems:   Atrial fibrillation (HCC)   CKD (chronic kidney disease) stage 3, GFR 30-59 ml/min (HCC)   Acute hypoxemic respiratory failure (HCC)   Elevated troponin    Discharge Instructions   Allergies as of 09/29/2020   No Known Allergies      Medication List     STOP taking these medications     ferrous sulfate 325 (65 FE)  MG tablet       TAKE these medications    apixaban 5 MG Tabs tablet Commonly known as: ELIQUIS Take 1 tablet (5 mg total) by mouth 2 (two) times daily.   carvedilol 6.25 MG tablet Commonly known as: COREG Take 1 tablet (6.25 mg total) by mouth 2 (two) times daily with a meal. What changed:  medication strength how much to take   dapagliflozin propanediol 10 MG Tabs tablet Commonly known as: FARXIGA Take 1 tablet (10 mg total) by mouth daily. Start taking on: September 30, 2020   furosemide 40 MG tablet Commonly known as: LASIX Take 1 tablet (40 mg total) by mouth 2 (two) times daily. Start taking on: September 30, 2020   hydrocortisone 25 MG suppository Commonly known as: ANUSOL-HC Place 1 suppository (25 mg total) rectally 2 (two) times daily.   pantoprazole 40 MG tablet Commonly known as: PROTONIX Take 1 tablet (40 mg total) by mouth daily.   spironolactone 25 MG tablet Commonly known as: ALDACTONE Take 1 tablet (25 mg total) by mouth daily. Start taking on: September 30, 2020        Follow-up Information     Er[facility], [place] Follow up on 10/16/2020.   Specialties: Physician Assistant, Cardiology Why: at 2:30 PM for your post hospital follow up appointment with cardiology Contact information: [place] [address] [phone]                No Known Allergies  Consultations: Cardiology    Procedures/Studies: DG Chest 2 View  Result Date: 09/21/2020 CLINICAL DATA:  Dyspnea EXAM: CHEST - 2 VIEW COMPARISON:  09/13/2020 FINDINGS: Lungs are clear. Mild eventration of the right hemidiaphragm is unchanged. No pneumothorax or pleural effusion. Mild cardiomegaly is stable. Pulmonary vascularity is normal. No acute bone abnormality. Degenerative changes are seen within the right shoulder and thoracic spine. IMPRESSION: No active cardiopulmonary disease.  Stable cardiomegaly. Electronically Signed   By: Fidela Salisbury MD   On: 09/21/2020 15:55   DG Chest 2 View  Result Date: 09/13/2020 CLINICAL DATA:  Leg swelling. EXAM: CHEST - 2 VIEW COMPARISON:  11/09/2016 FINDINGS: Cardiac enlargement. No pleural effusion or edema. No airspace opacities identified. Thoracic spondylosis noted. IMPRESSION: 1. Cardiac enlargement. 2. No heart failure. Electronically Signed   By: Kerby Moors M.D.   On: 09/13/2020 12:38   CT CHEST W CONTRAST  Result Date: 09/16/2020 CLINICAL DATA:  New anal mass. Evaluate for metastatic disease. Lower extremity edema for 1 month. EXAM: CT CHEST, ABDOMEN, AND PELVIS WITH CONTRAST TECHNIQUE: Multidetector CT imaging of the chest, abdomen and pelvis was performed following the standard protocol during bolus administration of intravenous contrast. CONTRAST:  113m OMNIPAQUE IOHEXOL 300 MG/ML  SOLN COMPARISON:  Chest CTA 08/21/2009. Endoscopy and colonoscopy reports from yesterday. FINDINGS: CT CHEST FINDINGS Cardiovascular: Atherosclerosis of the aorta, great vessels and coronary arteries. There is central enlargement of the pulmonary arteries consistent with pulmonary arterial hypertension. A small linear filling defect in the right lower lobe pulmonary artery (image 33/2) may reflect a web related to prior pulmonary embolism. No signs of recurrent acute pulmonary embolism. The heart is moderately enlarged. No significant pericardial effusion. Mediastinum/Nodes: There are no enlarged mediastinal, hilar or axillary lymph nodes. The thyroid gland, trachea and esophagus demonstrate no significant findings. Lungs/Pleura: No pleural effusion or pneumothorax. Mild emphysematous changes and mild dependent atelectasis at both lung bases. No suspicious pulmonary nodularity. Musculoskeletal/Chest wall: No chest wall mass or suspicious osseous  findings. Progressive advanced right glenohumeral arthropathy. Moderate bilateral gynecomastia. CT ABDOMEN AND PELVIS FINDINGS Hepatobiliary: On the initial images  through the liver, the portal vein is not yet opacified. Delayed phase images include the entire liver and demonstrate several low-density well-circumscribed lesions in the right lobe which are likely cysts, largest in the dome measuring 14 mm on image 5/3. There is an ill-defined low-density lesion in the left lobe measuring 0.9 cm on image 21/3 which is indeterminate. The gallbladder is incompletely distended. No evidence of gallstones, gallbladder wall thickening or biliary dilatation. Pancreas: Unremarkable. No pancreatic ductal dilatation or surrounding inflammatory changes. Spleen: Normal in size without focal abnormality. Adrenals/Urinary Tract: Both adrenal glands appear normal. The kidneys appear normal, without evidence of urinary tract calculus, mass lesion or hydronephrosis. There is symmetric perinephric soft tissue stranding bilaterally. A significant portion of the bladder extends into a right inguinal hernia. The bladder is decompressed. Stomach/Bowel: Enteric contrast was administered and has passed into the proximal colon. The stomach appears unremarkable for its degree of distension. No evidence of bowel wall thickening, distention or surrounding inflammatory change. The distal colon is decompressed. No obvious anorectal mass. The anus is incompletely visualized. Vascular/Lymphatic: There are no enlarged abdominal or pelvic lymph nodes. Small retroperitoneal lymph nodes are not pathologically enlarged. There are small partially calcified lymph nodes in the porta hepatis. There is aortic and branch vessel atherosclerosis without acute vascular findings. Reproductive: Mild enlargement of the prostate gland. Other: There is a large right inguinal hernia which is incompletely visualized. There is fluid and a portion of the bladder within this hernia sac. No herniated bowel. There is a smaller left inguinal hernia containing only fat. There is a moderate amount of ascites. There is generalized edema  throughout the intra-abdominal and subcutaneous fat. No focal extraluminal fluid or air collections are seen. Musculoskeletal: No acute or significant osseous findings. Multiple old right-sided transverse process fractures are seen within the lumbar spine. There is multilevel spondylosis. IMPRESSION: 1. No obvious anorectal mass. The anus is incompletely visualized. Correlate clinically. 2. No findings highly suspicious for metastatic disease. There is an indeterminate 9 mm low-density lesion in left hepatic lobe. 3. Ascites with generalized soft tissue edema. 4. Moderate-sized right inguinal hernia containing fluid and a portion of the urinary bladder. 5. Coronary and Aortic Atherosclerosis (ICD10-I70.0). Cardiomegaly with central enlargement of the pulmonary arteries consistent with pulmonary arterial hypertension. 6. Old lumbar spine transverse process fractures, spondylosis and right glenohumeral arthropathy. Electronically Signed   By: Richardean Sale M.D.   On: 09/16/2020 15:03   CT ABDOMEN PELVIS W CONTRAST  Result Date: 09/16/2020 CLINICAL DATA:  New anal mass. Evaluate for metastatic disease. Lower extremity edema for 1 month. EXAM: CT CHEST, ABDOMEN, AND PELVIS WITH CONTRAST TECHNIQUE: Multidetector CT imaging of the chest, abdomen and pelvis was performed following the standard protocol during bolus administration of intravenous contrast. CONTRAST:  154m OMNIPAQUE IOHEXOL 300 MG/ML  SOLN COMPARISON:  Chest CTA 08/21/2009. Endoscopy and colonoscopy reports from yesterday. FINDINGS: CT CHEST FINDINGS Cardiovascular: Atherosclerosis of the aorta, great vessels and coronary arteries. There is central enlargement of the pulmonary arteries consistent with pulmonary arterial hypertension. A small linear filling defect in the right lower lobe pulmonary artery (image 33/2) may reflect a web related to prior pulmonary embolism. No signs of recurrent acute pulmonary embolism. The heart is moderately enlarged. No  significant pericardial effusion. Mediastinum/Nodes: There are no enlarged mediastinal, hilar or axillary lymph nodes. The thyroid gland, trachea and esophagus demonstrate no  significant findings. Lungs/Pleura: No pleural effusion or pneumothorax. Mild emphysematous changes and mild dependent atelectasis at both lung bases. No suspicious pulmonary nodularity. Musculoskeletal/Chest wall: No chest wall mass or suspicious osseous findings. Progressive advanced right glenohumeral arthropathy. Moderate bilateral gynecomastia. CT ABDOMEN AND PELVIS FINDINGS Hepatobiliary: On the initial images through the liver, the portal vein is not yet opacified. Delayed phase images include the entire liver and demonstrate several low-density well-circumscribed lesions in the right lobe which are likely cysts, largest in the dome measuring 14 mm on image 5/3. There is an ill-defined low-density lesion in the left lobe measuring 0.9 cm on image 21/3 which is indeterminate. The gallbladder is incompletely distended. No evidence of gallstones, gallbladder wall thickening or biliary dilatation. Pancreas: Unremarkable. No pancreatic ductal dilatation or surrounding inflammatory changes. Spleen: Normal in size without focal abnormality. Adrenals/Urinary Tract: Both adrenal glands appear normal. The kidneys appear normal, without evidence of urinary tract calculus, mass lesion or hydronephrosis. There is symmetric perinephric soft tissue stranding bilaterally. A significant portion of the bladder extends into a right inguinal hernia. The bladder is decompressed. Stomach/Bowel: Enteric contrast was administered and has passed into the proximal colon. The stomach appears unremarkable for its degree of distension. No evidence of bowel wall thickening, distention or surrounding inflammatory change. The distal colon is decompressed. No obvious anorectal mass. The anus is incompletely visualized. Vascular/Lymphatic: There are no enlarged abdominal  or pelvic lymph nodes. Small retroperitoneal lymph nodes are not pathologically enlarged. There are small partially calcified lymph nodes in the porta hepatis. There is aortic and branch vessel atherosclerosis without acute vascular findings. Reproductive: Mild enlargement of the prostate gland. Other: There is a large right inguinal hernia which is incompletely visualized. There is fluid and a portion of the bladder within this hernia sac. No herniated bowel. There is a smaller left inguinal hernia containing only fat. There is a moderate amount of ascites. There is generalized edema throughout the intra-abdominal and subcutaneous fat. No focal extraluminal fluid or air collections are seen. Musculoskeletal: No acute or significant osseous findings. Multiple old right-sided transverse process fractures are seen within the lumbar spine. There is multilevel spondylosis. IMPRESSION: 1. No obvious anorectal mass. The anus is incompletely visualized. Correlate clinically. 2. No findings highly suspicious for metastatic disease. There is an indeterminate 9 mm low-density lesion in left hepatic lobe. 3. Ascites with generalized soft tissue edema. 4. Moderate-sized right inguinal hernia containing fluid and a portion of the urinary bladder. 5. Coronary and Aortic Atherosclerosis (ICD10-I70.0). Cardiomegaly with central enlargement of the pulmonary arteries consistent with pulmonary arterial hypertension. 6. Old lumbar spine transverse process fractures, spondylosis and right glenohumeral arthropathy. Electronically Signed   By: Richardean Sale M.D.   On: 09/16/2020 15:03   US Venous Img Lower Bilateral (DVT)  Result Date: 09/14/2020 CLINICAL DATA:  Bilateral lower extremity swelling for 1 month EXAM: BILATERAL LOWER EXTREMITY VENOUS DOPPLER ULTRASOUND TECHNIQUE: Gray-scale sonography with compression, as well as color and duplex ultrasound, were performed to evaluate the deep venous system(s) from the level of the  common femoral vein through the popliteal and proximal calf veins. COMPARISON:  None. FINDINGS: VENOUS Normal compressibility of the common femoral, superficial femoral, and popliteal veins, as well as the visualized calf veins. Visualized portions of profunda femoral vein and great saphenous vein unremarkable. No filling defects to suggest DVT on grayscale or color Doppler imaging. Doppler waveforms show normal direction of venous flow, normal respiratory plasticity and response to augmentation. OTHER None. Limitations: none IMPRESSION: No  lower extremity DVT Electronically Signed   By: Miachel Roux M.D.   On: 09/14/2020 11:09   ECHOCARDIOGRAM COMPLETE  Result Date: 09/14/2020    ECHOCARDIOGRAM REPORT   Patient Name:   Dennis Swanson Date of Exam: 09/14/2020 Medical Rec #:  [id number]           Height:       72.0 in Accession #:    [id number]          Weight:       260.0 lb Date of Birth:  February 23, 1955            BSA:          2.382 m Patient Age:    78 years            BP:           115/78 mmHg Patient Gender: M                   HR:           59 bpm. Exam Location:  [facility] Procedure: 2D Echo, Cardiac Doppler and Color Doppler Indications:    I26.02 Pulmonary embolus  History:        Patient has prior history of Echocardiogram examinations, most                 recent 05/06/2015. CHF, Arrythmias:Atrial Flutter; Risk                 Factors:Hypertension. Alcohol abuse.  Sonographer:    Jonelle Sidle Dance Referring Phys: [email or web address].Ahr DAVID TAT IMPRESSIONS  1. Right ventricular systolic function is severely reduced. The right ventricular size is severely enlarged. There is moderately elevated pulmonary artery systolic pressure. The estimated right ventricular systolic pressure is 123XX123 mmHg.  2. 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  3. Left ventricular ejection fraction, by estimation, is 45 to 50%. The left ventricle has low normal function. The left ventricle demonstrates global  hypokinesis. There is moderate left ventricular hypertrophy. Left ventricular diastolic parameters are  indeterminate. There is the interventricular septum is flattened in systole and diastole, consistent with right ventricular pressure and volume overload.  4. Left atrial size was severely dilated.  5. Right atrial size was severely dilated.  6. The mitral valve is grossly normal. Mild mitral valve regurgitation. No evidence of mitral stenosis.  7. The aortic valve is tricuspid. There is mild calcification of the aortic valve. Aortic valve regurgitation is trivial. No aortic stenosis is present.  8. Aortic dilatation noted. There is mild dilatation of the ascending aorta, measuring 40 mm.  9. The inferior vena cava is dilated in size with <50% respiratory variability, suggesting right atrial pressure of 15 mmHg. 10. Cannot exclude small PFO. FINDINGS  Left Ventricle: Left ventricular ejection fraction, by estimation, is 45 to 50%. The left ventricle has low normal function. The left ventricle demonstrates global hypokinesis. The left ventricular internal cavity size was normal in size. There is moderate left ventricular hypertrophy. The interventricular septum is flattened in systole and diastole, consistent with right ventricular pressure and volume overload. Left ventricular diastolic parameters are indeterminate. Right Ventricle: The right ventricular size is severely enlarged. No increase in right ventricular wall thickness. Right ventricular systolic function is severely reduced. There is moderately elevated pulmonary artery systolic pressure. The tricuspid regurgitant velocity is 3.22 m/s, and with an assumed right atrial pressure of 15 mmHg, the estimated right ventricular systolic pressure is 56.5  mmHg. Left Atrium: Left atrial size was severely dilated. Right Atrium: Right atrial size was severely dilated. Pericardium: There is no evidence of pericardial effusion. Mitral Valve: The mitral valve is grossly  normal. Mild mitral valve regurgitation. No evidence of mitral valve stenosis. Tricuspid Valve: The tricuspid valve is normal in structure. Tricuspid valve regurgitation is severe. No evidence of tricuspid stenosis. Aortic Valve: The aortic valve is tricuspid. There is mild calcification of the aortic valve. Aortic valve regurgitation is trivial. No aortic stenosis is present. Pulmonic Valve: The pulmonic valve was grossly normal. Pulmonic valve regurgitation is mild. No evidence of pulmonic stenosis. Aorta: Aortic dilatation noted. There is mild dilatation of the ascending aorta, measuring 40 mm. Venous: The inferior vena cava is dilated in size with less than 50% respiratory variability, suggesting right atrial pressure of 15 mmHg. IAS/Shunts: Cannot exclude small PFO.  LEFT VENTRICLE PLAX 2D LVIDd:         5.69 cm  Diastology LVIDs:         4.43 cm  LV e' medial:    7.53 cm/s LV PW:         1.43 cm  LV E/e' medial:  9.5 LV IVS:        1.39 cm  LV e' lateral:   15.70 cm/s LVOT diam:     2.10 cm  LV E/e' lateral: 4.5 LV SV:         50 LV SV Index:   21 LVOT Area:     3.46 cm  RIGHT VENTRICLE RV Basal diam:  5.20 cm RV Mid diam:    3.53 cm RV S prime:     8.32 cm/s TAPSE (M-mode): 1.9 cm LEFT ATRIUM              Index       RIGHT ATRIUM           Index LA diam:        6.30 cm  2.64 cm/m  RA Area:     55.20 cm LA Vol (A2C):   176.0 ml 73.89 ml/m RA Volume:   268.00 ml 112.52 ml/m LA Vol (A4C):   125.0 ml 52.48 ml/m LA Biplane Vol: 148.0 ml 62.14 ml/m  AORTIC VALVE LVOT Vmax:   80.60 cm/s LVOT Vmean:  57.400 cm/s LVOT VTI:    0.145 m  AORTA Ao Root diam: 3.80 cm Ao Asc diam:  4.00 cm MITRAL VALVE               TRICUSPID VALVE MV Area (PHT): 2.29 cm    TR Peak grad:   41.5 mmHg MV Decel Time: 331 msec    TR Vmax:        322.00 cm/s MV E velocity: 71.20 cm/s MV A velocity: 31.60 cm/s  SHUNTS MV E/A ratio:  2.25        Systemic VTI:  0.14 m                            Systemic Diam: 2.10 cm Cherlynn Kaiser MD  Electronically signed by Cherlynn Kaiser MD Signature Date/Time: 09/14/2020/5:35:04 PM    Final        Subjective: Patient is feeling better, dyspnea and edema continue to improve, no chest pain.,   Discharge Exam: Vitals:   09/29/20 0423 09/29/20 0800  BP: 114/66 (P) 95/63  Pulse: 81 88  Resp: 17 18  Temp: 98.3 F (36.8 C) 98.3 F (36.8  C)  SpO2: 93% 98%   Vitals:   09/28/20 1634 09/28/20 2019 09/29/20 0423 09/29/20 0800  BP: (!) 95/58 (!) 81/50 114/66 (P) 95/63  Pulse: 77 77 81 88  Resp:  '18 17 18  '$ Temp: 98.6 F (37 C) 98.6 F (37 C) 98.3 F (36.8 C) 98.3 F (36.8 C)  TempSrc: Oral Oral Oral Oral  SpO2: 98% 100% 93% 98%  Weight:   102.7 kg   Height:        General: Not in pain or dyspnea.  Neurology: Awake and alert, non focal  E ENT: no pallor, no icterus, oral mucosa moist Cardiovascular: No JVD. S1-S2 present, rhythmic, no gallops, rubs, or murmurs. No lower extremity edema. Mild scrotal edema.  Pulmonary: positive breath sounds bilaterally, with no wheezing, rhonchi or rales. Gastrointestinal. Abdomen soft and non tender Skin. No rashes Musculoskeletal: no joint deformities   The results of significant diagnostics from this hospitalization (including imaging, microbiology, ancillary and laboratory) are listed below for reference.     Microbiology: Recent Results (from the past 240 hour(s))  Resp Panel by RT-PCR (Flu A&B, Covid) Nasopharyngeal Swab     Status: None   Collection Time: 09/21/20 10:40 PM   Specimen: Nasopharyngeal Swab; Nasopharyngeal(NP) swabs in vial transport medium  Result Value Ref Range Status   SARS Coronavirus 2 by RT PCR NEGATIVE NEGATIVE Final    Comment: (NOTE) SARS-CoV-2 target nucleic acids are NOT DETECTED.  The SARS-CoV-2 RNA is generally detectable in upper respiratory specimens during the acute phase of infection. The lowest concentration of SARS-CoV-2 viral copies this assay can detect is 138 copies/mL. A negative result  does not preclude SARS-Cov-2 infection and should not be used as the sole basis for treatment or other patient management decisions. A negative result may occur with  improper specimen collection/handling, submission of specimen other than nasopharyngeal swab, presence of viral mutation(s) within the areas targeted by this assay, and inadequate number of viral copies(<138 copies/mL). A negative result must be combined with clinical observations, patient history, and epidemiological information. The expected result is Negative.  Fact Sheet for Patients:  EntrepreneurPulse.com.au  Fact Sheet for Healthcare Providers:  IncredibleEmployment.be  This test is no t yet approved or cleared by the [place] FDA and  has been authorized for detection and/or diagnosis of SARS-CoV-2 by FDA under an Emergency Use Authorization (EUA). This EUA will remain  in effect (meaning this test can be used) for the duration of the COVID-19 declaration under Section 564(b)(1) of the Act, 21 U.S.C.section 360bbb-3(b)(1), unless the authorization is terminated  or revoked sooner.       Influenza A by PCR NEGATIVE NEGATIVE Final   Influenza B by PCR NEGATIVE NEGATIVE Final    Comment: (NOTE) The Xpert Xpress SARS-CoV-2/FLU/RSV plus assay is intended as an aid in the diagnosis of influenza from Nasopharyngeal swab specimens and should not be used as a sole basis for treatment. Nasal washings and aspirates are unacceptable for Xpert Xpress SARS-CoV-2/FLU/RSV testing.  Fact Sheet for Patients: EntrepreneurPulse.com.au  Fact Sheet for Healthcare Providers: IncredibleEmployment.be  This test is not yet approved or cleared by the [place] FDA and has been authorized for detection and/or diagnosis of SARS-CoV-2 by FDA under an Emergency Use Authorization (EUA). This EUA will remain in effect (meaning this test can be used) for  the duration of the COVID-19 declaration under Section 564(b)(1) of the Act, 21 U.S.C. section 360bbb-3(b)(1), unless the authorization is terminated or revoked.  Performed at [facility]  Hospital Lab, [place] [address] [place], [place] [address]      Labs: BNP (last 3 results) Recent Labs    09/21/20 1527  BNP [id number]   Basic Metabolic Panel: Recent Labs  Lab 09/24/20 0232 09/25/20 0459 09/26/20 0338 09/27/20 0150 09/28/20 0506 09/29/20 0404  NA 140 138 140 138 140 139  K 4.1 3.5 3.8 3.7 3.8 4.0  CL 97* 93* 98 96* 98 97*  CO2 39* 40* 36* 36* 34* 36*  GLUCOSE 99 104* 98 125* 128* 97  BUN '12 11 10 14 16 17  '$ CREATININE 1.19 1.23 1.09 1.25* 1.18 1.13  CALCIUM 8.9 8.8* 8.6* 8.6* 8.6* 8.7*  MG 1.9 1.9 2.0 2.2  --  2.3   Liver Function Tests: No results for input(s): AST, ALT, ALKPHOS, BILITOT, PROT, ALBUMIN in the last 168 hours. No results for input(s): LIPASE, AMYLASE in the last 168 hours. No results for input(s): AMMONIA in the last 168 hours. CBC: No results for input(s): WBC, NEUTROABS, HGB, HCT, MCV, PLT in the last 168 hours. Cardiac Enzymes: No results for input(s): CKTOTAL, CKMB, CKMBINDEX, TROPONINI in the last 168 hours. BNP: Invalid input(s): POCBNP CBG: No results for input(s): GLUCAP in the last 168 hours. D-Dimer No results for input(s): DDIMER in the last 72 hours. Hgb A1c No results for input(s): HGBA1C in the last 72 hours. Lipid Profile No results for input(s): CHOL, HDL, LDLCALC, TRIG, CHOLHDL, LDLDIRECT in the last 72 hours. Thyroid function studies No results for input(s): TSH, T4TOTAL, T3FREE, THYROIDAB in the last 72 hours.  Invalid input(s): FREET3 Anemia work up No results for input(s): VITAMINB12, FOLATE, FERRITIN, TIBC, IRON, RETICCTPCT in the last 72 hours. Urinalysis    Component Value Date/Time   COLORURINE YELLOW 09/13/2020 1603   APPEARANCEUR CLEAR 09/13/2020 1603   LABSPEC 1.024 09/13/2020 1603   PHURINE 6.0 09/13/2020 1603    GLUCOSEU NEGATIVE 09/13/2020 1603   HGBUR NEGATIVE 09/13/2020 1603   BILIRUBINUR NEGATIVE 09/13/2020 1603   KETONESUR NEGATIVE 09/13/2020 1603   PROTEINUR >=300 (A) 09/13/2020 1603   UROBILINOGEN 0.2 08/20/2009 1446   NITRITE NEGATIVE 09/13/2020 1603   LEUKOCYTESUR NEGATIVE 09/13/2020 1603   Sepsis Labs Invalid input(s): PROCALCITONIN,  WBC,  LACTICIDVEN Microbiology Recent Results (from the past 240 hour(s))  Resp Panel by RT-PCR (Flu A&B, Covid) Nasopharyngeal Swab     Status: None   Collection Time: 09/21/20 10:40 PM   Specimen: Nasopharyngeal Swab; Nasopharyngeal(NP) swabs in vial transport medium  Result Value Ref Range Status   SARS Coronavirus 2 by RT PCR NEGATIVE NEGATIVE Final    Comment: (NOTE) SARS-CoV-2 target nucleic acids are NOT DETECTED.  The SARS-CoV-2 RNA is generally detectable in upper respiratory specimens during the acute phase of infection. The lowest concentration of SARS-CoV-2 viral copies this assay can detect is 138 copies/mL. A negative result does not preclude SARS-Cov-2 infection and should not be used as the sole basis for treatment or other patient management decisions. A negative result may occur with  improper specimen collection/handling, submission of specimen other than nasopharyngeal swab, presence of viral mutation(s) within the areas targeted by this assay, and inadequate number of viral copies(<138 copies/mL). A negative result must be combined with clinical observations, patient history, and epidemiological information. The expected result is Negative.  Fact Sheet for Patients:  EntrepreneurPulse.com.au  Fact Sheet for Healthcare Providers:  IncredibleEmployment.be  This test is no t yet approved or cleared by the [place] FDA and  has been authorized for detection and/or diagnosis  of SARS-CoV-2 by FDA under an Emergency Use Authorization (EUA). This EUA will remain  in effect (meaning this  test can be used) for the duration of the COVID-19 declaration under Section 564(b)(1) of the Act, 21 U.S.C.section 360bbb-3(b)(1), unless the authorization is terminated  or revoked sooner.       Influenza A by PCR NEGATIVE NEGATIVE Final   Influenza B by PCR NEGATIVE NEGATIVE Final    Comment: (NOTE) The Xpert Xpress SARS-CoV-2/FLU/RSV plus assay is intended as an aid in the diagnosis of influenza from Nasopharyngeal swab specimens and should not be used as a sole basis for treatment. Nasal washings and aspirates are unacceptable for Xpert Xpress SARS-CoV-2/FLU/RSV testing.  Fact Sheet for Patients: EntrepreneurPulse.com.au  Fact Sheet for Healthcare Providers: IncredibleEmployment.be  This test is not yet approved or cleared by the [place] FDA and has been authorized for detection and/or diagnosis of SARS-CoV-2 by FDA under an Emergency Use Authorization (EUA). This EUA will remain in effect (meaning this test can be used) for the duration of the COVID-19 declaration under Section 564(b)(1) of the Act, 21 U.S.C. section 360bbb-3(b)(1), unless the authorization is terminated or revoked.  Performed at [facility], [place] [address], [place], [place] [address]      Time coordinating discharge: 45 minutes  SIGNED:   Tawni Millers, MD  [facility] 09/29/2020, 8:35 AM

## 2020-09-29 NOTE — Plan of Care (Signed)

## 2020-09-29 NOTE — Progress Notes (Addendum)
Progress Note  Patient Name: Dennis Swanson Date of Encounter: 09/29/2020  [facility] Cardiologist: Prior Dr. Harl Bowie  Subjective   Blood pressure running low, entresto discontinued yesterday evening. He feels well, does not note any symptoms with low blood pressure. Swelling much improved.  Inpatient Medications    Scheduled Meds:  apixaban  5 mg Oral BID   carvedilol  6.25 mg Oral BID WC   dapagliflozin propanediol  10 mg Oral Daily   ferrous sulfate  325 mg Oral Q breakfast   [START ON 09/30/2020] furosemide  40 mg Oral BID   pantoprazole  40 mg Oral Daily   spironolactone  25 mg Oral Daily   Continuous Infusions:   PRN Meds: acetaminophen **OR** acetaminophen, dextromethorphan-guaiFENesin, hydrALAZINE, metoprolol tartrate, senna-docusate   Vital Signs    Vitals:   09/28/20 1634 09/28/20 2019 09/29/20 0423 09/29/20 0800  BP: (!) 95/58 (!) 81/50 114/66 (P) 95/63  Pulse: 77 77 81 88  Resp:  '18 17 18  '$ Temp: 98.6 F (37 C) 98.6 F (37 C) 98.3 F (36.8 C) 98.3 F (36.8 C)  TempSrc: Oral Oral Oral Oral  SpO2: 98% 100% 93% 98%  Weight:   102.7 kg   Height:        Intake/Output Summary (Last 24 hours) at 09/29/2020 1029 Last data filed at 09/29/2020 1003 Gross per 24 hour  Intake 1534 ml  Output 4275 ml  Net -2741 ml   Last 3 Weights 09/29/2020 09/28/2020 09/27/2020  Weight (lbs) 226 lb 6.4 oz 231 lb 12.8 oz 236 lb 9.6 oz  Weight (kg) 102.694 kg 105.144 kg 107.321 kg      Telemetry    Atrial flutter, rate controlled, with intermittent PVCs.- Personally Reviewed  ECG    No new - Personally Reviewed  Physical Exam   GEN: No acute distress.   Neck: No JVD appreciated but difficult body habitus Cardiac: RRR, no murmurs, rubs, or gallops.  Respiratory: Clear in upper fields, fine crackles at the bases GI: Soft, nontender, non-distended  MS: trivial to 1+ bilateral ankle edema; No deformity. Neuro:  Nonfocal  Psych: Normal affect   Labs    High  Sensitivity Troponin:   Recent Labs  Lab 09/13/20 1213 09/13/20 1412 09/21/20 1527 09/21/20 1713  TROPONINIHS 27* 28* 21* 20*      Chemistry Recent Labs  Lab 09/27/20 0150 09/28/20 0506 09/29/20 0404  NA 138 140 139  K 3.7 3.8 4.0  CL 96* 98 97*  CO2 36* 34* 36*  GLUCOSE 125* 128* 97  BUN '14 16 17  '$ CREATININE 1.25* 1.18 1.13  CALCIUM 8.6* 8.6* 8.7*  GFRNONAA >60 >60 >60  ANIONGAP '6 8 6     '$ Hematology No results for input(s): WBC, RBC, HGB, HCT, MCV, MCH, MCHC, RDW, PLT in the last 168 hours.   BNP No results for input(s): BNP, PROBNP in the last 168 hours.    DDimer No results for input(s): DDIMER in the last 168 hours.   Radiology    No results found.  Cardiac Studies   Echo 09/14/20 1. Right ventricular systolic function is severely reduced. The right  ventricular size is severely enlarged. There is moderately elevated  pulmonary artery systolic pressure. The estimated right ventricular  systolic pressure is 123XX123 mmHg.   2. 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   3. Left ventricular ejection fraction, by estimation, is 45 to 50%. The  left ventricle has low normal function. The  left ventricle demonstrates  global hypokinesis. There is moderate left ventricular hypertrophy. Left  ventricular diastolic parameters are   indeterminate. There is the interventricular septum is flattened in  systole and diastole, consistent with right ventricular pressure and  volume overload.   4. Left atrial size was severely dilated.   5. Right atrial size was severely dilated.   6. The mitral valve is grossly normal. Mild mitral valve regurgitation.  No evidence of mitral stenosis.   7. The aortic valve is tricuspid. There is mild calcification of the  aortic valve. Aortic valve regurgitation is trivial. No aortic stenosis is  present.   8. Aortic dilatation noted. There is mild dilatation of the ascending  aorta, measuring 40 mm.    9. The inferior vena cava is dilated in size with <50% respiratory  variability, suggesting right atrial pressure of 15 mmHg.  10. Cannot exclude small PFO.   Patient Profile     65 y.o. male with PMH chronic combined systolic and diastolic heart failure, permanent atrial fibrillation/atrial flutter, hypertension, hyperlipidemia, type II diabetes whom we are following in consultation for acute on chronic combined systolic and diastolic heart failure.  Assessment & Plan    Acute on chronic combined systolic and diastolic heart failure history of diabetes previously, A1c 5 years ago 9.6, recent 5.5 Hypertension -admission weight 123.8 kg, current weight 102.7 kg -reported net negative 9.6 L -currently on 100 mg IV lasix twice daily. Will change to 40 mg twice daily as an outpatient, may need to consider torsemide if fluid builds up again -continue carvedilol 6.25 mg twice daily, spironolactone 25 mg daily, dapagliflozin 10 mg daily. -entresto stopped due to hypotension, if BP improved on follow up would restart. -has outpatient cardiology follow up scheduled -Cr stable at 1.13 -K 4.0 -he is nearing euvolemia, ok to change to oral  History of hyperlipidemia: -on no agents, last LDL 44  Permanent atrial fibrillation/atrial flutter -CHA2DS2/VAS Stroke Risk Points= 4 (HF, HTN, diabetes, age) -continue carvedilol, apixaban -no prior plans for rhythm control based on notes. Given heart failure, could consider, though may be difficult to keep him in sinus given duration.  [facility] will sign off.   Medication Recommendations:  as currently ordered: Apixaban 5 mg BID Carvedilol 6.25 mg BID Dapagliflozin 10 mg daily Furosemide 40 mg BID Spironolactone 25 mg daily Other recommendations (labs, testing, etc):  none Follow up as an outpatient: he has follow up with [facility] in [place] on 10/16/20  For questions or updates, please contact [place] Please consult  [email or web address] for contact info under     Signed, Buford Dresser, MD  09/29/2020, 10:29 AM

## 2020-09-30 ENCOUNTER — Encounter (HOSPITAL_COMMUNITY): Payer: Medicaid Other
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
## 2020-10-02 ENCOUNTER — Telehealth (HOSPITAL_COMMUNITY): Payer: Self-pay

## 2020-10-02 ENCOUNTER — Other Ambulatory Visit: Payer: Self-pay

## 2020-10-02 DIAGNOSIS — K6289 Other specified diseases of anus and rectum: Secondary | ICD-10-CM

## 2020-10-02 NOTE — Telephone Encounter (Signed)
Called to confirm Heart & Vascular Transitions of Care appointment at 8/18 @ 12PM. Completed [facility]. Patient reminded to bring all medications and pill box organizer with them. Confirmed patient has transportation-daughter. Gave directions, instructed to utilize [place] parking.  Confirmed appointment prior to ending call.   Pricilla Holm, RN, BSN Heart Failure Nurse Navigator [phone]

## 2020-10-03 ENCOUNTER — Other Ambulatory Visit: Payer: Self-pay

## 2020-10-03 ENCOUNTER — Encounter (HOSPITAL_COMMUNITY): Payer: Self-pay

## 2020-10-03 ENCOUNTER — Ambulatory Visit (HOSPITAL_COMMUNITY)
Admit: 2020-10-03 | Discharge: 2020-10-03 | Disposition: A | Discharge status: Home / Self Care | Payer: Medicare Other | Attending: Internal Medicine | Admitting: Internal Medicine

## 2020-10-03 VITALS — BP 140/82 | HR 88 | Wt 232.0 lb

## 2020-10-03 DIAGNOSIS — E669 Obesity, unspecified: Secondary | ICD-10-CM | POA: Diagnosis not present

## 2020-10-03 DIAGNOSIS — Z8249 Family history of ischemic heart disease and other diseases of the circulatory system: Secondary | ICD-10-CM | POA: Insufficient documentation

## 2020-10-03 DIAGNOSIS — Z7984 Long term (current) use of oral hypoglycemic drugs: Secondary | ICD-10-CM | POA: Diagnosis not present

## 2020-10-03 DIAGNOSIS — I272 Pulmonary hypertension, unspecified: Secondary | ICD-10-CM | POA: Diagnosis not present

## 2020-10-03 DIAGNOSIS — I4821 Permanent atrial fibrillation: Secondary | ICD-10-CM | POA: Insufficient documentation

## 2020-10-03 DIAGNOSIS — N1831 Chronic kidney disease, stage 3a: Secondary | ICD-10-CM | POA: Diagnosis not present

## 2020-10-03 DIAGNOSIS — Z86711 Personal history of pulmonary embolism: Secondary | ICD-10-CM | POA: Diagnosis not present

## 2020-10-03 DIAGNOSIS — Z7901 Long term (current) use of anticoagulants: Secondary | ICD-10-CM | POA: Insufficient documentation

## 2020-10-03 DIAGNOSIS — I130 Hypertensive heart and chronic kidney disease with heart failure and stage 1 through stage 4 chronic kidney disease, or unspecified chronic kidney disease: Secondary | ICD-10-CM | POA: Diagnosis not present

## 2020-10-03 DIAGNOSIS — I5042 Chronic combined systolic (congestive) and diastolic (congestive) heart failure: Secondary | ICD-10-CM | POA: Insufficient documentation

## 2020-10-03 DIAGNOSIS — Z79899 Other long term (current) drug therapy: Secondary | ICD-10-CM | POA: Insufficient documentation

## 2020-10-03 DIAGNOSIS — F1020 Alcohol dependence, uncomplicated: Secondary | ICD-10-CM | POA: Insufficient documentation

## 2020-10-03 DIAGNOSIS — F1021 Alcohol dependence, in remission: Secondary | ICD-10-CM

## 2020-10-03 DIAGNOSIS — E1122 Type 2 diabetes mellitus with diabetic chronic kidney disease: Secondary | ICD-10-CM | POA: Diagnosis not present

## 2020-10-03 DIAGNOSIS — N182 Chronic kidney disease, stage 2 (mild): Secondary | ICD-10-CM

## 2020-10-03 DIAGNOSIS — I4892 Unspecified atrial flutter: Secondary | ICD-10-CM | POA: Insufficient documentation

## 2020-10-03 LAB — BASIC METABOLIC PANEL
Anion gap: 4 — ABNORMAL LOW (ref 5–15)
BUN: 9 mg/dL (ref 8–23)
CO2: 35 mmol/L — ABNORMAL HIGH (ref 22–32)
Calcium: 8.7 mg/dL — ABNORMAL LOW (ref 8.9–10.3)
Chloride: 99 mmol/L (ref 98–111)
Creatinine, Ser: 0.99 mg/dL (ref 0.61–1.24)
GFR, Estimated: >60 mL/min (ref >60)
Glucose, Bld: 92 mg/dL (ref 70–99)
Potassium: 4.4 mmol/L (ref 3.5–5.1)
Sodium: 138 mmol/L (ref 135–145)

## 2020-10-03 MED ORDER — ENTRESTO 24-26 MG PO TABS: 1.0000 | ORAL_TABLET | Freq: Two times a day (BID) | ORAL | 11 refills | Status: DC

## 2020-10-03 NOTE — Patient Instructions (Addendum)
Labs done today. We will contact you only if your labs are abnormal.  RESTART Entresto 24-'26mg'$  (1 tablet) by mouth 2 times daily.   No other medication changes were made. Please continue all current medications as prescribed.  Your physician recommends that you keep your pending appointment on August 31st at 2:30pm

## 2020-10-16 ENCOUNTER — Ambulatory Visit (INDEPENDENT_AMBULATORY_CARE_PROVIDER_SITE_OTHER): Payer: Medicare Other | Admitting: Student

## 2020-10-16 ENCOUNTER — Encounter: Payer: Self-pay | Admitting: Student

## 2020-10-16 VITALS — BP 130/80 | HR 76 | Ht 72.0 in | Wt 238.2 lb

## 2020-10-16 DIAGNOSIS — I480 Paroxysmal atrial fibrillation: Secondary | ICD-10-CM | POA: Diagnosis not present

## 2020-10-16 DIAGNOSIS — Z9189 Other specified personal risk factors, not elsewhere classified: Secondary | ICD-10-CM

## 2020-10-16 DIAGNOSIS — I519 Heart disease, unspecified: Secondary | ICD-10-CM

## 2020-10-16 DIAGNOSIS — D500 Iron deficiency anemia secondary to blood loss (chronic): Secondary | ICD-10-CM

## 2020-10-16 DIAGNOSIS — I5042 Chronic combined systolic (congestive) and diastolic (congestive) heart failure: Secondary | ICD-10-CM

## 2020-10-16 NOTE — Progress Notes (Signed)
Cardiology Office Note    Date:  10/16/2020   ID:  Dennis Swanson, Dennis Swanson Aug 26, 1955, MRN [id number]  PCP:  Pcp, No  Cardiologist: Carlyle Dolly, MD    Chief Complaint  Patient presents with   Hospitalization Follow-up    History of Present Illness:    Dennis Swanson is a 65 y.o. male with past medical history of paroxysmal atrial fibrillation, HFmrEF (EF 40-45% in 2011, at 60-65% in 04/2015 and at 45-50% by echo in 09/2020), mitral regurgitation, HTN, recent GIB (occurring in 08/2020), HLD and history of alcohol abuse who presents to the office today for hospital follow-up.  He was most recently admitted to [facility] on 09/21/2020 for evaluation of worsening dyspnea and lower extremity edema. Was found to have an acute CHF exacerbation and repeat echocardiogram showed his EF was at 45 to 50% but he was noted to have severely reduced RV function and his RV was severely enlarged. Was also noted to have severe tricuspid regurgitation, severe biatrial dilation, mild MR and elevated PASP. He was started on Coreg, Spironolactone, Entresto and Farxiga and transitioned from IV Lasix to PO Lasix '40mg'$  BID at discharge.   [facility]2020 and reported overall much improvement in his activity and dyspnea since hospital discharge. He denied any recurrent alcohol consumption following hospital discharge. He had not been taking Entresto since hospital discharge and it was recommended to start Entresto 24-26 mg twice daily and it was recommended that he have a sleep study and PFT's in the future.   In talking with the patient today, he reports overall feeling well. His breathing has significantly improved and he denies any orthopnea, PND or pitting edema. No recent chest pain or palpitations. His weight has increased by approximately 10 lbs on his home scales since discharge but he feels like this might be secondary to increased food consumption as  he has not noticed recurrent edema. He does not add salt to his meals but does consume TV dinners on occasion.    Past Medical History:  Diagnosis Date   Acute pulmonary embolism ([place]) 02/16/2009   Alcohol abuse    Atrial flutter (HCC)    CHF (congestive heart failure) ([place])    a. EF 40-45% in 2011 b. EF at 60-65% in 04/2015 c. EF at 45-50% by echo in 09/2020   Hypertension    Lower extremity cellulitis    bilateral   Systolic dysfunction     Past Surgical History:  Procedure Laterality Date   COLONOSCOPY N/A 05/17/2012   Procedure: COLONOSCOPY;  Surgeon: Danie Binder, MD;  [facility];  Service: Endoscopy;  Laterality: N/A;  10:30   COLONOSCOPY WITH PROPOFOL N/A 09/15/2020   Procedure: COLONOSCOPY WITH PROPOFOL;  Surgeon: Eloise Harman, DO;  Location[facility];  Service: Endoscopy;  Laterality: N/A;   ESOPHAGOGASTRODUODENOSCOPY (EGD) WITH PROPOFOL N/A 09/15/2020   Procedure: ESOPHAGOGASTRODUODENOSCOPY (EGD) WITH PROPOFOL;  Surgeon: Eloise Harman, DO;  [facility];  Service: Endoscopy;  Laterality: N/A;   None      Current Medications: Outpatient Medications Prior to Visit  Medication Sig Dispense Refill   apixaban (ELIQUIS) 5 MG TABS tablet Take 1 tablet (5 mg total) by mouth 2 (two) times daily. 60 tablet 1   carvedilol (COREG) 6.25 MG tablet Take 1 tablet (6.25 mg total) by mouth 2 (two) times daily with a meal. 60 tablet 0   dapagliflozin propanediol (  FARXIGA) 10 MG TABS tablet Take 1 tablet (10 mg total) by mouth daily. 30 tablet 0   ferrous sulfate 325 (65 FE) MG tablet Take 325 mg by mouth daily with breakfast.     furosemide (LASIX) 40 MG tablet Take 1 tablet (40 mg total) by mouth 2 (two) times daily. 60 tablet 0   hydrocortisone (ANUSOL-HC) 25 MG suppository Place 1 suppository (25 mg total) rectally 2 (two) times daily. 20 suppository 0   pantoprazole (PROTONIX) 40 MG tablet Take 1 tablet (40 mg total) by mouth daily. 30 tablet 1    sacubitril-valsartan (ENTRESTO) 24-26 MG Take 1 tablet by mouth 2 (two) times daily. 60 tablet 11   spironolactone (ALDACTONE) 25 MG tablet Take 1 tablet (25 mg total) by mouth daily. 30 tablet 0   No facility-administered medications prior to visit.     Allergies:   Patient has no known allergies.   Social History   Socioeconomic History   Marital status: Single    Spouse name: Not on file   Number of children: Not on file   Years of education: Not on file   Highest education level: Not on file  Occupational History   Occupation: Unemployed, previuosly yard Barista nd car detailing  Tobacco Use   Smoking status: Never   Smokeless tobacco: Never  Vaping Use   Vaping Use: Never used  Substance and Sexual Activity   Alcohol use: Not Currently    Alcohol/week: 3.0 standard drinks    Types: 3 Cans of beer per week    Comment: once a week   Drug use: No   Sexual activity: Not Currently  Other Topics Concern   Not on file  Social History Narrative   Lives alone, 2 daughters   No regular exercise   Social Determinants of Health   Financial Resource Strain: Low Risk    Difficulty of Paying Living Expenses: Not very hard  Food Insecurity: No Food Insecurity   Worried About Charity fundraiser in the Last Year: Never true   [place] in the Last Year: Never true  Transportation Needs: No Transportation Needs   Lack of Transportation (Medical): No   Lack of Transportation (Non-Medical): No  Physical Activity: Inactive   Days of Exercise per Week: 0 days   Minutes of Exercise per Session: 0 min  Stress: Not on file  Social Connections: Not on file     Family History:  The patient's family history includes Cancer in his father; Diabetes in his brother; Heart attack in his mother; Hypertension in his brother.   Review of Systems:    Please see the history of present illness.     All other systems reviewed and are otherwise negative except as noted above.   Physical  Exam:    VS:  BP 130/80   Pulse 76   Ht 6' (1.829 m)   Wt 238 lb 3.2 oz (108 kg)   SpO2 94%   BMI 32.31 kg/m    General: Well developed, well nourished,male appearing in no acute distress. Head: Normocephalic, atraumatic. Neck: No carotid bruits. JVD not elevated.  Lungs: Respirations regular and unlabored, without wheezes or rales.  Heart: Regular rate and rhythm. No S3 or S4.  No murmur, no rubs, or gallops appreciated. Abdomen: Appears non-distended. No obvious abdominal masses. Msk:  Strength and tone appear normal for age. No obvious joint deformities or effusions. Extremities: No clubbing or cyanosis. Trace ankle edema bilaterally.  Distal pedal  pulses are 2+ bilaterally. Neuro: Alert and oriented X 3. Moves all extremities spontaneously. No focal deficits noted. Psych:  Responds to questions appropriately with a normal affect. Skin: No rashes or lesions noted  Wt Readings from Last 3 Encounters:  10/16/20 238 lb 3.2 oz (108 kg)  10/03/20 232 lb (105.2 kg)  09/29/20 226 lb 6.4 oz (102.7 kg)     Studies/Labs Reviewed:   EKG:  EKG is not ordered today.    Recent Labs: 09/21/2020: ALT 18; B Natriuretic Peptide 893.0; Hemoglobin 7.6; Platelets 233 09/29/2020: Magnesium 2.3 10/03/2020: BUN 9; Creatinine, Ser 0.99; Potassium 4.4; Sodium 138   Lipid Panel    Component Value Date/Time   CHOL 91 09/24/2020 0232   TRIG 61 09/24/2020 0232   HDL 35 (L) 09/24/2020 0232   CHOLHDL 2.6 09/24/2020 0232   VLDL 12 09/24/2020 0232   LDLCALC 44 09/24/2020 0232    Additional studies/ records that were reviewed today include:   Echocardiogram: 08/2020 IMPRESSIONS     1. Right ventricular systolic function is severely reduced. The right  ventricular size is severely enlarged. There is moderately elevated  pulmonary artery systolic pressure. The estimated right ventricular  systolic pressure is 123XX123 mmHg.   2. Tricuspid valve regurgitation is severe and secondary to annular   dilation.   3. Left ventricular ejection fraction, by estimation, is 45 to 50%. The  left ventricle has low normal function. The left ventricle demonstrates  global hypokinesis. There is moderate left ventricular hypertrophy. Left  ventricular diastolic parameters are   indeterminate. There is the interventricular septum is flattened in  systole and diastole, consistent with right ventricular pressure and  volume overload.   4. Left atrial size was severely dilated.   5. Right atrial size was severely dilated.   6. The mitral valve is grossly normal. Mild mitral valve regurgitation.  No evidence of mitral stenosis.   7. The aortic valve is tricuspid. There is mild calcification of the  aortic valve. Aortic valve regurgitation is trivial. No aortic stenosis is  present.   8. Aortic dilatation noted. There is mild dilatation of the ascending  aorta, measuring 40 mm.   9. The inferior vena cava is dilated in size with <50% respiratory  variability, suggesting right atrial pressure of 15 mmHg.  10. Cannot exclude small PFO.   Assessment:    1. Systolic and diastolic CHF, chronic ([place])   2. PAF (paroxysmal atrial fibrillation) ([place])   3. Right ventricular dysfunction   4. At risk for sleep apnea   5. Iron deficiency anemia due to chronic blood loss      Plan:   In order of problems listed above:  1. HFrEF - His EF was at 45-50% by most recent echocardiogram and his respiratory status has overall been stable since hospital discharge. His weight has increased by approximately 10 lbs since hospital discharge but he reports increased food consumption and does not appear volume overloaded by examination today.  - Will continue his current regimen of Coreg 6.'25mg'$  BID, Farxiga '10mg'$  daily, Lasix '40mg'$  BID, Spironolactone '25mg'$  daily and Entresto 24-'26mg'$  BID. His creatinine was stable at 0.99 by recent labs on 8/18. We reviewed the importance of limiting his sodium intake.   2. Paroxysmal  Atrial Fibrillation/Flutter - He denies any recent palpitations and his HR is well-controlled in the 70's today. Continue Coreg 6.'25mg'$  BID for rate-control and Eliquis '5mg'$  BID for anticoagulation.   3. RV Dysfunction/Pulmonary HTN - Recent echocardiogram showed severely reduced  RV function and his RV was severely enlarged. Was also noted to have severe tricuspid regurgitation and elevated PASP.  - We reviewed today and will plan for referral to Pulmonology for a sleep study. Would likely benefit from PFT's as well.   4. Anemia - Was hospitalized in 08/2020 for a GIB and Hgb was at 7.3 with EGD showing gastritis and colonoscopy showing hemorrhoids and diverticulosis. He denies any evidence of recurrent bleeding. He does have scheduled follow-up with GI in 2 weeks.    Medication Adjustments/Labs and Tests Ordered: Current medicines are reviewed at length with the patient today.  Concerns regarding medicines are outlined above.  Medication changes, Labs and Tests ordered today are listed in the Patient Instructions below. Patient Instructions  Medication Instructions:  Your physician recommends that you continue on your current medications as directed. Please refer to the Current Medication list given to you today.  *If you need a refill on your cardiac medications before your next appointment, please call your pharmacy*   Lab Work: NONE   If you have labs (blood work) drawn today and your tests are completely normal, you will receive your results only by: [place] (if you have MyChart) OR A paper copy in the mail If you have any lab test that is abnormal or we need to change your treatment, we will call you to review the results.   Testing/Procedures: NONE    Follow-Up: At [facility], you and your health needs are our priority.  As part of our continuing mission to provide you with exceptional heart care, we have created designated Provider Care Teams.  These Care Teams  include your primary Cardiologist (physician) and Advanced Practice Providers (APPs -  Physician Assistants and Nurse Practitioners) who all work together to provide you with the care you need, when you need it.  We recommend signing up for the patient portal called "MyChart".  Sign up information is provided on this After Visit Summary.  MyChart is used to connect with patients for Virtual Visits (Telemedicine).  Patients are able to view lab/test results, encounter notes, upcoming appointments, etc.  Non-urgent messages can be sent to your provider as well.   To learn more about what you can do with MyChart, go to [email or web address].    Your next appointment:   3 month(s)  The format for your next appointment:   In Person  Provider:   Carlyle Dolly, MD   Other Instructions Thank you for choosing [facility]!      Signed, Erma Heritage, PA-C  10/16/2020 7:06 PM    [facility] [address] [place], [place] [address] Phone: [phone] Fax: [phone]

## 2020-10-16 NOTE — Patient Instructions (Signed)
Medication Instructions:  Your physician recommends that you continue on your current medications as directed. Please refer to the Current Medication list given to you today.  *If you need a refill on your cardiac medications before your next appointment, please call your pharmacy*   Lab Work: NONE   If you have labs (blood work) drawn today and your tests are completely normal, you will receive your results only by: . MyChart Message (if you have MyChart) OR . A paper copy in the mail If you have any lab test that is abnormal or we need to change your treatment, we will call you to review the results.   Testing/Procedures: NONE    Follow-Up: At [facility], you and your health needs are our priority.  As part of our continuing mission to provide you with exceptional heart care, we have created designated Provider Care Teams.  These Care Teams include your primary Cardiologist (physician) and Advanced Practice Providers (APPs -  Physician Assistants and Nurse Practitioners) who all work together to provide you with the care you need, when you need it.  We recommend signing up for the patient portal called "MyChart".  Sign up information is provided on this After Visit Summary.  MyChart is used to connect with patients for Virtual Visits (Telemedicine).  Patients are able to view lab/test results, encounter notes, upcoming appointments, etc.  Non-urgent messages can be sent to your provider as well.   To learn more about what you can do with MyChart, go to [email or web address].    Your next appointment:   3 month(s)  The format for your next appointment:   In Person  Provider:   Jonathan Branch, MD   Other Instructions Thank you for choosing [facility]!    

## 2020-10-31 ENCOUNTER — Ambulatory Visit: Payer: Medicaid Other | Admitting: Internal Medicine

## 2020-12-03 ENCOUNTER — Telehealth: Payer: Self-pay | Admitting: Cardiology

## 2020-12-03 MED ORDER — ENTRESTO 24-26 MG PO TABS: 1.0000 | ORAL_TABLET | Freq: Two times a day (BID) | ORAL | 11 refills | Status: DC

## 2020-12-03 MED ORDER — CARVEDILOL 6.25 MG PO TABS: 6.2500 mg | ORAL_TABLET | Freq: Two times a day (BID) | ORAL | 2 refills | Status: DC

## 2020-12-03 MED ORDER — SPIRONOLACTONE 25 MG PO TABS: 25.0000 mg | ORAL_TABLET | Freq: Every day | ORAL | 2 refills | Status: DC

## 2020-12-03 MED ORDER — FUROSEMIDE 40 MG PO TABS: 40.0000 mg | ORAL_TABLET | Freq: Two times a day (BID) | ORAL | 2 refills | Status: DC

## 2020-12-03 NOTE — Telephone Encounter (Signed)
*  STAT* If patient is at the pharmacy, call can be transferred to refill team.   1. Which medications need to be refilled? (please list name of each medication and dose if known)  all his medication - he has been out for 3 weeks   2. Which pharmacy/location (including street and city if local pharmacy) is medication to be sent to? [facility] [place]   3. Do they need a 30 day or 90 day supply? [place]

## 2020-12-03 NOTE — Telephone Encounter (Signed)
Completed.

## 2020-12-18 ENCOUNTER — Other Ambulatory Visit: Payer: Self-pay | Admitting: *Deleted

## 2020-12-18 MED ORDER — PANTOPRAZOLE SODIUM 40 MG PO TBEC: 40.0000 mg | DELAYED_RELEASE_TABLET | Freq: Every day | ORAL | 1 refills | Status: DC

## 2020-12-19 ENCOUNTER — Ambulatory Visit (INDEPENDENT_AMBULATORY_CARE_PROVIDER_SITE_OTHER): Payer: Medicare Other | Admitting: Internal Medicine

## 2020-12-19 ENCOUNTER — Other Ambulatory Visit: Payer: Self-pay

## 2020-12-19 ENCOUNTER — Encounter: Payer: Self-pay | Admitting: Internal Medicine

## 2020-12-19 VITALS — BP 112/79 | HR 74 | Temp 97.3°F | Ht 72.0 in | Wt 236.0 lb

## 2020-12-19 DIAGNOSIS — K769 Liver disease, unspecified: Secondary | ICD-10-CM

## 2020-12-19 DIAGNOSIS — K623 Rectal prolapse: Secondary | ICD-10-CM | POA: Diagnosis not present

## 2020-12-19 DIAGNOSIS — D500 Iron deficiency anemia secondary to blood loss (chronic): Secondary | ICD-10-CM | POA: Diagnosis not present

## 2020-12-19 DIAGNOSIS — K219 Gastro-esophageal reflux disease without esophagitis: Secondary | ICD-10-CM

## 2020-12-19 NOTE — Progress Notes (Signed)
Referring Provider: No ref. provider found Primary Care Physician:  Pcp, No Primary GI:  Dr. Abbey Chatters  Chief Complaint  Patient presents with   Anemia    Hosp f/u    HPI:   Dennis Swanson is a 65 y.o. male who presents to clinic today for follow-up visit.  Was admitted to [facility] 09/21/2020 for acute on chronic systolic/diastolic heart failure exacerbation complicated by acute respiratory failure.  During his hospitalization he was found to have iron deficiency anemia with hemoglobin 7.6, baseline normal.  Underwent EGD which was relatively unremarkable besides gastritis.  Stomach was incompletely visualized as it would not fully insufflate.  Colonoscopy revealed anal mass.  Biopsy showed rectal prolapse.  He is chronically on Eliquis for A. fib/flutter which was restarted upon discharge from the hospital.    Today states he feels well.  No recent CBC.  Continues to take iron supplementation.  Denies any melena hematochezia.  He was previously referred to rectal surgeon at [facility] after case was discussed with our general surgeons here locally.  Per reports, they have tried to contact patient without success to set up an appointment.  Did have CT imaging in the hospital which showed numerous cysts in his liver, did make mention of 1 indeterminate lesion approximately 9 mm.  Past Medical History:  Diagnosis Date   Acute pulmonary embolism ([place]) 02/16/2009   Alcohol abuse    Atrial flutter (HCC)    CHF (congestive heart failure) ([place])    a. EF 40-45% in 2011 b. EF at 60-65% in 04/2015 c. EF at 45-50% by echo in 09/2020   Hypertension    Lower extremity cellulitis    bilateral   Systolic dysfunction     Past Surgical History:  Procedure Laterality Date   COLONOSCOPY N/A 05/17/2012   Procedure: COLONOSCOPY;  Surgeon: Danie Binder, MD;  [facility];  Service: Endoscopy;  Laterality: N/A;  10:30   COLONOSCOPY WITH PROPOFOL N/A 09/15/2020    Procedure: COLONOSCOPY WITH PROPOFOL;  Surgeon: Eloise Harman, DO;  [facility];  Service: Endoscopy;  Laterality: N/A;   ESOPHAGOGASTRODUODENOSCOPY (EGD) WITH PROPOFOL N/A 09/15/2020   Procedure: ESOPHAGOGASTRODUODENOSCOPY (EGD) WITH PROPOFOL;  Surgeon: Eloise Harman, DO;  [facility];  Service: Endoscopy;  Laterality: N/A;   None      Current Outpatient Medications  Medication Sig Dispense Refill   apixaban (ELIQUIS) 5 MG TABS tablet Take 1 tablet (5 mg total) by mouth 2 (two) times daily. 60 tablet 1   carvedilol (COREG) 6.25 MG tablet Take 1 tablet (6.25 mg total) by mouth 2 (two) times daily with a meal. 180 tablet 2   ferrous sulfate 325 (65 FE) MG tablet Take 325 mg by mouth daily with breakfast.     furosemide (LASIX) 40 MG tablet Take 1 tablet (40 mg total) by mouth 2 (two) times daily. 180 tablet 2   hydrocortisone (ANUSOL-HC) 25 MG suppository Place 1 suppository (25 mg total) rectally 2 (two) times daily. 20 suppository 0   pantoprazole (PROTONIX) 40 MG tablet Take 1 tablet (40 mg total) by mouth daily. 30 tablet 1   sacubitril-valsartan (ENTRESTO) 24-26 MG Take 1 tablet by mouth 2 (two) times daily. 60 tablet 11   spironolactone (ALDACTONE) 25 MG tablet Take 1 tablet (25 mg total) by mouth daily. 90 tablet 2   No current facility-administered medications for this visit.    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  Tobacco Use   Smoking status: Never   Smokeless tobacco: Never   Vaping Use   Vaping Use: Never used  Substance and Sexual Activity   Alcohol use: Not Currently    Alcohol/week: 3.0 standard drinks    Types: 3 Cans of beer per week    Comment: once a week   Drug use: No   Sexual activity: Not Currently  Other Topics Concern   Not on file  Social History Narrative   Lives alone, 2 daughters   No regular exercise   Social Determinants of Health   Financial Resource Strain: Low Risk    Difficulty of Paying Living Expenses: Not very hard  Food Insecurity: No Food Insecurity   Worried About Charity fundraiser in the Last Year: Never true   [place] in the Last Year: Never true  Transportation Needs: No Transportation Needs   Lack of Transportation (Medical): No   Lack of Transportation (Non-Medical): No  Physical Activity: Inactive   Days of Exercise per Week: 0 days   Minutes of Exercise per Session: 0 min  Stress: Not on file  Social Connections: Not on file    Subjective: Review of Systems  Constitutional:  Negative for chills and fever.  HENT:  Negative for congestion and hearing loss.   Eyes:  Negative for blurred vision and double vision.  Respiratory:  Negative for cough and shortness of breath.   Cardiovascular:  Negative for chest pain and palpitations.  Gastrointestinal:  Negative for abdominal pain, blood in stool, constipation, diarrhea, heartburn, melena and vomiting.  Genitourinary:  Negative for dysuria and urgency.  Musculoskeletal:  Negative for joint pain and myalgias.  Skin:  Negative for itching and rash.  Neurological:  Negative for dizziness and headaches.  Psychiatric/Behavioral:  Negative for depression. The patient is not nervous/anxious.     Objective: BP 112/79   Pulse 74   Temp (!) 97.3 F (36.3 C) (Temporal)   Ht 6' (1.829 m)   Wt 236 lb (107 kg)   BMI 32.01 kg/m  Physical Exam Constitutional:      Appearance: Normal appearance.  HENT:     Head: Normocephalic and atraumatic.  Eyes:      Extraocular Movements: Extraocular movements intact.     Conjunctiva/sclera: Conjunctivae normal.  Cardiovascular:     Rate and Rhythm: Normal rate and regular rhythm.  Pulmonary:     Effort: Pulmonary effort is normal.     Breath sounds: Normal breath sounds.  Abdominal:     General: Bowel sounds are normal.     Palpations: Abdomen is soft.  Musculoskeletal:        General: Normal range of motion.     Cervical back: Normal range of motion and neck supple.  Skin:    General: Skin is warm.  Neurological:     General: No focal deficit present.  Mental Status: He is alert and oriented to person, place, and time.  Psychiatric:        Mood and Affect: Mood normal.        Behavior: Behavior normal.     Assessment: *Anemia due to chronic blood loss *Rectal prolapse *GERD-well-controlled on pantoprazole *Liver cysts with 1 indeterminate lesion  Plan: Patient clinically doing well today.  We will check CBC and iron studies.  Continue on iron supplementation for now.  Previously referred to rectal surgeon at [facility] for his rectal prolapse.  Per reports they have attempted to contact patient multiple times without success.  I have recommended the patient call their office to schedule appointment.  GERD well-controlled on pantoprazole.  We will continue.  Patient with multiple liver cysts and noted 1 indeterminate lesion.  Consider repeat imaging in 6 to 12 months.  Follow-up with GI in 6 months.  12/19/2020 10:43 AM   Disclaimer: This note was dictated with voice recognition software. Similar sounding words can inadvertently be transcribed and may not be corrected upon review.

## 2020-12-19 NOTE — Patient Instructions (Signed)
I am happy to hear that you are feeling better.  I am going to check your blood counts and iron studies today.  Continue on iron supplementation.  These labs will need to be done at the hospital.  You will need to call [facility] to schedule an appointment with rectal surgeon to discuss your rectal prolapse.  Follow-up with GI in 6 months.  It was good seeing you again today.  Dr. Abbey Chatters  At [facility] we value your feedback. You may receive a survey about your visit today. Please share your experience as we strive to create trusting relationships with our patients to provide genuine, compassionate, quality care.  We appreciate your understanding and patience as we review any laboratory studies, imaging, and other diagnostic tests that are ordered as we care for you. Our office policy is 5 business days for review of these results, and any emergent or urgent results are addressed in a timely manner for your best interest. If you do not hear from our office in 1 week, please contact us.   We also encourage the use of MyChart, which contains your medical information for your review as well. If you are not enrolled in this feature, an access code is on this after visit summary for your convenience. Thank you for allowing [place] to be involved in your care.  It was great to see you today!  I hope you have a great rest of your Fall!    Elon Alas. Abbey Chatters, D.O. Gastroenterology and Hepatology [facility]

## 2021-01-16 ENCOUNTER — Encounter: Payer: Self-pay | Admitting: Cardiology

## 2021-01-16 ENCOUNTER — Ambulatory Visit: Payer: Medicaid Other | Admitting: Cardiology

## 2021-01-16 NOTE — Progress Notes (Deleted)
Clinical Summary Dennis Swanson is a 65 y.o.male seen today for follow up of the following medical problems.    1. Afib  - no recent palpitaitons. No bleeding on xarelto.    2. Chronic systolic/diastolic HF - echo 7902 LVEF 40-45% - 04/2015 LVEF 40-97%, grade I diastolic dysfunction.    - occasional SOB at times. Often with nasal congestion - no recent edema - compliant with meds.   08/2020 echo LVEF 45-50%, severe RVE with severe RV dysfunction, severe TR, mod pulm HTN PASP 57, severe BAE     3. Mitral regurgitation - mild to mod by echo 2011 - repeat echo 04/2015 MR is mild.    - denies any recent symptoms     4. HTN - compliant with meds   5. Hyperlipidemia -compliant with meds, labs followed by pcp     6. OSA screen - no snoring at night, no apneic episodes, no daytime somnolence *needs sleep eval   7. EtOH abuse  Past Medical History:  Diagnosis Date   Acute pulmonary embolism ([place]) 02/16/2009   Alcohol abuse    Atrial flutter ([facility])    CHF (congestive heart failure) ([place])    a. EF 40-45% in 2011 b. EF at 60-65% in 04/2015 c. EF at 45-50% by echo in 09/2020   Hypertension    Lower extremity cellulitis    bilateral   Systolic dysfunction      No Known Allergies   Current Outpatient Medications  Medication Sig Dispense Refill   apixaban (ELIQUIS) 5 MG TABS tablet Take 1 tablet (5 mg total) by mouth 2 (two) times daily. 60 tablet 1   carvedilol (COREG) 6.25 MG tablet Take 1 tablet (6.25 mg total) by mouth 2 (two) times daily with a meal. 180 tablet 2   ferrous sulfate 325 (65 FE) MG tablet Take 325 mg by mouth daily with breakfast.     furosemide (LASIX) 40 MG tablet Take 1 tablet (40 mg total) by mouth 2 (two) times daily. 180 tablet 2   hydrocortisone (ANUSOL-HC) 25 MG suppository Place 1 suppository (25 mg total) rectally 2 (two) times daily. 20 suppository 0   pantoprazole (PROTONIX) 40 MG tablet Take 1 tablet (40 mg total) by mouth daily. 30  tablet 1   sacubitril-valsartan (ENTRESTO) 24-26 MG Take 1 tablet by mouth 2 (two) times daily. 60 tablet 11   spironolactone (ALDACTONE) 25 MG tablet Take 1 tablet (25 mg total) by mouth daily. 90 tablet 2   No current facility-administered medications for this visit.     Past Surgical History:  Procedure Laterality Date   COLONOSCOPY N/A 05/17/2012   Procedure: COLONOSCOPY;  Surgeon: Danie Binder, MD;  [facility];  Service: Endoscopy;  Laterality: N/A;  10:30   COLONOSCOPY WITH PROPOFOL N/A 09/15/2020   Procedure: COLONOSCOPY WITH PROPOFOL;  Surgeon: Eloise Harman, DO;  [facility];  Service: Endoscopy;  Laterality: N/A;   ESOPHAGOGASTRODUODENOSCOPY (EGD) WITH PROPOFOL N/A 09/15/2020   Procedure: ESOPHAGOGASTRODUODENOSCOPY (EGD) WITH PROPOFOL;  Surgeon: Eloise Harman, DO;  [facility];  Service: Endoscopy;  Laterality: N/A;   None       No Known Allergies    Family History  Problem Relation Age of Onset   Heart attack Mother    Hypertension Brother    Diabetes Brother    Cancer Father    Colon cancer Neg Hx      Social History Mr. Favila reports that he  has never smoked. He has never used smokeless tobacco. Mr. Kitner reports that he does not currently use alcohol after a past usage of about 3.0 standard drinks per week.   Review of Systems CONSTITUTIONAL: No weight loss, fever, chills, weakness or fatigue.  HEENT: Eyes: No visual loss, blurred vision, double vision or yellow sclerae.No hearing loss, sneezing, congestion, runny nose or sore throat.  SKIN: No rash or itching.  CARDIOVASCULAR:  RESPIRATORY: No shortness of breath, cough or sputum.  GASTROINTESTINAL: No anorexia, nausea, vomiting or diarrhea. No abdominal pain or blood.  GENITOURINARY: No burning on urination, no polyuria NEUROLOGICAL: No headache, dizziness, syncope, paralysis, ataxia, numbness or tingling in the extremities. No change in bowel or  bladder control.  MUSCULOSKELETAL: No muscle, back pain, joint pain or stiffness.  LYMPHATICS: No enlarged nodes. No history of splenectomy.  PSYCHIATRIC: No history of depression or anxiety.  ENDOCRINOLOGIC: No reports of sweating, cold or heat intolerance. No polyuria or polydipsia.  Marland Kitchen   Physical Examination There were no vitals filed for this visit. There were no vitals filed for this visit.  Gen: resting comfortably, no acute distress HEENT: no scleral icterus, pupils equal round and reactive, no palptable cervical adenopathy,  CV Resp: Clear to auscultation bilaterally GI: abdomen is soft, non-tender, non-distended, normal bowel sounds, no hepatosplenomegaly MSK: extremities are warm, no edema.  Skin: warm, no rash Neuro:  no focal deficits Psych: appropriate affect   Diagnostic Studies  Study Conclusions   - Left ventricle: The cavity size was normal. Wall thickness was   increased in a pattern of moderate LVH. Systolic function was   normal. The estimated ejection fraction was in the range of 60%   to 65%. Doppler parameters are consistent with abnormal left   ventricular relaxation (grade 1 diastolic dysfunction). - Aortic valve: Mildly calcified annulus. Trileaflet; mildly   thickened leaflets. Valve area (VTI): 2.5 cm^2. Valve area   (Vmax): 2.38 cm^2. - Mitral valve: Mildly calcified annulus. Mildly thickened leaflets   . There was mild regurgitation. - Technically adequate study.   08/2020 echo IMPRESSIONS     1. Right ventricular systolic function is severely reduced. The right  ventricular size is severely enlarged. There is moderately elevated  pulmonary artery systolic pressure. The estimated right ventricular  systolic pressure is 44.8 mmHg.   2. 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   3. Left ventricular ejection fraction, by estimation, is 45 to 50%. The  left ventricle has low normal function. The left  ventricle demonstrates  global hypokinesis. There is moderate left ventricular hypertrophy. Left  ventricular diastolic parameters are   indeterminate. There is the interventricular septum is flattened in  systole and diastole, consistent with right ventricular pressure and  volume overload.   4. Left atrial size was severely dilated.   5. Right atrial size was severely dilated.   6. The mitral valve is grossly normal. Mild mitral valve regurgitation.  No evidence of mitral stenosis.   7. The aortic valve is tricuspid. There is mild calcification of the  aortic valve. Aortic valve regurgitation is trivial. No aortic stenosis is  present.   8. Aortic dilatation noted. There is mild dilatation of the ascending  aorta, measuring 40 mm.   9. The inferior vena cava is dilated in size with <50% respiratory  variability, suggesting right atrial pressure of 15 mmHg.  10. Cannot exclude small PFO.   Assessment and Plan  1. Afib - CHADS2Vasc score is  3, continue anticoag  - no symptoms, continue current meds   2. Chronic systolic/diastolic HF - LVEF has normalized, continues to have some diastolic dysfunction.  - no recent symptoms, continue current meds   3. Mitral regurgitation - mild by recent echo - we will continue to monitor.    4. HTN - bp at goal, continue current meds      Arnoldo Lenis, M.D., F.A.C.C.

## 2021-03-11 ENCOUNTER — Ambulatory Visit: Payer: Self-pay | Admitting: Surgery

## 2021-03-13 ENCOUNTER — Telehealth: Payer: Self-pay | Admitting: Cardiology

## 2021-03-13 DIAGNOSIS — I700 Atherosclerosis of aorta: Secondary | ICD-10-CM

## 2021-03-13 MED ORDER — PANTOPRAZOLE SODIUM 40 MG PO TBEC: 40.0000 mg | DELAYED_RELEASE_TABLET | Freq: Every day | ORAL | 1 refills | Status: DC

## 2021-03-13 MED ORDER — CARVEDILOL 6.25 MG PO TABS: 6.2500 mg | ORAL_TABLET | Freq: Two times a day (BID) | ORAL | 1 refills | Status: DC

## 2021-03-13 NOTE — Telephone Encounter (Signed)
Patient with diagnosis of afib on Eliquis for anticoagulation.    Procedure: Hemorrhoidectomy Date of procedure: TBD  CHA2DS2-VASc Score = 5  This indicates a 7.2% annual risk of stroke. The patient's score is based upon: CHF History: 1 HTN History: 1 Diabetes History: 1 Stroke History: 0 Vascular Disease History: 1 Age Score: 1 Gender Score: 0   PE in 2011  CrCl 4mL/min using adjusted body weight Platelet count 231K  Per office protocol, patient can hold Eliquis for 1-2 days prior to procedure.

## 2021-03-13 NOTE — Telephone Encounter (Signed)
°*  STAT* If patient is at the pharmacy, call can be transferred to refill team.   1. Which medications need to be refilled? (please list name of each medication and dose if known)  carvedilol (COREG) 6.25 MG tablet pantoprazole (PROTONIX) 40 MG tablet  2. Which pharmacy/location (including street and city if local pharmacy) is medication to be sent to?[place], [place] [address]  3. Do they need a 30 day or 90 day supply? 90 day

## 2021-03-13 NOTE — Telephone Encounter (Signed)
Pt needs follow up appt for further refills. Refilled medication 30 days. Left a message for pt to schedule appt.

## 2021-03-13 NOTE — Telephone Encounter (Signed)
° ° °  Patient Name: Dennis Swanson  DOB: 02/25/1955 MRN: [id number]  Primary Cardiologist: Carlyle Dolly, MD  Chart reviewed as part of pre-operative protocol coverage. Given past medical history and time since last visit, based on ACC/AHA guidelines, DONTRE LADUCA would be at acceptable risk for the planned procedure without further cardiovascular testing. The is doing well since last office visit. He is getting > 4 mets of activity.   Pharmacy to review anticoagulation.   [place], [place] 03/13/2021, 10:28 AM

## 2021-03-13 NOTE — Telephone Encounter (Signed)
° °  Pre-operative Risk Assessment    Patient Name: Dennis Swanson  DOB: [birth year] MRN: [id number]      Request for Surgical Clearance    Procedure:   Hemorrhoidectomy  Date of Surgery:  Clearance TBD                                 Surgeon:  not indicated  Surgeon's Group or Practice Name:  [facility]  Phone number:  [phone] Fax number:  [phone]   Type of Clearance Requested:   Medical  - Pharmacy:  Hold Apixaban (Eliquis) does not indicate how long. They are asking for "Instructions as to how the patient should HOLD medication preoperatively"   Type of Anesthesia:  General    Additional requests/questions:    Sandrea Hammond   03/13/2021, 8:02 AM

## 2021-03-19 ENCOUNTER — Encounter: Payer: Self-pay | Admitting: Gastroenterology

## 2021-03-19 NOTE — Progress Notes (Signed)
Referring Provider: No ref. provider found Primary Care Physician:  Monico Blitz, MD Primary GI Physician: Dr. Abbey Chatters  Chief Complaint  Patient presents with   Hemorrhoids    Scheduled for hemorrhoid surgery 2/24    HPI:   Dennis Swanson is a 66 y.o. 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   Last seen in our office in November 2022.  He was feeling well at that time.  Continue taking iron supplement and denied overt GI bleeding.  He had not yet seen [place] surgery.  Plan to check CBC, iron studies, continue iron supplementation, continue Protonix, patient advised to call [place] surgery to schedule appointment, consider repeat imaging in 6 to 12 months to follow-up on multiple liver cyst and indeterminate liver lesion.  Labs: not completed.   Patient saw Dr. Johney Maine on 03/11/2021.  Suspected the mass prolapsing from patient's anus was likely a chronically irritated hemorrhoid.  Recommended hemorrhoidectomy.  He was also noted to have hernias in bilateral groin areas.  Recommended hernia surgery 3 months after hemorrhoid surgery.   Today:  Reports he is doing well overall.  He has not had any bright red blood per rectum or melena since being hospitalized last year in July.  He is scheduled for hemorrhoid surgery on 2/24 with Dr. Johney Maine.  Reports that the hemorrhoid has not caused him any problems in quite some time.  He is still taking his iron tablet every day and Protonix daily.  He denies nausea, vomiting, reflux symptoms, dysphagia, abdominal pain, constipation, diarrhea.   Denies NSAIDs.  Drinks 1-2 beers a week.  No unintentional weight loss.  Past Medical History:  Diagnosis Date   Acute pulmonary embolism ([place]) 02/16/2009   Alcohol abuse    Atrial flutter (H[facility])    CHF (congestive heart failure) ([place])    a. EF 40-45% in 2011 b. EF at 60-65% in 04/2015 c. EF at 45-50% by echo in 09/2020   Hypertension    Lower extremity cellulitis    bilateral   Systolic dysfunction     Past Surgical History:  Procedure Laterality Date   COLONOSCOPY N/A 05/17/2012   Procedure: COLONOSCOPY;  Surgeon: Danie Binder, MD;  [facility];  Service: Endoscopy;  Laterality: N/A;  10:30   COLONOSCOPY WITH PROPOFOL N/A 09/15/2020   Surgeon: Hurshel Keys K, DO;  2 cm rubbery textured nodular anal mass, internal hemorrhoids, external hemorrhoids, sigmoid and descending diverticula, redundant colon.  Pathology revealed polypoid rectal mucosa with acute and chronic inflammation with erosion, granular hyperplasia and macular isolation of lamina propria, consistent with mucosal prolapse.   ESOPHAGOGASTRODUODENOSCOPY (EGD) WITH PROPOFOL N/A 09/15/2020   Surgeon: Eloise Harman, DO; Small hiatal hernia, gastritis biopsied, normal examined duodenum.  Biopsy with no specific histopathologic changes, negative for H. pylori.   None      Current Outpatient Medications  Medication Sig Dispense Refill   apixaban (ELIQUIS) 5 MG TABS tablet Take 1 tablet (5 mg total) by mouth 2 (two) times daily. 60 tablet 1   carvedilol (COREG) 6.25 MG tablet Take 1 tablet (6.25 mg  total) by mouth 2 (two) times daily with a meal. 60 tablet 1   ferrous sulfate 325 (65 FE) MG tablet Take 325 mg by mouth daily with breakfast.     furosemide (LASIX) 40 MG tablet Take 1 tablet (40 mg total) by mouth 2 (two) times daily. 180 tablet 2   hydrocortisone (ANUSOL-HC) 25 MG suppository Place 1 suppository (25 mg total) rectally 2 (two) times daily. 20 suppository 0   pantoprazole (PROTONIX) 40 MG tablet  Take 1 tablet (40 mg total) by mouth daily. 30 tablet 1   sacubitril-valsartan (ENTRESTO) 24-26 MG Take 1 tablet by mouth 2 (two) times daily. 60 tablet 11   spironolactone (ALDACTONE) 25 MG tablet Take 1 tablet (25 mg total) by mouth daily. 90 tablet 2   No current facility-administered medications for this visit.    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  Tobacco Use   Smoking status: Never   Smokeless tobacco: Never  Vaping Use   Vaping Use: Never used  Substance and Sexual Activity   Alcohol use: Yes    Alcohol/week: 3.0 standard drinks    Types: 3 Cans of beer per week    Comment: 1-2 beer a week   Drug use: No   Sexual activity: Not Currently  Other Topics Concern   Not on file  Social History Narrative   Lives alone, 2 daughters   No regular exercise   Social Determinants of Health   Financial Resource Strain: Low Risk    Difficulty of Paying Living Expenses: Not very hard  Food Insecurity: No Food Insecurity   Worried About Charity fundraiser in the Last Year: Never true   Ran Out of Food in the Last Year: Never true  Transportation Needs: No Transportation Needs   Lack of Transportation (Medical): No   Lack of Transportation (Non-Medical): No  Physical Activity: Inactive   Days of Exercise per Week: 0 days   Minutes of Exercise per Session: 0 min  Stress: Not on file  Social Connections: Not on file    Review of Systems: Gen: Denies fever, chills, cold or flulike symptoms, presyncope,  syncope. CV: Denies chest pain, palpitations. Resp: Denies dyspnea or cough. GI: See HPI Heme: See HPI  Physical Exam: BP (!) 131/91    Pulse 91    Temp (!) 97.1 F (36.2 C)    Ht 6' (1.829 m)    Wt 245 lb (111.1 kg)    BMI 33.23 kg/m  General:   Alert and oriented. No distress noted. Pleasant and cooperative.  Head:  Normocephalic and atraumatic. Eyes:  Conjuctiva clear without scleral icterus. Heart:  S1, S2 present without murmurs appreciated. Lungs:  Clear to auscultation bilaterally. No wheezes, rales, or rhonchi. No distress.  Abdomen:  +BS, soft, non-tender and non-distended. No rebound or guarding. No HSM or masses noted. Msk:  Symmetrical without gross deformities. Normal posture. Extremities:  Without edema. Neurologic:  Alert and  oriented x4 Psych: Normal mood and affect.    Assessment: 67 year old male with history of GERD, liver cyst with indeterminate liver lesion noted on CT in August 2022, found to have IDA in July 2022 with hemoglobin of 7.3 s/p EGD and colonoscopy at that time revealing gastritis, internal and external hemorrhoids, 2 cm anal mass with biopsies consistent with rectal prolapse with recommendations to see colorectal surgeon, presenting today for follow-up.  Liver lesions: CT A/P with contrast on 09/16/2020 revealed several low-density well-circumscribed lesions in the right lobe which are likely cyst, largest measuring 14 mm.  Also an ill-defined low-density lesion in the left lobe liver measuring 0.9 cm, indeterminate.  We will plan for MRI with liver protocol to follow-up and further characterize liver lesions.  IDA: Found to have iron deficiency anemia with hemoglobin of 7.3 in July 2022.  Prior baseline was 14.4 in 2018.  He reported melena and hematochezia at that time and underwent EGD and colonoscopy revealing gastritis, internal and external hemorrhoids, 2 cm anal mass with biopsies consistent with rectal prolapse and referred to colorectal surgery.   He saw Dr. Michael Boston who suspected this lesion was likely a chronically irritated hemorrhoid.  Patient reports he is scheduled for hemorrhoidectomy on 2/24.  He denies overt GI bleeding since his hospitalization last year.  He has maintained on oral iron and Protonix daily and denies NSAIDs.  He has not had any blood work updated since his hospitalization.  I am not sure that gastritis and bleeding from an inflamed hemorrhoid caused iron deficiency anemia with a hemoglobin of 7.3.  At this point, we will plan to update his labs.  Depending on his hemoglobin, we may need to consider capsule endoscopy in the future for further evaluation.    Plan:  CBC and iron panel with ferritin. MRI liver with and without contrast. Continue iron daily for now. Continue Protonix 40 mg daily for now. Further recommendations to follow blood work and MRI.   Aliene Altes, PA-C [facility] 03/20/2021

## 2021-03-20 ENCOUNTER — Ambulatory Visit (INDEPENDENT_AMBULATORY_CARE_PROVIDER_SITE_OTHER): Payer: Medicare Other | Admitting: Gastroenterology

## 2021-03-20 ENCOUNTER — Telehealth: Payer: Self-pay

## 2021-03-20 ENCOUNTER — Encounter: Payer: Self-pay | Admitting: Gastroenterology

## 2021-03-20 ENCOUNTER — Other Ambulatory Visit: Payer: Self-pay

## 2021-03-20 VITALS — BP 131/91 | HR 91 | Temp 97.1°F | Ht 72.0 in | Wt 245.0 lb

## 2021-03-20 DIAGNOSIS — D509 Iron deficiency anemia, unspecified: Secondary | ICD-10-CM | POA: Diagnosis not present

## 2021-03-20 DIAGNOSIS — K769 Liver disease, unspecified: Secondary | ICD-10-CM | POA: Insufficient documentation

## 2021-03-20 NOTE — Telephone Encounter (Signed)
No PA needed for MRI Liver w/wo contrast per [facility] website.

## 2021-03-20 NOTE — Patient Instructions (Signed)
Please have blood work completed at [facility].  We will arrange you to have an MRI of your liver to follow-up on the lesions that were seen on your prior CT scan last year.  Continue taking iron daily for now.  Continue Protonix 40 mg daily for now.  Further recommendations to follow your blood work and MRI.   It was good to see you today!  I am glad you are doing well!  Aliene Altes, PA-C [facility]

## 2021-03-21 ENCOUNTER — Other Ambulatory Visit: Payer: Self-pay | Admitting: *Deleted

## 2021-03-21 ENCOUNTER — Telehealth: Payer: Self-pay | Admitting: Internal Medicine

## 2021-03-21 DIAGNOSIS — D509 Iron deficiency anemia, unspecified: Secondary | ICD-10-CM

## 2021-03-21 LAB — IRON,TIBC AND FERRITIN PANEL
%SAT: 31 % (calc) (ref 20–48)
Ferritin: 101 ng/mL (ref 24–380)
Iron: 102 ug/dL (ref 50–180)
TIBC: 329 mcg/dL (calc) (ref 250–425)

## 2021-03-21 LAB — CBC WITH DIFFERENTIAL/PLATELET
Absolute Monocytes: 379 cells/uL (ref 200–950)
Basophils Absolute: 38 cells/uL (ref 0–200)
Basophils Relative: 0.8 %
Eosinophils Absolute: 62 cells/uL (ref 15–500)
Eosinophils Relative: 1.3 %
HCT: 46.1 % (ref 38.5–50.0)
Hemoglobin: 15.1 g/dL (ref 13.2–17.1)
Lymphs Abs: 1747 cells/uL (ref 850–3900)
MCH: 29.8 pg (ref 27.0–33.0)
MCHC: 32.8 g/dL (ref 32.0–36.0)
MCV: 91.1 fL (ref 80.0–100.0)
MPV: 11.8 fL (ref 7.5–12.5)
Monocytes Relative: 7.9 %
Neutro Abs: 2573 cells/uL (ref 1500–7800)
Neutrophils Relative %: 53.6 %
Platelets: 190 10*3/uL (ref 140–400)
RBC: 5.06 10*6/uL (ref 4.20–5.80)
RDW: 15.1 % — ABNORMAL HIGH (ref 11.0–15.0)
Total Lymphocyte: 36.4 %
WBC: 4.8 10*3/uL (ref 3.8–10.8)

## 2021-03-21 NOTE — Telephone Encounter (Signed)
Patient called Friday, believed he was returning a call

## 2021-04-01 ENCOUNTER — Other Ambulatory Visit: Payer: Self-pay

## 2021-04-01 ENCOUNTER — Ambulatory Visit (HOSPITAL_COMMUNITY)
Admission: RE | Admit: 2021-04-01 | Discharge: 2021-04-01 | Disposition: A | Discharge status: Home / Self Care | Payer: Medicare Other | Source: Ambulatory Visit | Attending: Gastroenterology | Admitting: Gastroenterology

## 2021-04-01 DIAGNOSIS — K769 Liver disease, unspecified: Secondary | ICD-10-CM | POA: Diagnosis present

## 2021-04-01 MED ORDER — GADOBUTROL 1 MMOL/ML IV SOLN
10.0000 mL | Freq: Once | INTRAVENOUS | Status: AC | PRN
  Administered 2021-04-01: 10 mL via INTRAVENOUS

## 2021-04-04 NOTE — Patient Instructions (Addendum)
DUE TO COVID-19 ONLY ONE VISITOR IS ALLOWED TO COME WITH YOU AND STAY IN THE WAITING ROOM ONLY DURING PRE OP AND PROCEDURE.   **NO VISITORS ARE ALLOWED IN THE SHORT STAY AREA OR RECOVERY ROOM!!**  IF YOU WILL BE ADMITTED INTO THE HOSPITAL YOU ARE ALLOWED ONLY TWO SUPPORT PEOPLE DURING VISITATION HOURS ONLY (7 AM -8PM)   The support person(s) must pass our screening, gel in and out, and wear a mask at all times, including in the patients room. Patients must also wear a mask when staff or their support person are in the room. Visitors GUEST BADGE MUST BE WORN VISIBLY  One adult visitor may remain with you overnight and MUST be in the room by 8 P.M.  No visitors under the age of 78. Any visitor under the age of 68 must be accompanied by an adult.        Your procedure is scheduled on: 04/17/21   Report to [facility] Main Entrance    Report to admitting at : 1:45 PM   Call this number if you have problems the morning of surgery [phone]  CLEAR LIQUID DIET: STARTING THE [place]: 1:00 PM  Foods Allowed                                                                     Foods Excluded  Water, Black Coffee and tea, regular and decaf                             liquids that you cannot  Plain Jell-O in any flavor  (No red)                                           see through such as: Fruit ices (not with fruit pulp)                                     milk, soups, orange juice              Iced Popsicles (No red)                                    All solid food                                   Apple juices Sports drinks like Gatorade (No red) Lightly seasoned clear broth or consume(fat free) Sugar Sample Menu Breakfast                                Lunch                                     Supper Cranberry juice  Beef broth                            Chicken broth Jell-O                                     Grape juice                            Apple juice Coffee or tea                        Jell-O                                      Popsicle                                                Coffee or tea                        Coffee or tea   Complete one Ensure drink the morning of surgery 3 hours prior to scheduled surgery at: 1:00 PM.    The day of surgery:  Drink ONE (1) Pre-Surgery Clear Ensure or G2 at AM the morning of surgery. Drink in one sitting. Do not sip.  This drink was given to you during your hospital  pre-op appointment visit. Nothing else to drink after completing the  Pre-Surgery Clear Ensure or G2.          If you have questions, please contact your surgeons office.  FOLLOW BOWEL PREP AND ANY ADDITIONAL PRE OP INSTRUCTIONS YOU RECEIVED FROM YOUR SURGEON'S OFFICE!!!  Obtain a bottle of Milk of Magnesia at a pharmacy      DAY PRIOR TO SURGERY:   Switch to drinking liquids or pureed foods only  Drink plenty of liquids all day to avoid getting dehydrated   10:00am: Take 2 oz (4 tablespoons) Milk of Magnesia.   2:00pm:  Take 2 oz (4 tablespoons) Milk of Magnesia.   Midnight:  Do not eat anything solid after bedtime (midnight) the night before your surgery.  BUT DO drink plenty of clear liquids (Water, Gatorade, juice, soda, coffee, tea, broths, etc.) up to 2 hours prior to surgery to avoid getting dehydrated.   MORNING OF SURGERY   Remember to not to eat anything solid that morning  Hold or take medications as recommended by the hospital staff at your Preoperative visit   Stop drinking liquids before you leave the house (>3 hours prior to surgery)    If you have questions or problems,  please call [place]  to speak to someone in the clinic department at our office   Oral Hygiene is also important to reduce your risk of infection.                                    Remember - BRUSH YOUR TEETH THE MORNING OF SURGERY WITH YOUR REGULAR TOOTHPASTE   Do NOT smoke after  Midnight  Take these medicines the morning of surgery with A SIP OF WATER: carvedilol,pantoprazole.  DO NOT TAKE ANY ORAL DIABETIC MEDICATIONS DAY OF YOUR SURGERY                              You may not have any metal on your body including hair pins, jewelry, and body piercing             Do not wear lotions, powders, perfumes/cologne, or deodorant              Men may shave face and neck.   Do not bring valuables to the hospital. [place].   Contacts, dentures or bridgework may not be worn into surgery.   Bring small overnight bag day of surgery.    Patients discharged on the day of surgery will not be allowed to drive home.  Someone needs to stay with you for the first 24 hours after anesthesia.   Special Instructions: Bring a copy of your healthcare power of attorney and living will documents         the day of surgery if you haven't scanned them before.              Please read over the following fact sheets you were given: IF YOU HAVE QUESTIONS ABOUT YOUR PRE-OP INSTRUCTIONS PLEASE CALL [phone]     [facility] - Preparing for Surgery Before surgery, you can play an important role.  Because skin is not sterile, your skin needs to be as free of germs as possible.  You can reduce the number of germs on your skin by washing with CHG (chlorahexidine gluconate) soap before surgery.  CHG is an antiseptic cleaner which kills germs and bonds with the skin to continue killing germs even after washing. Please DO NOT use if you have an allergy to CHG or antibacterial soaps.  If your skin becomes reddened/irritated stop using the CHG and inform your nurse when you arrive at Short Stay. Do not shave (including legs and underarms) for at least 48 hours prior to the first CHG shower.  You may shave your face/neck. Please follow these instructions carefully:  1.  Shower with CHG Soap the night before surgery and the  morning of Surgery.  2.   If you choose to wash your hair, wash your hair first as usual with your  normal  shampoo.  3.  After you shampoo, rinse your hair and body thoroughly to remove the  shampoo.                           4.  Use CHG as you would any other liquid soap.  You can apply chg directly  to the skin and wash                       Gently with a scrungie or clean washcloth.  5.  Apply the CHG Soap to your body ONLY FROM THE NECK DOWN.   Do not use on face/ open                           Wound or open sores. Avoid contact with eyes, ears mouth and genitals (private parts).  Wash face,  Genitals (private parts) with your normal soap.             6.  Wash thoroughly, paying special attention to the area where your surgery  will be performed.  7.  Thoroughly rinse your body with warm water from the neck down.  8.  DO NOT shower/wash with your normal soap after using and rinsing off  the CHG Soap.                9.  Pat yourself dry with a clean towel.            10.  Wear clean pajamas.            11.  Place clean sheets on your bed the night of your first shower and do not  sleep with pets. Day of Surgery : Do not apply any lotions/deodorants the morning of surgery.  Please wear clean clothes to the hospital/surgery center.  FAILURE TO FOLLOW THESE INSTRUCTIONS MAY RESULT IN THE CANCELLATION OF YOUR SURGERY PATIENT SIGNATURE_________________________________  NURSE SIGNATURE__________________________________  ________________________________________________________________________

## 2021-04-07 ENCOUNTER — Other Ambulatory Visit: Payer: Self-pay

## 2021-04-07 ENCOUNTER — Encounter (HOSPITAL_COMMUNITY): Payer: Self-pay

## 2021-04-07 ENCOUNTER — Encounter (HOSPITAL_COMMUNITY)
Admission: RE | Admit: 2021-04-07 | Discharge: 2021-04-07 | Disposition: A | Discharge status: Home / Self Care | Payer: Medicare Other | Source: Ambulatory Visit | Attending: Surgery | Admitting: Surgery

## 2021-04-07 VITALS — BP 150/103 | HR 80 | Temp 98.2°F | Ht 72.0 in | Wt 248.0 lb

## 2021-04-07 DIAGNOSIS — I110 Hypertensive heart disease with heart failure: Secondary | ICD-10-CM | POA: Diagnosis not present

## 2021-04-07 DIAGNOSIS — K648 Other hemorrhoids: Secondary | ICD-10-CM | POA: Insufficient documentation

## 2021-04-07 DIAGNOSIS — I4892 Unspecified atrial flutter: Secondary | ICD-10-CM | POA: Diagnosis not present

## 2021-04-07 DIAGNOSIS — Z7901 Long term (current) use of anticoagulants: Secondary | ICD-10-CM | POA: Diagnosis not present

## 2021-04-07 DIAGNOSIS — Z86711 Personal history of pulmonary embolism: Secondary | ICD-10-CM | POA: Diagnosis not present

## 2021-04-07 DIAGNOSIS — I509 Heart failure, unspecified: Secondary | ICD-10-CM | POA: Insufficient documentation

## 2021-04-07 DIAGNOSIS — I10 Essential (primary) hypertension: Secondary | ICD-10-CM

## 2021-04-07 DIAGNOSIS — Z01812 Encounter for preprocedural laboratory examination: Secondary | ICD-10-CM | POA: Insufficient documentation

## 2021-04-07 HISTORY — DX: Cardiac arrhythmia, unspecified: I49.9

## 2021-04-07 LAB — BASIC METABOLIC PANEL
Anion gap: 9 (ref 5–15)
BUN: 12 mg/dL (ref 8–23)
CO2: 31 mmol/L (ref 22–32)
Calcium: 9.5 mg/dL (ref 8.9–10.3)
Chloride: 100 mmol/L (ref 98–111)
Creatinine, Ser: 0.94 mg/dL (ref 0.61–1.24)
GFR, Estimated: >60 mL/min (ref >60)
Glucose, Bld: 93 mg/dL (ref 70–99)
Potassium: 4.5 mmol/L (ref 3.5–5.1)
Sodium: 140 mmol/L (ref 135–145)

## 2021-04-07 LAB — CBC
HCT: 43.7 % (ref 39.0–52.0)
Hemoglobin: 14.2 g/dL (ref 13.0–17.0)
MCH: 30.4 pg (ref 26.0–34.0)
MCHC: 32.5 g/dL (ref 30.0–36.0)
MCV: 93.6 fL (ref 80.0–100.0)
Platelets: 193 10*3/uL (ref 150–400)
RBC: 4.67 MIL/uL (ref 4.22–5.81)
RDW: 14.2 % (ref 11.5–15.5)
WBC: 7.3 10*3/uL (ref 4.0–10.5)
nRBC: 0 % (ref 0.0–0.2)

## 2021-04-07 NOTE — Progress Notes (Signed)
COVID Vaccine Completed:Yes Date COVID Vaccine completed: COVID vaccine manufacturer: [employer] Test: N/A Bowel prep reminder: Reviewed  PCP - Dr. Monico Blitz Cardiologist - Dr. Carlyle Dolly. Clearance: Ocie Bob.: PA :03/13/21: EPIC  Chest x-ray - 09/21/20 EKG - 09/23/20 Stress Test -  ECHO - 09/14/20 Cardiac Cath -  Pacemaker/ICD device last checked:  Sleep Study -  CPAP -   Fasting Blood Sugar -  Checks Blood Sugar _____ times a day  Blood Thinner Instructions:Eliquis: will be held 2 days before surgery as per cardiologist instructions. Aspirin Instructions: Last Dose:  Anesthesia review: Hx: HTN,PE,A flutter  Patient denies shortness of breath, fever, cough and chest pain at PAT appointment   Patient verbalized understanding of instructions that were given to them at the PAT appointment. Patient was also instructed that they will need to review over the PAT instructions again at home before surgery.

## 2021-04-08 NOTE — Progress Notes (Signed)
Anesthesia Chart Review   Case: [id number] Date/Time: 04/17/21 1545   Procedure: HEMORRHOIDECTOMY WITH LIGATION AND HEMORRHOIDOPEXY, ANORECTAL EXAMINATION UNDER ANESTHESIA   Anesthesia type: General   Pre-op diagnosis: HEMORRHOIDS PROLAPSED GRADE 4 WITH BLEEDING   Location: [facility] ROOM 02 / [facility]   Surgeons: Michael Boston, MD       DISCUSSION:66 y.o. never smoker with h/o HTN, atrial flutter (on Eliquis), CHF, PE, prolapsed grade 4 hemorrhoids scheduled for above procedure 04/17/21 with Dr. Michael Boston.   Per cardiology preoperative evaluation 03/13/2021, "Chart reviewed as part of pre-operative protocol coverage. Given past medical history and time since last visit, based on ACC/AHA guidelines, RITTER HELSLEY would be at acceptable risk for the planned procedure without further cardiovascular testing. The is doing well since last office visit. He is getting > 4 mets of activity."  Pt advised to hold Eliquis 2 days prior to procedure.   Anticipate pt can proceed with planned procedure barring acute status change.   VS: BP (!) 150/103    Pulse 80    Temp 36.8 C (Oral)    Ht 6' (1.829 m)    Wt 112.5 kg    SpO2 98%    BMI 33.63 kg/m   PROVIDERS: Monico Blitz, MD is PCP   Primary Cardiologist: Carlyle Dolly, MD  LABS: Labs reviewed: Acceptable for surgery. (all labs ordered are listed, but only abnormal results are displayed)  Labs Reviewed  CBC  BASIC METABOLIC PANEL     IMAGES:   EKG: 09/23/2020 Rate 78 bpm  NSR Incomplete RBBB  CV: Echo 09/14/2020 1. Right ventricular systolic function is severely reduced. The right  ventricular size is severely enlarged. There is moderately elevated  pulmonary artery systolic pressure. The estimated right ventricular  systolic pressure is 34.7 mmHg.   2. 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   3. Left ventricular ejection fraction, by estimation, is 45 to 50%. The  left ventricle has low  normal function. The left ventricle demonstrates  global hypokinesis. There is moderate left ventricular hypertrophy. Left  ventricular diastolic parameters are   indeterminate. There is the interventricular septum is flattened in  systole and diastole, consistent with right ventricular pressure and  volume overload.   4. Left atrial size was severely dilated.   5. Right atrial size was severely dilated.   6. The mitral valve is grossly normal. Mild mitral valve regurgitation.  No evidence of mitral stenosis.   7. The aortic valve is tricuspid. There is mild calcification of the  aortic valve. Aortic valve regurgitation is trivial. No aortic stenosis is  present.   8. Aortic dilatation noted. There is mild dilatation of the ascending  aorta, measuring 40 mm.   9. The inferior vena cava is dilated in size with <50% respiratory  variability, suggesting right atrial pressure of 15 mmHg.  10. Cannot exclude small PFO.  Past Medical History:  Diagnosis Date   Acute pulmonary embolism ([place]) 02/16/2009   Alcohol abuse    Atrial flutter ([facility])    CHF (congestive heart failure) ([place])    a. EF 40-45% in 2011 b. EF at 60-65% in 04/2015 c. EF at 45-50% by echo in 09/2020   Dysrhythmia    Hypertension    Lower extremity cellulitis    bilateral   Systolic dysfunction     Past Surgical History:  Procedure Laterality Date   COLONOSCOPY N/A 05/17/2012   Procedure: COLONOSCOPY;  Surgeon: Danie Binder, MD;  Location: [facility];  Service: Endoscopy;  Laterality: N/A;  10:30   COLONOSCOPY WITH PROPOFOL N/A 09/15/2020   Surgeon: Hurshel Keys K, DO;  2 cm rubbery textured nodular anal mass, internal hemorrhoids, external hemorrhoids, sigmoid and descending diverticula, redundant colon.  Pathology revealed polypoid rectal mucosa with acute and chronic inflammation with erosion, granular hyperplasia and macular isolation of lamina propria, consistent with mucosal prolapse.    ESOPHAGOGASTRODUODENOSCOPY (EGD) WITH PROPOFOL N/A 09/15/2020   Surgeon: Eloise Harman, DO; Small hiatal hernia, gastritis biopsied, normal examined duodenum.  Biopsy with no specific histopathologic changes, negative for H. pylori.   None     TONSILLECTOMY      MEDICATIONS:  apixaban (ELIQUIS) 5 MG TABS tablet   carvedilol (COREG) 6.25 MG tablet   ferrous sulfate 325 (65 FE) MG tablet   furosemide (LASIX) 40 MG tablet   hydrocortisone (ANUSOL-HC) 25 MG suppository   pantoprazole (PROTONIX) 40 MG tablet   sacubitril-valsartan (ENTRESTO) 24-26 MG   spironolactone (ALDACTONE) 25 MG tablet   No current facility-administered medications for this encounter.    Konrad Felix Ward, PA-C [facility] Pre-Surgical Testing [phone]

## 2021-04-17 ENCOUNTER — Observation Stay (HOSPITAL_COMMUNITY)
Admission: RE | Admit: 2021-04-17 | Discharge: 2021-04-18 | Disposition: A | Discharge status: Home / Self Care | Payer: Medicare Other | Source: Ambulatory Visit | Attending: Surgery | Admitting: Surgery

## 2021-04-17 ENCOUNTER — Encounter (HOSPITAL_COMMUNITY): Payer: Self-pay | Admitting: Surgery

## 2021-04-17 ENCOUNTER — Other Ambulatory Visit: Payer: Self-pay

## 2021-04-17 ENCOUNTER — Ambulatory Visit (HOSPITAL_COMMUNITY): Payer: Medicare Other | Admitting: Physician Assistant

## 2021-04-17 ENCOUNTER — Ambulatory Visit (HOSPITAL_BASED_OUTPATIENT_CLINIC_OR_DEPARTMENT_OTHER): Payer: Medicare Other | Admitting: Anesthesiology

## 2021-04-17 ENCOUNTER — Encounter (HOSPITAL_COMMUNITY)
Admission: RE | Disposition: A | Discharge status: Home / Self Care | Payer: Self-pay | Source: Ambulatory Visit | Attending: Surgery

## 2021-04-17 DIAGNOSIS — Z7901 Long term (current) use of anticoagulants: Secondary | ICD-10-CM | POA: Insufficient documentation

## 2021-04-17 DIAGNOSIS — I4891 Unspecified atrial fibrillation: Secondary | ICD-10-CM | POA: Insufficient documentation

## 2021-04-17 DIAGNOSIS — I10 Essential (primary) hypertension: Secondary | ICD-10-CM | POA: Diagnosis present

## 2021-04-17 DIAGNOSIS — K642 Third degree hemorrhoids: Secondary | ICD-10-CM | POA: Insufficient documentation

## 2021-04-17 DIAGNOSIS — K644 Residual hemorrhoidal skin tags: Secondary | ICD-10-CM | POA: Insufficient documentation

## 2021-04-17 DIAGNOSIS — I110 Hypertensive heart disease with heart failure: Secondary | ICD-10-CM | POA: Diagnosis not present

## 2021-04-17 DIAGNOSIS — N183 Chronic kidney disease, stage 3 unspecified: Secondary | ICD-10-CM | POA: Diagnosis not present

## 2021-04-17 DIAGNOSIS — R0902 Hypoxemia: Secondary | ICD-10-CM

## 2021-04-17 DIAGNOSIS — K643 Fourth degree hemorrhoids: Principal | ICD-10-CM | POA: Insufficient documentation

## 2021-04-17 DIAGNOSIS — Z20822 Contact with and (suspected) exposure to covid-19: Secondary | ICD-10-CM | POA: Diagnosis not present

## 2021-04-17 DIAGNOSIS — I509 Heart failure, unspecified: Secondary | ICD-10-CM | POA: Diagnosis not present

## 2021-04-17 DIAGNOSIS — K641 Second degree hemorrhoids: Secondary | ICD-10-CM | POA: Diagnosis not present

## 2021-04-17 DIAGNOSIS — I130 Hypertensive heart and chronic kidney disease with heart failure and stage 1 through stage 4 chronic kidney disease, or unspecified chronic kidney disease: Secondary | ICD-10-CM | POA: Insufficient documentation

## 2021-04-17 DIAGNOSIS — Z9889 Other specified postprocedural states: Secondary | ICD-10-CM

## 2021-04-17 DIAGNOSIS — I428 Other cardiomyopathies: Secondary | ICD-10-CM

## 2021-04-17 DIAGNOSIS — Z8719 Personal history of other diseases of the digestive system: Secondary | ICD-10-CM

## 2021-04-17 HISTORY — PX: EVALUATION UNDER ANESTHESIA WITH HEMORRHOIDECTOMY: SHX5624

## 2021-04-17 LAB — RESP PANEL BY RT-PCR (FLU A&B, COVID) ARPGX2
Influenza A by PCR: NEGATIVE
Influenza B by PCR: NEGATIVE
SARS Coronavirus 2 by RT PCR: NEGATIVE

## 2021-04-17 SURGERY — EXAM UNDER ANESTHESIA WITH HEMORRHOIDECTOMY
Anesthesia: General

## 2021-04-17 MED ORDER — OXYCODONE HCL 5 MG PO TABS: 5.0000 mg | ORAL_TABLET | Freq: Four times a day (QID) | ORAL | 0 refills | Status: DC | PRN

## 2021-04-17 MED ORDER — ACETAMINOPHEN 500 MG PO TABS
1000.0000 mg | ORAL_TABLET | Freq: Three times a day (TID) | ORAL | Status: DC
  Administered 2021-04-17 – 2021-04-18 (×2): 1000 mg via ORAL
  Filled 2021-04-17 (×2): qty 2

## 2021-04-17 MED ORDER — EPHEDRINE SULFATE-NACL 50-0.9 MG/10ML-% IV SOSY
PREFILLED_SYRINGE | INTRAVENOUS | Status: DC | PRN
  Administered 2021-04-17: 10 mg via INTRAVENOUS

## 2021-04-17 MED ORDER — BUPIVACAINE LIPOSOME 1.3 % IJ SUSP
INTRAMUSCULAR | Status: AC
  Filled 2021-04-17: qty 20

## 2021-04-17 MED ORDER — FENTANYL CITRATE (PF) 100 MCG/2ML IJ SOLN
INTRAMUSCULAR | Status: AC
  Filled 2021-04-17: qty 2

## 2021-04-17 MED ORDER — DEXMEDETOMIDINE (PRECEDEX) IN NS 20 MCG/5ML (4 MCG/ML) IV SYRINGE
PREFILLED_SYRINGE | INTRAVENOUS | Status: DC | PRN
  Administered 2021-04-17: 12 ug via INTRAVENOUS

## 2021-04-17 MED ORDER — DEXMEDETOMIDINE (PRECEDEX) IN NS 20 MCG/5ML (4 MCG/ML) IV SYRINGE
PREFILLED_SYRINGE | INTRAVENOUS | Status: AC
  Filled 2021-04-17: qty 5

## 2021-04-17 MED ORDER — SODIUM CHLORIDE 0.9 % IV SOLN
2.0000 g | INTRAVENOUS | Status: AC
  Administered 2021-04-17: 2 g via INTRAVENOUS
  Filled 2021-04-17: qty 20

## 2021-04-17 MED ORDER — CHLORHEXIDINE GLUCONATE CLOTH 2 % EX PADS: 6.0000 | MEDICATED_PAD | Freq: Once | CUTANEOUS | Status: DC

## 2021-04-17 MED ORDER — LABETALOL HCL 5 MG/ML IV SOLN: 10.0000 mg | INTRAVENOUS | Status: DC | PRN

## 2021-04-17 MED ORDER — ONDANSETRON HCL 4 MG/2ML IJ SOLN: 4.0000 mg | Freq: Once | INTRAMUSCULAR | Status: DC | PRN

## 2021-04-17 MED ORDER — ORAL CARE MOUTH RINSE: 15.0000 mL | Freq: Once | OROMUCOSAL | Status: AC

## 2021-04-17 MED ORDER — BUPIVACAINE LIPOSOME 1.3 % IJ SUSP
INTRAMUSCULAR | Status: DC | PRN
  Administered 2021-04-17: 20 mL

## 2021-04-17 MED ORDER — LIDOCAINE HCL (PF) 2 % IJ SOLN
INTRAMUSCULAR | Status: AC
  Filled 2021-04-17: qty 5

## 2021-04-17 MED ORDER — DOCUSATE SODIUM 100 MG PO CAPS
100.0000 mg | ORAL_CAPSULE | Freq: Two times a day (BID) | ORAL | Status: DC
  Administered 2021-04-17: 100 mg via ORAL
  Filled 2021-04-17: qty 1

## 2021-04-17 MED ORDER — BUPIVACAINE-EPINEPHRINE (PF) 0.25% -1:200000 IJ SOLN
INTRAMUSCULAR | Status: AC
  Filled 2021-04-17: qty 30

## 2021-04-17 MED ORDER — ENSURE PRE-SURGERY PO LIQD
296.0000 mL | Freq: Once | ORAL | Status: DC
  Filled 2021-04-17: qty 296

## 2021-04-17 MED ORDER — PANTOPRAZOLE SODIUM 40 MG PO TBEC
40.0000 mg | DELAYED_RELEASE_TABLET | Freq: Every day | ORAL | Status: DC
Start: 2021-04-18 — End: 2021-04-18
  Administered 2021-04-18: 40 mg via ORAL
  Filled 2021-04-17: qty 1

## 2021-04-17 MED ORDER — DEXAMETHASONE SODIUM PHOSPHATE 10 MG/ML IJ SOLN
INTRAMUSCULAR | Status: DC | PRN
  Administered 2021-04-17: 5 mg via INTRAVENOUS

## 2021-04-17 MED ORDER — BUPIVACAINE LIPOSOME 1.3 % IJ SUSP
20.0000 mL | Freq: Once | INTRAMUSCULAR | Status: DC
Start: 2021-04-17 — End: 2021-04-17

## 2021-04-17 MED ORDER — FENTANYL CITRATE PF 50 MCG/ML IJ SOSY
PREFILLED_SYRINGE | INTRAMUSCULAR | Status: AC
  Filled 2021-04-17: qty 3

## 2021-04-17 MED ORDER — PROPOFOL 10 MG/ML IV BOLUS
INTRAVENOUS | Status: DC | PRN
  Administered 2021-04-17: 150 mg via INTRAVENOUS

## 2021-04-17 MED ORDER — ALBUTEROL SULFATE (2.5 MG/3ML) 0.083% IN NEBU
INHALATION_SOLUTION | RESPIRATORY_TRACT | Status: AC
  Filled 2021-04-17: qty 3

## 2021-04-17 MED ORDER — FENTANYL CITRATE PF 50 MCG/ML IJ SOSY
25.0000 ug | PREFILLED_SYRINGE | INTRAMUSCULAR | Status: DC | PRN
  Administered 2021-04-17: 50 ug via INTRAVENOUS

## 2021-04-17 MED ORDER — OXYCODONE HCL 5 MG/5ML PO SOLN: 5.0000 mg | Freq: Once | ORAL | Status: DC | PRN

## 2021-04-17 MED ORDER — FENTANYL CITRATE (PF) 250 MCG/5ML IJ SOLN
INTRAMUSCULAR | Status: DC | PRN
  Administered 2021-04-17 (×2): 50 ug via INTRAVENOUS
  Administered 2021-04-17: 100 ug via INTRAVENOUS

## 2021-04-17 MED ORDER — FUROSEMIDE 40 MG PO TABS: 40.0000 mg | ORAL_TABLET | Freq: Two times a day (BID) | ORAL | Status: DC

## 2021-04-17 MED ORDER — ONDANSETRON HCL 4 MG/2ML IJ SOLN
INTRAMUSCULAR | Status: DC | PRN
  Administered 2021-04-17: 4 mg via INTRAVENOUS

## 2021-04-17 MED ORDER — ONDANSETRON 4 MG PO TBDP: 4.0000 mg | ORAL_TABLET | Freq: Four times a day (QID) | ORAL | Status: DC | PRN

## 2021-04-17 MED ORDER — LACTATED RINGERS IV SOLN: INTRAVENOUS | Status: DC

## 2021-04-17 MED ORDER — PHENYLEPHRINE 40 MCG/ML (10ML) SYRINGE FOR IV PUSH (FOR BLOOD PRESSURE SUPPORT)
PREFILLED_SYRINGE | INTRAVENOUS | Status: AC
  Filled 2021-04-17: qty 10

## 2021-04-17 MED ORDER — LABETALOL HCL 5 MG/ML IV SOLN
5.0000 mg | INTRAVENOUS | Status: AC | PRN
  Administered 2021-04-17: 5 mg via INTRAVENOUS

## 2021-04-17 MED ORDER — OXYCODONE HCL 5 MG PO TABS: 5.0000 mg | ORAL_TABLET | Freq: Once | ORAL | Status: DC | PRN

## 2021-04-17 MED ORDER — ROCURONIUM BROMIDE 10 MG/ML (PF) SYRINGE
PREFILLED_SYRINGE | INTRAVENOUS | Status: AC
  Filled 2021-04-17: qty 10

## 2021-04-17 MED ORDER — SUGAMMADEX SODIUM 500 MG/5ML IV SOLN
INTRAVENOUS | Status: AC
  Filled 2021-04-17: qty 5

## 2021-04-17 MED ORDER — ROCURONIUM BROMIDE 10 MG/ML (PF) SYRINGE
PREFILLED_SYRINGE | INTRAVENOUS | Status: DC | PRN
Start: 2021-04-17 — End: 2021-04-17
  Administered 2021-04-17: 70 mg via INTRAVENOUS

## 2021-04-17 MED ORDER — IPRATROPIUM-ALBUTEROL 0.5-2.5 (3) MG/3ML IN SOLN
3.0000 mL | Freq: Once | RESPIRATORY_TRACT | Status: DC
Start: 2021-04-17 — End: 2021-04-17
  Filled 2021-04-17: qty 3

## 2021-04-17 MED ORDER — TRAMADOL HCL 50 MG PO TABS
50.0000 mg | ORAL_TABLET | Freq: Four times a day (QID) | ORAL | Status: DC | PRN
  Filled 2021-04-17: qty 1

## 2021-04-17 MED ORDER — PROPOFOL 10 MG/ML IV BOLUS
INTRAVENOUS | Status: AC
  Filled 2021-04-17: qty 20

## 2021-04-17 MED ORDER — ONDANSETRON HCL 4 MG/2ML IJ SOLN: 4.0000 mg | Freq: Four times a day (QID) | INTRAMUSCULAR | Status: DC | PRN

## 2021-04-17 MED ORDER — LIDOCAINE 2% (20 MG/ML) 5 ML SYRINGE
INTRAMUSCULAR | Status: DC | PRN
  Administered 2021-04-17: 60 mg via INTRAVENOUS

## 2021-04-17 MED ORDER — ACETAMINOPHEN 500 MG PO TABS
1000.0000 mg | ORAL_TABLET | ORAL | Status: AC
  Administered 2021-04-17: 1000 mg via ORAL
  Filled 2021-04-17: qty 2

## 2021-04-17 MED ORDER — HEPARIN SODIUM (PORCINE) 5000 UNIT/ML IJ SOLN: 5000.0000 [IU] | Freq: Three times a day (TID) | INTRAMUSCULAR | Status: DC

## 2021-04-17 MED ORDER — BUPIVACAINE-EPINEPHRINE (PF) 0.25% -1:200000 IJ SOLN
INTRAMUSCULAR | Status: DC | PRN
  Administered 2021-04-17: 30 mL

## 2021-04-17 MED ORDER — SIMETHICONE 80 MG PO CHEW: 40.0000 mg | CHEWABLE_TABLET | Freq: Four times a day (QID) | ORAL | Status: DC | PRN

## 2021-04-17 MED ORDER — GABAPENTIN 300 MG PO CAPS
300.0000 mg | ORAL_CAPSULE | ORAL | Status: AC
  Administered 2021-04-17: 300 mg via ORAL
  Filled 2021-04-17: qty 1

## 2021-04-17 MED ORDER — CARVEDILOL 6.25 MG PO TABS
6.2500 mg | ORAL_TABLET | Freq: Two times a day (BID) | ORAL | Status: DC
  Administered 2021-04-18: 6.25 mg via ORAL
  Filled 2021-04-17: qty 1

## 2021-04-17 MED ORDER — IPRATROPIUM BROMIDE 0.02 % IN SOLN
0.5000 mg | Freq: Once | RESPIRATORY_TRACT | Status: AC
  Administered 2021-04-17: 0.5 mg via RESPIRATORY_TRACT
  Filled 2021-04-17: qty 2.5

## 2021-04-17 MED ORDER — PHENYLEPHRINE HCL-NACL 20-0.9 MG/250ML-% IV SOLN
INTRAVENOUS | Status: AC
  Filled 2021-04-17: qty 250

## 2021-04-17 MED ORDER — ONDANSETRON HCL 4 MG/2ML IJ SOLN
INTRAMUSCULAR | Status: AC
  Filled 2021-04-17: qty 2

## 2021-04-17 MED ORDER — SUGAMMADEX SODIUM 500 MG/5ML IV SOLN
INTRAVENOUS | Status: DC | PRN
  Administered 2021-04-17: 500 mg via INTRAVENOUS

## 2021-04-17 MED ORDER — LABETALOL HCL 5 MG/ML IV SOLN
INTRAVENOUS | Status: AC
  Administered 2021-04-17: 5 mg via INTRAVENOUS
  Filled 2021-04-17: qty 4

## 2021-04-17 MED ORDER — MIDAZOLAM HCL 2 MG/2ML IJ SOLN
INTRAMUSCULAR | Status: DC | PRN
Start: 2021-04-17 — End: 2021-04-17
  Administered 2021-04-17: 2 mg via INTRAVENOUS

## 2021-04-17 MED ORDER — CHLORHEXIDINE GLUCONATE 0.12 % MT SOLN
15.0000 mL | Freq: Once | OROMUCOSAL | Status: AC
  Administered 2021-04-17: 15 mL via OROMUCOSAL

## 2021-04-17 MED ORDER — PHENYLEPHRINE 40 MCG/ML (10ML) SYRINGE FOR IV PUSH (FOR BLOOD PRESSURE SUPPORT)
PREFILLED_SYRINGE | INTRAVENOUS | Status: DC | PRN
  Administered 2021-04-17 (×2): 80 ug via INTRAVENOUS
  Administered 2021-04-17: 40 ug via INTRAVENOUS

## 2021-04-17 MED ORDER — MORPHINE SULFATE (PF) 2 MG/ML IV SOLN: 1.0000 mg | INTRAVENOUS | Status: DC | PRN

## 2021-04-17 MED ORDER — SPIRONOLACTONE 25 MG PO TABS
25.0000 mg | ORAL_TABLET | Freq: Every day | ORAL | Status: DC
  Administered 2021-04-18: 25 mg via ORAL
  Filled 2021-04-17: qty 1

## 2021-04-17 MED ORDER — DIBUCAINE (PERIANAL) 1 % EX OINT
TOPICAL_OINTMENT | CUTANEOUS | Status: DC | PRN
  Administered 2021-04-17: 1 via RECTAL

## 2021-04-17 MED ORDER — ALBUTEROL SULFATE (2.5 MG/3ML) 0.083% IN NEBU
2.5000 mg | INHALATION_SOLUTION | Freq: Once | RESPIRATORY_TRACT | Status: AC
  Administered 2021-04-17: 2.5 mg via RESPIRATORY_TRACT

## 2021-04-17 MED ORDER — DIAZEPAM 5 MG PO TABS: 5.0000 mg | ORAL_TABLET | Freq: Three times a day (TID) | ORAL | 1 refills | Status: DC | PRN

## 2021-04-17 MED ORDER — DEXAMETHASONE SODIUM PHOSPHATE 10 MG/ML IJ SOLN
INTRAMUSCULAR | Status: AC
  Filled 2021-04-17: qty 1

## 2021-04-17 MED ORDER — SACUBITRIL-VALSARTAN 24-26 MG PO TABS
1.0000 | ORAL_TABLET | Freq: Two times a day (BID) | ORAL | Status: DC
  Administered 2021-04-18: 1 via ORAL
  Filled 2021-04-17: qty 1

## 2021-04-17 MED ORDER — EPHEDRINE 5 MG/ML INJ
INTRAVENOUS | Status: AC
  Filled 2021-04-17: qty 5

## 2021-04-17 MED ORDER — DIBUCAINE (PERIANAL) 1 % EX OINT
TOPICAL_OINTMENT | CUTANEOUS | Status: AC
  Filled 2021-04-17: qty 28

## 2021-04-17 MED ORDER — MIDAZOLAM HCL 2 MG/2ML IJ SOLN
INTRAMUSCULAR | Status: AC
  Filled 2021-04-17: qty 2

## 2021-04-17 SURGICAL SUPPLY — 32 items
BAG COUNTER SPONGE SURGICOUNT (BAG) IMPLANT
BENZOIN TINCTURE PRP APPL 2/3 (GAUZE/BANDAGES/DRESSINGS) ×2 IMPLANT
BLADE SURG 15 STRL LF DISP TIS (BLADE) IMPLANT
BLADE SURG 15 STRL SS (BLADE)
CNTNR URN SCR LID CUP LEK RST (MISCELLANEOUS) ×1 IMPLANT
CONT SPEC 4OZ STRL OR WHT (MISCELLANEOUS) ×1
COVER SURGICAL LIGHT HANDLE (MISCELLANEOUS) ×2 IMPLANT
DRAPE LAPAROTOMY T 102X78X121 (DRAPES) ×2 IMPLANT
DRSG PAD ABDOMINAL 8X10 ST (GAUZE/BANDAGES/DRESSINGS) ×1 IMPLANT
ELECT REM PT RETURN 15FT ADLT (MISCELLANEOUS) ×2 IMPLANT
GAUZE 4X4 16PLY ~~LOC~~+RFID DBL (SPONGE) ×2 IMPLANT
GAUZE SPONGE 4X4 12PLY STRL (GAUZE/BANDAGES/DRESSINGS) ×1 IMPLANT
GLOVE SURG NEOPR MICRO LF SZ8 (GLOVE) ×2 IMPLANT
GLOVE SURG UNDER LTX SZ8 (GLOVE) ×2 IMPLANT
GOWN STRL REUS W/TWL XL LVL3 (GOWN DISPOSABLE) ×4 IMPLANT
KIT BASIN OR (CUSTOM PROCEDURE TRAY) ×2 IMPLANT
KIT TURNOVER KIT A (KITS) IMPLANT
LOOP VESSEL MAXI BLUE (MISCELLANEOUS) IMPLANT
NEEDLE HYPO 22GX1.5 SAFETY (NEEDLE) ×2 IMPLANT
PACK BASIC VI WITH GOWN DISP (CUSTOM PROCEDURE TRAY) ×2 IMPLANT
PANTS MESH DISP LRG (UNDERPADS AND DIAPERS) ×1 IMPLANT
PANTS MESH DISPOSABLE L (UNDERPADS AND DIAPERS) ×1
PENCIL SMOKE EVACUATOR (MISCELLANEOUS) ×1 IMPLANT
SHEARS HARMONIC 9CM CVD (BLADE) IMPLANT
SPIKE FLUID TRANSFER (MISCELLANEOUS) ×2 IMPLANT
SURGILUBE 2OZ TUBE FLIPTOP (MISCELLANEOUS) ×2 IMPLANT
SUT CHROMIC 2 0 SH (SUTURE) ×5 IMPLANT
SUT VIC AB 2-0 UR6 27 (SUTURE) ×12 IMPLANT
SYR 20ML LL LF (SYRINGE) ×2 IMPLANT
SYR 3ML LL SCALE MARK (SYRINGE) IMPLANT
TOWEL OR 17X26 10 PK STRL BLUE (TOWEL DISPOSABLE) ×2 IMPLANT
TOWEL OR NON WOVEN STRL DISP B (DISPOSABLE) ×2 IMPLANT

## 2021-04-17 NOTE — Anesthesia Postprocedure Evaluation (Addendum)
Anesthesia Post Note ? ?Patient: Dennis Swanson ? ?Procedure(s) Performed: HEMORRHOIDECTOMY WITH LIGATION AND HEMORRHOIDOPEXY, ANORECTAL EXAMINATION UNDER ANESTHESIA, EXCISION OF PROLAPSE ANAL MASS, EXCISON OF GRADE 3 AND 4 PROLAPSING HEMORRHOIDS ? ?  ? ?Patient location during evaluation: PACU ?Anesthesia Type: General ?Level of consciousness: awake and alert and oriented ?Pain management: pain level controlled ?Vital Signs Assessment: post-procedure vital signs reviewed and stable ?Respiratory status: spontaneous breathing, nonlabored ventilation, respiratory function stable, patient connected to nasal cannula oxygen and respiratory function unstable ?Cardiovascular status: blood pressure returned to baseline and stable ?Postop Assessment: no apparent nausea or vomiting ?Anesthetic complications: no ?Comments: Patient unable to urinate after procedure. Also unable to maintain SpO2 on RA. Patient will be admitted for further treatment. ? ? ?No notable events documented. ? ?Last Vitals:  ?Vitals:  ? 04/17/21 1715 04/17/21 1730  ?BP: (!) 155/105 (!) 153/105  ?Pulse: 88 78  ?Resp: 12 18  ?Temp:    ?SpO2: 91% 94%  ?  ?Last Pain:  ?Vitals:  ? 04/17/21 1725  ?TempSrc:   ?PainSc: (P) 4   ? ? ?  ?  ?  ?  ?  ?  ? ?Kimberlynn Lumbra A. ? ? ? ? ?

## 2021-04-17 NOTE — Plan of Care (Signed)
?  Problem: Activity: ?Goal: Risk for activity intolerance will decrease ?Outcome: Progressing ?  ?Problem: Elimination: ?Goal: Will not experience complications related to bowel motility ?Outcome: Progressing ?Goal: Will not experience complications related to urinary retention ?Outcome: Progressing ?  ?Problem: Safety: ?Goal: Ability to remain free from injury will improve ?Outcome: Progressing ?  ?Problem: Clinical Measurements: ?Goal: Respiratory complications will improve ?Outcome: Not Progressing ?  ?

## 2021-04-17 NOTE — Progress Notes (Addendum)
Recovery room called stating that the patient was still requiring oxygen to maintain his sat above 92%.  Otherwise he had his baseline hypertension.  They have been working with him for 3 hours and the patient was doing well with incentive spirometer but when they tried to discontinue the oxygen he desatted to around 78% with a good waveform.  Decided to admit the patient for overnight observation to continue to work on pulmonary toilet and continue to wean oxygen given the patient's comorbidity history. Pt has received minimal IVF.  ? ?Leighton Ruff. Redmond Pulling, MD, FACS ?General, Bariatric, & Minimally Invasive Surgery ?[facility], [place] ? ?

## 2021-04-17 NOTE — Anesthesia Procedure Notes (Signed)
Procedure Name: Intubation ?Date/Time: 04/17/2021 2:50 PM ?Performed by: Lollie Sails, CRNA ?Pre-anesthesia Checklist: Patient identified, Emergency Drugs available, Suction available, Patient being monitored and Timeout performed ?Patient Re-evaluated:Patient Re-evaluated prior to induction ?Oxygen Delivery Method: Circle system utilized ?Preoxygenation: Pre-oxygenation with 100% oxygen ?Induction Type: IV induction ?Ventilation: Mask ventilation without difficulty ?Laryngoscope Size: Sabra Heck and 3 ?Grade View: Grade I ?Tube type: Oral ?Tube size: 8.0 mm ?Number of attempts: 1 ?Airway Equipment and Method: Stylet ?Placement Confirmation: ETT inserted through vocal cords under direct vision, positive ETCO2 and breath sounds checked- equal and bilateral ?Secured at: 23 cm ?Tube secured with: Tape ?Dental Injury: Teeth and Oropharynx as per pre-operative assessment  ? ? ? ? ?

## 2021-04-17 NOTE — Transfer of Care (Signed)
Immediate Anesthesia Transfer of Care Note ? ?Patient: Dennis Swanson ? ?Procedure(s) Performed: HEMORRHOIDECTOMY WITH LIGATION AND HEMORRHOIDOPEXY, ANORECTAL EXAMINATION UNDER ANESTHESIA, EXCISION OF PROLAPSE ANAL MASS, EXCISON OF GRADE 3 AND 4 PROLAPSING HEMORRHOIDS ? ?Patient Location: PACU ? ?Anesthesia Type:General ? ?Level of Consciousness: drowsy ? ?Airway & Oxygen Therapy: Patient Spontanous Breathing and Patient connected to face mask ? ?Post-op Assessment: Report given to RN and Post -op Vital signs reviewed and stable ? ?Post vital signs: Reviewed and stable ? ?Last Vitals:  ?Vitals Value Taken Time  ?BP 149/93 04/17/21 1635  ?Temp    ?Pulse 89 04/17/21 1637  ?Resp 17 04/17/21 1637  ?SpO2 95 % 04/17/21 1637  ?Vitals shown include unvalidated device data. ? ?Last Pain:  ?Vitals:  ? 04/17/21 1324  ?TempSrc:   ?PainSc: 0-No pain  ?   ? ?  ? ?Complications: No notable events documented. ?

## 2021-04-17 NOTE — Addendum Note (Signed)
Addendum  created 04/17/21 1941 by Josephine Igo, MD  ? Clinical Note Signed  ?  ?

## 2021-04-17 NOTE — H&P (Signed)
04/17/2021     REFERRING PHYSICIAN: Hurshel Keys, DO  Patient Care Team: Monico Blitz, MD as PCP - General (Internal Medicine) Harl Bowie, Alphonse Guild, MD as PCP - Cardiology (Cardiology) Danie Binder, MD (Inactive) as Attending Physician (Gastroenterology) Michael Boston, MD as Consulting Physician (General Surgery) Eloise Harman, DO as Consulting Physician (Gastroenterology) Harl Bowie Alphonse Guild, MD as Consulting Physician (Cardiology)   PROVIDER: Hollace Kinnier, MD  [facility] MRN: [id number] DOB: 06/13/1955  SUBJECTIVE   Chief Complaint: rectal mass   History of Present Illness: Dennis Swanson is a 66 y.o. male who is seen today  as an office consultation at the request of Dr. Abbey Chatters  for evaluation of rectal mass .   Patient with chronic congestive heart failure. Chronically anticoagulated for atrial fibrillation -apixaban/Eliquis. Followed by cardiology.. Usually gets his care [facility] in [facility] since he lives in [place]. An episode of anemia and was admitted. Underwent upper and lower endoscopy by Dr. Eulas Post. Prolapsing mass noted at the anus. Biopsies not consistent with polyp. 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 Surgical consultation requested in August. He no showed for his last appointment in November. I am seeing him now.  Patient notes he usually moves his bowels once a day. Occasional straining. He denies any major bleeding episodes anymore. However he often has to put a water toilet paper since he feels some moisture and a lump back there. He has never had any prior anorectal interventions. He denies any history of Crohn's or ulcer colitis or inflammatory bowel disease. He claims he can walk 20 minutes without difficulty. He does not smoke. Nondiabetic. No sleep apnea. He denies any history of any abdominal surgery.  Medical History:  Past Medical History:  Diagnosis Date   Arthritis   There is no problem list on file for this patient.  History  reviewed. No pertinent surgical history.   No Known Allergies  Current Outpatient Medications on File Prior to Visit  Medication Sig Dispense Refill   carvediloL (COREG) 6.25 MG tablet TAKE 1 TABLET BY MOUTH TWICE DAILY WITH A MEAL   ELIQUIS 5 mg tablet Take 5 mg by mouth 2 (two) times daily   ENTRESTO 24-26 mg tablet Take 1 tablet by mouth 2 (two) times daily   ferrous sulfate 325 (65 FE) MG tablet Take 325 mg by mouth daily with breakfast   FUROsemide (LASIX) 40 MG tablet Take 40 mg by mouth 2 (two) times daily   hydrocortisone (ANUSOL-HC) 25 mg suppository Place 25 mg rectally 2 (two) times daily   pantoprazole (PROTONIX) 40 MG DR tablet Take 40 mg by mouth once daily   spironolactone (ALDACTONE) 25 MG tablet Take 25 mg by mouth once daily   No current facility-administered medications on file prior to visit.   History reviewed. No pertinent family history.   Social History   Tobacco Use  Smoking Status Never  Smokeless Tobacco Never    Social History   Socioeconomic History   Marital status: Single  Tobacco Use   Smoking status: Never   Smokeless tobacco: Never  Substance and Sexual Activity   Alcohol use: Not Currently   Drug use: Not Currently   ############################################################  Review of Systems: A complete review of systems (ROS) was obtained from the patient. I have reviewed this information and discussed as appropriate with the patient. See HPI as well for other pertinent ROS.  Constitutional: No fevers, chills, sweats. Weight stable Eyes: No vision changes, No discharge HENT: No sore throats, nasal  drainage Lymph: No neck swelling, No bruising easily Pulmonary: No cough, productive sputum CV: No orthopnea, PND Patient walks 20 minutes for about 1/2 miles without difficulty. No exertional chest/neck/shoulder/arm pain.  GI: No personal nor family history of GI/colon cancer, inflammatory bowel disease, irritable bowel syndrome,  allergy such as Celiac Sprue, dietary/dairy problems, colitis, ulcers nor gastritis. No recent sick contacts/gastroenteritis. No travel outside the country. No changes in diet.  Renal: No UTIs, No hematuria Genital: No drainage, bleeding, masses Musculoskeletal: No severe joint pain. Good ROM major joints Skin: No sores or lesions Heme/Lymph: No easy bleeding. No swollen lymph nodes  OBJECTIVE   Vitals:  03/11/21 1053  BP: 128/82  Weight: (!) 111.9 kg (246 lb 12.8 oz)  Height: 182.9 cm (6')  Body mass index is 33.47 kg/m.  BP (!) 167/101    Pulse 76    Temp (!) 97.4 F (36.3 C) (Oral)    Resp 16    Ht 6' (1.829 m)    Wt 112.5 kg    SpO2 98%    BMI 33.64 kg/m  04/17/2021   PHYSICAL EXAM:  Constitutional: Not cachectic. Hygeine adequate. Vitals signs as above.  Eyes: Pupils reactive, normal extraocular movements. Sclera nonicteric Neuro: CN II-XII intact. No major focal sensory defects. No major motor deficits. Lymph: No head/neck/groin lymphadenopathy Psych: No severe agitation. No severe anxiety. Judgment & insight Adequate, Oriented x4, insight fair HENT: Normocephalic, Mucus membranes moist. No thrush. Hearing: adequate Neck: Supple, No tracheal deviation. No obvious thyromegaly Chest: No pain to chest wall compression. Good respiratory excursion. No audible wheezing CV: Pulses intact. Regular rhythm. No major extremity edema Ext: No obvious deformity or contracture. Edema: Not present. No cyanosis Skin: No major subcutaneous nodules. Warm and dry Musculoskeletal: Severe joint rigidity not present. No obvious clubbing. No digital petechiae. Mobility: no assist device moving easily without restrictions  Abdomen:  Obese  Soft. Nondistended. Nontender. Hernia: Not present. Diastasis recti: Mild supraumbilical midline. No hepatomegaly. No splenomegaly.  Genital/Pelvic: Inguinal hernia: Right inguinal hernia up in his groin. Not going down to the scrotum.. Reducible and soft.  Nontender. Small hernia on the left side with cough. Inguinal lymph nodes: without lymphadenopathy nor hidradenitis.   Rectal:   ##################################  Perianal skin Mild fecal soiling  Pruritis ani: Mild Pilonidal disease: Not present Condyloma / warts: Not present  Anal fissure: Not present Perirectal abscess/fistula Not present External hemorrhoids chronically prolapsed pedunculated pink-purple mass consistent with atypical hemorrhoid versus adenomatous polyp. Negative biopsies argues against that. Can reduce in with pressure but pops right back out. Best guess left lateral although he is quite sensitive. Right anterior external hemorrhoid tag.  Digital rectal exam Barely tolerated . Too sensitive to try anoscopy Sphincter tone Normal  Hemorrhoidal piles not able to see with a anoscopy due to sensitivity Prostate: N/A Rectal masses:   Other significant findings: N/A  Patient examined with patient in decubitus position .  ###################################    ###################################################################  Labs, Imaging and Diagnostic Testing:  Located in [place]' section of Epic EMR chart  PRIOR [facility] CLINIC NOTES:  Not applicable  SURGERY NOTES:  Not applicable  PATHOLOGY:  Located in [place]' section of Epic EMR chart  FINAL MICROSCOPIC DIAGNOSIS:   A. STOMACH, BIOPSY:  - Gastric antral and oxyntic mucosa with no specific histopathologic  changes  - Warthin Starry stain is negative for Helicobacter pylori   B. ANUS, MASS, BIOPSY:  - Polypoid rectal mucosa showing acute and chronic inflammation with  erosions,  glandular hyperplasia and muscular isolation of lamina  propria, consistent with mucosal prolapse (solitary rectal ulcer)  - Negative for dysplasia or malignancy   Rafaela Dinius DESCRIPTION:   Specimen A: Received in formalin are tan, soft tissue fragments that are  submitted in toto. Number: 2.  Size:  0.2 and 0.7 cm.  Blocks: 1   Specimen B: Received in formalin is a 0.8 cm tan polyp and 4 tan  polypoid tissues ranging from 0.3 to 0.4 cm.  The polyp is bisected.  Total 1 block.   SW 09/16/2020   Final Diagnosis performed by Jaquita Folds, MD.   Electronically  signed 09/17/2020  Technical component performed at [facility]  [address], [place], [place] [address].   Professional component performed at [facility],  [place] [address] [place], [place] [address].   Immunohistochemistry Technical component (if applicable) was performed  at [facility]. [address], [place],  [place], [place] [address].   IMMUNOHISTOCHEMISTRY DISCLAIMER (if applicable):  Some of these immunohistochemical stains may have been developed and the  performance characteristics determine by [facility]. Some  may not have been cleared or approved by the U.S. Food and Drug  Administration. The FDA has determined that such clearance or approval  is not necessary. This test is used for clinical purposes. It should not  be regarded as investigational or for research. This laboratory is  certified under the [facility]  (CLIA-88) as qualified to perform high complexity clinical laboratory  testing.  The controls stained appropriately.   Assessment and Plan:  DIAGNOSES:  Diagnoses and all orders for this visit:  Mass of anus  Iron deficiency anemia due to chronic blood loss  Chronic combined systolic and diastolic CHF (congestive heart failure) (CMS-HCC)  Chronic anticoagulation  Atrial fibrillation, chronic (CMS-HCC)  Non-recurrent bilateral inguinal hernia without obstruction or gangrene    ASSESSMENT/PLAN  Pleasant patient with many things going on.  He has a chronically prolapsed anal mass. Biopsy negative for adenomatous polyp. Favor atypical hemorrhoid since it seems soft and not  consistent with anal cancer. No other source of GI bleeding and needing chronic anticoagulation with his atrial fibrillation.  I think he would benefit from outpatient surgery. Hemorrhoid ligation & pexy and hemorrhoidectomy. Do decent margins to make sure this is not actually an adenomatous polyp. He agrees.   The anatomy & physiology of the anorectal region was discussed. The pathophysiology of hemorrhoids and differential diagnosis was discussed. Natural history risks without surgery was discussed. I stressed the importance of a bowel regimen to have daily soft bowel movements to minimize progression of disease. Interventions such as sclerotherapy & banding were discussed.  The patient's symptoms are not adequately controlled by medicines and other non-operative treatments. I feel the risks & problems of no surgery outweigh the operative risks; therefore, I recommended surgery to treat the hemorrhoids by ligation, pexy, and possible resection.  Risks such as bleeding, infection, urinary difficulties, injury to other organs, need for repair of tissues / organs, need for further treatment, heart attack, death, and other risks were discussed. I noted a good likelihood this will help address the problem. Goals of post-operative recovery were discussed as well. Possibility that this will not correct all symptoms was explained. Post-operative pain, bleeding, constipation, and other problems after surgery were discussed. We will work to minimize complications. Educational handouts further explaining the pathology, treatment options, and bowel regimen were given as well.  Questions were answered. The patient expresses understanding & wishes to proceed with surgery.   Patient also has an obvious right inguinal hernia and a contralateral left inguinal hernia. Had a CAT scan December showing bladder going into it. Patient notes it does get larger and is occasionally bothersome. I think he would benefit from  surgery to get this repaired as well. However I would not do the same time as the hemorrhoid surgery. Since the bleeding mass is more of a concern, we do the anorectal hemorrhoid/anal mass excision make sure he recovers with that and then plan laparoscopic bilateral inguinal hernia repairs 3 months later if he gets through that okay. He wishes to proceed with that as well.  The anatomy & physiology of the abdominal wall and pelvic floor was discussed. The pathophysiology of hernias in the inguinal and pelvic region was discussed. Natural history risks such as progressive enlargement, pain, incarceration, and strangulation was discussed. Contributors to complications such as smoking, obesity, diabetes, prior surgery, etc were discussed.   I feel the risks of no intervention will lead to serious problems that outweigh the operative risks; therefore, I recommended surgery to reduce and repair the hernia. I explained laparoscopic techniques with possible need for an open approach. I noted usual use of mesh to patch and/or buttress hernia repair  Risks such as bleeding, infection, abscess, need for further treatment, injury to other organs, need for repair of tissues / organs, stroke, heart attack, death, and other risks were discussed. I noted a good likelihood this will help address the problem. Goals of post-operative recovery were discussed as well. Possibility that this will not correct all symptoms was explained. I stressed the importance of low-impact activity, aggressive pain control, avoiding constipation, & not pushing through pain to minimize risk of post-operative chronic pain or injury. Possibility of reherniation was discussed. We will work to minimize complications.   An educational handout further explaining the pathology & treatment options was given as well. Questions were answered. The patient expresses understanding & wishes to proceed with surgery.  He is chronically on Eliquis for atrial  fibrillation. His performance status seems somewhat decent. Would like to double check with cardiology (Dr. Harl Bowie) that they do not feel that there is anything else need to be done from a cardiac clearance standpoint aside from holding his Eliquis perioperatively. Usually we hold off for 2 days preop and then resume Eliquis around postop day 1 or 2 if things go well.  He did have anemia with hemoglobin in the 7s. That has not been rechecked. He is feeling better overall. Repeat 14.2 Hgb reassuring.    Adin Hector, MD, FACS, MASCRS Esophageal, Gastrointestinal & Colorectal Surgery Robotic and Minimally Invasive Surgery  [facility], [place]  [place]. [address] [place], [place] [address] [phone] Fax [phone] Main  CONTACT INFORMATION:  Weekday (9AM-5PM): Call [facility] main office at [phone]  Weeknight (5PM-9AM) or Weekend/Holiday: Check [email or web address] (password " [email or web address]") for General Surgery [facility] coverage  (Please, do not use SecureChat as it is not reliable communication to operating surgeons for immediate patient care)    04/17/2021

## 2021-04-17 NOTE — Anesthesia Preprocedure Evaluation (Signed)
Anesthesia Evaluation  ?Patient identified by MRN, date of birth, ID band ?Patient awake ? ? ? ?Reviewed: ?Allergy & Precautions, NPO status , Patient's Chart, lab work & pertinent test results, reviewed documented beta blocker date and time  ? ?Airway ?Mallampati: I ? ?TM Distance: >3 FB ?Neck ROM: Full ? ? ? Dental ? ?(+) Missing, Dental Advisory Given ?  ?Pulmonary ?neg pulmonary ROS,  ?  ?Pulmonary exam normal ?breath sounds clear to auscultation ? ? ? ? ? ? Cardiovascular ?hypertension, Pt. on medications ?+CHF  ?Normal cardiovascular exam+ dysrhythmias Atrial Fibrillation  ?Rhythm:Regular Rate:Normal ? ?EKG 09/23/20 ?NSR, incomplete RBBB pattern ? ?Echo 09/14/20 ?1. Right ventricular systolic function is severely reduced. The right  ?ventricular size is severely enlarged. There is moderately elevated  ?pulmonary artery systolic pressure. The estimated right ventricular  ?systolic pressure is 53.9 mmHg.  ??2. Tricuspid valve regurgitation is severe and secondary to annular  ?dilation.  ??3. Left ventricular ejection fraction, by estimation, is 45 to 50%. The  ?left ventricle has low normal function. The left ventricle demonstrates  ?global hypokinesis. There is moderate left ventricular hypertrophy. Left  ?ventricular diastolic parameters are  ??indeterminate. There is the interventricular septum is flattened in  ?systole and diastole, consistent with right ventricular pressure and  ?volume overload.  ??4. Left atrial size was severely dilated.  ??5. Right atrial size was severely dilated.  ??6. The mitral valve is grossly normal. Mild mitral valve regurgitation.  ?No evidence of mitral stenosis.  ??7. The aortic valve is tricuspid. There is mild calcification of the  ?aortic valve. Aortic valve regurgitation is trivial. No aortic stenosis is  ?present.  ??8. Aortic dilatation noted. 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  ??9. The inferior vena cava  is dilated in size with <50% respiratory  ?variability, suggesting right atrial pressure of 15 mmHg.  ?10. Cannot exclude small PFO.  ?  ?Neuro/Psych ?negative neurological ROS ? negative psych ROS  ? GI/Hepatic ?Neg liver ROS, Hemorrhoids ?  ?Endo/Other  ?Morbid obesity ? Renal/GU ?Renal InsufficiencyRenal disease  ?negative genitourinary ?  ?Musculoskeletal ? ?(+) Arthritis ,  ? Abdominal ?(+) + obese,   ?Peds ? Hematology ? ?(+) Blood dyscrasia, anemia , Eliquis therapy- last dose 2 weeks ago   ?Anesthesia Other Findings ? ? Reproductive/Obstetrics ? ?  ? ? ? ? ? ? ? ? ? ? ? ? ? ?  ?  ? ? ? ? ? ? ? ? ?Anesthesia Physical ?Anesthesia Plan ? ?ASA: 3 ? ?Anesthesia Plan: General  ? ?Post-op Pain Management: Minimal or no pain anticipated  ? ?Induction: Intravenous ? ?PONV Risk Score and Plan: 4 or greater and Treatment may vary due to age or medical condition ? ?Airway Management Planned: Oral ETT ? ?Additional Equipment: None ? ?Intra-op Plan:  ? ?Post-operative Plan: Extubation in OR ? ?Informed Consent: I have reviewed the patients History and Physical, chart, labs and discussed the procedure including the risks, benefits and alternatives for the proposed anesthesia with the patient or authorized representative who has indicated his/her understanding and acceptance.  ? ? ? ?Dental advisory given ? ?Plan Discussed with: CRNA and Anesthesiologist ? ?Anesthesia Plan Comments:   ? ? ? ? ? ? ?Anesthesia Quick Evaluation ? ?

## 2021-04-17 NOTE — Op Note (Signed)
04/17/2021 ? ?4:26 PM ? ?PATIENT:  Dennis Swanson  66 y.o. male ? ?Patient Care Team: ?Monico Blitz, MD as PCP - General (Internal Medicine) ?Arnoldo Lenis, MD as PCP - Cardiology (Cardiology) ?Fields, Marga Melnick, MD (Inactive) as Attending Physician (Gastroenterology) ?Michael Boston, MD as Consulting Physician (General Surgery) ?Eloise Harman, DO as Consulting Physician (Gastroenterology) ?Arnoldo Lenis, MD as Consulting Physician (Cardiology) ? ?PRE-OPERATIVE DIAGNOSIS:  HEMORRHOIDS PROLAPSED GRADE 4 WITH BLEEDING ? ?POST-OPERATIVE DIAGNOSIS:   ?PROLAPSING ANORECTAL MASS ?HEMORRHOIDS PROLAPSED GRADE 3 & 4 WITH BLEEDING ? ?PROCEDURE:   ?Excision of prolapsing anorectal mass ?Internal and external hemorrhoidectomy x1 ?Internal hemorrhoidectomy x1 ?Internal hemorrhoidal ligation and pexy ?Anorectal examination under anesthesia ? ?SURGEON:  Adin Hector, MD ? ?ANESTHESIA:  ? ?General ?Anorectal & Local field block (0.25% bupivacaine with epinephrine mixed with Liposomal bupivacaine (Experel)  ? ?EBL:  Total I/O ?In: 1100 [I.V.:1000; IV Piggyback:100] ?Out: 50 [Blood:50].  See operative record ? ?Delay start of Pharmacological VTE agent (>24hrs) due to surgical blood loss or risk of bleeding:  NO ? ?DRAINS: NONE ? ?SPECIMEN:   ? ?Left posterior prolapsing anal canal mass.  Possible large grade 4 hemorrhoid versus atypical adenoma. ?Internal & external hemorrhoid x1 ?Internal hemorrhoid x1 ? ?DISPOSITION OF SPECIMEN:  PATHOLOGY ? ?COUNTS:  YES ? ?PLAN OF CARE: Discharge home after PACU ? ?PATIENT DISPOSITION:  PACU - hemodynamically stable. ? ?INDICATION: Pleasant patient with struggles with hemorrhoids.  Chronically anticoagulated and had severe bleeding anemia.  Found to have prolapsing hemorrhoids and a large left posterior prolapsing mass that is most likely an irritated hemorrhoid but some atypical mucosal changes that are concerning not able to be managed in the office despite an improved bowel  regimen.  I recommended examination under anesthesia and surgical treatment: ? ?The anatomy & physiology of the anorectal region was discussed.  The pathophysiology of hemorrhoids and differential diagnosis was discussed.  Natural history risks without surgery was discussed.   I stressed the importance of a bowel regimen to have daily soft bowel movements to minimize progression of disease.  Interventions such as sclerotherapy & banding were discussed. ? ?The patient's symptoms are not adequately controlled by medicines and other non-operative treatments.  I feel the risks & problems of no surgery outweigh the operative risks; therefore, I recommended surgery to treat the hemorrhoids by ligation, pexy, and possible resection. ? ?Risks such as bleeding, infection, need for further treatment, heart attack, death, and other risks were discussed.   I noted a good likelihood this will help address the problem.  Goals of post-operative recovery were discussed as well.  Possibility that this will not correct all symptoms was explained.  Post-operative pain, bleeding, constipation, urinary difficulties, and other problems after surgery were discussed.  We will work to minimize complications.   Educational handouts further explaining the pathology, treatment options, and bowel regimen were given as well.  Questions were answered.  The patient expresses understanding & wishes to proceed with surgery. ? ?OR FINDINGS: Large left posterior chronically prolapsed anorectal mass.  Mucosal changes on it that could be suspicious for adenomatous changes versus large chronically prolapsed hemorrhoid.  Ligation/pexy/excision done. ? ?Large right anteriolateral posterior internal/external hemorrhoid at least grade 3, probably grade 4.  Ligation/pexy/hemorrhoidectomy done.  Inflamed grade 2 right anterior internal hemorrhoid.  Internal partial hemorrhoidectomy done. ? ?No fissure.  No fistula.  No abscess.  No condyloma.  No true  circumferential rectal prolapse.  No pilonidal disease.  Moderate perianal pruritus  consistent with chronic prolapsing tissue ? ?DESCRIPTION:  ? ?Informed consent was confirmed. Patient underwent general anesthesia without difficulty. Patient was placed into  prone/jacknife positioning.  The perianal region was prepped and draped in sterile fashion. Surgical time-out confirmed our plan. ? ?I did digital rectal examination and then transitioned over to anoscopy to get a sense of the anatomy.  Findings noted above.  ? ?I proceeded to do hemorrhoidal ligation and pexy over a large Parks retractor alternating with occasional large Paediatric nurse.  I used a 2-0 Vicryl suture on a UR-6 needle in a figure-of-eight fashion 6 cm proximal to the anal verge.  I started at the largest prolapsing mass left posterior.  Because of redundant hemorrhoidal tissue too bulky to merely ligate or pexy, I excised the prolapsing anorectal mass as if it was a giant hemorrhoid longitudinally in a fusiform biconcave fashion, taking care to remove any suspicious adenomatous changes while sparing the anal canal to avoid narrowing.  I then ran that stitch longitudinally more distally to close the hemorrhoidectomy wound to the anal verge over a large Parks self-retaining anal retractor to avoid narrowing of the anal canal.  I then tied that stitch down to cause a hemorrhoidopexy.   I also had to do an excision at the  right anterior and right posterior pile locations.  I then did hemorrhoidal ligation and pexy at the other 3 hemorrhoidal columns.  At the completion of this, all 6 anorectal columns were ligated and pexied in the classic hexagonal fashion (right anterior/lateral/posterior, left anterior/lateral/posterior).   ? ?I excised radially and closed the external part of the hemorrhoidectomy wounds with radial interrupted horizontal mattress 2-0 Vicryl and chromic suture over a large Sawyer anal retractor, leaving the last 5 mm open to  allow natural drainage.   ? ?I redid anoscopy & examination.  At completion of this, all hemorrhoids had been removed or reduced into the rectum.  There is no more prolapse.  Internal & external anatomy was more more normal.  Hemostasis was good.  Fluffed gauze was on-laid over the perianal region.  No packing done.  Patient is being extubated go to go to the recovery room. ? ?I had discussed postop care in detail with the patient in the preop holding area.  Instructions for post-operative recovery and prescriptions are written. I discussed operative findings, updated the patient's status, discussed probable steps to recovery, and gave postoperative recommendations to the  patient's niece .  Recommendations were made.  Questions were answered.  He expressed understanding & appreciation. ? ?Adin Hector, M.D., F.A.C.S. ?Gastrointestinal and Minimally Invasive Surgery ?[facility] ?[address] ?[place], [place] [address] ?([phone] Main / Paging ? ? ? ? ? ?

## 2021-04-18 ENCOUNTER — Encounter (HOSPITAL_COMMUNITY): Payer: Self-pay | Admitting: Surgery

## 2021-04-18 DIAGNOSIS — R0902 Hypoxemia: Secondary | ICD-10-CM

## 2021-04-18 DIAGNOSIS — K643 Fourth degree hemorrhoids: Secondary | ICD-10-CM | POA: Diagnosis not present

## 2021-04-18 LAB — BASIC METABOLIC PANEL
Anion gap: 6 (ref 5–15)
BUN: 14 mg/dL (ref 8–23)
CO2: 32 mmol/L (ref 22–32)
Calcium: 8.6 mg/dL — ABNORMAL LOW (ref 8.9–10.3)
Chloride: 98 mmol/L (ref 98–111)
Creatinine, Ser: 1.07 mg/dL (ref 0.61–1.24)
GFR, Estimated: >60 mL/min (ref >60)
Glucose, Bld: 149 mg/dL — ABNORMAL HIGH (ref 70–99)
Potassium: 4.3 mmol/L (ref 3.5–5.1)
Sodium: 136 mmol/L (ref 135–145)

## 2021-04-18 LAB — MAGNESIUM: Magnesium: 2.4 mg/dL (ref 1.7–2.4)

## 2021-04-18 LAB — PHOSPHORUS: Phosphorus: 4 mg/dL (ref 2.5–4.6)

## 2021-04-18 MED ORDER — FUROSEMIDE 10 MG/ML IJ SOLN: 40.0000 mg | Freq: Once | INTRAMUSCULAR | Status: DC

## 2021-04-18 MED ORDER — LIP MEDEX EX OINT
1.0000 | TOPICAL_OINTMENT | Freq: Two times a day (BID) | CUTANEOUS | Status: DC
Start: 2021-04-18 — End: 2021-04-18
  Administered 2021-04-18: 1 via TOPICAL
  Filled 2021-04-18: qty 7

## 2021-04-18 MED ORDER — SODIUM CHLORIDE 0.9% FLUSH
3.0000 mL | Freq: Two times a day (BID) | INTRAVENOUS | Status: DC
  Administered 2021-04-18: 3 mL via INTRAVENOUS

## 2021-04-18 MED ORDER — MAGIC MOUTHWASH
15.0000 mL | Freq: Four times a day (QID) | ORAL | Status: DC | PRN
  Filled 2021-04-18: qty 15

## 2021-04-18 MED ORDER — METOPROLOL TARTRATE 5 MG/5ML IV SOLN
5.0000 mg | Freq: Four times a day (QID) | INTRAVENOUS | Status: DC | PRN
Start: 2021-04-18 — End: 2021-04-18

## 2021-04-18 MED ORDER — FUROSEMIDE 40 MG PO TABS
40.0000 mg | ORAL_TABLET | Freq: Two times a day (BID) | ORAL | Status: DC
  Administered 2021-04-18: 40 mg via ORAL
  Filled 2021-04-18: qty 1

## 2021-04-18 MED ORDER — SODIUM CHLORIDE 0.9% FLUSH
3.0000 mL | INTRAVENOUS | Status: DC | PRN
Start: 2021-04-18 — End: 2021-04-18

## 2021-04-18 MED ORDER — ENALAPRILAT 1.25 MG/ML IV SOLN
0.6250 mg | Freq: Four times a day (QID) | INTRAVENOUS | Status: DC | PRN
  Filled 2021-04-18: qty 1

## 2021-04-18 MED ORDER — HYDROMORPHONE HCL 1 MG/ML IJ SOLN: 0.5000 mg | INTRAMUSCULAR | Status: DC | PRN

## 2021-04-18 MED ORDER — SODIUM CHLORIDE 0.9 % IV SOLN: 250.0000 mL | INTRAVENOUS | Status: DC | PRN

## 2021-04-18 MED ORDER — OXYCODONE HCL 5 MG/5ML PO SOLN: 5.0000 mg | ORAL | Status: DC | PRN

## 2021-04-18 NOTE — Progress Notes (Signed)
Dennis Swanson [id number] 07-23-1955  CARE TEAM:  PCP: Dennis Blitz, MD  Outpatient Care Team: Patient Care Team: Dennis Blitz, MD as PCP - General (Internal Medicine) Dennis Swanson, Dennis Guild, MD as PCP - Cardiology (Cardiology) Dennis Binder, MD (Inactive) as Attending Physician (Gastroenterology) Dennis Boston, MD as Consulting Physician (General Surgery) Dennis Harman, DO as Consulting Physician (Gastroenterology) Dennis Swanson Dennis Guild, MD as Consulting Physician (Cardiology)  Inpatient Treatment Team: Treatment Team: Attending Provider: Michael Boston, MD; Registered Nurse: Dennis Caul, RN; Registered Nurse: Dennis Lindau, RN; Pharmacist: Dennis Swanson, [facility]   Problem List:   Principal Problem:   Hypoxia Active Problems:   Congestive heart failure [facility])   Essential hypertension   Nonischemic cardiomyopathy ([place])   Morbid obesity ([place])   CKD (chronic kidney disease) stage 3, GFR 30-59 ml/min ([facility])   S/P hemorrhoidectomy   1 Day Post-Op  04/17/2021  POST-OPERATIVE DIAGNOSIS:   PROLAPSING ANORECTAL MASS HEMORRHOIDS PROLAPSED GRADE 3 & 4 WITH BLEEDING   PROCEDURE:   Excision of prolapsing anorectal mass Internal and external hemorrhoidectomy x1 Internal hemorrhoidectomy x1 Internal hemorrhoidal ligation and pexy Anorectal examination under anesthesia   SURGEON:  Dennis Hector, MD  OR FINDINGS:  Large left posterior chronically prolapsed anorectal mass.  Mucosal changes on it that could be suspicious for adenomatous changes versus large chronically prolapsed hemorrhoid.  Ligation/pexy/excision done.  Large right anteriolateral posterior internal/external hemorrhoid at least grade 3, probably grade 4.  Ligation/pexy/hemorrhoidectomy done.  Inflamed grade 2 right anterior internal hemorrhoid.  Internal partial hemorrhoidectomy done.   No fissure.  No fistula.  No abscess.  No condyloma.  No true circumferential rectal prolapse.  No pilonidal disease.   Moderate perianal pruritus consistent with chronic prolapsing tissue        Assessment  Postop hypoxia  [place] Sw[facility] Hospital Stay = 0 days)  Plan:  -He clinically seems to be doing relatively well.  We will give dose of Lasix.  Most likely can leave later today if he needs goals -VTE prophylaxis- SCDs, etc -mobilize as tolerated to help recovery  Disposition:  Disposition:  The patient is from: Home  Anticipate discharge to:  Home  Anticipated Date of Discharge is:  March 3,2023    Barriers to discharge:  Pending Clinical improvement (more likely than not)  Patient currently is NOT MEDICALLY STABLE for discharge from the hospital from a surgery standpoint.      I reviewed last 24 h vitals and pain scores, last 48 h intake and output, last 24 h labs and trends, and last 24 h imaging results. I have reviewed this patient's available data, including medical history, events of note, test results, etc as part of my evaluation.  A significant portion of that time was spent in counseling.  Care during the described time interval was provided by me.  This care required moderate level of medical decision making.  04/18/2021    Subjective: (Chief complaint)  Patient sitting in bed denying any pain.  Breathing more comfortably.  Does like he is at his baseline.  Has gotten up and walking in the room.  Voided x4 without difficulty  Nursing just outside room.  Objective:  Vital signs:  Vitals:   04/17/21 2021 04/17/21 2129 04/18/21 0200 04/18/21 0417  BP: (!) 136/92 137/90 (!) 153/105 136/87  Pulse: 76 77 85 79  Resp: 18 14 16 16   Temp:  97.7 F (36.5 C) (!) 97.4 F (36.3 C) 97.7 F (36.5 C)  TempSrc:  Oral Oral Oral  SpO2: 92% 94% 95% 96%  Weight:      Height:        Last BM Date : 04/17/21  Intake/Output   Yesterday:  03/02 0701 - 03/03 0700 In: 1100 [I.V.:1000; IV Piggyback:100] Out: 50 [Blood:50] This shift:  No intake/output data recorded.  Bowel  function:  Flatus: YES  BM:  No  Drain: (No drain)   Physical Exam:  General: Pt awake/alert in no acute distress Eyes: PERRL, normal EOM.  Sclera clear.  No icterus Neuro: CN II-XII intact w/o focal sensory/motor deficits. Lymph: No head/neck/groin lymphadenopathy Psych:  No delerium/psychosis/paranoia.  Oriented x 4 HENT: Normocephalic, Mucus membranes moist.  No thrush Neck: Supple, No tracheal deviation.  No obvious thyromegaly Chest: No pain to chest wall compression.  Good respiratory excursion.  No audible wheezing CV:  Pulses intact.  Regular rhythm.  No major extremity edema MS: Normal AROM mjr joints.  No obvious deformity Abdomen: Soft.  Nondistended.  Nontender.  No evidence of peritonitis.  No incarcerated hernias.  GU: Voiding on own.  No inguinal hernia.  Rectal deferred.  Ext:  No deformity.  No mjr edema.  No cyanosis Skin: No petechiae / purpurea.  No major sores.  Warm and dry    Results:   Cultures: Recent Results (from the past 720 hour(s))  Resp Panel by RT-PCR (Flu A&B, Covid) Nasopharyngeal Swab     Status: None   Collection Time: 04/17/21  8:21 PM   Specimen: Nasopharyngeal Swab; Nasopharyngeal(NP) swabs in vial transport medium  Result Value Ref Range Status   SARS Coronavirus 2 by RT PCR NEGATIVE NEGATIVE Final    Comment: (NOTE) SARS-CoV-2 target nucleic acids are NOT DETECTED.  The SARS-CoV-2 RNA is generally detectable in upper respiratory specimens during the acute phase of infection. The lowest concentration of SARS-CoV-2 viral copies this assay can detect is 138 copies/mL. A negative result does not preclude SARS-Cov-2 infection and should not be used as the sole basis for treatment or other patient management decisions. A negative result may occur with  improper specimen collection/handling, submission of specimen other than nasopharyngeal swab, presence of viral mutation(s) within the areas targeted by this assay, and inadequate  number of viral copies(<138 copies/mL). A negative result must be combined with clinical observations, patient history, and epidemiological information. The expected result is Negative.  Fact Sheet for Patients:  EntrepreneurPulse.com.au  Fact Sheet for Healthcare Providers:  IncredibleEmployment.be  This test is no t yet approved or cleared by the [place] FDA and  has been authorized for detection and/or diagnosis of SARS-CoV-2 by FDA under an Emergency Use Authorization (EUA). This EUA will remain  in effect (meaning this test can be used) for the duration of the COVID-19 declaration under Section 564(b)(1) of the Act, 21 U.S.C.section 360bbb-3(b)(1), unless the authorization is terminated  or revoked sooner.       Influenza A by PCR NEGATIVE NEGATIVE Final   Influenza B by PCR NEGATIVE NEGATIVE Final    Comment: (NOTE) The Xpert Xpress SARS-CoV-2/FLU/RSV plus assay is intended as an aid in the diagnosis of influenza from Nasopharyngeal swab specimens and should not be used as a sole basis for treatment. Nasal washings and aspirates are unacceptable for Xpert Xpress SARS-CoV-2/FLU/RSV testing.  Fact Sheet for Patients: EntrepreneurPulse.com.au  Fact Sheet for Healthcare Providers: IncredibleEmployment.be  This test is not yet approved or cleared by the [place] FDA and has been authorized for detection and/or diagnosis of SARS-CoV-2 by FDA  under an Emergency Use Authorization (EUA). This EUA will remain in effect (meaning this test can be used) for the duration of the COVID-19 declaration under Section 564(b)(1) of the Act, 21 U.S.C. section 360bbb-3(b)(1), unless the authorization is terminated or revoked.  Performed at [facility], [place] [address], [place], [place] [address]     Labs: Results for orders placed or performed during the hospital encounter of 04/17/21  (from the past 48 hour(s))  Resp Panel by RT-PCR (Flu A&B, Covid) Nasopharyngeal Swab     Status: None   Collection Time: 04/17/21  8:21 PM   Specimen: Nasopharyngeal Swab; Nasopharyngeal(NP) swabs in vial transport medium  Result Value Ref Range   SARS Coronavirus 2 by RT PCR NEGATIVE NEGATIVE    Comment: (NOTE) SARS-CoV-2 target nucleic acids are NOT DETECTED.  The SARS-CoV-2 RNA is generally detectable in upper respiratory specimens during the acute phase of infection. The lowest concentration of SARS-CoV-2 viral copies this assay can detect is 138 copies/mL. A negative result does not preclude SARS-Cov-2 infection and should not be used as the sole basis for treatment or other patient management decisions. A negative result may occur with  improper specimen collection/handling, submission of specimen other than nasopharyngeal swab, presence of viral mutation(s) within the areas targeted by this assay, and inadequate number of viral copies(<138 copies/mL). A negative result must be combined with clinical observations, patient history, and epidemiological information. The expected result is Negative.  Fact Sheet for Patients:  EntrepreneurPulse.com.au  Fact Sheet for Healthcare Providers:  IncredibleEmployment.be  This test is no t yet approved or cleared by the [place] FDA and  has been authorized for detection and/or diagnosis of SARS-CoV-2 by FDA under an Emergency Use Authorization (EUA). This EUA will remain  in effect (meaning this test can be used) for the duration of the COVID-19 declaration under Section 564(b)(1) of the Act, 21 U.S.C.section 360bbb-3(b)(1), unless the authorization is terminated  or revoked sooner.       Influenza A by PCR NEGATIVE NEGATIVE   Influenza B by PCR NEGATIVE NEGATIVE    Comment: (NOTE) The Xpert Xpress SARS-CoV-2/FLU/RSV plus assay is intended as an aid in the diagnosis of influenza from  Nasopharyngeal swab specimens and should not be used as a sole basis for treatment. Nasal washings and aspirates are unacceptable for Xpert Xpress SARS-CoV-2/FLU/RSV testing.  Fact Sheet for Patients: EntrepreneurPulse.com.au  Fact Sheet for Healthcare Providers: IncredibleEmployment.be  This test is not yet approved or cleared by the [place] FDA and has been authorized for detection and/or diagnosis of SARS-CoV-2 by FDA under an Emergency Use Authorization (EUA). This EUA will remain in effect (meaning this test can be used) for the duration of the COVID-19 declaration under Section 564(b)(1) of the Act, 21 U.S.C. section 360bbb-3(b)(1), unless the authorization is terminated or revoked.  Performed at [facility], [place] [address] [place], [place] [address]   Basic metabolic panel     Status: Abnormal   Collection Time: 04/18/21  5:05 AM  Result Value Ref Range   Sodium 136 135 - 145 mmol/L   Potassium 4.3 3.5 - 5.1 mmol/L   Chloride 98 98 - 111 mmol/L   CO2 32 22 - 32 mmol/L   Glucose, Bld 149 (H) 70 - 99 mg/dL    Comment: Glucose reference range applies only to samples taken after fasting for at least 8 hours.   BUN 14 8 - 23 mg/dL   Creatinine, Ser 1.07 0.61 -  1.24 mg/dL   Calcium 8.6 (L) 8.9 - 10.3 mg/dL   GFR, Estimated >60 >60 mL/min    Comment: (NOTE) Calculated using the CKD-EPI Creatinine Equation (2021)    Anion gap 6 5 - 15    Comment: Performed at [facility], [place] [address], [place], [place] [address]  Magnesium     Status: None   Collection Time: 04/18/21  5:05 AM  Result Value Ref Range   Magnesium 2.4 1.7 - 2.4 mg/dL    Comment: Performed at [facility], [place] [address], [place], [place] [address]  Phosphorus     Status: None   Collection Time: 04/18/21  5:05 AM  Result Value Ref Range   Phosphorus 4.0 2.5 - 4.6 mg/dL    Comment: Performed at [facility], [place] [address] [place], [place] [address]    Imaging / Studies: No results found.  Medications / Allergies: per chart  Antibiotics: Anti-infectives (From admission, onward)    Start     Dose/Rate Route Frequency Ordered Stop   04/17/21 1315  cefTRIAXone (ROCEPHIN) 2 g in sodium chloride 0.9 % 100 mL IVPB        2 g 200 mL/hr over 30 Minutes Intravenous On call to O.R. 04/17/21 1305 04/17/21 1507         Note: Portions of this report may have been transcribed using voice recognition software. Every effort was made to ensure accuracy; however, inadvertent computerized transcription errors may be present.   Any transcriptional errors that result from this process are unintentional.    Dennis Hector, MD, FACS, MASCRS Esophageal, Gastrointestinal & Colorectal Surgery Robotic and Minimally Invasive Surgery  [facility], [place]  [place]. [address], [place], [place] [address] [phone] Fax [phone] Main  CONTACT INFORMATION:  Weekday (9AM-5PM): Call [facility] main office at [phone]  Weeknight (5PM-9AM) or Weekend/Holiday: Check [email or web address] (password " [email or web address]") for General Surgery [facility] coverage  (Please, do not use SecureChat as it is not reliable communication to operating surgeons for immediate patient care)      04/18/2021  7:39 AM

## 2021-04-18 NOTE — Discharge Summary (Signed)
Physician Discharge Summary    Patient ID: Dennis Swanson MRN: [id number] DOB/AGE: [id number]  66 y.o.  Patient Care Team: Monico Blitz, MD as PCP - General (Internal Medicine) Harl Bowie, Alphonse Guild, MD as PCP - Cardiology (Cardiology) Danie Binder, MD (Inactive) as Attending Physician (Gastroenterology) Michael Boston, MD as Consulting Physician (General Surgery) Eloise Harman, DO as Consulting Physician (Gastroenterology) Arnoldo Lenis, MD as Consulting Physician (Cardiology)  Admit date: 04/17/2021  Discharge date: 04/18/2021  Hospital Stay = 0 days    Discharge Diagnoses:  Principal Problem:   Hypoxia Active Problems:   Congestive heart failure [facility])   Essential hypertension   Nonischemic cardiomyopathy ([place])   Morbid obesity ([place])   CKD (chronic kidney disease) stage 3, GFR 30-59 ml/min (HCC)   S/P hemorrhoidectomy   1 Day Post-Op  04/17/2021  POST-OPERATIVE DIAGNOSIS:   HEMORRHOIDS PROLAPSED GRADE 4 WITH BLEEDING  SURGERY:  04/17/2021  Procedure(s): HEMORRHOIDECTOMY WITH LIGATION AND HEMORRHOIDOPEXY, ANORECTAL EXAMINATION UNDER ANESTHESIA, EXCISION OF PROLAPSE ANAL MASS, EXCISON OF GRADE 3 AND 4 PROLAPSING HEMORRHOIDS  SURGEON:    Surgeon(s): Michael Boston, MD  Consults: [facility] Course:   The patient underwent the surgery above.  Postoperatively, the patient gradually mobilized and advanced to a solid diet.  Pain and other symptoms were treated aggressively.  Patient had some hypoxia in the PACU cannot be weaned off of oxygen.  Anesthesia 1 the patient admitted overnight for observation.  By the time of discharge, the patient was walking well the hallways, eating food, having flatus. Dyspnea on exertion.  Diuresed well.  Felt better.  Weaned to room air.  Pain was well-controlled on an oral medications.  Based on meeting discharge criteria and continuing to recover, I felt it was safe for the patient to be discharged from the hospital to  further recover with close followup. Postoperative recommendations were discussed in detail.  They are written as well.  Discharged Condition: good  Discharge Exam: Blood pressure 136/87, pulse 80, temperature 97.7 F (36.5 C), temperature source Oral, resp. rate 16, height 6' (1.829 m), weight 112.5 kg, SpO2 94 %.  General: Pt awake/alert/oriented x4 in No acute distress Eyes: PERRL, normal EOM.  Sclera clear.  No icterus Neuro: CN II-XII intact w/o focal sensory/motor deficits. Lymph: No head/neck/groin lymphadenopathy Psych:  No delerium/psychosis/paranoia HENT: Normocephalic, Mucus membranes moist.  No thrush Neck: Supple, No tracheal deviation Chest: No chest wall pain w good excursion CV:  Pulses intact.  Regular rhythm MS: Normal AROM mjr joints.  No obvious deformity Abdomen: Soft.  Nondistended.  Nontender.  No evidence of peritonitis.  No incarcerated hernias. Ext:  SCDs BLE.  No mjr edema.  No cyanosis Skin: No petechiae / purpura   Disposition:    Follow-up Information     Michael Boston, MD Follow up in 3 week(s).   Specialties: General Surgery, Colon and Rectal Surgery Why: To follow up after your operation Contact information: [place] [address] [phone]                 Discharge disposition: 01-Home or Self Care       Discharge Instructions     Call MD for:  hives   Complete by: As directed    Call MD for:  hives   Complete by: As directed    Call MD for:  persistant dizziness or light-headedness   Complete by: As directed    Call MD for:  persistant dizziness or  light-headedness   Complete by: As directed    Call MD for:  persistant nausea and vomiting   Complete by: As directed    Call MD for:  persistant nausea and vomiting   Complete by: As directed    Call MD for:  redness, tenderness, or signs of infection (pain, swelling, redness, odor or green/yellow discharge around incision site)   Complete by:  As directed    You will often notice bleeding with bowel movements.  Expect some yellow or tan drainage.  This can occur for weeks, but should be mild by the end of the first week of surgery.  Wear an absorbent pad or soft cotton gauze in your underwear until the drainage stops   Call MD for:  redness, tenderness, or signs of infection (pain, swelling, redness, odor or green/yellow discharge around incision site)   Complete by: As directed    You will often notice bleeding with bowel movements.  Expect some yellow or tan drainage.  This can occur for weeks, but should be mild by the end of the first week of surgery.  Wear an absorbent pad or soft cotton gauze in your underwear until the drainage stops   Call MD for:  severe uncontrolled pain   Complete by: As directed    Call MD for:  severe uncontrolled pain   Complete by: As directed    Diet - low sodium heart healthy   Complete by: As directed    Discharge instructions   Complete by: As directed    See Rectal Surgery instruction sheet   Discharge instructions   Complete by: As directed    See Rectal Surgery instruction sheet   Discharge wound care:   Complete by: As directed    -Allow the fluffed gauze to come off with the 1st bowel movement.  Reinforce as needed if you have bleeding or soiling  -Bathe / shower every day.  Just comfortable warm water.  Avoid salts/soaps.  Keep the area clean by showering / bathing over the perianal region.   Squeeze bottles of water work as well,.  It is okay to bathe even with an open wound to help wash it.  Can try ice packs as well.  -Wet wipes or showers / gentle washing after bowel movements is often less traumatic than regular toilet paper.  Consider using a squeeze bottle of warm water to rinse the perianal region.  -You will notice bleeding, yellow drainage, and occasional stool leaking, especially with bowel movements.  This should slow down by the end of the first week of surgery, but expect  some drainage for many weeks.  Replace cotton balls/pads as needed  Wear an absorbent pad or soft cotton balls on the anus as needed to catch any drainage and help keep the area clean and dry.  This is less sticking/painful than regular gauze   Discharge wound care:   Complete by: As directed    -Allow the fluffed gauze to come off with the 1st bowel movement.  Reinforce as needed if you have bleeding or soiling  -Bathe / shower every day.  Just comfortable warm water.  Avoid salts/soaps.  Keep the area clean by showering / bathing over the perianal region.   Squeeze bottles of water work as well,.  It is okay to bathe even with an open wound to help wash it.  Can try ice packs as well.  -Wet wipes or showers / gentle washing after bowel movements is often less  traumatic than regular toilet paper.  Consider using a squeeze bottle of warm water to rinse the perianal region.  -You will notice bleeding, yellow drainage, and occasional stool leaking, especially with bowel movements.  This should slow down by the end of the first week of surgery, but expect some drainage for many weeks.  Replace cotton balls/pads as needed  Wear an absorbent pad or soft cotton balls on the anus as needed to catch any drainage and help keep the area clean and dry.  This is less sticking/painful than regular gauze   Driving Restrictions   Complete by: As directed    You may drive when you are no longer taking prescription pain medication, you can comfortably sit for long periods of time, and you can safely maneuver your car and apply brakes.  Expect at least 1-2 weeks   Driving Restrictions   Complete by: As directed    You may drive when you are no longer taking prescription pain medication, you can comfortably sit for long periods of time, and you can safely maneuver your car and apply brakes.  Expect at least 1-2 weeks   Increase activity slowly   Complete by: As directed    Increase activity slowly   Complete by:  As directed    Lifting restrictions   Complete by: As directed    You may resume regular (light) daily activities beginning the next day-such as daily self-care, walking, climbing stairs-gradually increasing activities as tolerated.  If you can walk 30 minutes without difficulty, it is safe to try more intense activity such as jogging, treadmill, bicycling, low-impact aerobics, swimming, etc. Save the most intensive and strenuous activity for last such as sit-ups, heavy lifting, contact sports, etc  Refrain from any heavy lifting or straining until you are off narcotics for pain control.  Remember: if it hurts to do it, don't do it: STOP   Lifting restrictions   Complete by: As directed    You may resume regular (light) daily activities beginning the next day-such as daily self-care, walking, climbing stairs-gradually increasing activities as tolerated.  If you can walk 30 minutes without difficulty, it is safe to try more intense activity such as jogging, treadmill, bicycling, low-impact aerobics, swimming, etc. Save the most intensive and strenuous activity for last such as sit-ups, heavy lifting, contact sports, etc  Refrain from any heavy lifting or straining until you are off narcotics for pain control.  Remember: if it hurts to do it, don't do it: STOP   May shower / Bathe   Complete by: As directed    Warm water sitz baths/tub soaks/showers  x 20-30 minutes, up to 8 times a day for comfort  Just comfortable warm water.  Avoid salts/soaps.  Can try using ice packs as an alternative.  Consider using a squeeze bottle of warm water to rinse the perianal region.   May shower / Bathe   Complete by: As directed    Warm water sitz baths/tub soaks/showers  x 20-30 minutes, up to 8 times a day for comfort  Just comfortable warm water.  Avoid salts/soaps.  Can try using ice packs as an alternative.  Consider using a squeeze bottle of warm water to rinse the perianal region.   May walk up steps    Complete by: As directed    May walk up steps   Complete by: As directed    Sexual Activity Restrictions   Complete by: As directed    You may have  sexual intercourse when it is comfortable. If it hurts to do it, STOP.   Sexual Activity Restrictions   Complete by: As directed    You may have sexual intercourse when it is comfortable. If it hurts to do it, STOP.       Allergies as of 04/18/2021   No Known Allergies      Medication List     TAKE these medications    apixaban 5 MG Tabs tablet Commonly known as: ELIQUIS Take 1 tablet (5 mg total) by mouth 2 (two) times daily.   carvedilol 6.25 MG tablet Commonly known as: COREG Take 1 tablet (6.25 mg total) by mouth 2 (two) times daily with a meal.   diazepam 5 MG tablet Commonly known as: VALIUM Take 1 tablet (5 mg total) by mouth every 8 (eight) hours as needed for muscle spasms (difficulty urinating).   Entresto 24-26 MG Generic drug: sacubitril-valsartan Take 1 tablet by mouth 2 (two) times daily.   ferrous sulfate 325 (65 FE) MG tablet Take 325 mg by mouth daily with breakfast.   furosemide 40 MG tablet Commonly known as: LASIX Take 1 tablet (40 mg total) by mouth 2 (two) times daily.   hydrocortisone 25 MG suppository Commonly known as: ANUSOL-HC Place 1 suppository (25 mg total) rectally 2 (two) times daily. What changed:  when to take this reasons to take this   oxyCODONE 5 MG immediate release tablet Commonly known as: Oxy IR/ROXICODONE Take 1 tablet (5 mg total) by mouth every 6 (six) hours as needed for severe pain or breakthrough pain.   pantoprazole 40 MG tablet Commonly known as: PROTONIX Take 1 tablet (40 mg total) by mouth daily.   spironolactone 25 MG tablet Commonly known as: ALDACTONE Take 1 tablet (25 mg total) by mouth daily.               Discharge Care Instructions  (From admission, onward)           Start     Ordered   04/18/21 0000  Discharge wound care:        Comments: -Allow the fluffed gauze to come off with the 1st bowel movement.  Reinforce as needed if you have bleeding or soiling  -Bathe / shower every day.  Just comfortable warm water.  Avoid salts/soaps.  Keep the area clean by showering / bathing over the perianal region.   Squeeze bottles of water work as well,.  It is okay to bathe even with an open wound to help wash it.  Can try ice packs as well.  -Wet wipes or showers / gentle washing after bowel movements is often less traumatic than regular toilet paper.  Consider using a squeeze bottle of warm water to rinse the perianal region.  -You will notice bleeding, yellow drainage, and occasional stool leaking, especially with bowel movements.  This should slow down by the end of the first week of surgery, but expect some drainage for many weeks.  Replace cotton balls/pads as needed  Wear an absorbent pad or soft cotton balls on the anus as needed to catch any drainage and help keep the area clean and dry.  This is less sticking/painful than regular gauze   04/18/21 0800   04/17/21 0000  Discharge wound care:       Comments: -Allow the fluffed gauze to come off with the 1st bowel movement.  Reinforce as needed if you have bleeding or soiling  -Bathe / shower every day.  Just  comfortable warm water.  Avoid salts/soaps.  Keep the area clean by showering / bathing over the perianal region.   Squeeze bottles of water work as well,.  It is okay to bathe even with an open wound to help wash it.  Can try ice packs as well.  -Wet wipes or showers / gentle washing after bowel movements is often less traumatic than regular toilet paper.  Consider using a squeeze bottle of warm water to rinse the perianal region.  -You will notice bleeding, yellow drainage, and occasional stool leaking, especially with bowel movements.  This should slow down by the end of the first week of surgery, but expect some drainage for many weeks.  Replace cotton balls/pads as  needed  Wear an absorbent pad or soft cotton balls on the anus as needed to catch any drainage and help keep the area clean and dry.  This is less sticking/painful than regular gauze   04/17/21 1433            Significant Diagnostic Studies:  Results for orders placed or performed during the hospital encounter of 04/17/21 (from the past 72 hour(s))  Resp Panel by RT-PCR (Flu A&B, Covid) Nasopharyngeal Swab     Status: None   Collection Time: 04/17/21  8:21 PM   Specimen: Nasopharyngeal Swab; Nasopharyngeal(NP) swabs in vial transport medium  Result Value Ref Range   SARS Coronavirus 2 by RT PCR NEGATIVE NEGATIVE    Comment: (NOTE) SARS-CoV-2 target nucleic acids are NOT DETECTED.  The SARS-CoV-2 RNA is generally detectable in upper respiratory specimens during the acute phase of infection. The lowest concentration of SARS-CoV-2 viral copies this assay can detect is 138 copies/mL. A negative result does not preclude SARS-Cov-2 infection and should not be used as the sole basis for treatment or other patient management decisions. A negative result may occur with  improper specimen collection/handling, submission of specimen other than nasopharyngeal swab, presence of viral mutation(s) within the areas targeted by this assay, and inadequate number of viral copies(<138 copies/mL). A negative result must be combined with clinical observations, patient history, and epidemiological information. The expected result is Negative.  Fact Sheet for Patients:  EntrepreneurPulse.com.au  Fact Sheet for Healthcare Providers:  IncredibleEmployment.be  This test is no t yet approved or cleared by the [place] FDA and  has been authorized for detection and/or diagnosis of SARS-CoV-2 by FDA under an Emergency Use Authorization (EUA). This EUA will remain  in effect (meaning this test can be used) for the duration of the COVID-19 declaration under  Section 564(b)(1) of the Act, 21 U.S.C.section 360bbb-3(b)(1), unless the authorization is terminated  or revoked sooner.       Influenza A by PCR NEGATIVE NEGATIVE   Influenza B by PCR NEGATIVE NEGATIVE    Comment: (NOTE) The Xpert Xpress SARS-CoV-2/FLU/RSV plus assay is intended as an aid in the diagnosis of influenza from Nasopharyngeal swab specimens and should not be used as a sole basis for treatment. Nasal washings and aspirates are unacceptable for Xpert Xpress SARS-CoV-2/FLU/RSV testing.  Fact Sheet for Patients: EntrepreneurPulse.com.au  Fact Sheet for Healthcare Providers: IncredibleEmployment.be  This test is not yet approved or cleared by the [place] FDA and has been authorized for detection and/or diagnosis of SARS-CoV-2 by FDA under an Emergency Use Authorization (EUA). This EUA will remain in effect (meaning this test can be used) for the duration of the COVID-19 declaration under Section 564(b)(1) of the Act, 21 U.S.C. section 360bbb-3(b)(1), unless the authorization is  terminated or revoked.  Performed at [facility], [place] [address], [place], [place] [address]   Basic metabolic panel     Status: Abnormal   Collection Time: 04/18/21  5:05 AM  Result Value Ref Range   Sodium 136 135 - 145 mmol/L   Potassium 4.3 3.5 - 5.1 mmol/L   Chloride 98 98 - 111 mmol/L   CO2 32 22 - 32 mmol/L   Glucose, Bld 149 (H) 70 - 99 mg/dL    Comment: Glucose reference range applies only to samples taken after fasting for at least 8 hours.   BUN 14 8 - 23 mg/dL   Creatinine, Ser 1.07 0.61 - 1.24 mg/dL   Calcium 8.6 (L) 8.9 - 10.3 mg/dL   GFR, Estimated >60 >60 mL/min    Comment: (NOTE) Calculated using the CKD-EPI Creatinine Equation (2021)    Anion gap 6 5 - 15    Comment: Performed at [facility], [place] [address], [place], [place] [address]  Magnesium     Status: None   Collection Time:  04/18/21  5:05 AM  Result Value Ref Range   Magnesium 2.4 1.7 - 2.4 mg/dL    Comment: Performed at [facility], [place] [address], [place], [place] [address]  Phosphorus     Status: None   Collection Time: 04/18/21  5:05 AM  Result Value Ref Range   Phosphorus 4.0 2.5 - 4.6 mg/dL    Comment: Performed at [facility], [place] [address], [place], [place] [address]    No results found.  Past Medical History:  Diagnosis Date   Acute pulmonary embolism ([place]) 02/16/2009   Alcohol abuse    Atrial flutter (HCC)    CHF (congestive heart failure) ([place])    a. EF 40-45% in 2011 b. EF at 60-65% in 04/2015 c. EF at 45-50% by echo in 09/2020   Dysrhythmia    Hypertension    Lower extremity cellulitis    bilateral   Systolic dysfunction     Past Surgical History:  Procedure Laterality Date   COLONOSCOPY N/A 05/17/2012   Procedure: COLONOSCOPY;  Surgeon: Danie Binder, MD;  Location[facility];  Service: Endoscopy;  Laterality: N/A;  10:30   COLONOSCOPY WITH PROPOFOL N/A 09/15/2020   Surgeon: Hurshel Keys K, DO;  2 cm rubbery textured nodular anal mass, internal hemorrhoids, external hemorrhoids, sigmoid and descending diverticula, redundant colon.  Pathology revealed polypoid rectal mucosa with acute and chronic inflammation with erosion, granular hyperplasia and macular isolation of lamina propria, consistent with mucosal prolapse.   ESOPHAGOGASTRODUODENOSCOPY (EGD) WITH PROPOFOL N/A 09/15/2020   Surgeon: Eloise Harman, DO; Small hiatal hernia, gastritis biopsied, normal examined duodenum.  Biopsy with no specific histopathologic changes, negative for H. pylori.   EVALUATION UNDER ANESTHESIA WITH HEMORRHOIDECTOMY N/A 04/17/2021   Procedure: HEMORRHOIDECTOMY WITH LIGATION AND HEMORRHOIDOPEXY, ANORECTAL EXAMINATION UNDER ANESTHESIA, EXCISION OF PROLAPSE ANAL MASS, EXCISON OF GRADE 3 AND 4 PROLAPSING HEMORRHOIDS;  Surgeon: Michael Boston, MD;  Location: [facility]  ORS;  Service: General;  Laterality: N/A;   None     TONSILLECTOMY      Social History   Socioeconomic History   Marital status: Single    Spouse name: Not on file   Number of children: Not on file   Years of education: Not on file   Highest education level: Not on file  Occupational History   Occupation: Unemployed, 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  Tobacco Use  Smoking status: Never   Smokeless tobacco: Never  Vaping Use   Vaping Use: Never used  Substance and Sexual Activity   Alcohol use: Yes    Alcohol/week: 3.0 standard drinks    Types: 3 Cans of beer per week    Comment: 1-2 beer a week   Drug use: No   Sexual activity: Not Currently  Other Topics Concern   Not on file  Social History Narrative   Lives alone, 2 daughters   No regular exercise   Social Determinants of Health   Financial Resource Strain: Low Risk    Difficulty of Paying Living Expenses: Not very hard  Food Insecurity: No Food Insecurity   Worried About Charity fundraiser in the Last Year: Never true   Ran Out of Food in the Last Year: Never true  Transportation Needs: No Transportation Needs   Lack of Transportation (Medical): No   Lack of Transportation (Non-Medical): No  Physical Activity: Inactive   Days of Exercise per Week: 0 days   Minutes of Exercise per Session: 0 min  Stress: Not on file  Social Connections: Not on file  Intimate Partner Violence: Not on file    Family History  Problem Relation Age of Onset   Heart attack Mother    Hypertension Brother    Diabetes Brother    Cancer Father    Colon cancer Neg Hx     Current Facility-Administered Medications  Medication Dose Route Frequency Provider Last Rate Last Admin   0.9 %  sodium chloride infusion  250 mL Intravenous PRN Michael Boston, MD   Stopped at 04/18/21 0901   acetaminophen (TYLENOL) tablet 1,000 mg  1,000 mg Oral Q8H Greer Pickerel, MD   1,000 mg at 04/18/21 0506   carvedilol (COREG) tablet 6.25 mg  6.25  mg Oral BID WC Greer Pickerel, MD   6.25 mg at 04/18/21 0853   enalaprilat (VASOTEC) injection 0.625-1.25 mg  0.625-1.25 mg Intravenous Q6H PRN Michael Boston, MD       furosemide (LASIX) tablet 40 mg  40 mg Oral BID Michael Boston, MD   40 mg at 04/18/21 9628   heparin injection 5,000 Units  5,000 Units Subcutaneous Q8H Greer Pickerel, MD       HYDROmorphone (DILAUDID) injection 0.5-2 mg  0.5-2 mg Intravenous Q2H PRN Michael Boston, MD       lip balm (CARMEX) ointment 1 application  1 application Topical BID Michael Boston, MD   1 application at 36/62/94 0901   magic mouthwash  15 mL Oral QID PRN Michael Boston, MD       metoprolol tartrate (LOPRESSOR) injection 5 mg  5 mg Intravenous Q6H PRN Michael Boston, MD       morphine (PF) 2 MG/ML injection 1 mg  1 mg Intravenous Q2H PRN Greer Pickerel, MD       ondansetron (ZOFRAN-ODT) disintegrating tablet 4 mg  4 mg Oral Q6H PRN Greer Pickerel, MD       Or   ondansetron [facility]) injection 4 mg  4 mg Intravenous Q6H PRN Greer Pickerel, MD       oxyCODONE (ROXICODONE) 5 MG/5ML solution 5-10 mg  5-10 mg Oral Q4H PRN Michael Boston, MD       pantoprazole (PROTONIX) EC tablet 40 mg  40 mg Oral Daily Greer Pickerel, MD   40 mg at 04/18/21 0901   sacubitril-valsartan (ENTRESTO) 24-26 mg per tablet  1 tablet Oral BID Greer Pickerel, MD   1 tablet at 04/18/21  0901   simethicone (MYLICON) chewable tablet 40 mg  40 mg Oral Q6H PRN Greer Pickerel, MD       sodium chloride flush (NS) 0.9 % injection 3 mL  3 mL Intravenous Gorden Harms, MD   3 mL at 04/18/21 0901   sodium chloride flush (NS) 0.9 % injection 3 mL  3 mL Intravenous PRN Michael Boston, MD       spironolactone (ALDACTONE) tablet 25 mg  25 mg Oral Daily Greer Pickerel, MD   25 mg at 04/18/21 0901   Current Outpatient Medications  Medication Sig Dispense Refill   apixaban (ELIQUIS) 5 MG TABS tablet Take 1 tablet (5 mg total) by mouth 2 (two) times daily. 60 tablet 1   carvedilol (COREG) 6.25 MG tablet Take 1 tablet (6.25  mg total) by mouth 2 (two) times daily with a meal. 60 tablet 1   diazepam (VALIUM) 5 MG tablet Take 1 tablet (5 mg total) by mouth every 8 (eight) hours as needed for muscle spasms (difficulty urinating). 6 tablet 1   ferrous sulfate 325 (65 FE) MG tablet Take 325 mg by mouth daily with breakfast.     furosemide (LASIX) 40 MG tablet Take 1 tablet (40 mg total) by mouth 2 (two) times daily. 180 tablet 2   hydrocortisone (ANUSOL-HC) 25 MG suppository Place 1 suppository (25 mg total) rectally 2 (two) times daily. (Patient taking differently: Place 25 mg rectally 2 (two) times daily as needed for hemorrhoids.) 20 suppository 0   oxyCODONE (OXY IR/ROXICODONE) 5 MG immediate release tablet Take 1 tablet (5 mg total) by mouth every 6 (six) hours as needed for severe pain or breakthrough pain. 30 tablet 0   pantoprazole (PROTONIX) 40 MG tablet Take 1 tablet (40 mg total) by mouth daily. 30 tablet 1   sacubitril-valsartan (ENTRESTO) 24-26 MG Take 1 tablet by mouth 2 (two) times daily. 60 tablet 11   spironolactone (ALDACTONE) 25 MG tablet Take 1 tablet (25 mg total) by mouth daily. 90 tablet 2     No Known Allergies  Signed: Morton Peters, MD, FACS, MASCRS Esophageal, Gastrointestinal & Colorectal Surgery Robotic and Minimally Invasive Surgery  [facility], [place]  [address] [place], [place] [address] [phone] Fax [phone] Main  CONTACT INFORMATION:  Weekday (9AM-5PM): Call [facility] main office at [phone]  Weeknight (5PM-9AM) or Weekend/Holiday: Check [email or web address] (password " [email or web address]") for General Surgery [facility] coverage  (Please, do not use SecureChat as it is not reliable communication to operating surgeons for immediate patient care)      04/18/2021, 12:25 PM

## 2021-04-21 LAB — SURGICAL PATHOLOGY

## 2021-05-19 ENCOUNTER — Ambulatory Visit: Payer: Self-pay | Admitting: Surgery

## 2021-05-19 DIAGNOSIS — K635 Polyp of colon: Secondary | ICD-10-CM | POA: Insufficient documentation

## 2021-05-19 DIAGNOSIS — K4020 Bilateral inguinal hernia, without obstruction or gangrene, not specified as recurrent: Secondary | ICD-10-CM | POA: Insufficient documentation

## 2021-05-19 DIAGNOSIS — K642 Third degree hemorrhoids: Secondary | ICD-10-CM | POA: Insufficient documentation

## 2021-06-03 ENCOUNTER — Other Ambulatory Visit: Payer: Self-pay | Admitting: *Deleted

## 2021-06-03 DIAGNOSIS — D509 Iron deficiency anemia, unspecified: Secondary | ICD-10-CM

## 2021-06-09 ENCOUNTER — Encounter: Payer: Self-pay | Admitting: Internal Medicine

## 2021-06-17 NOTE — Progress Notes (Addendum)
Anesthesia Review: ? ?PCP:  DR Monico Blitz  ?Cardiologist : DR Deretha Emory  [place] with [facility] on 10/16/20  ?Chest x-ray :09/21/20  ?EKG : 09/23/20  ?Echo : 09/14/20  ?Stress test: ?Cardiac Cath :  ?Activity level:  can do a flight of stairs without difficulty  ?Sleep Study/ CPAP : none  ?Fasting Blood Sugar :      / Checks Blood Sugar -- times a day:   ?Blood Thinner/ Instructions /Last Dose: ?ASA / Instructions/ Last Dose :   ?Eliquis - stop 2 days prior per pt  ?Attempted to call pt at 1015 am on 5/3 and 103 am on 5/3 with no answer.  LVMM for Alfreda haith- niece.  Called daughter, Genevie Ann , who will go by house to check on pt andgive preop nurse a call.  Dtr never called nurse back as to status of pt . \Pt then called at 1130am and stated he had went to [facility] for preop appt.  And stated he was back in [place].  Scheduled pt to come on 06/19/2021 at 1000am for labs and to pick up bag.  Reviewed med hx and preop instructions with pt .  Instructions placed in bag along with hibiclens and Ensure for surgery on 06/20/2021.  ?Last surgery on 04/17/21 for hemorrhoidectomy   ?

## 2021-06-17 NOTE — Progress Notes (Signed)
DUE TO COVID-19 ONLY  2 VISITOR IS ALLOWED TO COME WITH YOU AND STAY IN THE WAITING ROOM ONLY DURING PRE OP AND PROCEDURE DAY OF SURGERY.   4  VISITOR  MAY VISIT WITH YOU AFTER SURGERY IN YOUR PRIVATE ROOM DURING VISITING HOURS ONLY! ?YOU MAY HAVE ONE PERSON SPEND THE NITE WITH YOU IN YOUR ROOM AFTER SURGERY.   ? ?Y Your procedure is scheduled on:  ?                 06/20/2021  ? Report to [facility] Main  Entrance ? ? Report to admitting at    1245pm               ?DO NOT BRING INSURANCE CARD, PICTURE ID OR WALLET DAY OF SURGERY.  ?  ? ? Call this number if you have problems the morning of surgery [phone]  ? ? REMEMBER: NO  SOLID FOODS , CANDY, GUM OR MINTS AFTER MIDNITE THE NITE BEFORE SURGERY .       Marland Kitchen CLEAR LIQUIDS UNTIL        1200 noon         DAY OF SURGERY.      PLEASE FINISH ENSURE DRINK PER SURGEON ORDER  WHICH NEEDS TO BE COMPLETED AT   1200 noon  day       OF SURGERY.   ? ? ? ? ?CLEAR LIQUID DIET ? ? ?Foods Allowed      ?WATER ?BLACK COFFEE ( SUGAR OK, NO MILK, CREAM OR CREAMER) REGULAR AND DECAF  ?TEA ( SUGAR OK NO MILK, CREAM, OR CREAMER) REGULAR AND DECAF  ?PLAIN JELLO ( NO RED)  ?FRUIT ICES ( NO RED, NO FRUIT PULP)  ?POPSICLES ( NO RED)  ?JUICE- APPLE, WHITE GRAPE AND WHITE CRANBERRY  ?SPORT DRINK LIKE GATORADE ( NO RED)  ?CLEAR BROTH ( VEGETABLE , CHICKEN OR BEEF)                                                               ? ?    ? ?BRUSH YOUR TEETH MORNING OF SURGERY AND RINSE YOUR MOUTH OUT, NO CHEWING GUM CANDY OR MINTS. ?  ? ? Take these medicines the morning of surgery with A SIP OF WATER:  coreg, protonix  ? ? ?DO NOT TAKE ANY DIABETIC MEDICATIONS DAY OF YOUR SURGERY ?                  ?            You may not have any metal on your body including hair pins and  ?            piercings  Do not wear jewelry, make-up, lotions, powders or perfumes, deodorant ?            Do not wear nail polish on your fingernails.   ?           IF YOU ARE A MALE AND WANT TO SHAVE UNDER ARMS OR  LEGS PRIOR TO SURGERY YOU MUST DO SO AT LEAST 48 HOURS PRIOR TO SURGERY.  ?            Men may shave face and neck. ? ? Do not bring valuables to the  hospital. [facility] IS NOT ?            RESPONSIBLE   FOR VALUABLES. ? Contacts, dentures or bridgework may not be worn into surgery. ? Leave suitcase in the car. After surgery it may be brought to your room. ? ?  ? Patients discharged the day of surgery will not be allowed to drive home. IF YOU ARE HAVING SURGERY AND GOING HOME THE SAME DAY, YOU MUST HAVE AN ADULT TO DRIVE YOU HOME AND BE WITH YOU FOR 24 HOURS. YOU MAY GO HOME BY TAXI OR UBER OR ORTHERWISE, BUT AN ADULT MUST ACCOMPANY YOU HOME AND STAY WITH YOU FOR 24 HOURS. ?  ? ?            Please read over the following fact sheets you were given: ?_____________________________________________________________________ ? ?[facility] - Preparing for Surgery ?Before surgery, you can play an important role.  Because skin is not sterile, your skin needs to be as free of germs as possible.  You can reduce the number of germs on your skin by washing with CHG (chlorahexidine gluconate) soap before surgery.  CHG is an antiseptic cleaner which kills germs and bonds with the skin to continue killing germs even after washing. ?Please DO NOT use if you have an allergy to CHG or antibacterial soaps.  If your skin becomes reddened/irritated stop using the CHG and inform your nurse when you arrive at Short Stay. ?Do not shave (including legs and underarms) for at least 48 hours prior to the first CHG shower.  You may shave your face/neck. ?Please follow these instructions carefully: ? 1.  Shower with CHG Soap the night before surgery and the  morning of Surgery. ? 2.  If you choose to wash your hair, wash your hair first as usual with your  normal  shampoo. ? 3.  After you shampoo, rinse your hair and body thoroughly to remove the  shampoo.                           4.  Use CHG as you would any other liquid soap.  You can apply  chg directly  to the skin and wash  ?                     Gently with a scrungie or clean washcloth. ? 5.  Apply the CHG Soap to your body ONLY FROM THE NECK DOWN.   Do not use on face/ open      ?                     Wound or open sores. Avoid contact with eyes, ears mouth and genitals (private parts).  ?                     Production manager,  Genitals (private parts) with your normal soap. ?            6.  Wash thoroughly, paying special attention to the area where your surgery  will be performed. ? 7.  Thoroughly rinse your body with warm water from the neck down. ? 8.  DO NOT shower/wash with your normal soap after using and rinsing off  the CHG Soap. ?               9.  Pat yourself dry with a clean towel. ?  10.  Wear clean pajamas. ?           11.  Place clean sheets on your bed the night of your first shower and do not  sleep with pets. ?Day of Surgery : ?Do not apply any lotions/deodorants the morning of surgery.  Please wear clean clothes to the hospital/surgery center. ? ?FAILURE TO FOLLOW THESE INSTRUCTIONS MAY RESULT IN THE CANCELLATION OF YOUR SURGERY ?PATIENT SIGNATURE_________________________________ ? ?NURSE SIGNATURE__________________________________ ? ?________________________________________________________________________  ? ? ?           ?

## 2021-06-18 ENCOUNTER — Other Ambulatory Visit: Payer: Self-pay

## 2021-06-18 ENCOUNTER — Encounter (HOSPITAL_COMMUNITY)
Admission: RE | Admit: 2021-06-18 | Discharge: 2021-06-18 | Disposition: A | Discharge status: Home / Self Care | Payer: Medicare Other | Source: Ambulatory Visit | Attending: Surgery | Admitting: Surgery

## 2021-06-18 ENCOUNTER — Encounter (HOSPITAL_COMMUNITY): Payer: Self-pay | Admitting: Surgery

## 2021-06-18 DIAGNOSIS — Z01812 Encounter for preprocedural laboratory examination: Secondary | ICD-10-CM | POA: Insufficient documentation

## 2021-06-18 DIAGNOSIS — Z01818 Encounter for other preprocedural examination: Secondary | ICD-10-CM

## 2021-06-19 ENCOUNTER — Encounter (HOSPITAL_COMMUNITY)
Admission: RE | Admit: 2021-06-19 | Discharge: 2021-06-19 | Disposition: A | Discharge status: Home / Self Care | Payer: Medicare Other | Source: Ambulatory Visit | Attending: Surgery | Admitting: Surgery

## 2021-06-19 DIAGNOSIS — Z01812 Encounter for preprocedural laboratory examination: Secondary | ICD-10-CM | POA: Diagnosis present

## 2021-06-19 DIAGNOSIS — Z01818 Encounter for other preprocedural examination: Secondary | ICD-10-CM

## 2021-06-19 LAB — CBC
HCT: 42.7 % (ref 39.0–52.0)
Hemoglobin: 13.6 g/dL (ref 13.0–17.0)
MCH: 30.7 pg (ref 26.0–34.0)
MCHC: 31.9 g/dL (ref 30.0–36.0)
MCV: 96.4 fL (ref 80.0–100.0)
Platelets: 146 10*3/uL — ABNORMAL LOW (ref 150–400)
RBC: 4.43 MIL/uL (ref 4.22–5.81)
RDW: 14.2 % (ref 11.5–15.5)
WBC: 4.6 10*3/uL (ref 4.0–10.5)
nRBC: 0 % (ref 0.0–0.2)

## 2021-06-19 LAB — BASIC METABOLIC PANEL
Anion gap: 4 — ABNORMAL LOW (ref 5–15)
BUN: 17 mg/dL (ref 8–23)
CO2: 30 mmol/L (ref 22–32)
Calcium: 9.2 mg/dL (ref 8.9–10.3)
Chloride: 108 mmol/L (ref 98–111)
Creatinine, Ser: 1.17 mg/dL (ref 0.61–1.24)
GFR, Estimated: >60 mL/min (ref >60)
Glucose, Bld: 97 mg/dL (ref 70–99)
Potassium: 4.1 mmol/L (ref 3.5–5.1)
Sodium: 142 mmol/L (ref 135–145)

## 2021-06-20 ENCOUNTER — Ambulatory Visit (HOSPITAL_COMMUNITY)
Admission: RE | Admit: 2021-06-20 | Discharge: 2021-06-20 | Disposition: A | Discharge status: Home / Self Care | Payer: Medicare Other | Attending: Surgery | Admitting: Surgery

## 2021-06-20 ENCOUNTER — Ambulatory Visit (HOSPITAL_COMMUNITY): Payer: Medicare Other | Admitting: Physician Assistant

## 2021-06-20 ENCOUNTER — Encounter (HOSPITAL_COMMUNITY): Payer: Self-pay | Admitting: Surgery

## 2021-06-20 ENCOUNTER — Other Ambulatory Visit: Payer: Self-pay

## 2021-06-20 ENCOUNTER — Encounter (HOSPITAL_COMMUNITY)
Admission: RE | Disposition: A | Discharge status: Home / Self Care | Payer: Self-pay | Source: Home / Self Care | Attending: Surgery

## 2021-06-20 ENCOUNTER — Ambulatory Visit (HOSPITAL_BASED_OUTPATIENT_CLINIC_OR_DEPARTMENT_OTHER): Payer: Medicare Other | Admitting: Certified Registered Nurse Anesthetist

## 2021-06-20 DIAGNOSIS — I130 Hypertensive heart and chronic kidney disease with heart failure and stage 1 through stage 4 chronic kidney disease, or unspecified chronic kidney disease: Secondary | ICD-10-CM | POA: Diagnosis not present

## 2021-06-20 DIAGNOSIS — D176 Benign lipomatous neoplasm of spermatic cord: Secondary | ICD-10-CM | POA: Diagnosis not present

## 2021-06-20 DIAGNOSIS — Z7901 Long term (current) use of anticoagulants: Secondary | ICD-10-CM | POA: Diagnosis not present

## 2021-06-20 DIAGNOSIS — Z6833 Body mass index (BMI) 33.0-33.9, adult: Secondary | ICD-10-CM | POA: Diagnosis not present

## 2021-06-20 DIAGNOSIS — I4891 Unspecified atrial fibrillation: Secondary | ICD-10-CM

## 2021-06-20 DIAGNOSIS — N189 Chronic kidney disease, unspecified: Secondary | ICD-10-CM | POA: Diagnosis not present

## 2021-06-20 DIAGNOSIS — I509 Heart failure, unspecified: Secondary | ICD-10-CM | POA: Insufficient documentation

## 2021-06-20 DIAGNOSIS — I110 Hypertensive heart disease with heart failure: Secondary | ICD-10-CM | POA: Diagnosis not present

## 2021-06-20 DIAGNOSIS — E669 Obesity, unspecified: Secondary | ICD-10-CM | POA: Insufficient documentation

## 2021-06-20 DIAGNOSIS — Z79899 Other long term (current) drug therapy: Secondary | ICD-10-CM | POA: Insufficient documentation

## 2021-06-20 DIAGNOSIS — K4020 Bilateral inguinal hernia, without obstruction or gangrene, not specified as recurrent: Secondary | ICD-10-CM

## 2021-06-20 HISTORY — PX: INGUINAL HERNIA REPAIR: SHX194

## 2021-06-20 SURGERY — REPAIR, HERNIA, INGUINAL, LAPAROSCOPIC
Anesthesia: General | Laterality: Right

## 2021-06-20 MED ORDER — ONDANSETRON HCL 4 MG/2ML IJ SOLN
INTRAMUSCULAR | Status: DC | PRN
Start: 2021-06-20 — End: 2021-06-20
  Administered 2021-06-20: 4 mg via INTRAVENOUS

## 2021-06-20 MED ORDER — GABAPENTIN 300 MG PO CAPS
300.0000 mg | ORAL_CAPSULE | ORAL | Status: AC
  Administered 2021-06-20: 300 mg via ORAL
  Filled 2021-06-20: qty 1

## 2021-06-20 MED ORDER — MIDAZOLAM HCL 2 MG/2ML IJ SOLN: 0.5000 mg | Freq: Once | INTRAMUSCULAR | Status: DC | PRN

## 2021-06-20 MED ORDER — BUPIVACAINE-EPINEPHRINE (PF) 0.25% -1:200000 IJ SOLN
INTRAMUSCULAR | Status: AC
  Filled 2021-06-20: qty 30

## 2021-06-20 MED ORDER — OXYCODONE HCL 5 MG/5ML PO SOLN: 5.0000 mg | Freq: Once | ORAL | Status: AC | PRN

## 2021-06-20 MED ORDER — SUGAMMADEX SODIUM 500 MG/5ML IV SOLN
INTRAVENOUS | Status: DC | PRN
  Administered 2021-06-20: 224.4 mg via INTRAVENOUS

## 2021-06-20 MED ORDER — PHENYLEPHRINE HCL (PRESSORS) 10 MG/ML IV SOLN
INTRAVENOUS | Status: AC
  Filled 2021-06-20: qty 1

## 2021-06-20 MED ORDER — BUPIVACAINE LIPOSOME 1.3 % IJ SUSP
INTRAMUSCULAR | Status: AC
  Filled 2021-06-20: qty 20

## 2021-06-20 MED ORDER — 0.9 % SODIUM CHLORIDE (POUR BTL) OPTIME
TOPICAL | Status: DC | PRN
  Administered 2021-06-20: 1000 mL

## 2021-06-20 MED ORDER — MEPERIDINE HCL 50 MG/ML IJ SOLN: 6.2500 mg | INTRAMUSCULAR | Status: DC | PRN

## 2021-06-20 MED ORDER — ORAL CARE MOUTH RINSE: 15.0000 mL | Freq: Once | OROMUCOSAL | Status: AC

## 2021-06-20 MED ORDER — ONDANSETRON HCL 4 MG/2ML IJ SOLN
INTRAMUSCULAR | Status: AC
  Filled 2021-06-20: qty 2

## 2021-06-20 MED ORDER — ROCURONIUM BROMIDE 10 MG/ML (PF) SYRINGE
PREFILLED_SYRINGE | INTRAVENOUS | Status: AC
  Filled 2021-06-20: qty 10

## 2021-06-20 MED ORDER — BUPIVACAINE LIPOSOME 1.3 % IJ SUSP: 20.0000 mL | Freq: Once | INTRAMUSCULAR | Status: DC

## 2021-06-20 MED ORDER — ACETAMINOPHEN 500 MG PO TABS: 1000.0000 mg | ORAL_TABLET | Freq: Once | ORAL | Status: DC

## 2021-06-20 MED ORDER — DEXAMETHASONE SODIUM PHOSPHATE 10 MG/ML IJ SOLN
INTRAMUSCULAR | Status: AC
  Filled 2021-06-20: qty 1

## 2021-06-20 MED ORDER — SUGAMMADEX SODIUM 500 MG/5ML IV SOLN
INTRAVENOUS | Status: AC
  Filled 2021-06-20: qty 5

## 2021-06-20 MED ORDER — LACTATED RINGERS IR SOLN
Status: DC | PRN
  Administered 2021-06-20: 1000 mL

## 2021-06-20 MED ORDER — CHLORHEXIDINE GLUCONATE CLOTH 2 % EX PADS: 6.0000 | MEDICATED_PAD | Freq: Once | CUTANEOUS | Status: DC

## 2021-06-20 MED ORDER — ENSURE PRE-SURGERY PO LIQD
296.0000 mL | Freq: Once | ORAL | Status: DC
  Filled 2021-06-20: qty 296

## 2021-06-20 MED ORDER — STERILE WATER FOR IRRIGATION IR SOLN
Status: DC | PRN
  Administered 2021-06-20: 1000 mL

## 2021-06-20 MED ORDER — CHLORHEXIDINE GLUCONATE 0.12 % MT SOLN
15.0000 mL | Freq: Once | OROMUCOSAL | Status: AC
  Administered 2021-06-20: 15 mL via OROMUCOSAL

## 2021-06-20 MED ORDER — ACETAMINOPHEN 500 MG PO TABS
1000.0000 mg | ORAL_TABLET | ORAL | Status: AC
  Administered 2021-06-20: 1000 mg via ORAL
  Filled 2021-06-20: qty 2

## 2021-06-20 MED ORDER — CEFAZOLIN SODIUM-DEXTROSE 2-4 GM/100ML-% IV SOLN
2.0000 g | INTRAVENOUS | Status: AC
  Administered 2021-06-20: 2 g via INTRAVENOUS
  Filled 2021-06-20: qty 100

## 2021-06-20 MED ORDER — PROPOFOL 10 MG/ML IV BOLUS
INTRAVENOUS | Status: AC
  Filled 2021-06-20: qty 20

## 2021-06-20 MED ORDER — EPHEDRINE SULFATE-NACL 50-0.9 MG/10ML-% IV SOSY
PREFILLED_SYRINGE | INTRAVENOUS | Status: DC | PRN
  Administered 2021-06-20: 5 mg via INTRAVENOUS

## 2021-06-20 MED ORDER — LACTATED RINGERS IV SOLN: INTRAVENOUS | Status: DC

## 2021-06-20 MED ORDER — DEXAMETHASONE SODIUM PHOSPHATE 10 MG/ML IJ SOLN
INTRAMUSCULAR | Status: DC | PRN
Start: 2021-06-20 — End: 2021-06-20
  Administered 2021-06-20: 8 mg via INTRAVENOUS

## 2021-06-20 MED ORDER — LIDOCAINE HCL (PF) 2 % IJ SOLN
INTRAMUSCULAR | Status: AC
  Filled 2021-06-20: qty 5

## 2021-06-20 MED ORDER — BUPIVACAINE-EPINEPHRINE 0.25% -1:200000 IJ SOLN
INTRAMUSCULAR | Status: DC | PRN
  Administered 2021-06-20: 60 mL

## 2021-06-20 MED ORDER — PROPOFOL 10 MG/ML IV BOLUS
INTRAVENOUS | Status: DC | PRN
  Administered 2021-06-20: 140 mg via INTRAVENOUS

## 2021-06-20 MED ORDER — HYDROMORPHONE HCL 1 MG/ML IJ SOLN: 0.2500 mg | INTRAMUSCULAR | Status: DC | PRN

## 2021-06-20 MED ORDER — OXYCODONE HCL 5 MG PO TABS
ORAL_TABLET | ORAL | Status: AC
  Filled 2021-06-20: qty 1

## 2021-06-20 MED ORDER — FENTANYL CITRATE (PF) 250 MCG/5ML IJ SOLN
INTRAMUSCULAR | Status: AC
  Filled 2021-06-20: qty 5

## 2021-06-20 MED ORDER — LIDOCAINE 2% (20 MG/ML) 5 ML SYRINGE
INTRAMUSCULAR | Status: DC | PRN
  Administered 2021-06-20: 60 mg via INTRAVENOUS

## 2021-06-20 MED ORDER — FENTANYL CITRATE (PF) 100 MCG/2ML IJ SOLN
INTRAMUSCULAR | Status: DC | PRN
  Administered 2021-06-20: 100 ug via INTRAVENOUS
  Administered 2021-06-20 (×3): 50 ug via INTRAVENOUS

## 2021-06-20 MED ORDER — ROCURONIUM BROMIDE 10 MG/ML (PF) SYRINGE
PREFILLED_SYRINGE | INTRAVENOUS | Status: DC | PRN
  Administered 2021-06-20: 10 mg via INTRAVENOUS
  Administered 2021-06-20: 20 mg via INTRAVENOUS
  Administered 2021-06-20: 70 mg via INTRAVENOUS
  Administered 2021-06-20: 10 mg via INTRAVENOUS

## 2021-06-20 MED ORDER — BUPIVACAINE LIPOSOME 1.3 % IJ SUSP
INTRAMUSCULAR | Status: DC | PRN
Start: 2021-06-20 — End: 2021-06-21
  Administered 2021-06-20: 20 mL

## 2021-06-20 MED ORDER — OXYCODONE HCL 5 MG PO TABS
5.0000 mg | ORAL_TABLET | Freq: Once | ORAL | Status: AC | PRN
  Administered 2021-06-20: 5 mg via ORAL

## 2021-06-20 MED ORDER — TRAMADOL HCL 50 MG PO TABS: 50.0000 mg | ORAL_TABLET | Freq: Four times a day (QID) | ORAL | 0 refills | Status: DC | PRN

## 2021-06-20 SURGICAL SUPPLY — 40 items
BAG COUNTER SPONGE SURGICOUNT (BAG) IMPLANT
CABLE HIGH FREQUENCY MONO STRZ (ELECTRODE) ×2 IMPLANT
CHLORAPREP W/TINT 26 (MISCELLANEOUS) ×2 IMPLANT
COVER SURGICAL LIGHT HANDLE (MISCELLANEOUS) ×2 IMPLANT
DEVICE SECURE STRAP 25 ABSORB (INSTRUMENTS) ×1 IMPLANT
DRAPE WARM FLUID 44X44 (DRAPES) ×2 IMPLANT
DRSG TEGADERM 2-3/8X2-3/4 SM (GAUZE/BANDAGES/DRESSINGS) ×4 IMPLANT
DRSG TEGADERM 4X4.75 (GAUZE/BANDAGES/DRESSINGS) ×2 IMPLANT
ELECT REM PT RETURN 15FT ADLT (MISCELLANEOUS) ×2 IMPLANT
GAUZE SPONGE 2X2 8PLY STRL LF (GAUZE/BANDAGES/DRESSINGS) ×1 IMPLANT
GLOVE ECLIPSE 8.0 STRL XLNG CF (GLOVE) ×2 IMPLANT
GLOVE INDICATOR 8.0 STRL GRN (GLOVE) ×2 IMPLANT
GOWN STRL REUS W/ TWL XL LVL3 (GOWN DISPOSABLE) ×3 IMPLANT
GOWN STRL REUS W/TWL XL LVL3 (GOWN DISPOSABLE) ×3
IRRIG SUCT STRYKERFLOW 2 WTIP (MISCELLANEOUS)
IRRIGATION SUCT STRKRFLW 2 WTP (MISCELLANEOUS) IMPLANT
KIT BASIN OR (CUSTOM PROCEDURE TRAY) ×2 IMPLANT
KIT TURNOVER KIT A (KITS) IMPLANT
MARKER SKIN DUAL TIP RULER LAB (MISCELLANEOUS) ×2 IMPLANT
MESH HERNIA 6X6 BARD (Mesh General) IMPLANT
MESH HERNIA BARD 6X6 (Mesh General) ×3 IMPLANT
NDL INSUFFLATION 14GA 120MM (NEEDLE) IMPLANT
NEEDLE INSUFFLATION 14GA 120MM (NEEDLE) IMPLANT
PAD POSITIONING PINK XL (MISCELLANEOUS) ×2 IMPLANT
SCISSORS LAP 5X35 DISP (ENDOMECHANICALS) ×2 IMPLANT
SET TUBE SMOKE EVAC HIGH FLOW (TUBING) ×2 IMPLANT
SLEEVE ADV FIXATION 5X100MM (TROCAR) ×2 IMPLANT
SPIKE FLUID TRANSFER (MISCELLANEOUS) ×2 IMPLANT
SPONGE GAUZE 2X2 STER 10/PKG (GAUZE/BANDAGES/DRESSINGS) ×1
SUT MNCRL AB 4-0 PS2 18 (SUTURE) ×2 IMPLANT
SUT PDS AB 1 CT1 27 (SUTURE) ×4 IMPLANT
SUT VIC AB 2-0 SH 27 (SUTURE)
SUT VIC AB 2-0 SH 27X BRD (SUTURE) IMPLANT
SUT VICRYL 0 UR6 27IN ABS (SUTURE) ×2 IMPLANT
TACKER 5MM HERNIA 3.5CML NAB (ENDOMECHANICALS) IMPLANT
TOWEL OR 17X26 10 PK STRL BLUE (TOWEL DISPOSABLE) ×2 IMPLANT
TOWEL OR NON WOVEN STRL DISP B (DISPOSABLE) ×2 IMPLANT
TRAY LAPAROSCOPIC (CUSTOM PROCEDURE TRAY) ×2 IMPLANT
TROCAR ADV FIXATION 5X100MM (TROCAR) ×2 IMPLANT
TROCAR XCEL BLUNT TIP 100MML (ENDOMECHANICALS) ×2 IMPLANT

## 2021-06-20 NOTE — Transfer of Care (Signed)
Immediate Anesthesia Transfer of Care Note ? ?Patient: Dennis Swanson ? ?Procedure(s) Performed: LAPAROSCOPIC RIGHT AND LEFT INGUINAL HERNIA (Right) ? ?Patient Location: PACU ? ?Anesthesia Type:General ? ?Level of Consciousness: drowsy and patient cooperative ? ?Airway & Oxygen Therapy: Patient Spontanous Breathing and Patient connected to face mask oxygen ? ?Post-op Assessment: Report given to RN and Post -op Vital signs reviewed and stable ? ?Post vital signs: Reviewed and stable ? ?Last Vitals:  ?Vitals Value Taken Time  ?BP    ?Temp    ?Pulse    ?Resp    ?SpO2    ? ? ?Last Pain:  ?Vitals:  ? 06/20/21 1309  ?TempSrc:   ?PainSc: 0-No pain  ?   ? ?  ? ?Complications: No notable events documented. ?

## 2021-06-20 NOTE — H&P (Signed)
?06/20/2021 ? ? ? ?PROVIDER: Liston Thum CUMMINS Vivi Piccirilli, MD ? ?Patient Care Team: ?Pcp, No as PCP - General ?Quaniya Damas, Adrian Saran, MD as Consulting Provider (General Surgery) ?Valerie Roys, MD (Cardiovascular Disease) ?Hurshel Keys, DO (Gastroenterology) ? ?[facility] MRN: [id number] ?DOB: 04-10-1955 ?DATE OF ENCOUNTER: 06/20/2021 ? ?Interval History:  ? ?The patient returns to the office after undergoing a chronically prolapsing anal mass as well as hemorrhoid ligation & pexy and hemorrhoidectomy. 04/17/2021 ? ?Pathology shows prolapsing hyperplastic polyp. No precancerous changes. Benign hemorrhoids. ? ?Patient came in today by himself. Denies really any pain. Not needing narcotics. No bleeding. Not needing a pad. Moving his bowels every day. Minimal drainage. No fevers or chills. In good spirits overall. ? ?He does remember he has those inguinal hernias. Right side noticeable. He would like to try and get it fixed soon as possible. No problems with urination ? ? ? ?Labs, Imaging and Diagnostic Testing: ? ?Located in [place]' section of Epic EMR chart ? ?PRIOR [facility] CLINIC NOTES: ? ?Located in [place]' section of Epic EMR chart ? ?SURGERY NOTES: ? ?Located in [place]' section of Epic EMR chart ? ?POST-OPERATIVE DIAGNOSIS:  ?PROLAPSING ANORECTAL MASS ?HEMORRHOIDS PROLAPSED GRADE 3 & 4 WITH BLEEDING ?  ?PROCEDURE:  ?Excision of prolapsing anorectal mass ?Internal and external hemorrhoidectomy x1 ?Internal hemorrhoidectomy x1 ?Internal hemorrhoidal ligation and pexy ?Anorectal examination under anesthesia ?  ?SURGEON: Adin Hector, MD ? ?OR FINDINGS: Large left posterior chronically prolapsed anorectal mass. Mucosal changes on it that could be suspicious for adenomatous changes versus large chronically prolapsed hemorrhoid. Ligation/pexy/excision done. ? ?Large right anteriolateral posterior internal/external hemorrhoid at least grade 3, probably grade 4. Ligation/pexy/hemorrhoidectomy done.  Inflamed grade 2 right anterior internal hemorrhoid. Internal partial hemorrhoidectomy done. ?  ?No fissure. No fistula. No abscess. No condyloma. No true circumferential rectal prolapse. No pilonidal disease. Moderate perianal pruritus consistent with chronic prolapsing tissue ? ?PATHOLOGY: ? ?SURGICAL PATHOLOGY  ?CASE: [id number]  ?PATIENT: Dennis Swanson  ?Surgical Pathology Report  ? ?Clinical History: Hemorrhoids prolapse grade 4 with bleeding (jmc)  ? ?FINAL MICROSCOPIC DIAGNOSIS:  ? ?A. SOFT TISSUE MASS, LEFT POSTERIOR PROLAPSE, BIOPSY:  ?- Hyperplastic polyp in association with hemorrhoidal tissue, showing  ?changes consistent with prolapse.  No dysplasia or malignancy.  ? ?B. HEMORRHOID, RIGHT LATERAL, HEMORRHOIDECTOMY:  ?- Hemorrhoidal tissue.  No dysplasia or malignancy.  ? ?C. HEMORRHOID, RIGHT ANTERIOR, HEMORRHOIDECTOMY:  ?- Hemorrhoidal tissue.  No dysplasia or malignancy.  ? ?[place] DESCRIPTION:  ? ?A.  Received fresh is a 3.4 x 2.9 x 1.8 cm mass of pink-tan soft tissue.  ?The external surface is inked black and the specimen is serially  ?sectioned revealing a pink-tan, focally hemorrhagic cut surface.  The  ?specimen is entirely submitted in A1-A6.  ? ?B.  Received fresh is a 2.8 x 2.5 x 1.5 cm portion of pink-tan to  ?hemorrhagic membranous tissue.  Sectioning reveals a pink-tan to  ?hemorrhagic cut surface.  A representative cross-section is submitted in  ?1 block.  ? ?C.  Received fresh are 2 pink-tan to hemorrhagic membranous tissue  ?fragments measuring 1.2 and 1.5 cm.  Sectioning reveals pink-tan to  ?hemorrhagic cut surfaces.  Representative sections are submitted in 1  ?block.  ?(KW, 04/18/2021)  ? ?Final Diagnosis performed by Mark Martinique, MD.   Electronically signed  ?04/21/2021  ?Technical component performed at [facility], [place]  ?[address] [place], [place] [address].  ? Professional component performed at [employer]. [facility],  ?  [address]  [place], [place] [address].  ? Immunohistochemistry Technical component (if applicable) was performed  ?at [facility]. [address], [address],  ?[place], [place] [address].   IMMUNOHISTOCHEMISTRY DISCLAIMER (if applicable):  ?Some of these immunohistochemical stains may have been developed and the  ?performance characteristics determine by [facility]. Some  ?may not have been cleared or approved by the U.S. Food and Drug  ?Administration. The FDA has determined that such clearance or approval  ?is not necessary. This test is used for clinical purposes. It should not  ?be regarded as investigational or for research. This laboratory is  ?certified under the Clinical Laboratory Improvement Amendments of 1988  ?(CLIA-88) as qualified to perform high complexity clinical laboratory  ?testing.  The controls stained appropriately.  ? ?Physical Examination:  ? ?There is no height or weight on file to calculate BMI. ? ?Constitutional: Not cachectic. Hygeine adequate.  ?Eyes: Normal extraocular movements. Sclera nonicteric ?Neuro: No major focal sensory defects. No major motor deficits. ?Psych: No severe agitation. No severe anxiety. Judgment & insight Adequate, Oriented x4, ?HENT: Normocephalic, Mucus membranes moist. No thrush.  ?Neck: Supple, No tracheal deviation.  ?Chest: Good respiratory excursion. No audible wheezing ?CV: No major extremity edema ?Ext: No obvious deformity or contracture. Edema: not present. No cyanosis ?Skin: Warm and dry ?Musculoskeletal: Severe joint rigidity not present. Mobility: no assist device moving easily without restrictions ? ?Abdomen: Obese Hernia: Not present. Nontender. Diastasis recti: Not present. Soft. Nondistended.  ? ?Gen: Inguinal hernia: Present at: Right groin. Possible small impulse on left.. Inguinal lymph nodes: without lymphadenopathy.  ? ?Rectal: Perianal skin clear. External hemorrhoidectomy radial incisions closed. Mild separation left lateral  with small tag. . Mildly increased sphincter tone. Minimal drainage. No abscess. ? ? ? ?Assessment and Plan:  ? ?He does have the obvious right inguinal hernia. Possible contralateral left.  ? ?He probably would benefit from surgery to get these fixed at some point. I would want to wait at least 3 months from surgery before considering that June/July summer 2023. He likes the idea of getting it done this summer he does not want to delay beyond that. He has information about this and explained procedure again The anatomy & physiology of the abdominal wall and pelvic floor was discussed. The pathophysiology of hernias in the inguinal and pelvic region was discussed. Natural history risks such as progressive enlargement, pain, incarceration, and strangulation was discussed. Contributors to complications such as smoking, obesity, diabetes, prior surgery, etc were discussed.  ? ?I feel the risks of no intervention will lead to serious problems that outweigh the operative risks; therefore, I recommended surgery to reduce and repair the hernia. I explained laparoscopic techniques with possible need for an open approach. I noted usual use of mesh to patch and/or buttress hernia repair ? ?Risks such as bleeding, infection, abscess, need for further treatment, injury to other organs, need for repair of tissues / organs, stroke, heart attack, death, and other risks were discussed. I noted a good likelihood this will help address the problem. Goals of post-operative recovery were discussed as well. Possibility that this will not correct all symptoms was explained. I stressed the importance of low-impact activity, aggressive pain control, avoiding constipation, & not pushing through pain to minimize risk of post-operative chronic pain or injury. Possibility of reherniation was discussed. We will work to minimize complications.  ? ?An educational handout further explaining the pathology & treatment options was given as well.  Questions  were answered. The patient expresses understanding & wishes to proceed with surgery. ? ? ? ?Adin Hector, MD, FACS, MASCRS ?Esophageal, Gastrointestinal & Colorectal Surgery ?Robotic and Minimally In

## 2021-06-20 NOTE — Anesthesia Postprocedure Evaluation (Signed)
Anesthesia Post Note ? ?Patient: Dennis Swanson ? ?Procedure(s) Performed: LAPAROSCOPIC RIGHT AND LEFT INGUINAL HERNIA (Right) ? ?  ? ?Patient location during evaluation: PACU ?Anesthesia Type: General ?Level of consciousness: awake and alert ?Pain management: pain level controlled ?Vital Signs Assessment: post-procedure vital signs reviewed and stable ?Respiratory status: spontaneous breathing, nonlabored ventilation and respiratory function stable ?Cardiovascular status: stable and blood pressure returned to baseline ?Anesthetic complications: no ? ? ?No notable events documented. ? ?Last Vitals:  ?Vitals:  ? 06/20/21 1845 06/20/21 1900  ?BP: (!) 167/105 (!) 149/91  ?Pulse: 88 90  ?Resp: 20 16  ?Temp:    ?SpO2: 99% 92%  ?  ?Last Pain:  ?Vitals:  ? 06/20/21 1939  ?TempSrc:   ?PainSc: 5   ? ? ?  ?  ?  ?  ?  ?  ? ?Audry Pili ? ? ? ? ?

## 2021-06-20 NOTE — Interval H&P Note (Signed)
History and Physical Interval Note: ? ?06/20/2021 ?3:26 PM ? ?Dennis Swanson  has presented today for surgery, with the diagnosis of RIGHT AND POSSIBLE LEFT INGUINAL HERNIAS.  The various methods of treatment have been discussed with the patient and family. After consideration of risks, benefits and other options for treatment, the patient has consented to  Procedure(s): ?LAPAROSCOPIC RIGHT INGUINAL HERNIA AND POSSIBLE LEFT (Right) as a surgical intervention.  The patient's history has been reviewed, patient examined, no change in status, stable for surgery.  I have reviewed the patient's chart and labs.  Questions were answered to the patient's satisfaction.   ? ?I have re-reviewed the the patient's records, history, medications, and allergies.  I have re-examined the patient.  I again discussed intraoperative plans and goals of post-operative recovery.  The patient agrees to proceed. ? ?Dennis Swanson  ?Mar 24, 1955 ?[id number] ? ?Patient Care Team: ?Monico Blitz, MD as PCP - General (Internal Medicine) ?Arnoldo Lenis, MD as PCP - Cardiology (Cardiology) ?Fields, Marga Melnick, MD (Inactive) as Attending Physician (Gastroenterology) ?Michael Boston, MD as Consulting Physician (General Surgery) ?Eloise Harman, DO as Consulting Physician (Gastroenterology) ?Arnoldo Lenis, MD as Consulting Physician (Cardiology) ? ?Patient Active Problem List  ? Diagnosis Date Noted  ? Hypoxia 04/18/2021  ? S/P hemorrhoidectomy 04/17/2021  ? Liver lesion 03/20/2021  ? Iron deficiency anemia 03/20/2021  ? Aortic atherosclerosis ([place]) 03/13/2021  ? Acute hypoxemic respiratory failure ([place]) 09/22/2020  ? Elevated troponin 09/22/2020  ? Acute CHF (congestive heart failure) ([place]) 09/21/2020  ? Mass of anus   ? Symptomatic anemia 09/14/2020  ? CKD (chronic kidney disease) stage 3, GFR 30-59 ml/min (HCC) 09/14/2020  ? Systolic and diastolic CHF, chronic ([place]) 09/14/2020  ? Proteinuria   ? Gastrointestinal hemorrhage   ? Anemia  09/13/2020  ? Chronic diastolic CHF (congestive heart failure) ([place]) 09/13/2020  ? Obesity (BMI 30.0-34.9) 09/13/2020  ? Rectal bleeding 02/24/2017  ? Morbid obesity ([place]) 02/24/2017  ? Acute exacerbation of CHF (congestive heart failure) ([place]) 12/06/2015  ? Encounter for therapeutic drug monitoring 04/10/2015  ? Atrial fibrillation ([place]) 04/03/2015  ? Nonischemic cardiomyopathy ([place]) 04/03/2015  ? Current use of long term anticoagulation 04/03/2015  ? Colon cancer screening 04/27/2012  ? Essential hypertension 04/27/2012  ? Congestive heart failure ([place]) 12/17/2009  ? Alcohol use disorder, severe, in early remission ([place]) 09/27/2009  ? ? ?Past Medical History:  ?Diagnosis Date  ? Acute pulmonary embolism ([place]) 02/16/2009  ? Alcohol abuse   ? Atrial flutter ([place])   ? CHF (congestive heart failure) ([place])   ? a. EF 40-45% in 2011 b. EF at 60-65% in 04/2015 c. EF at 45-50% by echo in 09/2020  ? Dysrhythmia   ? Hypertension   ? Lower extremity cellulitis   ? bilateral  ? Systolic dysfunction   ? ? ?Past Surgical History:  ?Procedure Laterality Date  ? COLONOSCOPY N/A 05/17/2012  ? Procedure: COLONOSCOPY;  Surgeon: Danie Binder, MD;  Location: [facility];  Service: Endoscopy;  Laterality: N/A;  10:30  ? COLONOSCOPY WITH PROPOFOL N/A 09/15/2020  ? Surgeon: Hurshel Keys K, DO;  2 cm rubbery textured nodular anal mass, internal hemorrhoids, external hemorrhoids, sigmoid and descending diverticula, redundant colon.  Pathology revealed polypoid rectal mucosa with acute and chronic inflammation with erosion, granular hyperplasia and macular isolation of lamina propria, consistent with mucosal prolapse.  ? ESOPHAGOGASTRODUODENOSCOPY (EGD) WITH PROPOFOL N/A 09/15/2020  ? Surgeon: Eloise Harman, DO; Small hiatal hernia, gastritis biopsied, normal examined  duodenum.  Biopsy with no specific histopathologic changes, negative for H. pylori.  ? EVALUATION UNDER ANESTHESIA WITH HEMORRHOIDECTOMY N/A 04/17/2021  ? Procedure:  HEMORRHOIDECTOMY WITH LIGATION AND HEMORRHOIDOPEXY, ANORECTAL EXAMINATION UNDER ANESTHESIA, EXCISION OF PROLAPSE ANAL MASS, EXCISON OF GRADE 3 AND 4 PROLAPSING HEMORRHOIDS;  Surgeon: Michael Boston, MD;  Location: [facility];  Service: General;  Laterality: N/A;  ? None    ? TONSILLECTOMY    ? ? ?Social History  ? ?Socioeconomic History  ? Marital status: Single  ?  Spouse name: Not on file  ? Number of children: Not on file  ? Years of education: Not on file  ? Highest education level: Not on file  ?Occupational History  ? 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 Smoking status: Never  ? Smokeless tobacco: Never  ?Vaping Use  ? Vaping Use: Never used  ?Substance and Sexual Activity  ? Alcohol use: Yes  ?  Alcohol/week: 3.0 standard drinks  ?  Types: 3 Cans of beer per week  ?  Comment: 1-2 beer a week  ? Drug use: No  ? Sexual activity: Not Currently  ?Other Topics Concern  ? Not on file  ?Social History Narrative  ? Lives alone, 2 daughters  ? No regular exercise  ? ?Social Determinants of Health  ? ?Financial Resource Strain: Low Risk   ? Difficulty of Paying Living Expenses: Not very hard  ?Food Insecurity: No Food Insecurity  ? Worried About Charity fundraiser in the Last Year: Never true  ? Ran Out of Food in the Last Year: Never true  ?Transportation Needs: No Transportation Needs  ? Lack of Transportation (Medical): No  ? Lack of Transportation (Non-Medical): No  ?Physical Activity: Inactive  ? Days of Exercise per Week: 0 days  ? Minutes of Exercise per Session: 0 min  ?Stress: Not on file  ?Social Connections: Not on file  ?Intimate Partner Violence: Not on file  ? ? ?Family History  ?Problem Relation Age of Onset  ? Heart attack Mother   ? Hypertension Brother   ? Diabetes Brother   ? Cancer Father   ? Colon cancer Neg Hx   ? ? ?Medications Prior to Admission  ?Medication Sig Dispense Refill Last Dose  ? carvedilol (COREG) 6.25 MG tablet Take 1 tablet (6.25 mg total) by  mouth 2 (two) times daily with a meal. 60 tablet 1 06/19/2021 at 0700  ? diazepam (VALIUM) 5 MG tablet Take 1 tablet (5 mg total) by mouth every 8 (eight) hours as needed for muscle spasms (difficulty urinating). 6 tablet 1 few days ago  ? furosemide (LASIX) 40 MG tablet Take 1 tablet (40 mg total) by mouth 2 (two) times daily. 180 tablet 2 06/19/2021 at am  ? pantoprazole (PROTONIX) 40 MG tablet Take 1 tablet (40 mg total) by mouth daily. 30 tablet 1 06/19/2021 at am  ? sacubitril-valsartan (ENTRESTO) 24-26 MG Take 1 tablet by mouth 2 (two) times daily. 60 tablet 11 06/19/2021 at am  ? spironolactone (ALDACTONE) 25 MG tablet Take 1 tablet (25 mg total) by mouth daily. 90 tablet 2 06/19/2021 at am  ? apixaban (ELIQUIS) 5 MG TABS tablet Take 1 tablet (5 mg total) by mouth 2 (two) times daily. (Patient not taking: Reported on 06/20/2021) 60 tablet 1 Not Taking  ? oxyCODONE (OXY IR/ROXICODONE) 5 MG immediate release tablet Take 1 tablet (5 mg total) by mouth every 6 (six) hours as needed for severe  pain or breakthrough pain. (Patient not taking: Reported on 06/20/2021) 30 tablet 0 Not Taking  ? ? ?Current Facility-Administered Medications  ?Medication Dose Route Frequency Provider Last Rate Last Admin  ? bupivacaine liposome (EXPAREL) 1.3 % injection 266 mg  20 mL Infiltration Once Michael Boston, MD      ? ceFAZolin (ANCEF) IVPB 2g/100 mL premix  2 g Intravenous On Call to OR Michael Boston, MD      ? Chlorhexidine Gluconate Cloth 2 % PADS 6 each  6 each Topical Once Michael Boston, MD      ? And  ? Chlorhexidine Gluconate Cloth 2 % PADS 6 each  6 each Topical Once Michael Boston, MD      ? Derrill Memo ON 06/21/2021] feeding supplement (ENSURE PRE-SURGERY) liquid 296 mL  296 mL Oral Once Michael Boston, MD      ? lactated ringers infusion   Intravenous Continuous Ellender, Karyl Kinnier, MD 10 mL/hr at 06/20/21 1431 Continued from Pre-op at 06/20/21 1431  ?  ? ?No Known Allergies ? ?BP (!) 164/104 Comment: left arm  Pulse 81   Temp 98 ?F (36.7 ?C)  (Oral)   Resp 15   Ht 6' (1.829 m)   Wt 112.2 kg   SpO2 95%   BMI 33.55 kg/m?  ? ?Labs: ?Results for orders placed or performed during the hospital encounter of 06/19/21 (from the past 48 hour(s))  ?Basic m

## 2021-06-20 NOTE — Anesthesia Procedure Notes (Signed)
Procedure Name: Intubation ?Date/Time: 06/20/2021 4:00 PM ?Performed by: Montel Clock, CRNA ?Pre-anesthesia Checklist: Patient identified, Emergency Drugs available, Suction available, Patient being monitored and Timeout performed ?Patient Re-evaluated:Patient Re-evaluated prior to induction ?Oxygen Delivery Method: Circle system utilized ?Preoxygenation: Pre-oxygenation with 100% oxygen ?Induction Type: IV induction ?Ventilation: Mask ventilation without difficulty and Oral airway inserted - appropriate to patient size ?Laryngoscope Size: Mac and 4 ?Grade View: Grade I ?Tube type: Oral ?Tube size: 7.5 mm ?Number of attempts: 1 ?Airway Equipment and Method: Stylet ?Placement Confirmation: ETT inserted through vocal cords under direct vision, positive ETCO2 and breath sounds checked- equal and bilateral ?Secured at: 23 cm ?Tube secured with: Tape ?Dental Injury: Teeth and Oropharynx as per pre-operative assessment  ? ? ? ? ?

## 2021-06-20 NOTE — Anesthesia Preprocedure Evaluation (Addendum)
Anesthesia Evaluation  ?Patient identified by MRN, date of birth, ID band ?Patient awake ? ? ? ?Reviewed: ?Allergy & Precautions, NPO status , Patient's Chart, lab work & pertinent test results ? ?History of Anesthesia Complications ?Negative for: history of anesthetic complications ? ?Airway ?Mallampati: II ? ?TM Distance: >3 FB ?Neck ROM: Full ? ? ? Dental ? ?(+) Poor Dentition, Dental Advisory Given, Missing ?  ?Pulmonary ?neg pulmonary ROS,  ?  ?breath sounds clear to auscultation ? ? ? ? ? ? Cardiovascular ?hypertension, Pt. on medications ?(-) angina+CHF Delene Loll)  ?+ dysrhythmias Atrial Fibrillation  ?Rhythm:Irregular Rate:Normal ? ?08/2020 ECHO: EF 45-50%, low normal LVF, mod LVH, severely reduced RVF, RV dilation, severe TR, mild MR ?  ?Neuro/Psych ?negative neurological ROS ?   ? GI/Hepatic ?GERD  Medicated and Controlled,(+)  ?  ? substance abuse ? alcohol use,   ?Endo/Other  ?obese ? Renal/GU ?Renal InsufficiencyRenal disease  ? ?  ?Musculoskeletal ? ? Abdominal ?(+) + obese,   ?Peds ? Hematology ?plt 146k, Hb 13.6 ?Eliquis   ?Anesthesia Other Findings ? ? Reproductive/Obstetrics ? ?  ? ? ? ? ? ? ? ? ? ? ? ? ? ?  ?  ? ? ? ? ? ? ? ?Anesthesia Physical ?Anesthesia Plan ? ?ASA: 3 ? ?Anesthesia Plan: General  ? ?Post-op Pain Management: Tylenol PO (pre-op)*  ? ?Induction:  ? ?PONV Risk Score and Plan: 2 and Ondansetron and Dexamethasone ? ?Airway Management Planned: Oral ETT ? ?Additional Equipment:  ? ?Intra-op Plan:  ? ?Post-operative Plan: Extubation in OR ? ?Informed Consent: I have reviewed the patients History and Physical, chart, labs and discussed the procedure including the risks, benefits and alternatives for the proposed anesthesia with the patient or authorized representative who has indicated his/her understanding and acceptance.  ? ? ? ?Dental advisory given ? ?Plan Discussed with: CRNA and Surgeon ? ?Anesthesia Plan Comments: (ClearSight BP monitoring)   ? ? ? ? ? ?Anesthesia Quick Evaluation ? ?

## 2021-06-20 NOTE — Op Note (Signed)
06/20/2021 ? ?6:11 PM ? ?PATIENT:  Dennis Swanson  66 y.o. male ? ?Patient Care Team: ?Monico Blitz, MD as PCP - General (Internal Medicine) ?Arnoldo Lenis, MD as PCP - Cardiology (Cardiology) ?Fields, Marga Melnick, MD (Inactive) as Attending Physician (Gastroenterology) ?Michael Boston, MD as Consulting Physician (General Surgery) ?Eloise Harman, DO as Consulting Physician (Gastroenterology) ?Arnoldo Lenis, MD as Consulting Physician (Cardiology) ? ?PRE-OPERATIVE DIAGNOSIS:  RIGHT AND POSSIBLE LEFT INGUINAL HERNIAS ? ?POST-OPERATIVE DIAGNOSIS:  BILATERAL INGUINAL HERNIAS ? ?PROCEDURE:   ?LAPAROSCOPIC BILATERAL INGUINAL HERNIA REPAIR ?TRANSVERSUS ABDOMINIS PLANE (TAP) BLOCK - BILATERAL ? ?SURGEON:  Adin Hector, MD ? ?ASSISTANT: None ? ?ANESTHESIA:    ? ?Regional ilioinguinal and genitofemoral and spermatic cord nerve blocks  ?General ? ?Regional TRANSVERSUS ABDOMINIS PLANE (TAP) nerve block for perioperative & postoperative pain control provided with liposomal bupivacaine (Experel) mixed with 0.25% bupivacaine as a Bilateral TAP block x 58m each side at the level of the transverse abdominis & preperitoneal spaces along the flank at the anterior axillary line, from subcostal ridge to iliac crest under laparoscopic guidance  ? ? ?EBL:  Total I/O ?In: 1000 [I.V.:1000] ?Out: 50 [Blood:50].  See anesthesia record ? ?Delay start of Pharmacological VTE agent (>24hrs) due to surgical blood loss or risk of bleeding:  no ? ?DRAINS: NONE ? ?SPECIMEN:  NONE ? ?DISPOSITION OF SPECIMEN:  N/A ? ?COUNTS:  YES ? ?PLAN OF CARE: Discharge to home after PACU ? ?PATIENT DISPOSITION:  PACU - hemodynamically stable. ? ?INDICATION: Pleasant gentleman who has a obvious right inguinal hernia going down to the upper scrotum.  Small but definite fat-containing left inguinal hernia as well.  I recommended exploration and repair of hernias found. ? ?The anatomy & physiology of the abdominal wall and pelvic floor was discussed.   The pathophysiology of hernias in the inguinal and pelvic region was discussed.  Natural history risks such as progressive enlargement, pain, incarceration & strangulation was discussed.   Contributors to complications such as smoking, obesity, diabetes, prior surgery, etc were discussed.   ? ?I feel the risks of no intervention will lead to serious problems that outweigh the operative risks; therefore, I recommended surgery to reduce and repair the hernia.  I explained laparoscopic techniques with possible need for an open approach.  I noted usual use of mesh to patch and/or buttress hernia repair ? ?Risks such as bleeding, infection, abscess, need for further treatment, heart attack, death, and other risks were discussed.  I noted a good likelihood this will help address the problem.   Goals of post-operative recovery were discussed as well.  Possibility that this will not correct all symptoms was explained.  I stressed the importance of low-impact activity, aggressive pain control, avoiding constipation, & not pushing through pain to minimize risk of post-operative chronic pain or injury. Possibility of reherniation was discussed.  We will work to minimize complications.    ? ?An educational handout further explaining the pathology & treatment options was given as well.  Questions were answered.  The patient expresses understanding & wishes to proceed with surgery. ? ?OR FINDINGS: On the right side, large indirect inguinal hernia.  Had some colon within it but reducible.  No definite direct femoral or obturator hernias. ? ?The left side, moderate sized indirect inguinal hernia containing large spermatic cord lipomas.  No obvious direct space, femoral, obturator hernias ? ?DESCRIPTION: ? ?The patient was identified & brought into the operating room. The patient was positioned supine with arms tucked.  SCDs were active during the entire case. The patient underwent general anesthesia without any difficulty.  The  abdomen was prepped and draped in a sterile fashion. The patient's bladder was emptied.  A Surgical Timeout confirmed our plan. ? ?I made a transverse incision through the inferior umbilical fold.  I made a small transverse nick through the anterior rectus fascia contralateral to the inguinal hernia side and placed a 0-vicryl stitch through the fascia.  I placed a Hasson trocar into the preperitoneal plane.  Entry was clean.  We induced carbon dioxide insufflation. Camera inspection revealed no injury.  I used a 15m angled scope to bluntly free the peritoneum off the infraumbilical anterior abdominal wall.  I created enough of a preperitoneal pocket to place 531mports into the right & left mid-abdomen into this preperitoneal cavity. ? ?I focused attention on the RIGHT pelvis since that was the dominant hernia side.   I used blunt & focused sharp dissection to free the peritoneum off the flank and down to the pubic rim.  I freed the anteriolateral bladder wall off the anteriolateral pelvic wall, sparing midline attachments.   I located a swath of peritoneum going into a hernia fascial defect at the  internal ring consistent with  an indirect inguinal hernia..  I gradually freed the peritoneal hernia sac off safely and reduced it into the preperitoneal space.  I freed the peritoneum off the spermatic vessels & vas deferens.  I freed peritoneum off the retroperitoneum along the psoas muscle.  Spermatic cord lipoma was dissected away & removed.  I checked & assured hemostasis.    ? ?I turned attention on the opposite  LEFT pelvis.  I did dissection in a similar, mirror-image fashion. The patient had an indirect inguinal hernia.. Marland Kitchen Spermatic cord lipoma was dissected away & removed.    I checked & assured hemostasis.    ? ?I chose sheets of medium-weight polypropylene Bard Marlex 15x15cm, one for each side.  I cut a single sigmoid-shaped slit ~6cm from a corner of each mesh.  I placed the meshes into the preperitoneal  space & laid them as overlapping diamonds such that at the inferior points, a 6x6 cm corner flap rested in the true anterolateral pelvis, covering the obturator & femoral foramina.   I allowed the bladder to return to the pubis, this helping tuck the corners of the mesh in the anteriolateral pelvis.  The medial corners overlapped each other across midline cephalad to the pubic rim.   Given the numerous hernias of moderate size, I placed a third 15x15cm mesh in the center as a vertical diamond.  The lateral wings of the mesh overlap across the direct spaces and internal rings where the dominant hernias were.  This provided good coverage and reinforcement of the hernia repairs.  Because of the large hernias, I did use an absorbable secure strap tacker above the inguinal ligaments and suprapubic rims and lateral flanks to help tack the mesh.  Also along the midline.  Stayed away from the retroperitoneum.  ? ?I held the hernia sacs cephalad & evacuated carbon dioxide.  I closed the fascia with absorbable suture.  I closed the skin using 4-0 monocryl stitch.  Sterile dressings were applied.  ? ?The patient was extubated & arrived in the PACU in stable condition..  I had discussed postoperative care with the patient in the holding area.  Instructions are written in the chart.  I discussed operative findings, updated the patient's status, discussed probable  steps to recovery, and gave postoperative recommendations to the  patient's niece, Alfreda Hath .  Recommendations were made.  Questions were answered.  She expressed understanding & appreciation. ? ? ?Adin Hector, M.D., F.A.C.S. ?Gastrointestinal and Minimally Invasive Surgery ?[facility] ?[address], [address] ?[place], [place] [address] ?([phone] Main / Paging ? ?06/20/2021 ?6:11 PM  ?

## 2021-06-20 NOTE — Discharge Instructions (Signed)
HERNIA REPAIR: POST OP INSTRUCTIONS  ######################################################################  EAT Gradually transition to a high fiber diet with a fiber supplement over the next few weeks after discharge.  Start with a pureed / full liquid diet (see below)  WALK Walk an hour a day.  Control your pain to do that.    CONTROL PAIN Control pain so that you can walk, sleep, tolerate sneezing/coughing, and go up/down stairs.  HAVE A BOWEL MOVEMENT DAILY Keep your bowels regular to avoid problems.  OK to try a laxative to override constipation.  OK to use an antidairrheal to slow down diarrhea.  Call if not better after 2 tries  CALL IF YOU HAVE PROBLEMS/CONCERNS Call if you are still struggling despite following these instructions. Call if you have concerns not answered by these instructions  ######################################################################    DIET: Follow a light bland diet & liquids the first 24 hours after arrival home, such as soup, liquids, starches, etc.  Be sure to drink plenty of fluids.  Quickly advance to a usual solid diet within a few days.  Avoid fast food or heavy meals as your are more likely to get nauseated or have irregular bowels.  A low-fat, high-fiber diet for the rest of your life is ideal.   Take your usually prescribed home medications unless otherwise directed.  PAIN CONTROL: Pain is best controlled by a usual combination of three different methods TOGETHER: Ice/Heat Over the counter pain medication Prescription pain medication Most patients will experience some swelling and bruising around the hernia(s) such as the bellybutton, groins, or old incisions.  Ice packs or heating pads (30-60 minutes up to 6 times a day) will help. Use ice for the first few days to help decrease swelling and bruising, then switch to heat to help relax tight/sore spots and speed recovery.  Some people prefer to use ice alone, heat alone, alternating  between ice & heat.  Experiment to what works for you.  Swelling and bruising can take several weeks to resolve.   It is helpful to take an over-the-counter pain medication regularly for the first few weeks.  Choose one of the following that works best for you: Naproxen (Aleve, etc)  Two 220mg tabs twice a day Ibuprofen (Advil, etc) Three 200mg tabs four times a day (every meal & bedtime) Acetaminophen (Tylenol, etc) 325-650mg four times a day (every meal & bedtime) A  prescription for pain medication should be given to you upon discharge.  Take your pain medication as prescribed.  If you are having problems/concerns with the prescription medicine (does not control pain, nausea, vomiting, rash, itching, etc), please call us [phone] to see if we need to switch you to a different pain medicine that will work better for you and/or control your side effect better. If you need a refill on your pain medication, please contact your pharmacy.  They will contact our office to request authorization. Prescriptions will not be filled after 5 pm or on week-ends.  Avoid getting constipated.  Between the surgery and the pain medications, it is common to experience some constipation.  Increasing fluid intake and taking a fiber supplement (such as Metamucil, Citrucel, FiberCon, MiraLax, etc) 1-2 times a day regularly will usually help prevent this problem from occurring.  A mild laxative (prune juice, Milk of Magnesia, MiraLax, etc) should be taken according to package directions if there are no bowel movements after 48 hours.    Wash / shower every day.  You may shower over the dressings   as they are waterproof.    It is good for closed incisions and even open wounds to be washed every day.  Shower every day.  Short baths are fine.  Wash the incisions and wounds clean with soap & water.    You may leave closed incisions open to air if it is dry.   You may cover the incision with clean gauze & replace it after  your daily shower for comfort.  TEGADERM:  You have clear gauze band-aid dressings over your closed incision(s).  Remove the dressings 3 days after surgery.    ACTIVITIES as tolerated:   You may resume regular (light) daily activities beginning the next day--such as daily self-care, walking, climbing stairs--gradually increasing activities as tolerated.  Control your pain so that you can walk an hour a day.  If you can walk 30 minutes without difficulty, it is safe to try more intense activity such as jogging, treadmill, bicycling, low-impact aerobics, swimming, etc. Save the most intensive and strenuous activity for last such as sit-ups, heavy lifting, contact sports, etc  Refrain from any heavy lifting or straining until you are off narcotics for pain control.   DO NOT PUSH THROUGH PAIN.  Let pain be your guide: If it hurts to do something, don't do it.  Pain is your body warning you to avoid that activity for another week until the pain goes down. You may drive when you are no longer taking prescription pain medication, you can comfortably wear a seatbelt, and you can safely maneuver your car and apply brakes. You may have sexual intercourse when it is comfortable.   FOLLOW UP in our office Please call [facility] at [phone] to set up an appointment to see your surgeon in the office for a follow-up appointment approximately 2-3 weeks after your surgery. Make sure that you call for this appointment the day you arrive home to insure a convenient appointment time.  9.  If you have disability of FMLA / Family leave forms, please bring the forms to the office for processing.  (do not give to your surgeon).  WHEN TO CALL US [phone]: Poor pain control Reactions / problems with new medications (rash/itching, nausea, etc)  Fever over 101.5 F (38.5 C) Inability to urinate Nausea and/or vomiting Worsening swelling or bruising Continued bleeding from incision. Increased pain, redness, or  drainage from the incision   The clinic staff is available to answer your questions during regular business hours (8:30am-5pm).  Please don't hesitate to call and ask to speak to one of our nurses for clinical concerns.   If you have a medical emergency, go to the nearest emergency room or call 911.  A surgeon from [facility] is always on call at the hospitals in [place]  [facility] [address], [address], [place], [place]  [address] ?  [address] [address], [place], [place]   [address] MAIN: [phone] ? TOLL FREE: [phone] ? FAX: [phone] [email or web address]  

## 2021-06-23 ENCOUNTER — Encounter (HOSPITAL_COMMUNITY): Payer: Self-pay | Admitting: Surgery

## 2021-06-24 ENCOUNTER — Encounter (HOSPITAL_COMMUNITY): Payer: Self-pay | Admitting: Surgery

## 2021-07-15 ENCOUNTER — Other Ambulatory Visit: Payer: Self-pay

## 2021-07-15 MED ORDER — PANTOPRAZOLE SODIUM 40 MG PO TBEC: 40.0000 mg | DELAYED_RELEASE_TABLET | Freq: Every day | ORAL | 1 refills | Status: DC

## 2021-09-01 ENCOUNTER — Encounter: Payer: Self-pay | Admitting: Internal Medicine

## 2021-09-22 ENCOUNTER — Other Ambulatory Visit: Payer: Self-pay | Admitting: Cardiology

## 2021-10-10 ENCOUNTER — Other Ambulatory Visit: Payer: Self-pay | Admitting: Cardiology

## 2021-10-29 ENCOUNTER — Other Ambulatory Visit: Payer: Self-pay | Admitting: Cardiology

## 2021-11-18 ENCOUNTER — Other Ambulatory Visit: Payer: Self-pay | Admitting: Cardiology

## 2021-11-18 NOTE — Progress Notes (Unsigned)
GI Office Note    Referring Provider: Monico Blitz, MD Primary Care Physician:  Monico Blitz, MD  Primary Gastroenterologist: Elon Alas. Abbey Chatters, DO   Chief Complaint   No chief complaint on file.   History of Present Illness   Dennis Swanson is a 66 y.o. male presenting today for follow-up.  Last seen in February 2023.  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  Patient seen by Dr. Johney Maine January 2023. Suspected the mass prolapsing from patient's anus was likely a chronically irritated hemorrhoid.  Recommended hemorrhoidectomy.  He was also noted to have hernias in bilateral groin areas.  Recommended hernia surgery 3 months after hemorrhoid surgery.  Labs from February 2023: Iron 102, TIBC 329, iron saturation is 31%, ferritin 101, hemoglobin 15.1.  MRI liver with and without contrast February 2023 IMPRESSION: 1. The 9 by 7 by 6 mm mildly T2 hyperintense lesion in segment 3 of the liver described on recent CT abdomen is unfortunately substantially obscured on most sequences including post-contrast sequences on today's exam due to motion. However, there is a very faint suggestion that this lesion was probably present on the CT chest from 08/21/2009. Based on the accentuated T2 signal (although not fluid signal) this is most likely a small hemangioma or similar benign lesion. 2. Prior ascites has resolved. 3. Other imaging findings of potential clinical significance: Mild cardiomegaly. Mildly elevated right hemidiaphragm. Bilateral gynecomastia. Small hepatic cysts. Aortic Atherosclerosis (ICD10-I70.0). Mildly redundant sigmoid  colon. Lower thoracic and lumbar spondylosis and degenerative disc disease.   Medications   Current Outpatient Medications  Medication Sig Dispense Refill   pantoprazole (PROTONIX) 40 MG tablet TAKE 1 TABLET BY MOUTH ONCE DAILY . APPOINTMENT REQUIRED FOR FUTURE REFILLS 7 tablet 0   spironolactone (ALDACTONE) 25 MG tablet TAKE 1 TABLET BY MOUTH ONCE DAILY . APPOINTMENT REQUIRED FOR FUTURE REFILLS 15 tablet 0   apixaban (ELIQUIS) 5 MG TABS tablet Take 1 tablet (5 mg total) by mouth 2 (two) times daily. (Patient not taking: Reported on 06/20/2021) 60 tablet 1   carvedilol (COREG) 6.25 MG tablet Take 1 tablet (6.25 mg total) by mouth 2 (two) times daily with a meal. 60 tablet 1   diazepam (VALIUM) 5 MG tablet Take 1 tablet (5 mg total) by mouth every 8 (eight) hours as needed for muscle spasms (difficulty urinating). 6 tablet 1   furosemide (LASIX) 40 MG tablet Take 1 tablet (40 mg total) by mouth 2 (two) times daily. 180 tablet 2   sacubitril-valsartan (ENTRESTO) 24-26 MG Take 1 tablet by mouth 2 (two) times daily. 60 tablet 11   traMADol (ULTRAM) 50 MG tablet Take 1-2 tablets (50-100 mg total) by mouth every 6 (six) hours as needed for moderate pain or severe pain. 20 tablet 0   No current facility-administered medications for this visit.    Allergies   Allergies as of 11/19/2021   (No Known Allergies)     Past Medical History   Past Medical History:  Diagnosis Date   Acute pulmonary embolism ([place]) 02/16/2009   Alcohol abuse    Atrial flutter ([facility])    CHF (congestive heart failure) ([place])  a. EF 40-45% in 2011 b. EF at 60-65% in 04/2015 c. EF at 45-50% by echo in 09/2020   Dysrhythmia    Hypertension    Lower extremity cellulitis    bilateral   Systolic dysfunction     Past Surgical History   Past Surgical History:  Procedure Laterality Date   COLONOSCOPY N/A 05/17/2012   Procedure: COLONOSCOPY;  Surgeon: Danie Binder, MD;  Location: [facility];  Service: Endoscopy;   Laterality: N/A;  10:30   COLONOSCOPY WITH PROPOFOL N/A 09/15/2020   Surgeon: Hurshel Keys K, DO;  2 cm rubbery textured nodular anal mass, internal hemorrhoids, external hemorrhoids, sigmoid and descending diverticula, redundant colon.  Pathology revealed polypoid rectal mucosa with acute and chronic inflammation with erosion, granular hyperplasia and macular isolation of lamina propria, consistent with mucosal prolapse.   ESOPHAGOGASTRODUODENOSCOPY (EGD) WITH PROPOFOL N/A 09/15/2020   Surgeon: Eloise Harman, DO; Small hiatal hernia, gastritis biopsied, normal examined duodenum.  Biopsy with no specific histopathologic changes, negative for H. pylori.   EVALUATION UNDER ANESTHESIA WITH HEMORRHOIDECTOMY N/A 04/17/2021   Procedure: HEMORRHOIDECTOMY WITH LIGATION AND HEMORRHOIDOPEXY, ANORECTAL EXAMINATION UNDER ANESTHESIA, EXCISION OF PROLAPSE ANAL MASS, EXCISON OF GRADE 3 AND 4 PROLAPSING HEMORRHOIDS;  Surgeon: Michael Boston, MD;  Location: [facility];  Service: General;  Laterality: N/A;   INGUINAL HERNIA REPAIR Right 06/20/2021   Procedure: LAPAROSCOPIC RIGHT AND LEFT INGUINAL HERNIA;  Surgeon: Michael Boston, MD;  Location: [facility];  Service: General;  Laterality: Right;   None     TONSILLECTOMY      Past Family History   Family History  Problem Relation Age of Onset   Heart attack Mother    Hypertension Brother    Diabetes Brother    Cancer Father    Colon cancer Neg Hx     Past Social History   Social History   Socioeconomic History   Marital status: Single    Spouse name: Not on file   Number of children: Not on file   Years of education: Not on file   Highest education level: Not on file  Occupational History   Occupation: Unemployed, 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  Tobacco Use   Smoking status: Never   Smokeless tobacco: Never  Vaping Use   Vaping Use: Never used  Substance and Sexual Activity   Alcohol use: Yes    Alcohol/week: 3.0 standard drinks of alcohol     Types: 3 Cans of beer per week    Comment: 1-2 beer a week   Drug use: No   Sexual activity: Not Currently  Other Topics Concern   Not on file  Social History Narrative   Lives alone, 2 daughters   No regular exercise   Social Determinants of Health   Financial Resource Strain: Low Risk  (10/02/2020)   Overall Financial Resource Strain (CARDIA)    Difficulty of Paying Living Expenses: Not very hard  Food Insecurity: No Food Insecurity (10/02/2020)   Hunger Vital Sign    Worried About Running Out of Food in the Last Year: Never true    Ran Out of Food in the Last Year: Never true  Transportation Needs: No Transportation Needs (10/02/2020)   PRAPARE - Hydrologist (Medical): No    Lack of Transportation (Non-Medical): No  Physical Activity: Inactive (10/02/2020)   Exercise Vital Sign    Days of Exercise per Week: 0 days    Minutes of Exercise per Session: 0 min  Stress: Not on file  Social Connections: Not on file  Intimate Partner Violence: Not on file    Review of Systems   General: Negative for anorexia, weight loss, fever, chills, fatigue, weakness. ENT: Negative for hoarseness, difficulty swallowing , nasal congestion. CV: Negative for chest pain, angina, palpitations, dyspnea on exertion, peripheral edema.  Respiratory: Negative for dyspnea at rest, dyspnea on exertion, cough, sputum, wheezing.  GI: See history of present illness. GU:  Negative for dysuria, hematuria, urinary incontinence, urinary frequency, nocturnal urination.  Endo: Negative for unusual weight change.     Physical Exam   There were no vitals taken for this visit.   General: Well-nourished, well-developed in no acute distress.  Eyes: No icterus. Mouth: Oropharyngeal mucosa moist and pink , no lesions erythema or exudate. Lungs: Clear to auscultation bilaterally.  Heart: Regular rate and rhythm, no murmurs rubs or gallops.  Abdomen: Bowel sounds are normal, nontender,  nondistended, no hepatosplenomegaly or masses,  no abdominal bruits or hernia , no rebound or guarding.  Rectal: ***  Extremities: No lower extremity edema. No clubbing or deformities. Neuro: Alert and oriented x 4   Skin: Warm and dry, no jaundice.   Psych: Alert and cooperative, normal mood and affect.  Labs   *** Imaging Studies   No results found.  Assessment       PLAN   ***   Laureen Ochs. Bobby Rumpf, [place], [facility]

## 2021-11-19 ENCOUNTER — Encounter: Payer: Self-pay | Admitting: Gastroenterology

## 2021-11-19 ENCOUNTER — Ambulatory Visit (INDEPENDENT_AMBULATORY_CARE_PROVIDER_SITE_OTHER): Payer: Medicare Other | Admitting: Gastroenterology

## 2021-11-19 ENCOUNTER — Telehealth: Payer: Self-pay

## 2021-11-19 VITALS — BP 148/93 | HR 94 | Temp 98.6°F | Ht 72.0 in | Wt 246.2 lb

## 2021-11-19 DIAGNOSIS — D509 Iron deficiency anemia, unspecified: Secondary | ICD-10-CM | POA: Diagnosis not present

## 2021-11-19 MED ORDER — PANTOPRAZOLE SODIUM 40 MG PO TBEC: 40.0000 mg | DELAYED_RELEASE_TABLET | Freq: Every day | ORAL | 5 refills | Status: DC

## 2021-11-19 NOTE — Patient Instructions (Signed)
We will review medication list once available. I have requested copy of your last labs from PCP for review. Further recommendations to follow.

## 2021-11-19 NOTE — Telephone Encounter (Signed)
Med list has been updated per pharmacy.

## 2021-11-20 NOTE — Telephone Encounter (Signed)
noted 

## 2021-12-09 ENCOUNTER — Other Ambulatory Visit: Payer: Self-pay | Admitting: Cardiology

## 2021-12-24 ENCOUNTER — Other Ambulatory Visit: Payer: Self-pay | Admitting: Cardiology

## 2021-12-29 NOTE — Progress Notes (Unsigned)
Cardiology Office Note:    Date:  12/30/2021   ID:  Swanson, Dennis 01/16/1956, MRN [id number]  PCP:  Monico Blitz, [place] Prov[facility] Cardiologist:  Carlyle Dolly, MD { Click to update primary MD,subspecialty MD or APP then REFRESH:1}    Referring MD: Monico Blitz, MD   Chief Complaint:  Follow-up {Click here for Visit Info    :1}    History of Present Illness:   Dennis Swanson is a 66 y.o. male with history of paroxysmal atrial fibrillation, HFmrEF (EF 40-45% in 2011, at 60-65% in 04/2015 and at 45-50% by echo in 09/2020), mitral regurgitation, HTN, GIB (occurring in 08/2020), HLD and history of alcohol abuse     He was admitted to [facility] on 09/21/2020 for evaluation of worsening dyspnea and lower extremity edema. Was found to have an acute CHF exacerbation and repeat echocardiogram showed his EF was at 45 to 50% but he was noted to have severely reduced RV function and his RV was severely enlarged. Was also noted to have severe tricuspid regurgitation, severe biatrial dilation, mild MR and elevated PASP. He was started on Coreg, Spironolactone, Entresto and Farxiga and transitioned from IV Lasix to PO Lasix '40mg'$  BID at discharge.   Patient last saw Ms Ahmed Prima 09/2020 and had stopped entresto, was eating high salt diet and had gained 10 lbs. Needed a sleep study and PFT's. He appeared euvolemic and entresto restarted. Referred to pulmonary.  He had hernia and hemorrhoid surgery this year.   Patient comes in for f/u. Denies chest pain, dyspnea, edema, palpitations. He walks a little bit and does some push ups and sit ups. Drinks beer a can a day    Past Medical History:  Diagnosis Date   Acute pulmonary embolism ([place]) 02/16/2009   Alcohol abuse    Atrial flutter ([facility])    CHF (congestive heart failure) ([place])    a. EF 40-45% in 2011 b. EF at 60-65% in 04/2015 c. EF at 45-50% by echo in 09/2020   Dysrhythmia    Hypertension    Lower extremity  cellulitis    bilateral   Systolic dysfunction    Current Medications: Current Meds  Medication Sig   apixaban (ELIQUIS) 5 MG TABS tablet Take 1 tablet (5 mg total) by mouth 2 (two) times daily.   carvedilol (COREG) 6.25 MG tablet TAKE 1 TABLET BY MOUTH TWICE DAILY WITH A MEAL   furosemide (LASIX) 40 MG tablet Take 1 tablet (40 mg total) by mouth 2 (two) times daily.   naloxone (NARCAN) nasal spray 4 mg/0.1 mL    oxyCODONE-acetaminophen (PERCOCET) 10-325 MG tablet Take 1 tablet by mouth 3 (three) times daily as needed.   pantoprazole (PROTONIX) 40 MG tablet Take 1 tablet (40 mg total) by mouth daily before breakfast.   sacubitril-valsartan (ENTRESTO) 24-26 MG Take 1 tablet by mouth 2 (two) times daily.   spironolactone (ALDACTONE) 25 MG tablet Take 1 tablet by mouth once daily    Allergies:   Patient has no known allergies.   Social History   Tobacco Use   Smoking status: Never   Smokeless tobacco: Never  Vaping Use   Vaping Use: Never used  Substance Use Topics   Alcohol use: Yes    Alcohol/week: 3.0 standard drinks of alcohol    Types: 3 Cans of beer per week    Comment: 1-2 beer a week   Drug use: No    Family Hx: The patient's family history  includes Cancer in his father; Diabetes in his brother; Heart attack in his mother; Hypertension in his brother. There is no history of Colon cancer.  ROS     Physical Exam:    VS:  BP 136/84   Pulse 75   Ht 6' (1.829 m)   Wt 248 lb (112.5 kg)   SpO2 94%   BMI 33.63 kg/m     Wt Readings from Last 3 Encounters:  12/30/21 248 lb (112.5 kg)  11/19/21 246 lb 3.2 oz (111.7 kg)  06/20/21 247 lb 5.7 oz (112.2 kg)    Physical Exam  GEN: Well nourished, well developed, in no acute distress  HEENT: normal  Neck: no JVD, carotid bruits, or masses Cardiac:RRR; no murmurs, rubs, or gallops  Respiratory:  clear to auscultation bilaterally, normal work of breathing GI: soft, nontender, nondistended, + BS Ext: without cyanosis,  clubbing, or edema, Good distal pulses bilaterally MS: no deformity or atrophy  Skin: warm and dry, no rash Neuro:  Alert and Oriented x 3, Strength and sensation are intact Psych: euthymic mood, full affect        EKGs/Labs/Other Test Reviewed:    EKG:  EKG is *** ordered today.  The ekg ordered today demonstrates ***  Recent Labs: 04/18/2021: Magnesium 2.4 06/19/2021: BUN 17; Creatinine, Ser 1.17; Hemoglobin 13.6; Platelets 146; Potassium 4.1; Sodium 142   Recent Lipid Panel No results for input(s): "CHOL", "TRIG", "HDL", "VLDL", "LDLCALC", "LDLDIRECT" in the last 8760 hours.   Prior CV Studies: {Select studies to display:26339}    Echocardiogram: 08/2020 IMPRESSIONS     1. Right ventricular systolic function is severely reduced. The right  ventricular size is severely enlarged. There is moderately elevated  pulmonary artery systolic pressure. The estimated right ventricular  systolic pressure is 46.5 mmHg.   2. 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   3. Left ventricular ejection fraction, by estimation, is 45 to 50%. The  left ventricle has low normal function. The left ventricle demonstrates  global hypokinesis. There is moderate left ventricular hypertrophy. Left  ventricular diastolic parameters are   indeterminate. There is the interventricular septum is flattened in  systole and diastole, consistent with right ventricular pressure and  volume overload.   4. Left atrial size was severely dilated.   5. Right atrial size was severely dilated.   6. The mitral valve is grossly normal. Mild mitral valve regurgitation.  No evidence of mitral stenosis.   7. The aortic valve is tricuspid. There is mild calcification of the  aortic valve. Aortic valve regurgitation is trivial. No aortic stenosis is  present.   8. Aortic dilatation noted. There is mild dilatation of the ascending  aorta, measuring 40 mm.   9. The inferior vena cava is  dilated in size with <50% respiratory  variability, suggesting right atrial pressure of 15 mmHg.  10. Cannot exclude small PFO.   Risk Assessment/Calculations/Metrics:    CHA2DS2-VASc Score = 5  {Confirm score is correct.  If not, click here to update score.  REFRESH note.  :1} This indicates a 7.2% annual risk of stroke. The patient's score is based upon: CHF History: 1 HTN History: 1 Diabetes History: 1 Stroke History: 0 Vascular Disease History: 1 Age Score: 1 Gender Score: 0   {This patient has a significant risk of stroke if diagnosed with atrial fibrillation.  Please consider VKA or DOAC agent for anticoagulation if the bleeding risk is acceptable.   You can also use  the Smart[employer]rase .[email or web address] for documentation.   :[id number]}          ASSESSMENT & PLAN:   No problem-specific Assessment & Plan notes found for this encounter.    HFrEF - His EF was at 45-50%  echocardiogram  08/2020 - Will continue his current regimen of Coreg 6.'25mg'$  BID,  Lasix '40mg'$  BID, Spironolactone '25mg'$  daily and Entresto 24-'26mg'$  BID.  -compensated today -check labs today, repeat echo   Paroxysmal Atrial Fibrillation/Flutter - He denies any recent palpitations and his HR is well-controlled Continue Coreg 6.'25mg'$  BID for rate-control and Eliquis '5mg'$  BID for anticoagulation.  No bleeding problems on eliquis.   RV Dysfunction/Pulmonary HTN -  echocardiogram showed severely reduced RV function and his RV was severely enlarged. Was also noted to have severe tricuspid regurgitation and elevated PASP.  - previously referred to pulm for sleep study but never done. He says he doesn't snore any longer and doesn't think he has sleep apnea. Will readdress if needed after echo.               Dispo:  No follow-ups on file.   Medication Adjustments/Labs and Tests Ordered: Current medicines are reviewed at length with the patient today.  Concerns regarding medicines are outlined above.  Tests Ordered: Orders  Placed This Encounter  Procedures   Comprehensive metabolic panel   CBC   Lipid Profile   TSH   EKG 12-Lead   ECHOCARDIOGRAM COMPLETE   Medication Changes: No orders of the defined types were placed in this encounter.  Signed, Ermalinda Barrios, PA-C  12/30/2021 1:13 PM    [address], [place], [place]  [address] Phone: [phone]; Fax: [phone]

## 2021-12-30 ENCOUNTER — Ambulatory Visit: Payer: Medicare Other | Attending: Physician Assistant | Admitting: Physician Assistant

## 2021-12-30 ENCOUNTER — Encounter: Payer: Self-pay | Admitting: Physician Assistant

## 2021-12-30 VITALS — BP 136/84 | HR 75 | Ht 72.0 in | Wt 248.0 lb

## 2021-12-30 DIAGNOSIS — I5042 Chronic combined systolic (congestive) and diastolic (congestive) heart failure: Secondary | ICD-10-CM

## 2021-12-30 DIAGNOSIS — I519 Heart disease, unspecified: Secondary | ICD-10-CM

## 2021-12-30 DIAGNOSIS — I480 Paroxysmal atrial fibrillation: Secondary | ICD-10-CM

## 2021-12-30 DIAGNOSIS — Z9189 Other specified personal risk factors, not elsewhere classified: Secondary | ICD-10-CM

## 2021-12-30 NOTE — Patient Instructions (Signed)
Medication Instructions:  Your physician recommends that you continue on your current medications as directed. Please refer to the Current Medication list given to you today.   Labwork: CBC,CMET,TSH,LIPIDS  Testing/Procedures: Your physician has requested that you have an echocardiogram. Echocardiography is a painless test that uses sound waves to create images of your heart. It provides your doctor with information about the size and shape of your heart and how well your heart's chambers and valves are working. This procedure takes approximately one hour. There are no restrictions for this procedure. Please do NOT wear cologne, perfume, aftershave, or lotions (deodorant is allowed). Please arrive 15 minutes prior to your appointment time.   Follow-Up: 6 months  Any Other Special Instructions Will Be Listed Below (If Applicable).  If you need a refill on your cardiac medications before your next appointment, please call your pharmacy.

## 2022-01-01 ENCOUNTER — Other Ambulatory Visit: Payer: Self-pay | Admitting: Cardiology

## 2022-01-06 ENCOUNTER — Ambulatory Visit: Payer: Medicare Other | Attending: Physician Assistant

## 2022-01-06 DIAGNOSIS — I5042 Chronic combined systolic (congestive) and diastolic (congestive) heart failure: Secondary | ICD-10-CM

## 2022-01-06 LAB — ECHOCARDIOGRAM COMPLETE
AR max vel: 2.74 cm2
AV Peak grad: 7.3 mmHg
Ao pk vel: 1.36 m/s
Area-P 1/2: 6.37 cm2
Calc EF: 54.1 %
MV M vel: 3.87 m/s
MV Peak grad: 60 mmHg
P 1/2 time: 1425 msec
S' Lateral: 3.2 cm
Single Plane A2C EF: 53.8 %
Single Plane A4C EF: 57.2 %

## 2022-01-07 ENCOUNTER — Telehealth: Payer: Self-pay

## 2022-01-07 DIAGNOSIS — G4730 Sleep apnea, unspecified: Secondary | ICD-10-CM

## 2022-01-07 NOTE — Telephone Encounter (Signed)
-----   Message from Imogene Burn, PA-C sent at 01/07/2022  7:45 AM EST ----- Left heart function has normalized but right heart function severely reduced.  Would recommend referral to pulmonary for sleep study-he declined in the past but should have it done. thanks

## 2022-01-07 NOTE — Telephone Encounter (Signed)
Patient notified and verbalized understanding. Patient agreeable to Pulmonary referral for sleep study.

## 2022-01-20 ENCOUNTER — Other Ambulatory Visit: Payer: Self-pay | Admitting: Cardiology

## 2022-02-27 ENCOUNTER — Other Ambulatory Visit: Payer: Self-pay | Admitting: Cardiology

## 2022-04-28 ENCOUNTER — Encounter: Payer: Self-pay | Admitting: *Deleted

## 2022-05-18 ENCOUNTER — Other Ambulatory Visit: Payer: Self-pay | Admitting: Gastroenterology

## 2022-05-20 ENCOUNTER — Ambulatory Visit (INDEPENDENT_AMBULATORY_CARE_PROVIDER_SITE_OTHER): Payer: 59 | Admitting: Gastroenterology

## 2022-05-20 ENCOUNTER — Institutional Professional Consult (permissible substitution): Payer: Medicare Other | Admitting: Pulmonary Disease

## 2022-05-20 ENCOUNTER — Encounter: Payer: Self-pay | Admitting: Gastroenterology

## 2022-05-20 VITALS — BP 138/79 | HR 80 | Temp 97.4°F | Ht 72.0 in | Wt 252.8 lb

## 2022-05-20 DIAGNOSIS — D509 Iron deficiency anemia, unspecified: Secondary | ICD-10-CM

## 2022-05-20 DIAGNOSIS — K219 Gastro-esophageal reflux disease without esophagitis: Secondary | ICD-10-CM | POA: Insufficient documentation

## 2022-05-20 NOTE — Patient Instructions (Signed)
Please have blood work completed with your primary care provider to recheck your hemoglobin.  Continue pantoprazole 40 mg daily 30 minutes before breakfast to control your reflux symptoms.  We will follow-up with you in 6 months or sooner if needed.  It was good to see you again today!  And glad you are doing well overall!  Aliene Altes, PA-C [facility]

## 2022-05-20 NOTE — Progress Notes (Signed)
Referring Provider: Monico Blitz, MD Primary Care Physician:  Monico Blitz, MD Primary GI Physician: Dr. Abbey Chatters  Chief Complaint  Patient presents with   Follow-up    HPI:   NYHEIM Swanson is a 67 y.o. male male presenting today for follow-up.   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   Patient seen by Dr. Johney Maine January 2023. Suspected the mass prolapsing from patient's anus was likely a chronically irritated hemorrhoid.  Recommended hemorrhoidectomy.  He was also noted to have hernias in bilateral groin areas.  Recommended hernia surgery 3 months after hemorrhoid surgery. He completed both surgeries.   MRI liver with and without contrast February 2023 IMPRESSION: 1. The 9 by 7 by 6 mm mildly T2 hyperintense lesion in segment 3 of the liver described on recent CT abdomen is unfortunately substantially obscured on most sequences including post-contrast sequences on today's exam due to motion. However, there is a very faint suggestion that this lesion was probably present on the CT chest from 08/21/2009. Based on the accentuated T2 signal (although not fluid signal) this is most likely a small hemangioma or similar benign lesion. 2. Prior ascites has resolved. 3. Other imaging findings of potential clinical significance: Mild cardiomegaly. Mildly elevated right hemidiaphragm. Bilateral gynecomastia. Small hepatic cysts. Aortic Atherosclerosis (ICD10-I70.0). Mildly redundant sigmoid colon. Lower thoracic and lumbar spondylosis and degenerative disc disease.  Today:  GERD:  Well controlled on pantoprazole 40 mg daily.  Denies  abdominal pain, nausea, vomiting, dysphagia.  IDA:  Most recent labs 06/19/2021 with hemoglobin 13.6, normocytic indices.  Bowels moving well. No brbpr or melena.  No NSAIDs.  No iron supplementation.  Past Medical History:  Diagnosis Date   Acute pulmonary embolism 02/16/2009   Alcohol abuse    Atrial flutter    CHF (congestive heart failure)    a. EF 40-45% in 2011 b. EF at 60-65% in 04/2015 c. EF at 45-50% by echo in 09/2020   Dysrhythmia    Hypertension    Lower extremity cellulitis    bilateral   Systolic dysfunction     Past Surgical History:  Procedure Laterality Date   COLONOSCOPY N/A 05/17/2012   Procedure: COLONOSCOPY;  Surgeon: Danie Binder, MD;  [facility];  Service: Endoscopy;  Laterality: N/A;  10:30   COLONOSCOPY WITH PROPOFOL N/A 09/15/2020   Surgeon: Hurshel Keys K, DO;  2 cm rubbery textured nodular anal mass, internal hemorrhoids, external hemorrhoids, sigmoid and descending diverticula, redundant colon.  Pathology revealed polypoid rectal mucosa with acute and chronic inflammation with erosion, granular hyperplasia and macular isolation of lamina propria, consistent with mucosal prolapse.   ESOPHAGOGASTRODUODENOSCOPY (EGD) WITH PROPOFOL N/A 09/15/2020   Surgeon: Eloise Harman, DO; Small hiatal hernia, gastritis biopsied, normal examined duodenum.  Biopsy with no specific histopathologic changes, negative for H. pylori.   EVALUATION UNDER ANESTHESIA WITH HEMORRHOIDECTOMY N/A 04/17/2021   Procedure: HEMORRHOIDECTOMY WITH LIGATION AND HEMORRHOIDOPEXY, ANORECTAL EXAMINATION UNDER ANESTHESIA, EXCISION OF PROLAPSE ANAL MASS, EXCISON OF GRADE 3 AND 4 PROLAPSING HEMORRHOIDS;  Surgeon: Michael Boston, MD;  Location: [facility];  Service: General;  Laterality: N/A;   INGUINAL HERNIA REPAIR Right 06/20/2021  Procedure: LAPAROSCOPIC RIGHT AND LEFT INGUINAL HERNIA;  Surgeon: Michael Boston, MD;  Location: [facility];  Service: General;  Laterality: Right;   None      TONSILLECTOMY      Current Outpatient Medications  Medication Sig Dispense Refill   apixaban (ELIQUIS) 5 MG TABS tablet Take 1 tablet (5 mg total) by mouth 2 (two) times daily. 60 tablet 1   carvedilol (COREG) 6.25 MG tablet TAKE 1 TABLET BY MOUTH TWICE DAILY WITH A MEAL 60 tablet 6   furosemide (LASIX) 40 MG tablet Take 1 tablet by mouth twice daily 180 tablet 2   naloxone (NARCAN) nasal spray 4 mg/0.1 mL      oxyCODONE-acetaminophen (PERCOCET) 10-325 MG tablet Take 1 tablet by mouth 3 (three) times daily as needed.     pantoprazole (PROTONIX) 40 MG tablet TAKE 1 TABLET BY MOUTH ONCE DAILY BEFORE BREAKFAST 90 tablet 3   sacubitril-valsartan (ENTRESTO) 24-26 MG Take 1 tablet by mouth twice daily 180 tablet 2   spironolactone (ALDACTONE) 25 MG tablet TAKE 1 TABLET BY MOUTH ONCE DAILY . APPOINTMENT REQUIRED FOR FUTURE REFILLS 90 tablet 3   No current facility-administered medications for this visit.    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  Tobacco Use   Smoking status: Never   Smokeless tobacco: Never  Vaping Use   Vaping Use: Never used  Substance and Sexual Activity   Alcohol use: Yes    Alcohol/week: 3.0 standard drinks of alcohol    Types: 3 Cans of beer per week    Comment: 1-2 beer a week   Drug use: No   Sexual activity: Not Currently  Other Topics Concern   Not on file  Social History Narrative   Lives alone, 2 daughters   No regular exercise   Social Determinants of Health   Financial Resource Strain:  Low Risk  (10/02/2020)   Overall Financial Resource Strain (CARDIA)    Difficulty of Paying Living Expenses: Not very hard  Food Insecurity: No Food Insecurity (10/02/2020)   Hunger Vital Sign    Worried About Running Out of Food in the Last Year: Never true    Ran Out of Food in the Last Year: Never true  Transportation Needs: No Transportation Needs (10/02/2020)   PRAPARE - Hydrologist (Medical): No    Lack of Transportation (Non-Medical): No  Physical Activity: Inactive (10/02/2020)   Exercise Vital Sign    Days of Exercise per Week: 0 days    Minutes of Exercise per Session: 0 min  Stress: Not on file  Social Connections: Not on file    Review of Systems: Gen: Denies fever, chills, cold or flulike symptoms, presyncope, syncope. CV: Denies chest pain, palpitations.  Resp: Denies dyspnea at rest, cough. GI: See HPI  Heme: See HPI  Physical Exam: BP 138/79 (BP Location: Right Arm, Patient Position: Sitting, Cuff Size: Large)  Pulse 80   Temp (!) 97.4 F (36.3 C) (Temporal)   Ht 6' (1.829 m)   Wt 252 lb 12.8 oz (114.7 kg)   SpO2 93%   BMI 34.29 kg/m  General:   Alert and oriented. No distress noted. Pleasant and cooperative.  Head:  Normocephalic and atraumatic. Eyes:  Conjuctiva clear without scleral icterus. Heart:  S1, S2 present without murmurs appreciated. Lungs:  Clear to auscultation bilaterally. No wheezes, rales, or rhonchi. No distress.  Abdomen:  +BS, soft, non-tender and non-distended. No rebound or guarding. No HSM or masses noted. Msk:  Symmetrical without gross deformities. Normal posture. Extremities:  Without edema. Neurologic:  Alert and  oriented x4 Psych:  Normal mood and affect.    Assessment:  67 year old male with history of GERD, liver cysts, IDA, presenting today for follow-up.  GERD: Well-controlled on pantoprazole 40 mg daily.  No alarm symptoms.  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  He has since seen Dr. Johney Maine and undergone hemorrhoidectomy as well as repair of bilateral inguinal hernias.  Hemoglobin and iron panel returned to normal in February 2023.  Most recent labs in May 2023 with hemoglobin remaining within normal limits.  He denies overt GI bleeding.  He has not been on oral iron.  Will plan to update labs.  He is requesting to have blood work completed with PCP as he has routine follow-up next month.  Liver cysts: Several liver cysts noted on imaging.  Indeterminate liver lesion in the left liver lobe measuring 9 mm on CT in August 2022. Attempted MRI for further characterization, but due to motion artifact, it was difficult.  It is felt that similar lesion was noted on CT as outlined above. This was previously discussed with Dr. Abbey Chatters with no recommendations for further workup.  Overall, likely benign finding.  Plan:  CBC to be completed with PCP.  Requested patient have results faxed back to our office. Continue pantoprazole 40 mg daily. Follow-up in 6 months or sooner if needed.   Aliene Altes, PA-C [facility] 05/20/2022

## 2022-06-22 ENCOUNTER — Ambulatory Visit: Payer: Medicare Other | Admitting: Cardiology

## 2022-06-24 ENCOUNTER — Encounter: Payer: Self-pay | Admitting: Cardiology

## 2022-06-24 ENCOUNTER — Ambulatory Visit: Payer: 59 | Attending: Cardiology | Admitting: Cardiology

## 2022-06-24 VITALS — BP 128/88 | HR 79 | Ht 72.0 in | Wt 243.4 lb

## 2022-06-24 DIAGNOSIS — I272 Pulmonary hypertension, unspecified: Secondary | ICD-10-CM | POA: Diagnosis not present

## 2022-06-24 DIAGNOSIS — I5032 Chronic diastolic (congestive) heart failure: Secondary | ICD-10-CM | POA: Diagnosis not present

## 2022-06-24 DIAGNOSIS — I480 Paroxysmal atrial fibrillation: Secondary | ICD-10-CM | POA: Diagnosis not present

## 2022-06-24 DIAGNOSIS — I50810 Right heart failure, unspecified: Secondary | ICD-10-CM | POA: Diagnosis not present

## 2022-06-24 NOTE — Progress Notes (Signed)
Clinical Summary Dennis Swanson is a 67 y.o.male seen today for follow up of the following medical problems.    1. Afib  -no recent palpitations - compliant with meds - no bleeding on eluiquis   2. HFimpEF - echo 2011 LVEF 40-45% - 04/2015 LVEF 60-65%, grade I diastolic dysfunction.  08/2020 echo: LVEF 45-50%, severe RVE and dysfunction 12/2021 echo: LVEF 50-55%, flattened ventricular septum systole/diastole, severe RVE and dysfunction, severe BAE    - no SOB/DOE, no LE edema - compliant with meds   3. Right ventricular failure 08/2020 echo: LVEF 45-50%, severe RVE and dysfunction 12/2021 echo: LVEF 50-55%, flattened ventricular septum systole/diastole, severe RVE and dysfunction, severe BAE  - no tobacco history - upcoming appt July for sleep apnea eval - has had left sided dysfunction, both systolic and diastolic before. Severe BAE would be consistent with severe long standing elevated pressures - history of prior PE 08/2009 - 7/223 ANA negative by another provider    4. HTN - compliant with meds   5. Hyperlipidemia -upcoming labs with pcp          Past Medical History:  Diagnosis Date   Acute pulmonary embolism (HCC) 02/16/2009   Alcohol abuse    Atrial flutter (HCC)    CHF (congestive heart failure) (HCC)    a. EF 40-45% in 2011 b. EF at 60-65% in 04/2015 c. EF at 45-50% by echo in 09/2020   Dysrhythmia    Hypertension    Lower extremity cellulitis    bilateral   Systolic dysfunction      No Known Allergies   Current Outpatient Medications  Medication Sig Dispense Refill   apixaban (ELIQUIS) 5 MG TABS tablet Take 1 tablet (5 mg total) by mouth 2 (two) times daily. 60 tablet 1   carvedilol (COREG) 6.25 MG tablet TAKE 1 TABLET BY MOUTH TWICE DAILY WITH A MEAL 60 tablet 6   furosemide (LASIX) 40 MG tablet Take 1 tablet by mouth twice daily 180 tablet 2   naloxone (NARCAN) nasal spray 4 mg/0.1 mL      oxyCODONE-acetaminophen (PERCOCET) 10-325 MG  tablet Take 1 tablet by mouth 3 (three) times daily as needed.     pantoprazole (PROTONIX) 40 MG tablet TAKE 1 TABLET BY MOUTH ONCE DAILY BEFORE BREAKFAST 90 tablet 3   sacubitril-valsartan (ENTRESTO) 24-26 MG Take 1 tablet by mouth twice daily 180 tablet 2   spironolactone (ALDACTONE) 25 MG tablet TAKE 1 TABLET BY MOUTH ONCE DAILY . APPOINTMENT REQUIRED FOR FUTURE REFILLS 90 tablet 3   No current facility-administered medications for this visit.     Past Surgical History:  Procedure Laterality Date   COLONOSCOPY N/A 05/17/2012   Procedure: COLONOSCOPY;  Surgeon: West Bali, MD;  [facility];  Service: Endoscopy;  Laterality: N/A;  10:30   COLONOSCOPY WITH PROPOFOL N/A 09/15/2020   Surgeon: Earnest Bailey K, DO;  2 cm rubbery textured nodular anal mass, internal hemorrhoids, external hemorrhoids, sigmoid and descending diverticula, redundant colon.  Pathology revealed polypoid rectal mucosa with acute and chronic inflammation with erosion, granular hyperplasia and macular isolation of lamina propria, consistent with mucosal prolapse.   ESOPHAGOGASTRODUODENOSCOPY (EGD) WITH PROPOFOL N/A 09/15/2020   Surgeon: Lanelle Bal, DO; Small hiatal hernia, gastritis biopsied, normal examined duodenum.  Biopsy with no specific histopathologic changes, negative for H. pylori.   EVALUATION UNDER ANESTHESIA WITH HEMORRHOIDECTOMY N/A 04/17/2021   Procedure: HEMORRHOIDECTOMY WITH LIGATION AND HEMORRHOIDOPEXY, ANORECTAL EXAMINATION UNDER ANESTHESIA, EXCISION OF  PROLAPSE ANAL MASS, EXCISON OF GRADE 3 AND 4 PROLAPSING HEMORRHOIDS;  Surgeon: Karie Soda, MD;  Location: [facility];  Service: General;  Laterality: N/A;   INGUINAL HERNIA REPAIR Right 06/20/2021   Procedure: LAPAROSCOPIC RIGHT AND LEFT INGUINAL HERNIA;  Surgeon: Karie Soda, MD;  Location: [facility];  Service: General;  Laterality: Right;   None     TONSILLECTOMY       No Known Allergies    Family History  Problem Relation Age  of Onset   Heart attack Mother    Hypertension Brother    Diabetes Brother    Cancer Father    Colon cancer Neg Hx      Social History Mr. Berenguer reports that he has never smoked. He has never used smokeless tobacco. Mr. Hofler reports current alcohol use of about 3.0 standard drinks of alcohol per week.   Review of Systems CONSTITUTIONAL: No weight loss, fever, chills, weakness or fatigue.  HEENT: Eyes: No visual loss, blurred vision, double vision or yellow sclerae.No hearing loss, sneezing, congestion, runny nose or sore throat.  SKIN: No rash or itching.  CARDIOVASCULAR: per hpi RESPIRATORY: No shortness of breath, cough or sputum.  GASTROINTESTINAL: No anorexia, nausea, vomiting or diarrhea. No abdominal pain or blood.  GENITOURINARY: No burning on urination, no polyuria NEUROLOGICAL: No headache, dizziness, syncope, paralysis, ataxia, numbness or tingling in the extremities. No change in bowel or bladder control.  MUSCULOSKELETAL: No muscle, back pain, joint pain or stiffness.  LYMPHATICS: No enlarged nodes. No history of splenectomy.  PSYCHIATRIC: No history of depression or anxiety.  ENDOCRINOLOGIC: No reports of sweating, cold or heat intolerance. No polyuria or polydipsia.  Marland Kitchen   Physical Examination Today's Vitals   06/24/22 1454  BP: 128/88  Pulse: 79  SpO2: 93%  Weight: 243 lb 6.4 oz (110.4 kg)  Height: 6' (1.829 m)   Body mass index is 33.01 kg/m.  Gen: resting comfortably, no acute distress HEENT: no scleral icterus, pupils equal round and reactive, no palptable cervical adenopathy,  CV: RRR, no m/r,g no jvd Resp: Clear to auscultation bilaterally GI: abdomen is soft, non-tender, non-distended, normal bowel sounds, no hepatosplenomegaly MSK: extremities are warm, no edema.  Skin: warm, no rash Neuro:  no focal deficits Psych: appropriate affect   Diagnostic Studies  Study Conclusions   - Left ventricle: The cavity size was normal. Wall  thickness was   increased in a pattern of moderate LVH. Systolic function was   normal. The estimated ejection fraction was in the range of 60%   to 65%. Doppler parameters are consistent with abnormal left   ventricular relaxation (grade 1 diastolic dysfunction). - Aortic valve: Mildly calcified annulus. Trileaflet; mildly   thickened leaflets. Valve area (VTI): 2.5 cm^2. Valve area   (Vmax): 2.38 cm^2. - Mitral valve: Mildly calcified annulus. Mildly thickened leaflets   . There was mild regurgitation. - Technically adequate study.   12/2021 echo IMPRESSIONS     1. Left ventricular ejection fraction, by estimation, is 50 to 55%. The  left ventricle has low normal function. Left ventricular endocardial  border not optimally defined to evaluate regional wall motion. Left  ventricular diastolic function could not be  evaluated. There is the interventricular septum is flattened in systole  and diastole, consistent with right ventricular pressure and volume  overload.   2. Right ventricular systolic function is severely reduced. The right  ventricular size is severely enlarged.   3. Left atrial size was severely dilated.   4.  Right atrial size was severely dilated.   5. The mitral valve is grossly normal. Mild mitral valve regurgitation.  No evidence of mitral stenosis.   6. The aortic valve is tricuspid. There is mild calcification of the  aortic valve. Aortic valve regurgitation is trivial. No aortic stenosis is  present.   7. Aortic dilatation noted. There is mild dilatation of the aortic root,  measuring 40 mm.     Assessment and Plan  1. Afib - no symptoms, continue current meds   2. HFimpEF - LVEF has normalized, no symptoms - continue current meds   3.RV failure/pulmonary hypertension - denies symptoms - unclear etiology - he does have history of left sided dysfunction, both systolic and diastolic - OSA testing coming up - check PFTs. Prior PE in 2011, check VQ  scan for possibly CTEPH -may warrant RHC in the near future      Antoine Poche, M.D.

## 2022-06-24 NOTE — Patient Instructions (Signed)
Medication Instructions:  Your physician recommends that you continue on your current medications as directed. Please refer to the Current Medication list given to you today.  *If you need a refill on your cardiac medications before your next appointment, please call your pharmacy*   Lab Work: None If you have labs (blood work) drawn today and your tests are completely normal, you will receive your results only by: MyChart Message (if you have MyChart) OR A paper copy in the mail If you have any lab test that is abnormal or we need to change your treatment, we will call you to review the results.   Testing/Procedures: Your physician has recommended that you have a pulmonary function test. Pulmonary Function Tests are a group of tests that measure how well air moves in and out of your lungs.  VQ Scan   Follow-Up: At [facility], you and your health needs are our priority.  As part of our continuing mission to provide you with exceptional heart care, we have created designated Provider Care Teams.  These Care Teams include your primary Cardiologist (physician) and Advanced Practice Providers (APPs -  Physician Assistants and Nurse Practitioners) who all work together to provide you with the care you need, when you need it.  We recommend signing up for the patient portal called "MyChart".  Sign up information is provided on this After Visit Summary.  MyChart is used to connect with patients for Virtual Visits (Telemedicine).  Patients are able to view lab/test results, encounter notes, upcoming appointments, etc.  Non-urgent messages can be sent to your provider as well.   To learn more about what you can do with MyChart, go to [email or web address].    Your next appointment:   4 month(s)  Provider:   Dina Rich, MD    Other Instructions

## 2022-07-02 NOTE — Addendum Note (Signed)
Addended by: Kerney Elbe on: 07/02/2022 02:19 PM   Modules accepted: Orders

## 2022-07-10 ENCOUNTER — Ambulatory Visit (HOSPITAL_COMMUNITY): Payer: 59

## 2022-07-24 ENCOUNTER — Telehealth: Payer: Self-pay | Admitting: Gastroenterology

## 2022-07-24 NOTE — Telephone Encounter (Signed)
Did patient have labs completed with PCP last month?  When I saw him in April, he stated he was going to have routine labs with his primary care provider in May and would have his CBC completed at that time.  I have not received any record of this and do not see anything in his chart.

## 2022-07-27 NOTE — Telephone Encounter (Signed)
Spoke to pt, he informed me that he missed his OV. Pt states he will try and make another one and have blood work done then.

## 2022-07-28 NOTE — Telephone Encounter (Signed)
OK. He doesn't have to have an appointment with his PCP to have labs completed. He can go to [facility] or we can place new orders for [facility] if he would like or he should be able to just go to his PCPs office for a lab appointment.

## 2022-07-29 NOTE — Telephone Encounter (Signed)
LMOM for pt to call office  

## 2022-07-31 ENCOUNTER — Ambulatory Visit (HOSPITAL_COMMUNITY)
Admission: RE | Admit: 2022-07-31 | Discharge: 2022-07-31 | Disposition: A | Discharge status: Home / Self Care | Payer: 59 | Source: Ambulatory Visit | Attending: Cardiology | Admitting: Cardiology

## 2022-07-31 ENCOUNTER — Other Ambulatory Visit: Payer: Self-pay | Admitting: Cardiology

## 2022-07-31 ENCOUNTER — Encounter (HOSPITAL_COMMUNITY): Payer: Self-pay

## 2022-07-31 DIAGNOSIS — I480 Paroxysmal atrial fibrillation: Secondary | ICD-10-CM

## 2022-07-31 DIAGNOSIS — I272 Pulmonary hypertension, unspecified: Secondary | ICD-10-CM | POA: Insufficient documentation

## 2022-07-31 DIAGNOSIS — I5032 Chronic diastolic (congestive) heart failure: Secondary | ICD-10-CM

## 2022-07-31 DIAGNOSIS — I50810 Right heart failure, unspecified: Secondary | ICD-10-CM

## 2022-07-31 MED ORDER — TECHNETIUM TO 99M ALBUMIN AGGREGATED
4.0000 | Freq: Once | INTRAVENOUS | Status: AC | PRN
Start: 2022-07-31 — End: 2022-07-31
  Administered 2022-07-31: 4.1 via INTRAVENOUS

## 2022-08-04 ENCOUNTER — Encounter: Payer: Self-pay | Admitting: *Deleted

## 2022-08-04 NOTE — Telephone Encounter (Signed)
Noted  

## 2022-08-04 NOTE — Telephone Encounter (Signed)
Tried several times to reach pt. Mailed a letter.  

## 2022-09-03 ENCOUNTER — Ambulatory Visit: Payer: 59 | Admitting: Pulmonary Disease

## 2022-09-03 ENCOUNTER — Encounter: Payer: Self-pay | Admitting: Pulmonary Disease

## 2022-09-03 VITALS — BP 137/81 | HR 67 | Ht 72.0 in | Wt 255.0 lb

## 2022-09-03 DIAGNOSIS — I50812 Chronic right heart failure: Secondary | ICD-10-CM

## 2022-09-03 DIAGNOSIS — G4733 Obstructive sleep apnea (adult) (pediatric): Secondary | ICD-10-CM

## 2022-09-03 NOTE — Progress Notes (Signed)
Subjective:    Patient ID: Dennis Swanson, male    DOB: 24-Oct-1955, 67 y.o.   MRN: [id number]  HPI  67 year old never smoker referred by cardiology for evaluation of sleep disordered breathing  He has been diagnosed with right ventricular failure 08/2020 echo: LVEF 45-50%, severe RVE and dysfunction 12/2021 echo: LVEF 50-55%, flattened ventricular septum systole/diastole, severe RVE and dysfunction, severe BAE  VQ scan was negative, PFTs are planned  PMH : paroxysmal atrial fibrillation,  HFrEF (EF 40-45% in 2011, at 60-65% in 04/2015 and at 45-50% by echo in 09/2020), mitral regurgitation, HTN,  GIB (occurring in 08/2020),  history of alcohol abuse  -he used to drink heavily until 5 years ago prior PE 08/2009   He denies excessive daytime somnolence.  He takes an occasional nap during the day.  He is disabled and stopped working about 20 years ago.  Epworth sleepiness score is 3.  He denies snoring or witnessed apneas there is no choking or gasping episodes in his sleep  Bedtime is around 11 PM or midnight, sleep latency can be up to an hour reports sleeping on his back on his side with 2 pillows, up to 2 nocturnal awakenings for nocturia and is out of bed by 7 AM feeling rested without dryness of mouth or headache.  He has gained 10 pounds in the last 2 years There is no history suggestive of cataplexy, sleep paralysis or parasomnias    Past Medical History:  Diagnosis Date   Acute pulmonary embolism (HCC) 02/16/2009   Alcohol abuse    Atrial flutter (HCC)    CHF (congestive heart failure) (HCC)    a. EF 40-45% in 2011 b. EF at 60-65% in 04/2015 c. EF at 45-50% by echo in 09/2020   Dysrhythmia    Hypertension    Lower extremity cellulitis    bilateral   Systolic dysfunction    Past Surgical History:  Procedure Laterality Date   COLONOSCOPY N/A 05/17/2012   Procedure: COLONOSCOPY;  Surgeon: West Bali, MD;  Location: [facility];  Service: Endoscopy;  Laterality:  N/A;  10:30   COLONOSCOPY WITH PROPOFOL N/A 09/15/2020   Surgeon: Earnest Bailey K, DO;  2 cm rubbery textured nodular anal mass, internal hemorrhoids, external hemorrhoids, sigmoid and descending diverticula, redundant colon.  Pathology revealed polypoid rectal mucosa with acute and chronic inflammation with erosion, granular hyperplasia and macular isolation of lamina propria, consistent with mucosal prolapse.   ESOPHAGOGASTRODUODENOSCOPY (EGD) WITH PROPOFOL N/A 09/15/2020   Surgeon: Lanelle Bal, DO; Small hiatal hernia, gastritis biopsied, normal examined duodenum.  Biopsy with no specific histopathologic changes, negative for H. pylori.   EVALUATION UNDER ANESTHESIA WITH HEMORRHOIDECTOMY N/A 04/17/2021   Procedure: HEMORRHOIDECTOMY WITH LIGATION AND HEMORRHOIDOPEXY, ANORECTAL EXAMINATION UNDER ANESTHESIA, EXCISION OF PROLAPSE ANAL MASS, EXCISON OF GRADE 3 AND 4 PROLAPSING HEMORRHOIDS;  Surgeon: Karie Soda, MD;  Location: [facility];  Service: General;  Laterality: N/A;   INGUINAL HERNIA REPAIR Right 06/20/2021   Procedure: LAPAROSCOPIC RIGHT AND LEFT INGUINAL HERNIA;  Surgeon: Karie Soda, MD;  Location: [facility];  Service: General;  Laterality: Right;   None     TONSILLECTOMY      No Known Allergies  Social History   Socioeconomic History   Marital status: Single    Spouse name: Not on file   Number of children: Not on file   Years of education: Not on file   Highest education level: Not on file  Occupational History   Occupation:  Unemployed, 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  Tobacco Use   Smoking status: Never   Smokeless tobacco: Never  Vaping Use   Vaping status: Never Used  Substance and Sexual Activity   Alcohol use: Yes    Alcohol/week: 3.0 standard drinks of alcohol    Types: 3 Cans of beer per week    Comment: 1-2 beer a week   Drug use: No   Sexual activity: Not Currently  Other Topics Concern   Not on file  Social History Narrative   Lives alone, 2  daughters   No regular exercise   Social Determinants of Health   Financial Resource Strain: Low Risk  (10/02/2020)   Overall Financial Resource Strain (CARDIA)    Difficulty of Paying Living Expenses: Not very hard  Food Insecurity: No Food Insecurity (10/02/2020)   Hunger Vital Sign    Worried About Running Out of Food in the Last Year: Never true    Ran Out of Food in the Last Year: Never true  Transportation Needs: No Transportation Needs (10/02/2020)   PRAPARE - Administrator, Civil Service (Medical): No    Lack of Transportation (Non-Medical): No  Physical Activity: Inactive (10/02/2020)   Exercise Vital Sign    Days of Exercise per Week: 0 days    Minutes of Exercise per Session: 0 min  Stress: Not on file  Social Connections: Not on file  Intimate Partner Violence: Not on file    Family History  Problem Relation Age of Onset   Heart attack Mother    Hypertension Brother    Diabetes Brother    Cancer Father    Colon cancer Neg Hx      Review of Systems Constitutional: negative for anorexia, fevers and sweats  Eyes: negative for irritation, redness and visual disturbance  Ears, nose, mouth, throat, and face: negative for earaches, epistaxis, nasal congestion and sore throat  Respiratory: negative for cough, dyspnea on exertion, sputum and wheezing  Cardiovascular: negative for chest pain, dyspnea, lower extremity edema, orthopnea, palpitations and syncope  Gastrointestinal: negative for abdominal pain, constipation, diarrhea, melena, nausea and vomiting  Genitourinary:negative for dysuria, frequency and hematuria  Hematologic/lymphatic: negative for bleeding, easy bruising and lymphadenopathy  Musculoskeletal:negative for arthralgias, muscle weakness and stiff joints  Neurological: negative for coordination problems, gait problems, headaches and weakness  Endocrine: negative for diabetic symptoms including polydipsia, polyuria and weight loss      Objective:   Physical Exam   Gen. Pleasant, obese, in no distress, normal affect ENT - no pallor,icterus, no post nasal drip, class 2 airway Neck: No JVD, no thyromegaly, no carotid bruits Lungs: no use of accessory muscles, no dullness to percussion, decreased without rales or rhonchi  Cardiovascular: Rhythm regular, heart sounds  normal, no murmurs or gallops, no peripheral edema Abdomen: soft and non-tender, no hepatosplenomegaly, BS normal. Musculoskeletal: No deformities, no cyanosis or clubbing Neuro:  alert, non focal, no tremors        Assessment & Plan:    Biventricular failure especially right heart failure -no cause identified.  He has a remote history of PE but no perfusion defects to suggest CTEPH.  There is no history suggestive of collagen vascular disease.  He has a significant history of drinking in the past, LVEF has recovered.  He may need right heart cath Reasonable to look at sleep idisordered breathing as a cause but he is relatively asymptomatic.  Will proceed with home sleep testing  The pathophysiology of obstructive  sleep apnea , it's cardiovascular consequences & modes of treatment including CPAP were discused with the patient in detail & they evidenced understanding.

## 2022-09-03 NOTE — Patient Instructions (Signed)
X home sleep test °

## 2022-09-30 ENCOUNTER — Other Ambulatory Visit: Payer: Self-pay

## 2022-09-30 MED ORDER — CARVEDILOL 6.25 MG PO TABS: 6.2500 mg | ORAL_TABLET | Freq: Two times a day (BID) | ORAL | 6 refills | Status: DC

## 2022-10-07 ENCOUNTER — Other Ambulatory Visit: Payer: Self-pay | Admitting: Cardiology

## 2022-10-15 ENCOUNTER — Encounter: Payer: Self-pay | Admitting: Gastroenterology

## 2022-10-20 ENCOUNTER — Ambulatory Visit (HOSPITAL_COMMUNITY)
Admission: RE | Admit: 2022-10-20 | Discharge: 2022-10-20 | Disposition: A | Discharge status: Home / Self Care | Payer: 59 | Source: Ambulatory Visit | Attending: Cardiology | Admitting: Cardiology

## 2022-10-20 DIAGNOSIS — I272 Pulmonary hypertension, unspecified: Secondary | ICD-10-CM | POA: Diagnosis present

## 2022-10-20 LAB — PULMONARY FUNCTION TEST
DL/VA % pred: 113 %
DL/VA: 4.54 ml/min/mmHg/L
DLCO unc % pred: 71 %
DLCO unc: 19.87 ml/min/mmHg
FEF 25-75 Post: 0.71 L/s
FEF 25-75 Pre: 0.46 L/s
FEF2575-%Change-Post: 55 %
FEF2575-%Pred-Post: 25 %
FEF2575-%Pred-Pre: 16 %
FEV1-%Change-Post: 17 %
FEV1-%Pred-Post: 32 %
FEV1-%Pred-Pre: 27 %
FEV1-Post: 1.16 L
FEV1-Pre: 0.99 L
FEV1FVC-%Change-Post: 8 %
FEV1FVC-%Pred-Pre: 80 %
FEV6-%Change-Post: 9 %
FEV6-%Pred-Post: 38 %
FEV6-%Pred-Pre: 35 %
FEV6-Post: 1.75 L
FEV6-Pre: 1.61 L
FEV6FVC-%Change-Post: 0 %
FEV6FVC-%Pred-Post: 103 %
FEV6FVC-%Pred-Pre: 102 %
FVC-%Change-Post: 8 %
FVC-%Pred-Post: 36 %
FVC-%Pred-Pre: 34 %
FVC-Post: 1.79 L
FVC-Pre: 1.66 L
Post FEV1/FVC ratio: 65 %
Post FEV6/FVC ratio: 98 %
Pre FEV1/FVC ratio: 60 %
Pre FEV6/FVC Ratio: 97 %
RV % pred: 234 %
RV: 5.9 L
TLC % pred: 111 %
TLC: 8.29 L

## 2022-10-20 MED ORDER — ALBUTEROL SULFATE (2.5 MG/3ML) 0.083% IN NEBU
2.5000 mg | INHALATION_SOLUTION | Freq: Once | RESPIRATORY_TRACT | Status: AC
  Administered 2022-10-20: 2.5 mg via RESPIRATORY_TRACT

## 2022-11-16 ENCOUNTER — Other Ambulatory Visit: Payer: Self-pay | Admitting: Cardiology

## 2022-11-16 ENCOUNTER — Ambulatory Visit: Payer: 59 | Attending: Cardiology | Admitting: Cardiology

## 2022-11-16 NOTE — Progress Notes (Deleted)
Clinical Summary Dennis Swanson is a 67 y.o.male  seen today for follow up of the following medical problems.    1. Afib  -no recent palpitations - compliant with meds - no bleeding on eluiquis   2. HFimpEF - echo 2011 LVEF 40-45% - 04/2015 LVEF 60-65%, grade I diastolic dysfunction.  08/2020 echo: LVEF 45-50%, severe RVE and dysfunction 12/2021 echo: LVEF 50-55%, flattened ventricular septum systole/diastole, severe RVE and dysfunction, severe BAE     - no SOB/DOE, no LE edema - compliant with meds     3. Right ventricular failure 08/2020 echo: LVEF 45-50%, severe RVE and dysfunction 12/2021 echo: LVEF 50-55%, flattened ventricular septum systole/diastole, severe RVE and dysfunction, severe BAE     - upcoming appt July for sleep apnea eval - has had left sided dysfunction, both systolic and diastolic before. Severe BAE would be consistent with severe long standing elevated pressures -  history of prior PE 08/2009. 07/2022 VQ no chronic PE - 7/223 ANA negative by another provider - 10/2022 PFTs: severe obstructive disease, mild diffusion defect     4. HTN - compliant with meds   5. Hyperlipidemia -upcoming labs with pcp   Past Medical History:  Diagnosis Date   Acute pulmonary embolism (HCC) 02/16/2009   Alcohol abuse    Atrial flutter (HCC)    CHF (congestive heart failure) (HCC)    a. EF 40-45% in 2011 b. EF at 60-65% in 04/2015 c. EF at 45-50% by echo in 09/2020   Dysrhythmia    Hypertension    Lower extremity cellulitis    bilateral   Systolic dysfunction      No Known Allergies   Current Outpatient Medications  Medication Sig Dispense Refill   apixaban (ELIQUIS) 5 MG TABS tablet Take 1 tablet (5 mg total) by mouth 2 (two) times daily. 60 tablet 1   carvedilol (COREG) 6.25 MG tablet Take 1 tablet (6.25 mg total) by mouth 2 (two) times daily with a meal. 60 tablet 6   ENTRESTO 24-26 MG Take 1 tablet by mouth twice daily 180 tablet 0   furosemide  (LASIX) 40 MG tablet Take 1 tablet by mouth twice daily 180 tablet 2   naloxone (NARCAN) nasal spray 4 mg/0.1 mL      oxyCODONE-acetaminophen (PERCOCET) 10-325 MG tablet Take 1 tablet by mouth 3 (three) times daily as needed.     pantoprazole (PROTONIX) 40 MG tablet TAKE 1 TABLET BY MOUTH ONCE DAILY BEFORE BREAKFAST 90 tablet 3   spironolactone (ALDACTONE) 25 MG tablet TAKE 1 TABLET BY MOUTH ONCE DAILY . APPOINTMENT REQUIRED FOR FUTURE REFILLS 90 tablet 3   No current facility-administered medications for this visit.     Past Surgical History:  Procedure Laterality Date   COLONOSCOPY N/A 05/17/2012   Procedure: COLONOSCOPY;  Surgeon: West Bali, MD;  Location: [facility];  Service: Endoscopy;  Laterality: N/A;  10:30   COLONOSCOPY WITH PROPOFOL N/A 09/15/2020   Surgeon: Earnest Bailey K, DO;  2 cm rubbery textured nodular anal mass, internal hemorrhoids, external hemorrhoids, sigmoid and descending diverticula, redundant colon.  Pathology revealed polypoid rectal mucosa with acute and chronic inflammation with erosion, granular hyperplasia and macular isolation of lamina propria, consistent with mucosal prolapse.   ESOPHAGOGASTRODUODENOSCOPY (EGD) WITH PROPOFOL N/A 09/15/2020   Surgeon: Lanelle Bal, DO; Small hiatal hernia, gastritis biopsied, normal examined duodenum.  Biopsy with no specific histopathologic changes, negative for H. pylori.   EVALUATION UNDER ANESTHESIA WITH HEMORRHOIDECTOMY  N/A 04/17/2021   Procedure: HEMORRHOIDECTOMY WITH LIGATION AND HEMORRHOIDOPEXY, ANORECTAL EXAMINATION UNDER ANESTHESIA, EXCISION OF PROLAPSE ANAL MASS, EXCISON OF GRADE 3 AND 4 PROLAPSING HEMORRHOIDS;  Surgeon: Karie Soda, MD;  Location: [facility];  Service: General;  Laterality: N/A;   INGUINAL HERNIA REPAIR Right 06/20/2021   Procedure: LAPAROSCOPIC RIGHT AND LEFT INGUINAL HERNIA;  Surgeon: Karie Soda, MD;  Location: [facility];  Service: General;  Laterality: Right;   None     TONSILLECTOMY        No Known Allergies    Family History  Problem Relation Age of Onset   Heart attack Mother    Hypertension Brother    Diabetes Brother    Cancer Father    Colon cancer Neg Hx      Social History Mr. Talerico reports that he has never smoked. He has never used smokeless tobacco. Mr. Hanning reports current alcohol use of about 3.0 standard drinks of alcohol per week.   Review of Systems CONSTITUTIONAL: No weight loss, fever, chills, weakness or fatigue.  HEENT: Eyes: No visual loss, blurred vision, double vision or yellow sclerae.No hearing loss, sneezing, congestion, runny nose or sore throat.  SKIN: No rash or itching.  CARDIOVASCULAR:  RESPIRATORY: No shortness of breath, cough or sputum.  GASTROINTESTINAL: No anorexia, nausea, vomiting or diarrhea. No abdominal pain or blood.  GENITOURINARY: No burning on urination, no polyuria NEUROLOGICAL: No headache, dizziness, syncope, paralysis, ataxia, numbness or tingling in the extremities. No change in bowel or bladder control.  MUSCULOSKELETAL: No muscle, back pain, joint pain or stiffness.  LYMPHATICS: No enlarged nodes. No history of splenectomy.  PSYCHIATRIC: No history of depression or anxiety.  ENDOCRINOLOGIC: No reports of sweating, cold or heat intolerance. No polyuria or polydipsia.  Marland Kitchen   Physical Examination There were no vitals filed for this visit. There were no vitals filed for this visit.  Gen: resting comfortably, no acute distress HEENT: no scleral icterus, pupils equal round and reactive, no palptable cervical adenopathy,  CV Resp: Clear to auscultation bilaterally GI: abdomen is soft, non-tender, non-distended, normal bowel sounds, no hepatosplenomegaly MSK: extremities are warm, no edema.  Skin: warm, no rash Neuro:  no focal deficits Psych: appropriate affect   Diagnostic Studies  Study Conclusions   - Left ventricle: The cavity size was normal. Wall thickness was   increased in a  pattern of moderate LVH. Systolic function was   normal. The estimated ejection fraction was in the range of 60%   to 65%. Doppler parameters are consistent with abnormal left   ventricular relaxation (grade 1 diastolic dysfunction). - Aortic valve: Mildly calcified annulus. Trileaflet; mildly   thickened leaflets. Valve area (VTI): 2.5 cm^2. Valve area   (Vmax): 2.38 cm^2. - Mitral valve: Mildly calcified annulus. Mildly thickened leaflets   . There was mild regurgitation. - Technically adequate study.     12/2021 echo IMPRESSIONS     1. Left ventricular ejection fraction, by estimation, is 50 to 55%. The  left ventricle has low normal function. Left ventricular endocardial  border not optimally defined to evaluate regional wall motion. Left  ventricular diastolic function could not be  evaluated. There is the interventricular septum is flattened in systole  and diastole, consistent with right ventricular pressure and volume  overload.   2. Right ventricular systolic function is severely reduced. The right  ventricular size is severely enlarged.   3. Left atrial size was severely dilated.   4. Right atrial size was severely dilated.  5. The mitral valve is grossly normal. Mild mitral valve regurgitation.  No evidence of mitral stenosis.   6. The aortic valve is tricuspid. There is mild calcification of the  aortic valve. Aortic valve regurgitation is trivial. No aortic stenosis is  present.   7. Aortic dilatation noted. There is mild dilatation of the aortic root,  measuring 40 mm.    Assessment and Plan   1. Afib - no symptoms, continue current meds   2. HFimpEF - LVEF has normalized, no symptoms - continue current meds   3.RV failure/pulmonary hypertension - denies symptoms - unclear etiology - he does have history of left sided dysfunction, both systolic and diastolic - OSA testing coming up - check PFTs. Prior PE in 2011, check VQ scan for possibly CTEPH -may  warrant RHC in the near future     Antoine Poche, M.D., F.A.C.C.

## 2022-11-17 ENCOUNTER — Encounter: Payer: Self-pay | Admitting: Cardiology

## 2022-11-17 ENCOUNTER — Encounter: Payer: Self-pay | Admitting: Dermatology

## 2022-11-23 IMAGING — CT CT CHEST W/ CM
3 of 5 series · 14 of 36 positions shown, 16 images · IV contrast (Omnipaque or Isovue)
Comparison: Chest CTA 08/21/2009. Endoscopy and colonoscopy reports
from yesterday.

CLINICAL DATA: New anal mass. Evaluate for metastatic disease.
Lower extremity edema for 1 month.

EXAM:
CT CHEST, ABDOMEN, AND PELVIS WITH CONTRAST
TECHNIQUE: Multidetector CT imaging of the chest, abdomen and pelvis was
performed following the standard protocol during bolus
administration of intravenous contrast.
CONTRAST:  100mL OMNIPAQUE IOHEXOL 300 MG/ML  SOLN

[Series 2: cap with · axial · 0.89mm/px · z∈[-546,-11]mm · 8 of 139 slices shown, 10 images]
[im 16/139  mediastinal]
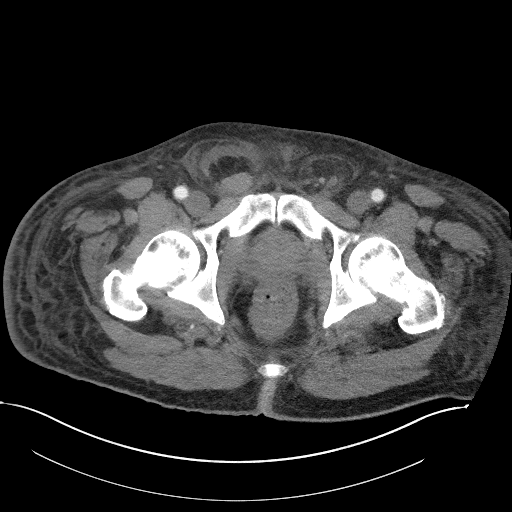
[im 16/139  lung]
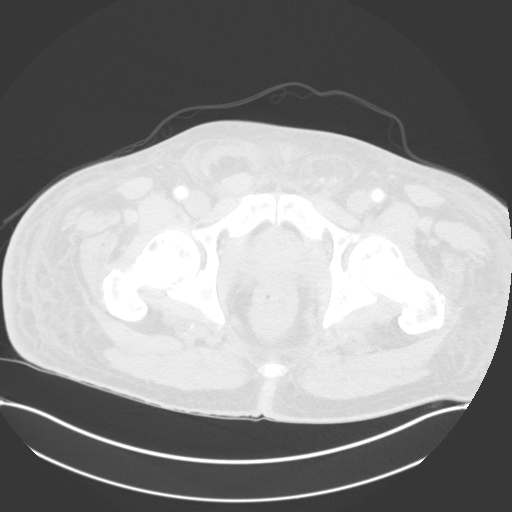
[im 31/139  lung]
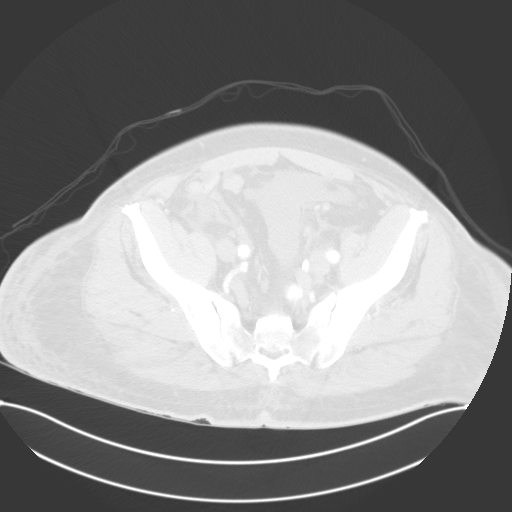
[im 47/139  lung]
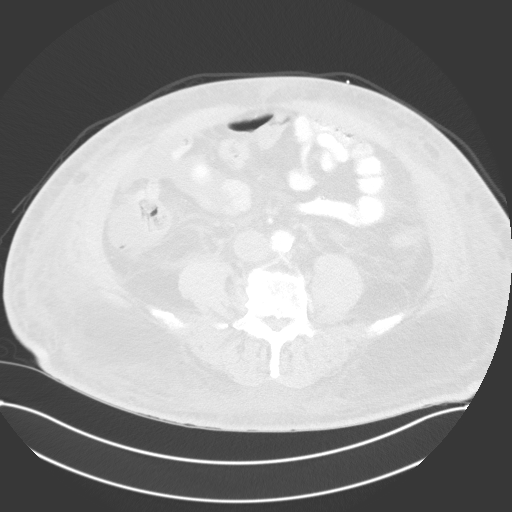
[im 62/139  lung]
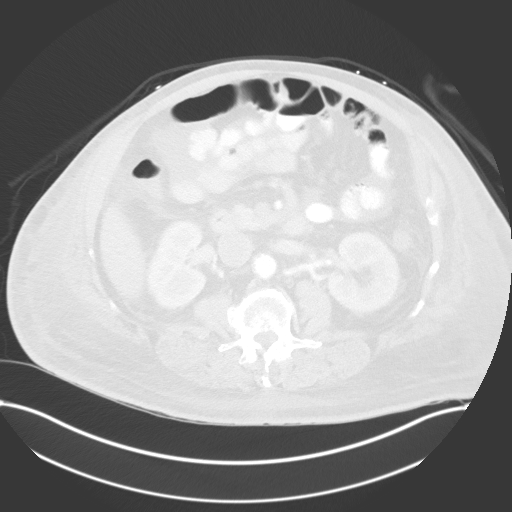
[im 77/139  mediastinal]
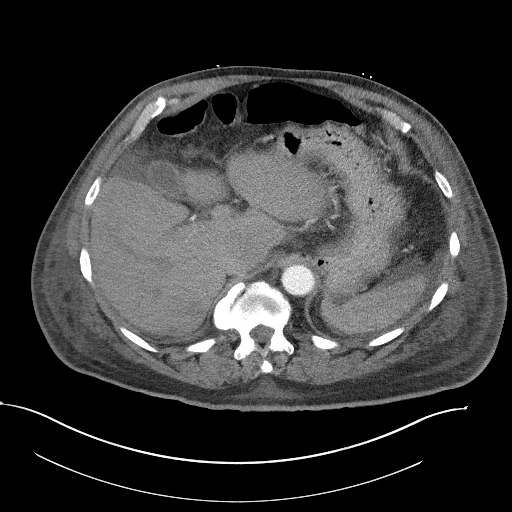
[im 77/139  lung]
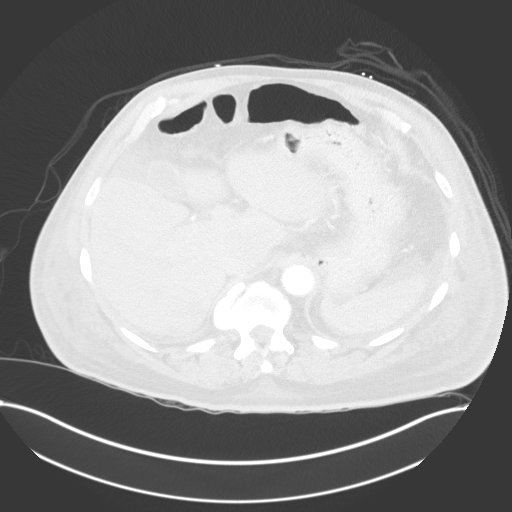
[im 93/139  lung]
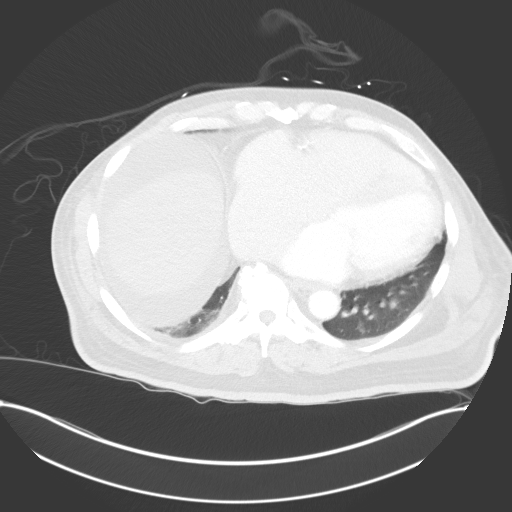
[im 108/139  lung]
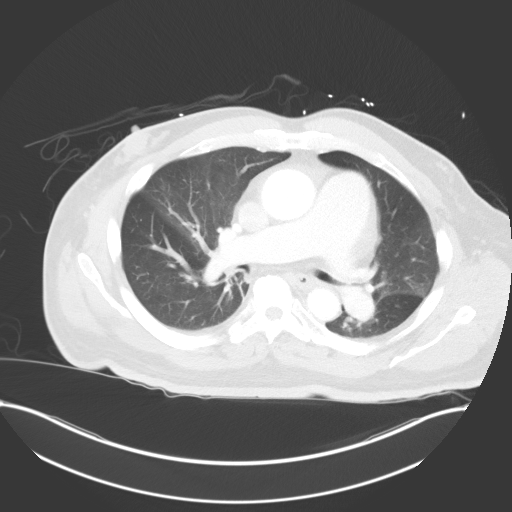
[im 123/139  lung]
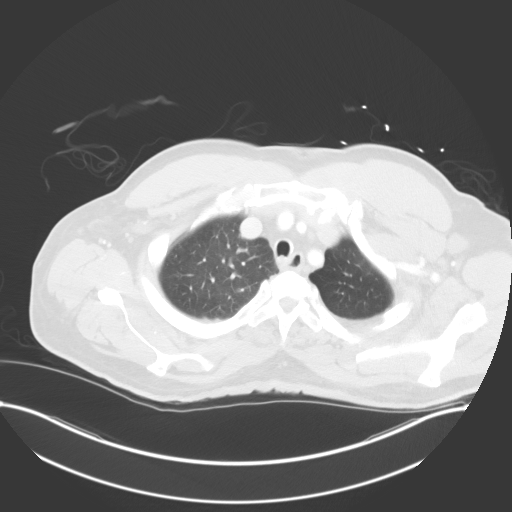

[Series 4: lung · axial · 0.89mm/px · z∈[-297,-207]mm · 3 of 199 slices shown]
[im 16/199  lung]
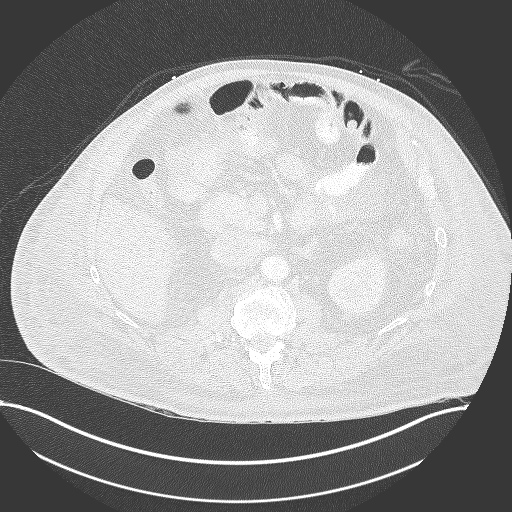
[im 46/199  lung]
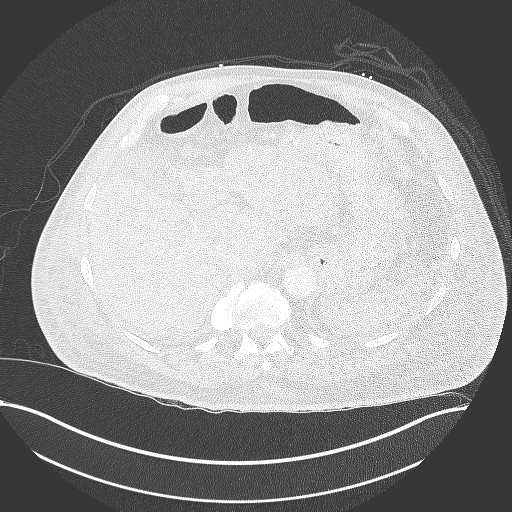
[im 61/199  lung]
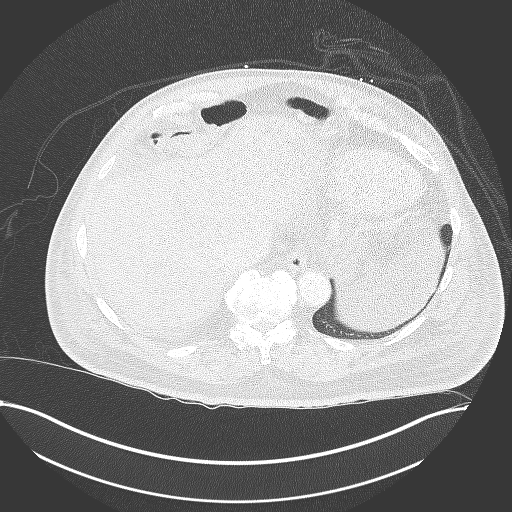

[Series 5: coronals · coronal · 1.06mm/px · 3 of 161 slices shown]
[im 33/161  lung]
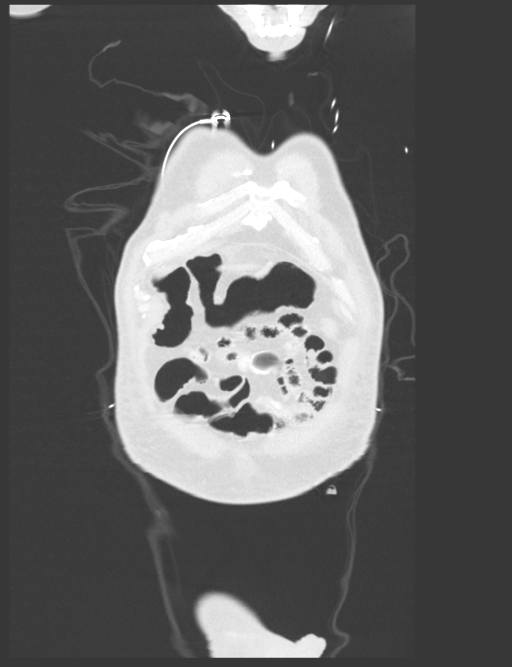
[im 65/161  lung]
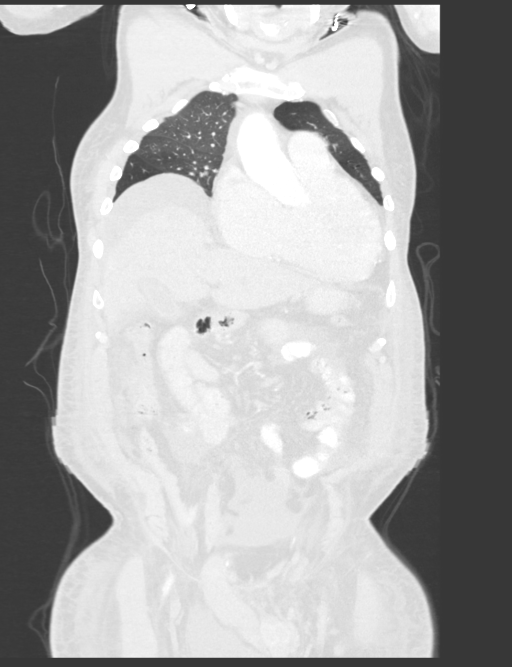
[im 97/161  lung]
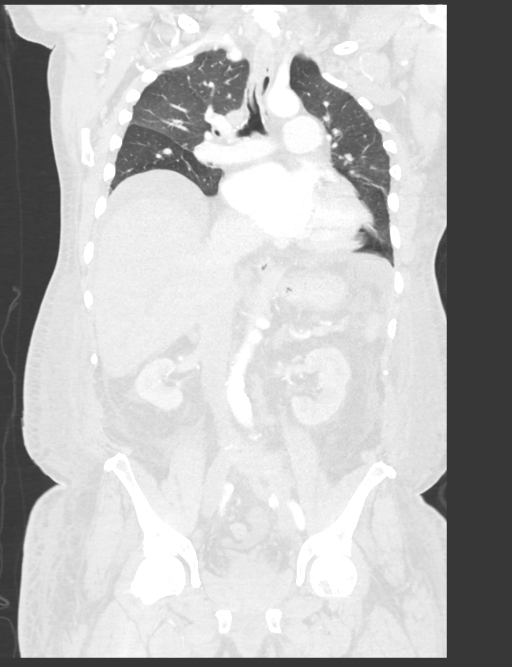

[14 of 36 positions shown; findings below may reference images not displayed]

FINDINGS: CT CHEST FINDINGS

Cardiovascular: Atherosclerosis of the aorta, great vessels and
coronary arteries. There is central enlargement of the pulmonary
arteries consistent with pulmonary arterial hypertension. A small
linear filling defect in the right lower lobe pulmonary artery
(image [DATE] reflect a web related to prior pulmonary embolism.
No signs of recurrent acute pulmonary embolism. The heart is
moderately enlarged. No significant pericardial effusion.

Mediastinum/Nodes: There are no enlarged mediastinal, hilar or
axillary lymph nodes. The thyroid gland, trachea and esophagus
demonstrate no significant findings.

Lungs/Pleura: No pleural effusion or pneumothorax. Mild
emphysematous changes and mild dependent atelectasis at both lung
bases. No suspicious pulmonary nodularity.

Musculoskeletal/Chest wall: No chest wall mass or suspicious osseous
findings. Progressive advanced right glenohumeral arthropathy.
Moderate bilateral gynecomastia.

CT ABDOMEN AND PELVIS FINDINGS

Hepatobiliary: On the initial images through the liver, the portal
vein is not yet opacified. Delayed phase images include the entire
liver and demonstrate several low-density well-circumscribed lesions
in the right lobe which are likely cysts, largest in the dome
measuring 14 mm on image [DATE]. There is an ill-defined low-density
lesion in the left lobe measuring 0.9 cm on image [DATE] which is
indeterminate. The gallbladder is incompletely distended. No
evidence of gallstones, gallbladder wall thickening or biliary
dilatation.

Pancreas: Unremarkable. No pancreatic ductal dilatation or
surrounding inflammatory changes.

Spleen: Normal in size without focal abnormality.

Adrenals/Urinary Tract: Both adrenal glands appear normal. The
kidneys appear normal, without evidence of urinary tract calculus,
mass lesion or hydronephrosis. There is symmetric perinephric soft
tissue stranding bilaterally. A significant portion of the bladder
extends into a right inguinal hernia. The bladder is decompressed.

Stomach/Bowel: Enteric contrast was administered and has passed into
the proximal colon. The stomach appears unremarkable for its degree
of distension. No evidence of bowel wall thickening, distention or
surrounding inflammatory change. The distal colon is decompressed.
No obvious anorectal mass. The anus is incompletely visualized.

Vascular/Lymphatic: There are no enlarged abdominal or pelvic lymph
nodes. Small retroperitoneal lymph nodes are not pathologically
enlarged. There are small partially calcified lymph nodes in the
porta hepatis. There is aortic and branch vessel atherosclerosis
without acute vascular findings.

Reproductive: Mild enlargement of the prostate gland.

Other: There is a large right inguinal hernia which is incompletely
visualized. There is fluid and a portion of the bladder within this
hernia sac. No herniated bowel. There is a smaller left inguinal
hernia containing only fat. There is a moderate amount of ascites.
There is generalized edema throughout the intra-abdominal and
subcutaneous fat. No focal extraluminal fluid or air collections are
seen.

Musculoskeletal: No acute or significant osseous findings. Multiple
old right-sided transverse process fractures are seen within the
lumbar spine. There is multilevel spondylosis.
IMPRESSION: 1. No obvious anorectal mass. The anus is incompletely visualized.
Correlate clinically.
2. No findings highly suspicious for metastatic disease. There is an
indeterminate 9 mm low-density lesion in left hepatic lobe.
3. Ascites with generalized soft tissue edema.
4. Moderate-sized right inguinal hernia containing fluid and a
portion of the urinary bladder.
5. Coronary and Aortic Atherosclerosis ([id number]). Cardiomegaly
with central enlargement of the pulmonary arteries consistent with
pulmonary arterial hypertension.
6. Old lumbar spine transverse process fractures, spondylosis and
right glenohumeral arthropathy.

## 2022-11-30 NOTE — Progress Notes (Deleted)
Referring Provider: Kirstie Peri, MD Primary Care Physician:  Kirstie Peri, MD Primary GI Physician: Dr. Marletta Lor  No chief complaint on file.   HPI:   Dennis Swanson is a 67 y.o. male presenting today for follow-up.  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 Saw Dr.  Michaell Cowing January 2023. Suspected the mass prolapsing from patient's anus was likely a chronically irritated hemorrhoid.  Ultimately underwent hemorrhoidectomy.  Also intermittent bilateral inguinal hernia repair.  Hemoglobin and iron panel returned to normal in February 2023.  Last seen in the office 05/20/2022.  GERD well-controlled on pantoprazole 40 mg daily.  No overt GI bleeding.  He had not been on oral iron. No other GI concerns. Recommended updating labs, continue pantoprazole, follow-up in 6 months.   Doesn't appear labs were ever completed.    Today:   Past Medical History:  Diagnosis Date   Acute pulmonary embolism (HCC) 02/16/2009   Alcohol abuse    Atrial flutter (HCC)    CHF (congestive heart failure) (HCC)    a. EF 40-45% in 2011 b. EF at 60-65% in 04/2015 c. EF at 45-50% by echo in 09/2020   Dysrhythmia    Hypertension    Lower extremity cellulitis    bilateral   Systolic dysfunction     Past Surgical History:  Procedure Laterality Date   COLONOSCOPY N/A 05/17/2012   Procedure: COLONOSCOPY;  Surgeon: West Bali, MD;  Location: [facility];  Service: Endoscopy;  Laterality: N/A;  10:30   COLONOSCOPY WITH PROPOFOL N/A 09/15/2020   Surgeon: Earnest Bailey K, DO;  2 cm  rubbery textured nodular anal mass, internal hemorrhoids, external hemorrhoids, sigmoid and descending diverticula, redundant colon.  Pathology revealed polypoid rectal mucosa with acute and chronic inflammation with erosion, granular hyperplasia and macular isolation of lamina propria, consistent with mucosal prolapse.   ESOPHAGOGASTRODUODENOSCOPY (EGD) WITH PROPOFOL N/A 09/15/2020   Surgeon: Lanelle Bal, DO; Small hiatal hernia, gastritis biopsied, normal examined duodenum.  Biopsy with no specific histopathologic changes, negative for H. pylori.   EVALUATION UNDER ANESTHESIA WITH HEMORRHOIDECTOMY N/A 04/17/2021   Procedure: HEMORRHOIDECTOMY WITH LIGATION AND HEMORRHOIDOPEXY, ANORECTAL EXAMINATION UNDER ANESTHESIA, EXCISION OF PROLAPSE ANAL MASS, EXCISON OF GRADE 3 AND 4 PROLAPSING HEMORRHOIDS;  Surgeon: Karie Soda, MD;  Location: [facility];  Service: General;  Laterality: N/A;   INGUINAL HERNIA REPAIR Right 06/20/2021   Procedure: LAPAROSCOPIC RIGHT AND LEFT INGUINAL HERNIA;  Surgeon: Karie Soda, MD;  Location: [facility];  Service: General;  Laterality: Right;   None     TONSILLECTOMY      Current Outpatient Medications  Medication Sig Dispense Refill   apixaban (ELIQUIS) 5 MG TABS tablet Take 1 tablet (5 mg total) by mouth 2 (two) times daily. 60 tablet 1   carvedilol (COREG) 6.25 MG tablet Take 1 tablet (6.25 mg total) by mouth 2 (two) times daily with a meal. 60 tablet 6   ENTRESTO 24-26 MG Take 1 tablet by mouth twice daily 180 tablet 0   furosemide (LASIX) 40 MG tablet Take 1 tablet by mouth  twice daily 180 tablet 2   naloxone (NARCAN) nasal spray 4 mg/0.1 mL      oxyCODONE-acetaminophen (PERCOCET) 10-325 MG tablet Take 1 tablet by mouth 3 (three) times daily as needed.     pantoprazole (PROTONIX) 40 MG tablet TAKE 1 TABLET BY MOUTH ONCE DAILY BEFORE BREAKFAST 90 tablet 3   spironolactone (ALDACTONE) 25 MG tablet TAKE 1 TABLET BY MOUTH ONCE DAILY . APPOINTMENT REQUIRED FOR FUTURE  REFILLS 90 tablet 3   No current facility-administered medications for this visit.    Allergies as of 12/03/2022   (No Known Allergies)    Family History  Problem Relation Age of Onset   Heart attack Mother    Hypertension Brother    Diabetes Brother    Cancer Father    Colon cancer Neg Hx     Social History   Socioeconomic History   Marital status: Single    Spouse name: Not on file   Number of children: Not on file   Years of education: Not on file   Highest education level: Not on file  Occupational History   Occupation: Unemployed, previuosly yard Arts administrator nd Optometrist  Tobacco Use   Smoking status: Never   Smokeless tobacco: Never  Vaping Use   Vaping status: Never Used  Substance and Sexual Activity   Alcohol use: Yes    Alcohol/week: 3.0 standard drinks of alcohol    Types: 3 Cans of beer per week    Comment: 1-2 beer a week   Drug use: No   Sexual activity: Not Currently  Other Topics Concern   Not on file  Social History Narrative   Lives alone, 2 daughters   No regular exercise   Social Determinants of Health   Financial Resource Strain: Low Risk  (10/02/2020)   Overall Financial Resource Strain (CARDIA)    Difficulty of Paying Living Expenses: Not very hard  Food Insecurity: No Food Insecurity (10/02/2020)   Hunger Vital Sign    Worried About Running Out of Food in the Last Year: Never true    Ran Out of Food in the Last Year: Never true  Transportation Needs: No Transportation Needs (10/02/2020)   PRAPARE - Administrator, Civil Service (Medical): No    Lack of Transportation (Non-Medical): No  Physical Activity: Inactive (10/02/2020)   Exercise Vital Sign    Days of Exercise per Week: 0 days    Minutes of Exercise per Session: 0 min  Stress: Not on file  Social Connections: Not on file    Review of Systems: Gen: Denies fever, chills, anorexia. Denies fatigue, weakness, weight loss.  CV: Denies chest pain, palpitations, syncope,  peripheral edema, and claudication. Resp: Denies dyspnea at rest, cough, wheezing, coughing up blood, and pleurisy. GI: Denies vomiting blood, jaundice, and fecal incontinence.   Denies dysphagia or odynophagia. Derm: Denies rash, itching, dry skin Psych: Denies depression, anxiety, memory loss, confusion. No homicidal or suicidal ideation.  Heme: Denies bruising, bleeding, and enlarged lymph nodes.  Physical Exam: There were no vitals taken for this visit. General:   Alert and oriented. No distress noted. Pleasant and cooperative.  Head:  Normocephalic and atraumatic. Eyes:  Conjuctiva clear without scleral icterus. Heart:  S1, S2 present without murmurs appreciated. Lungs:  Clear to auscultation bilaterally. No wheezes, rales, or rhonchi. No distress.  Abdomen:  +BS, soft, non-tender and non-distended. No rebound or guarding. No HSM or masses noted. Msk:  Symmetrical without gross deformities. Normal posture. Extremities:  Without edema. Neurologic:  Alert and  oriented x4 Psych:  Normal mood and affect.    Assessment:     Plan:  ***   Ermalinda Memos, PA-C [facility] 12/03/2022

## 2022-12-03 ENCOUNTER — Ambulatory Visit: Payer: 59 | Admitting: Gastroenterology

## 2022-12-07 ENCOUNTER — Inpatient Hospital Stay (HOSPITAL_COMMUNITY)
Admission: EM | Admit: 2022-12-07 | Discharge: 2022-12-11 | DRG: 291 | Disposition: A | Discharge status: Home Health | Payer: 59 | Attending: Internal Medicine | Admitting: Internal Medicine

## 2022-12-07 ENCOUNTER — Other Ambulatory Visit: Payer: Self-pay

## 2022-12-07 ENCOUNTER — Emergency Department (HOSPITAL_COMMUNITY): Payer: 59

## 2022-12-07 ENCOUNTER — Encounter (HOSPITAL_COMMUNITY): Payer: Self-pay

## 2022-12-07 DIAGNOSIS — Z602 Problems related to living alone: Secondary | ICD-10-CM | POA: Diagnosis present

## 2022-12-07 DIAGNOSIS — F1010 Alcohol abuse, uncomplicated: Secondary | ICD-10-CM | POA: Diagnosis present

## 2022-12-07 DIAGNOSIS — I77819 Aortic ectasia, unspecified site: Secondary | ICD-10-CM | POA: Diagnosis present

## 2022-12-07 DIAGNOSIS — Z560 Unemployment, unspecified: Secondary | ICD-10-CM

## 2022-12-07 DIAGNOSIS — I454 Nonspecific intraventricular block: Secondary | ICD-10-CM | POA: Diagnosis present

## 2022-12-07 DIAGNOSIS — Z8249 Family history of ischemic heart disease and other diseases of the circulatory system: Secondary | ICD-10-CM

## 2022-12-07 DIAGNOSIS — N182 Chronic kidney disease, stage 2 (mild): Secondary | ICD-10-CM | POA: Diagnosis present

## 2022-12-07 DIAGNOSIS — I480 Paroxysmal atrial fibrillation: Secondary | ICD-10-CM | POA: Diagnosis present

## 2022-12-07 DIAGNOSIS — M7989 Other specified soft tissue disorders: Secondary | ICD-10-CM | POA: Diagnosis present

## 2022-12-07 DIAGNOSIS — I5082 Biventricular heart failure: Secondary | ICD-10-CM | POA: Diagnosis present

## 2022-12-07 DIAGNOSIS — Z1152 Encounter for screening for COVID-19: Secondary | ICD-10-CM | POA: Diagnosis not present

## 2022-12-07 DIAGNOSIS — E785 Hyperlipidemia, unspecified: Secondary | ICD-10-CM | POA: Diagnosis present

## 2022-12-07 DIAGNOSIS — Z79899 Other long term (current) drug therapy: Secondary | ICD-10-CM

## 2022-12-07 DIAGNOSIS — Z789 Other specified health status: Secondary | ICD-10-CM

## 2022-12-07 DIAGNOSIS — I2729 Other secondary pulmonary hypertension: Secondary | ICD-10-CM | POA: Diagnosis present

## 2022-12-07 DIAGNOSIS — D509 Iron deficiency anemia, unspecified: Secondary | ICD-10-CM | POA: Diagnosis present

## 2022-12-07 DIAGNOSIS — I4891 Unspecified atrial fibrillation: Secondary | ICD-10-CM | POA: Diagnosis present

## 2022-12-07 DIAGNOSIS — D696 Thrombocytopenia, unspecified: Secondary | ICD-10-CM | POA: Diagnosis present

## 2022-12-07 DIAGNOSIS — Z7901 Long term (current) use of anticoagulants: Secondary | ICD-10-CM

## 2022-12-07 DIAGNOSIS — I4892 Unspecified atrial flutter: Secondary | ICD-10-CM | POA: Diagnosis present

## 2022-12-07 DIAGNOSIS — J9601 Acute respiratory failure with hypoxia: Secondary | ICD-10-CM | POA: Diagnosis present

## 2022-12-07 DIAGNOSIS — I130 Hypertensive heart and chronic kidney disease with heart failure and stage 1 through stage 4 chronic kidney disease, or unspecified chronic kidney disease: Secondary | ICD-10-CM | POA: Diagnosis present

## 2022-12-07 DIAGNOSIS — E875 Hyperkalemia: Secondary | ICD-10-CM | POA: Diagnosis present

## 2022-12-07 DIAGNOSIS — J4489 Other specified chronic obstructive pulmonary disease: Secondary | ICD-10-CM | POA: Diagnosis present

## 2022-12-07 DIAGNOSIS — I50810 Right heart failure, unspecified: Secondary | ICD-10-CM

## 2022-12-07 DIAGNOSIS — G4733 Obstructive sleep apnea (adult) (pediatric): Secondary | ICD-10-CM | POA: Diagnosis present

## 2022-12-07 DIAGNOSIS — J9622 Acute and chronic respiratory failure with hypercapnia: Secondary | ICD-10-CM | POA: Diagnosis present

## 2022-12-07 DIAGNOSIS — I5043 Acute on chronic combined systolic (congestive) and diastolic (congestive) heart failure: Secondary | ICD-10-CM | POA: Diagnosis present

## 2022-12-07 DIAGNOSIS — E870 Hyperosmolality and hypernatremia: Secondary | ICD-10-CM | POA: Diagnosis not present

## 2022-12-07 DIAGNOSIS — I5022 Chronic systolic (congestive) heart failure: Secondary | ICD-10-CM | POA: Diagnosis present

## 2022-12-07 DIAGNOSIS — I5033 Acute on chronic diastolic (congestive) heart failure: Secondary | ICD-10-CM | POA: Diagnosis not present

## 2022-12-07 DIAGNOSIS — R600 Localized edema: Principal | ICD-10-CM

## 2022-12-07 DIAGNOSIS — G8929 Other chronic pain: Secondary | ICD-10-CM | POA: Diagnosis present

## 2022-12-07 DIAGNOSIS — I4811 Longstanding persistent atrial fibrillation: Secondary | ICD-10-CM | POA: Diagnosis not present

## 2022-12-07 DIAGNOSIS — E669 Obesity, unspecified: Secondary | ICD-10-CM | POA: Diagnosis present

## 2022-12-07 DIAGNOSIS — N179 Acute kidney failure, unspecified: Secondary | ICD-10-CM | POA: Diagnosis present

## 2022-12-07 DIAGNOSIS — I4821 Permanent atrial fibrillation: Secondary | ICD-10-CM | POA: Diagnosis not present

## 2022-12-07 DIAGNOSIS — E66812 Obesity, class 2: Secondary | ICD-10-CM | POA: Diagnosis present

## 2022-12-07 DIAGNOSIS — J9621 Acute and chronic respiratory failure with hypoxia: Secondary | ICD-10-CM | POA: Diagnosis present

## 2022-12-07 DIAGNOSIS — Z6837 Body mass index (BMI) 37.0-37.9, adult: Secondary | ICD-10-CM

## 2022-12-07 DIAGNOSIS — Z833 Family history of diabetes mellitus: Secondary | ICD-10-CM

## 2022-12-07 DIAGNOSIS — I10 Essential (primary) hypertension: Secondary | ICD-10-CM | POA: Diagnosis present

## 2022-12-07 DIAGNOSIS — Z86711 Personal history of pulmonary embolism: Secondary | ICD-10-CM | POA: Diagnosis not present

## 2022-12-07 LAB — HEPATIC FUNCTION PANEL
ALT: 20 U/L (ref 0–44)
AST: 20 U/L (ref 15–41)
Albumin: 3.4 g/dL — ABNORMAL LOW (ref 3.5–5.0)
Alkaline Phosphatase: 45 U/L (ref 38–126)
Bilirubin, Direct: 0.2 mg/dL (ref 0.0–0.2)
Indirect Bilirubin: 0.6 mg/dL (ref 0.3–0.9)
Total Bilirubin: 0.8 mg/dL (ref 0.3–1.2)
Total Protein: 6.3 g/dL — ABNORMAL LOW (ref 6.5–8.1)

## 2022-12-07 LAB — I-STAT CHEM 8, ED
BUN: 34 mg/dL — ABNORMAL HIGH (ref 8–23)
Calcium, Ion: 1.08 mmol/L — ABNORMAL LOW (ref 1.15–1.40)
Chloride: 96 mmol/L — ABNORMAL LOW (ref 98–111)
Creatinine, Ser: 1.6 mg/dL — ABNORMAL HIGH (ref 0.61–1.24)
Glucose, Bld: 93 mg/dL (ref 70–99)
HCT: 49 % (ref 39.0–52.0)
Hemoglobin: 16.7 g/dL (ref 13.0–17.0)
Potassium: 4.3 mmol/L (ref 3.5–5.1)
Sodium: 144 mmol/L (ref 135–145)
TCO2: 39 mmol/L — ABNORMAL HIGH (ref 22–32)

## 2022-12-07 LAB — BASIC METABOLIC PANEL
Anion gap: 8 (ref 5–15)
BUN: 29 mg/dL — ABNORMAL HIGH (ref 8–23)
CO2: 40 mmol/L — ABNORMAL HIGH (ref 22–32)
Calcium: 8.9 mg/dL (ref 8.9–10.3)
Chloride: 95 mmol/L — ABNORMAL LOW (ref 98–111)
Creatinine, Ser: 1.65 mg/dL — ABNORMAL HIGH (ref 0.61–1.24)
GFR, Estimated: 45 mL/min — ABNORMAL LOW (ref >60)
Glucose, Bld: 99 mg/dL (ref 70–99)
Potassium: 4.3 mmol/L (ref 3.5–5.1)
Sodium: 143 mmol/L (ref 135–145)

## 2022-12-07 LAB — CBC
HCT: 48.1 % (ref 39.0–52.0)
Hemoglobin: 14.5 g/dL (ref 13.0–17.0)
MCH: 30 pg (ref 26.0–34.0)
MCHC: 30.1 g/dL (ref 30.0–36.0)
MCV: 99.4 fL (ref 80.0–100.0)
Platelets: 117 10*3/uL — ABNORMAL LOW (ref 150–400)
RBC: 4.84 MIL/uL (ref 4.22–5.81)
RDW: 14.2 % (ref 11.5–15.5)
WBC: 4.7 10*3/uL (ref 4.0–10.5)
nRBC: 0 % (ref 0.0–0.2)

## 2022-12-07 LAB — BLOOD GAS, VENOUS
Acid-Base Excess: 17.2 mmol/L — ABNORMAL HIGH (ref 0.0–2.0)
Bicarbonate: 49.8 mmol/L — ABNORMAL HIGH (ref 20.0–28.0)
O2 Saturation: 22 %
Patient temperature: 37
pCO2, Ven: 111 mm[Hg] (ref 44–60)
pH, Ven: 7.26 (ref 7.25–7.43)
pO2, Ven: <31 mm[Hg] — CL (ref 32–45)

## 2022-12-07 LAB — BRAIN NATRIURETIC PEPTIDE: B Natriuretic Peptide: 533.6 pg/mL — ABNORMAL HIGH (ref 0.0–100.0)

## 2022-12-07 LAB — RESP PANEL BY RT-PCR (RSV, FLU A&B, COVID)  RVPGX2
Influenza A by PCR: NEGATIVE
Influenza B by PCR: NEGATIVE
Resp Syncytial Virus by PCR: NEGATIVE
SARS Coronavirus 2 by RT PCR: NEGATIVE

## 2022-12-07 LAB — HIV ANTIBODY (ROUTINE TESTING W REFLEX): HIV Screen 4th Generation wRfx: NONREACTIVE

## 2022-12-07 LAB — TROPONIN I (HIGH SENSITIVITY)
Troponin I (High Sensitivity): 31 ng/L — ABNORMAL HIGH (ref <18)
Troponin I (High Sensitivity): 30 ng/L — ABNORMAL HIGH (ref <18)

## 2022-12-07 LAB — MAGNESIUM: Magnesium: 1.8 mg/dL (ref 1.7–2.4)

## 2022-12-07 MED ORDER — ACETAMINOPHEN 325 MG PO TABS
650.0000 mg | ORAL_TABLET | ORAL | Status: DC | PRN
Start: 2022-12-07 — End: 2022-12-11

## 2022-12-07 MED ORDER — OXYCODONE HCL 5 MG PO TABS: 10.0000 mg | ORAL_TABLET | Freq: Four times a day (QID) | ORAL | Status: DC | PRN

## 2022-12-07 MED ORDER — PANTOPRAZOLE SODIUM 40 MG PO TBEC
40.0000 mg | DELAYED_RELEASE_TABLET | Freq: Every day | ORAL | Status: DC
  Administered 2022-12-08 – 2022-12-11 (×4): 40 mg via ORAL
  Filled 2022-12-07 (×4): qty 1

## 2022-12-07 MED ORDER — SODIUM CHLORIDE 0.9% FLUSH
3.0000 mL | Freq: Two times a day (BID) | INTRAVENOUS | Status: DC
  Administered 2022-12-07 – 2022-12-11 (×9): 3 mL via INTRAVENOUS

## 2022-12-07 MED ORDER — ACETAMINOPHEN 325 MG PO TABS: 650.0000 mg | ORAL_TABLET | Freq: Four times a day (QID) | ORAL | Status: DC | PRN

## 2022-12-07 MED ORDER — SPIRONOLACTONE 25 MG PO TABS: 25.0000 mg | ORAL_TABLET | Freq: Every day | ORAL | Status: DC

## 2022-12-07 MED ORDER — OXYCODONE-ACETAMINOPHEN 10-325 MG PO TABS: 1.0000 | ORAL_TABLET | Freq: Three times a day (TID) | ORAL | Status: DC | PRN

## 2022-12-07 MED ORDER — SODIUM CHLORIDE 0.9% FLUSH: 3.0000 mL | INTRAVENOUS | Status: DC | PRN

## 2022-12-07 MED ORDER — ONDANSETRON HCL 4 MG/2ML IJ SOLN: 4.0000 mg | Freq: Four times a day (QID) | INTRAMUSCULAR | Status: DC | PRN

## 2022-12-07 MED ORDER — APIXABAN 5 MG PO TABS
5.0000 mg | ORAL_TABLET | Freq: Two times a day (BID) | ORAL | Status: DC
  Administered 2022-12-07 – 2022-12-11 (×8): 5 mg via ORAL
  Filled 2022-12-07 (×8): qty 1

## 2022-12-07 MED ORDER — CARVEDILOL 6.25 MG PO TABS
6.2500 mg | ORAL_TABLET | Freq: Two times a day (BID) | ORAL | Status: DC
  Administered 2022-12-08: 6.25 mg via ORAL
  Filled 2022-12-07 (×2): qty 1

## 2022-12-07 MED ORDER — FUROSEMIDE 10 MG/ML IJ SOLN
60.0000 mg | Freq: Once | INTRAMUSCULAR | Status: AC
  Administered 2022-12-07: 60 mg via INTRAVENOUS
  Filled 2022-12-07: qty 6

## 2022-12-07 NOTE — Progress Notes (Signed)
   12/07/22 2245  Vent Select  Invasive or Noninvasive Noninvasive  Adult Vent Y  Adult Ventilator Settings  Vent Type Servo i  Vent Mode BIPAP;PCV  Set Rate 15 bmp  FiO2 (%) 40 %  I Time 0.9 Sec(s)  IPAP 12 cmH20  EPAP 5 cmH20  Pressure Control 7 cmH20  PEEP 5 cmH20  Adult Ventilator Measurements  Peak Airway Pressure 12 L/min  Mean Airway Pressure 7 cmH20  Resp Rate Spontaneous 7 br/min  Resp Rate Total 22 br/min  Exhaled Vt 625 mL  Measured Ve 9.9 L  I:E Ratio Measured 1:1.9  Auto PEEP 0 cmH20  Total PEEP 5 cmH20  Adult Ventilator Alarms  Alarms On Y  Ve High Alarm 20 L/min  Ve Low Alarm 5 L/min  Resp Rate High Alarm 36 br/min  Resp Rate Low Alarm 10  PEEP Low Alarm 2 cmH2O  Press High Alarm 25 cmH2O  VAP Prevention  HOB> 30 Degrees Y

## 2022-12-07 NOTE — Progress Notes (Signed)
Patient arrived from ED.  VSS.  Sitting upright, eating dinner.  No complaints.    12/07/22 1816  Vitals  Temp 97.6 F (36.4 C)  Temp Source Oral  BP 117/72  MAP (mmHg) 87  BP Location Right Arm  BP Method Automatic  Patient Position (if appropriate) Lying  Pulse Rate 77  Pulse Rate Source Monitor  ECG Heart Rate 77  Resp (!) 22  Level of Consciousness  Level of Consciousness Alert  MEWS COLOR  MEWS Score Color Green  Oxygen Therapy  SpO2 94 %  O2 Device Nasal Cannula  O2 Flow Rate (L/min) 2 L/min  Pain Assessment  Pain Scale 0-10  Pain Score 0  MEWS Score  MEWS Temp 0  MEWS Systolic 0  MEWS Pulse 0  MEWS RR 1  MEWS LOC 0  MEWS Score 1

## 2022-12-07 NOTE — ED Provider Notes (Signed)
[place] [facility]ENC[facility] DEPARTMENT AT [name] Provider Note   CSN: [id number] Arrival date & time: 12/07/22  1129     History  Chief Complaint  Patient presents with   Leg Swelling    Dennis Swanson is a 67 y.o. male.  HPI Patient presents for leg swelling, shortness of breath, fatigue.  Medical history includes alcohol abuse, atrial fibrillation, CHF, HTN, PE, CKD, anemia.  Prescribe medications include Lasix, 40 mg twice daily, Eliquis, spironolactone, Entresto, Coreg.  He states that he has been taking his medication, including his Lasix, as prescribed.  He does weigh himself at home.  He feels that he is 30 pounds up from his regular weight.  In addition to his leg swelling, he feels that his abdomen is swollen as well.  His legs are painful if he is on his feet for a long time.  He denies any pain currently.  He denies any chest discomfort.  He does not wear oxygen at baseline.    Home Medications Prior to Admission medications   Medication Sig Start Date End Date Taking? Authorizing Provider  furosemide (LASIX) 40 MG tablet Take 1 tablet by mouth twice daily 10/07/22  Yes Branch, Dorothe Pea, MD  apixaban (ELIQUIS) 5 MG TABS tablet Take 1 tablet (5 mg total) by mouth 2 (two) times daily. 09/17/20   Catarina Hartshorn, MD  carvedilol (COREG) 6.25 MG tablet Take 1 tablet (6.25 mg total) by mouth 2 (two) times daily with a meal. 09/30/22   Antoine Poche, MD  ENTRESTO 24-26 MG Take 1 tablet by mouth twice daily 11/16/22   Antoine Poche, MD  naloxone [facility]) nasal spray 4 mg/0.1 mL  10/12/21   [provider]  oxyCODONE-acetaminophen (PERCOCET) 10-325 MG tablet Take 1 tablet by mouth 3 (three) times daily as needed. 11/13/21   [provider]  pantoprazole (PROTONIX) 40 MG tablet TAKE 1 TABLET BY MOUTH ONCE DAILY BEFORE BREAKFAST 05/18/22   Tiffany Kocher, PA-C  spironolactone (ALDACTONE) 25 MG tablet TAKE 1 TABLET BY MOUTH ONCE DAILY . APPOINTMENT  REQUIRED FOR FUTURE REFILLS 01/01/22   Antoine Poche, MD      Allergies    Patient has no known allergies.    Review of Systems   Review of Systems  Constitutional:  Positive for fatigue.  Respiratory:  Positive for shortness of breath.   Cardiovascular:  Positive for leg swelling.  Gastrointestinal:  Positive for abdominal distention.  All other systems reviewed and are negative.   Physical Exam Updated Vital Signs BP (!) 139/93 (BP Location: Right Arm)   Pulse 75   Temp 98.2 F (36.8 C) (Oral)   Resp 19   Ht 6' (1.829 m)   Wt 115.7 kg   SpO2 94%   BMI 34.59 kg/m  Physical Exam Vitals and nursing note reviewed.  Constitutional:      General: He is not in acute distress.    Appearance: Normal appearance. He is well-developed. He is not ill-appearing, toxic-appearing or diaphoretic.  HENT:     Head: Normocephalic and atraumatic.     Right Ear: External ear normal.     Left Ear: External ear normal.     Nose: Nose normal.     Mouth/Throat:     Mouth: Mucous membranes are moist.  Eyes:     Extraocular Movements: Extraocular movements intact.     Conjunctiva/sclera: Conjunctivae normal.  Cardiovascular:     Rate and Rhythm: Normal rate and regular rhythm.  Heart sounds: No murmur heard. Pulmonary:     Effort: Pulmonary effort is normal. No respiratory distress.     Breath sounds: Normal breath sounds. No wheezing or rhonchi.  Abdominal:     General: There is distension.     Palpations: Abdomen is soft.     Tenderness: There is no abdominal tenderness.  Musculoskeletal:        General: No swelling.     Cervical back: Normal range of motion and neck supple.     Right lower leg: Edema present.     Left lower leg: Edema present.  Skin:    General: Skin is warm and dry.     Coloration: Skin is not jaundiced or pale.  Neurological:     General: No focal deficit present.     Mental Status: He is alert and oriented to person, place, and time.  Psychiatric:         Mood and Affect: Mood normal.        Behavior: Behavior normal.     ED Results / Procedures / Treatments   Labs (all labs ordered are listed, but only abnormal results are displayed) Labs Reviewed  CBC - Abnormal; Notable for the following components:      Result Value   Platelets 117 (*)    All other components within normal limits  BASIC METABOLIC PANEL - Abnormal; Notable for the following components:   Chloride 95 (*)    CO2 40 (*)    BUN 29 (*)    Creatinine, Ser 1.65 (*)    GFR, Estimated 45 (*)    All other components within normal limits  HEPATIC FUNCTION PANEL - Abnormal; Notable for the following components:   Total Protein 6.3 (*)    Albumin 3.4 (*)    All other components within normal limits  I-STAT CHEM 8, ED - Abnormal; Notable for the following components:   Chloride 96 (*)    BUN 34 (*)    Creatinine, Ser 1.60 (*)    Calcium, Ion 1.08 (*)    TCO2 39 (*)    All other components within normal limits  TROPONIN I (HIGH SENSITIVITY) - Abnormal; Notable for the following components:   Troponin I (High Sensitivity) 31 (*)    All other components within normal limits  RESP PANEL BY RT-PCR (RSV, FLU A&B, COVID)  RVPGX2  MAGNESIUM  BRAIN NATRIURETIC PEPTIDE  BLOOD GAS, VENOUS  HIV ANTIBODY (ROUTINE TESTING W REFLEX)  URINALYSIS, ROUTINE W REFLEX MICROSCOPIC  TROPONIN I (HIGH SENSITIVITY)    EKG EKG Interpretation Date/Time:  Monday December 07 2022 12:17:18 EDT Ventricular Rate:  73 PR Interval:    QRS Duration:  100 QT Interval:  414 QTC Calculation: 456 R Axis:   255  Text Interpretation: Sinus rhythm Right superior axis deviation Incomplete right bundle branch block Abnormal ECG Confirmed by Gloris Manchester ([id number]) on 12/07/2022 12:31:13 PM  Radiology No results found.  Procedures Procedures    Medications Ordered in ED Medications  sodium chloride flush (NS) 0.9 % injection 3 mL (3 mLs Intravenous Given 12/07/22 1639)  sodium chloride flush  (NS) 0.9 % injection 3 mL (has no administration in time range)  acetaminophen (TYLENOL) tablet 650 mg (has no administration in time range)  ondansetron (ZOFRAN) injection 4 mg (has no administration in time range)  apixaban (ELIQUIS) tablet 5 mg (has no administration in time range)  carvedilol (COREG) tablet 6.25 mg (has no administration in time range)  pantoprazole (PROTONIX) EC  tablet 40 mg (has no administration in time range)  spironolactone (ALDACTONE) tablet 25 mg (has no administration in time range)  oxyCODONE (Oxy IR/ROXICODONE) immediate release tablet 10 mg (has no administration in time range)    And  acetaminophen (TYLENOL) tablet 650 mg (has no administration in time range)  furosemide (LASIX) injection 60 mg (60 mg Intravenous Given 12/07/22 1638)    ED Course/ Medical Decision Making/ A&P                                 Medical Decision Making Amount and/or Complexity of Data Reviewed Labs: ordered. Radiology: ordered.  Risk Prescription drug management. Decision regarding hospitalization.   This patient presents to the ED for concern of leg swelling and shortness of breath, this involves an extensive number of treatment options, and is a complaint that carries with it a high risk of complications and morbidity.  The differential diagnosis includes CHF exacerbation, DVT, cellulitis   Co morbidities that complicate the patient evaluation   alcohol abuse, atrial fibrillation, CHF, HTN, PE, CKD, anemia   Additional history obtained:  Additional history obtained from N/A External records from outside source obtained and reviewed including EMR   Lab Tests:  I Ordered, and personally interpreted labs.  The pertinent results include: Creatinine is increased from baseline; hemoglobin is normal, no leukocytosis is present.   Imaging Studies ordered:  I ordered imaging studies including chest x-ray I independently visualized and interpreted imaging which  showed (results pending at time of admission) I agree with the radiologist interpretation   Cardiac Monitoring: / EKG:  The patient was maintained on a cardiac monitor.  I personally viewed and interpreted the cardiac monitored which showed an underlying rhythm of: Sinus rhythm  Problem List / ED Course / Critical interventions / Medication management  Patient presenting for leg swelling and shortness of breath.  Although he is not on oxygen at baseline, he was noted to be hypoxic at 79% on room air on arrival in the ED.  He was placed on supplemental oxygen with resolution of hypoxia.  On exam, patient is overall well-appearing.  His breathing is unlabored.  He is able to speak in complete sentences.  He has pitting edema bilaterally.  Although he feels that his abdomen is distended, no tenderness is present.  I suspect failure of outpatient diuretics.  Laboratory workup was initiated.  Currently electrolytes are normal.  Dose of IV Lasix was ordered.  Patient was admitted for further management. I ordered medication including Lasix for diuresis Reevaluation of the patient after these medicines showed that the patient improved I have reviewed the patients home medicines and have made adjustments as needed   Social Determinants of Health:  Has access to outpatient care        Final Clinical Impression(s) / ED Diagnoses Final diagnoses:  Leg edema  Acute respiratory failure with hypoxia (HCC)  Failure of outpatient treatment    Rx / DC Orders ED Discharge Orders     None         Gloris Manchester, MD 12/07/22 1652

## 2022-12-07 NOTE — ED Notes (Signed)
ED TO INPATIENT HANDOFF REPORT  ED Nurse Name and Phone #: Tosca Pletz [phone]  S Name/Age/Gender Dennis Swanson 67 y.o. male Room/Bed: 037C/037C  Code Status   Code Status: Full Code  Home/SNF/Other Home Patient oriented to: self, place, time, and situation Is this baseline? Yes   Triage Complete: Triage complete  Chief Complaint Acute respiratory failure with hypoxia (HCC) [J96.01]  Triage Note Pt c/o bilat leg edemax3-4d. Pt c/o SOB. Pt has 2+ edema of legs bilat. Pt is eupneic.   Allergies No Known Allergies  Level of Care/Admitting Diagnosis ED Disposition     ED Disposition  Admit   Condition  --   Comment  Hospital Area: [facility] [[id number]]  Level of Care: Progressive [102]  Admit to Progressive based on following criteria: CARDIOVASCULAR & THORACIC of moderate stability with acute coronary syndrome symptoms/low risk myocardial infarction/hypertensive urgency/arrhythmias/heart failure potentially compromising stability and stable post cardiovascular intervention patients.  May admit patient to [facility] or [facility] if equivalent level of care is available:: No  Covid Evaluation: Asymptomatic - no recent exposure (last 10 days) testing not required  Diagnosis: Acute respiratory failure with hypoxia [facility]) [[id number]]  Admitting Physician: Clydie Braun [[id number]]  Attending Physician: Clydie Braun [[id number]]  Certification:: I certify this patient will need inpatient services for at least 2 midnights  Expected Medical Readiness: 12/09/2022          B Medical/Surgery History Past Medical History:  Diagnosis Date   Acute pulmonary embolism (HCC) 02/16/2009   Alcohol abuse    Atrial flutter (HCC)    CHF (congestive heart failure) (HCC)    a. EF 40-45% in 2011 b. EF at 60-65% in 04/2015 c. EF at 45-50% by echo in 09/2020   Dysrhythmia    Hypertension    Lower extremity cellulitis    bilateral   Systolic dysfunction    Past  Surgical History:  Procedure Laterality Date   COLONOSCOPY N/A 05/17/2012   Procedure: COLONOSCOPY;  Surgeon: West Bali, MD;  [facility];  Service: Endoscopy;  Laterality: N/A;  10:30   COLONOSCOPY WITH PROPOFOL N/A 09/15/2020   Surgeon: Earnest Bailey K, DO;  2 cm rubbery textured nodular anal mass, internal hemorrhoids, external hemorrhoids, sigmoid and descending diverticula, redundant colon.  Pathology revealed polypoid rectal mucosa with acute and chronic inflammation with erosion, granular hyperplasia and macular isolation of lamina propria, consistent with mucosal prolapse.   ESOPHAGOGASTRODUODENOSCOPY (EGD) WITH PROPOFOL N/A 09/15/2020   Surgeon: Lanelle Bal, DO; Small hiatal hernia, gastritis biopsied, normal examined duodenum.  Biopsy with no specific histopathologic changes, negative for H. pylori.   EVALUATION UNDER ANESTHESIA WITH HEMORRHOIDECTOMY N/A 04/17/2021   Procedure: HEMORRHOIDECTOMY WITH LIGATION AND HEMORRHOIDOPEXY, ANORECTAL EXAMINATION UNDER ANESTHESIA, EXCISION OF PROLAPSE ANAL MASS, EXCISON OF GRADE 3 AND 4 PROLAPSING HEMORRHOIDS;  Surgeon: Karie Soda, MD;  Location: [facility];  Service: General;  Laterality: N/A;   INGUINAL HERNIA REPAIR Right 06/20/2021   Procedure: LAPAROSCOPIC RIGHT AND LEFT INGUINAL HERNIA;  Surgeon: Karie Soda, MD;  Location: [facility];  Service: General;  Laterality: Right;   None     TONSILLECTOMY       A IV Location/Drains/Wounds Patient Lines/Drains/Airways Status     Active Line/Drains/Airways     Name Placement date Placement time Site Days   Peripheral IV 04/17/21 18 G 1.25" Left Forearm 04/17/21  1320  Forearm  599   Incision - 3 Ports Abdomen Umbilicus Right;Lateral Left;Lateral 06/20/21  --  --  535            Intake/Output Last 24 hours  Intake/Output Summary (Last 24 hours) at 12/07/2022 1732 Last data filed at 12/07/2022 1729 Gross per 24 hour  Intake --  Output 600 ml  Net -600 ml     Labs/Imaging Results for orders placed or performed during the hospital encounter of 12/07/22 (from the past 48 hour(s))  CBC     Status: Abnormal   Collection Time: 12/07/22 12:15 PM  Result Value Ref Range   WBC 4.7 4.0 - 10.5 K/uL   RBC 4.84 4.22 - 5.81 MIL/uL   Hemoglobin 14.5 13.0 - 17.0 g/dL   HCT 29.5 18.8 - 41.6 %   MCV 99.4 80.0 - 100.0 fL   MCH 30.0 26.0 - 34.0 pg   MCHC 30.1 30.0 - 36.0 g/dL   RDW 60.6 30.1 - 60.1 %   Platelets 117 (L) 150 - 400 K/uL    Comment: REPEATED TO VERIFY   nRBC 0.0 0.0 - 0.2 %    Comment: Performed at [facility], [address] [place], [place] [address]  Basic metabolic panel     Status: Abnormal   Collection Time: 12/07/22 12:42 PM  Result Value Ref Range   Sodium 143 135 - 145 mmol/L   Potassium 4.3 3.5 - 5.1 mmol/L   Chloride 95 (L) 98 - 111 mmol/L   CO2 40 (H) 22 - 32 mmol/L   Glucose, Bld 99 70 - 99 mg/dL    Comment: Glucose reference range applies only to samples taken after fasting for at least 8 hours.   BUN 29 (H) 8 - 23 mg/dL   Creatinine, Ser 5.57 (H) 0.61 - 1.24 mg/dL   Calcium 8.9 8.9 - 32.2 mg/dL   GFR, Estimated 45 (L) >60 mL/min    Comment: (NOTE) Calculated using the CKD-EPI Creatinine Equation (2021)    Anion gap 8 5 - 15    Comment: Performed at [facility], [address], [place], [place] [address]  Magnesium     Status: None   Collection Time: 12/07/22 12:42 PM  Result Value Ref Range   Magnesium 1.8 1.7 - 2.4 mg/dL    Comment: Performed at [facility], [address], [place], [place] [address]  Hepatic function panel     Status: Abnormal   Collection Time: 12/07/22 12:42 PM  Result Value Ref Range   Total Protein 6.3 (L) 6.5 - 8.1 g/dL   Albumin 3.4 (L) 3.5 - 5.0 g/dL   AST 20 15 - 41 U/L   ALT 20 0 - 44 U/L   Alkaline Phosphatase 45 38 - 126 U/L   Total Bilirubin 0.8 0.3 - 1.2 mg/dL   Bilirubin, Direct 0.2 0.0 - 0.2 mg/dL   Indirect Bilirubin 0.6 0.3 - 0.9 mg/dL    Comment:  Performed at [facility], [address], [place], [place] [address]  Troponin I (High Sensitivity)     Status: Abnormal   Collection Time: 12/07/22 12:42 PM  Result Value Ref Range   Troponin I (High Sensitivity) 31 (H) <18 ng/L    Comment: (NOTE) Elevated high sensitivity troponin I (hsTnI) values and significant  changes across serial measurements may suggest ACS but many other  chronic and acute conditions are known to elevate hsTnI results.  Refer to the "Links" section for chest pain algorithms and additional  guidance. Performed at [facility], [address] [place],  [place] [address]   Resp panel by RT-PCR (RSV, Flu A&B, Covid) Anterior Nasal Swab     Status: None   Collection Time: 12/07/22 12:52 PM   Specimen: Anterior Nasal Swab  Result Value Ref Range   SARS Coronavirus 2 by RT PCR NEGATIVE NEGATIVE   Influenza A by PCR NEGATIVE NEGATIVE   Influenza B by PCR NEGATIVE NEGATIVE    Comment: (NOTE) The Xpert Xpress SARS-CoV-2/FLU/RSV plus assay is intended as an aid in the diagnosis of influenza from Nasopharyngeal swab specimens and should not be used as a sole basis for treatment. Nasal washings and aspirates are unacceptable for Xpert Xpress SARS-CoV-2/FLU/RSV testing.  Fact Sheet for Patients: BloggerCourse.com  Fact Sheet for Healthcare Providers: SeriousBroker.it  This test is not yet approved or cleared by the [place] FDA and has been authorized for detection and/or diagnosis of SARS-CoV-2 by FDA under an Emergency Use Authorization (EUA). This EUA will remain in effect (meaning this test can be used) for the duration of the COVID-19 declaration under Section 564(b)(1) of the Act, 21 U.S.C. section 360bbb-3(b)(1), unless the authorization is terminated or revoked.     Resp Syncytial Virus by PCR NEGATIVE NEGATIVE    Comment: (NOTE) Fact Sheet for  Patients: BloggerCourse.com  Fact Sheet for Healthcare Providers: SeriousBroker.it  This test is not yet approved or cleared by the [place] FDA and has been authorized for detection and/or diagnosis of SARS-CoV-2 by FDA under an Emergency Use Authorization (EUA). This EUA will remain in effect (meaning this test can be used) for the duration of the COVID-19 declaration under Section 564(b)(1) of the Act, 21 U.S.C. section 360bbb-3(b)(1), unless the authorization is terminated or revoked.  Performed at [facility], [address] [place], [place] [address]   I-stat chem 8, ED (not at [facility], [facility] or [facility])     Status: Abnormal   Collection Time: 12/07/22 12:58 PM  Result Value Ref Range   Sodium 144 135 - 145 mmol/L   Potassium 4.3 3.5 - 5.1 mmol/L   Chloride 96 (L) 98 - 111 mmol/L   BUN 34 (H) 8 - 23 mg/dL   Creatinine, Ser 6.21 (H) 0.61 - 1.24 mg/dL   Glucose, Bld 93 70 - 99 mg/dL    Comment: Glucose reference range applies only to samples taken after fasting for at least 8 hours.   Calcium, Ion 1.08 (L) 1.15 - 1.40 mmol/L   TCO2 39 (H) 22 - 32 mmol/L   Hemoglobin 16.7 13.0 - 17.0 g/dL   HCT 30.8 65.7 - 84.6 %   DG Chest Port 1 View  Result Date: 12/07/2022 CLINICAL DATA:  Shortness of breath. EXAM: PORTABLE CHEST 1 VIEW COMPARISON:  July 31, 2022. FINDINGS: Stable cardiomegaly.  Lungs are clear.  Bony thorax is unremarkable. IMPRESSION: No active disease. Electronically Signed   By: Lupita Raider M.D.   On: 12/07/2022 17:16    Pending Labs Unresulted Labs (From admission, onward)     Start     Ordered   12/08/22 0500  Basic metabolic panel  Daily,   R     Comments: As Scheduled for 5 days    12/07/22 1444   12/07/22 1454  Urinalysis, Routine w reflex microscopic -Urine, Clean Catch  Once,   R       Question:  Specimen Source  Answer:  Urine, Clean Catch   12/07/22 1453   12/07/22 1439  HIV Antibody (routine  testing w rflx)  (HIV Antibody (  Routine testing w reflex) panel)  Once,   R        12/07/22 1444   12/07/22 1405  Blood gas, venous  ONCE - STAT,   STAT        12/07/22 1405   12/07/22 1242  Brain natriuretic peptide  Once,   URGENT        12/07/22 1241            Vitals/Pain Today's Vitals   12/07/22 1214 12/07/22 1658 12/07/22 1700 12/07/22 1731  BP:   (!) 137/92 136/74  Pulse:   81   Resp:   (!) 27 (!) 26  Temp:  98.4 F (36.9 C)  98.2 F (36.8 C)  TempSrc:    Oral  SpO2: 94%   96%  Weight:      Height:      PainSc:    0-No pain    Isolation Precautions No active isolations  Medications Medications  sodium chloride flush (NS) 0.9 % injection 3 mL (3 mLs Intravenous Given 12/07/22 1639)  sodium chloride flush (NS) 0.9 % injection 3 mL (has no administration in time range)  acetaminophen (TYLENOL) tablet 650 mg (has no administration in time range)  ondansetron (ZOFRAN) injection 4 mg (has no administration in time range)  apixaban (ELIQUIS) tablet 5 mg (has no administration in time range)  carvedilol (COREG) tablet 6.25 mg (has no administration in time range)  pantoprazole (PROTONIX) EC tablet 40 mg (has no administration in time range)  spironolactone (ALDACTONE) tablet 25 mg (has no administration in time range)  oxyCODONE (Oxy IR/ROXICODONE) immediate release tablet 10 mg (has no administration in time range)    And  acetaminophen (TYLENOL) tablet 650 mg (has no administration in time range)  furosemide (LASIX) injection 60 mg (60 mg Intravenous Given 12/07/22 1638)    Mobility walks     Focused Assessments Pulmonary Assessment Handoff:  Lung sounds:   O2 Device: Nasal Cannula O2 Flow Rate (L/min): 4 L/min    R Recommendations: See Admitting Provider Note  Report given to:   Additional Notes:  Voids in urinal. No complaints

## 2022-12-07 NOTE — ED Notes (Signed)
Placed pt on 6L 02 per [place]

## 2022-12-07 NOTE — Progress Notes (Signed)
Pt placed on Bipap for the night based on previous VVG and MD order for BIPAP at bedtime. Pt tolerating well.

## 2022-12-07 NOTE — ED Triage Notes (Signed)
Pt c/o bilat leg edemax3-4d. Pt c/o SOB. Pt has 2+ edema of legs bilat. Pt is eupneic.

## 2022-12-07 NOTE — H&P (Signed)
History and Physical    Patient: Dennis Swanson WUJ:[id number] DOB: Feb 04, 1956 DOA: 12/07/2022 DOS: the patient was seen and examined on 12/07/2022 PCP: Kirstie Peri, MD  Patient coming from: Home  Chief Complaint:  Chief Complaint  Patient presents with   Leg Swelling   HPI: Dennis Swanson is a 67 y.o. male with medical history significant of hypertension, dyslipidemia, systolic congestive heart failure, atrial fibrillation/flutter, pulmonary embolism, chronic pain, obesity, and chronic iron deficiency anemia who presents with c/o extremity swelling over the last 4 days.  Patient reports that he has been unable to put on his shoes due to the foot swelling and causes him to have pain at the bottom of his feet.  Notes associated symptoms of shortness of breath, abdominal distention, and weight gain possibly up to 30 pounds.  He has been taking all of his medications including Lasix as prescribed, denies missing any doses, and denied any recent medication changes.  Denies having any recent fever, chest pain, nausea, vomiting, diarrhea, or dysuria symptoms.  At baseline patient is not on oxygen.  Upon admission into the emergency department patient was noted to be afebrile with blood pressures 139/93, and O2 saturations as low as 79% with improvement on 6 L nasal cannula oxygen greater than 92%.  Labs noted platelets 117, CO2 40, BUN 29, and creatinine 1.65.  Chest x-ray appears to show peribronchial cuffing suggestive of edema, but the official radiology read pending.  Patient was ordered Lasix 60 mg IV.  Review of Systems: As mentioned in the history of present illness. All other systems reviewed and are negative. Past Medical History:  Diagnosis Date   Acute pulmonary embolism (HCC) 02/16/2009   Alcohol abuse    Atrial flutter (HCC)    CHF (congestive heart failure) (HCC)    a. EF 40-45% in 2011 b. EF at 60-65% in 04/2015 c. EF at 45-50% by echo in 09/2020   Dysrhythmia     Hypertension    Lower extremity cellulitis    bilateral   Systolic dysfunction    Past Surgical History:  Procedure Laterality Date   COLONOSCOPY N/A 05/17/2012   Procedure: COLONOSCOPY;  Surgeon: West Bali, MD;  [facility];  Service: Endoscopy;  Laterality: N/A;  10:30   COLONOSCOPY WITH PROPOFOL N/A 09/15/2020   Surgeon: Earnest Bailey K, DO;  2 cm rubbery textured nodular anal mass, internal hemorrhoids, external hemorrhoids, sigmoid and descending diverticula, redundant colon.  Pathology revealed polypoid rectal mucosa with acute and chronic inflammation with erosion, granular hyperplasia and macular isolation of lamina propria, consistent with mucosal prolapse.   ESOPHAGOGASTRODUODENOSCOPY (EGD) WITH PROPOFOL N/A 09/15/2020   Surgeon: Lanelle Bal, DO; Small hiatal hernia, gastritis biopsied, normal examined duodenum.  Biopsy with no specific histopathologic changes, negative for H. pylori.   EVALUATION UNDER ANESTHESIA WITH HEMORRHOIDECTOMY N/A 04/17/2021   Procedure: HEMORRHOIDECTOMY WITH LIGATION AND HEMORRHOIDOPEXY, ANORECTAL EXAMINATION UNDER ANESTHESIA, EXCISION OF PROLAPSE ANAL MASS, EXCISON OF GRADE 3 AND 4 PROLAPSING HEMORRHOIDS;  Surgeon: Karie Soda, MD;  Location: [facility];  Service: General;  Laterality: N/A;   INGUINAL HERNIA REPAIR Right 06/20/2021   Procedure: LAPAROSCOPIC RIGHT AND LEFT INGUINAL HERNIA;  Surgeon: Karie Soda, MD;  Location: [facility];  Service: General;  Laterality: Right;   None     TONSILLECTOMY     Social History:  reports that he has never smoked. He has never used smokeless tobacco. He reports current alcohol use of about 3.0 standard drinks of alcohol per  week. He reports that he does not use drugs.  No Known Allergies  Family History  Problem Relation Age of Onset   Heart attack Mother    Hypertension Brother    Diabetes Brother    Cancer Father    Colon cancer Neg Hx     Prior to Admission medications   Medication Sig  Start Date End Date Taking? Authorizing Provider  furosemide (LASIX) 40 MG tablet Take 1 tablet by mouth twice daily 10/07/22  Yes Branch, Dorothe Pea, MD  apixaban (ELIQUIS) 5 MG TABS tablet Take 1 tablet (5 mg total) by mouth 2 (two) times daily. 09/17/20   Catarina Hartshorn, MD  carvedilol (COREG) 6.25 MG tablet Take 1 tablet (6.25 mg total) by mouth 2 (two) times daily with a meal. 09/30/22   Antoine Poche, MD  ENTRESTO 24-26 MG Take 1 tablet by mouth twice daily 11/16/22   Antoine Poche, MD  naloxone [facility]) nasal spray 4 mg/0.1 mL  10/12/21   [provider]  oxyCODONE-acetaminophen (PERCOCET) 10-325 MG tablet Take 1 tablet by mouth 3 (three) times daily as needed. 11/13/21   [provider]  pantoprazole (PROTONIX) 40 MG tablet TAKE 1 TABLET BY MOUTH ONCE DAILY BEFORE BREAKFAST 05/18/22   Tiffany Kocher, PA-C  spironolactone (ALDACTONE) 25 MG tablet TAKE 1 TABLET BY MOUTH ONCE DAILY . APPOINTMENT REQUIRED FOR FUTURE REFILLS 01/01/22   Antoine Poche, MD    Physical Exam: Vitals:   12/07/22 1210 12/07/22 1211 12/07/22 1214  BP: (!) 139/93    Pulse: 75    Resp: 19    Temp: 98.2 F (36.8 C)    TempSrc: Oral    SpO2: (!) 79%  94%  Weight:  115.7 kg   Height:  6' (1.829 m)    Constitutional: Early male currently in no acute distress. Eyes: PERRL, lids and conjunctivae normal ENMT: Mucous membranes are moist.  Fair dentition. Neck: normal, supple, JVD present to the angle of the jaw Respiratory: Normal respiratory effort with crackles appreciated in the mid to lower lung fields. Cardiovascular: Regular rate and rhythm, positive systolic murmur present 2/6.  At least 2+ pitting bilateral lower extremity edema present.  Abdomen: no tenderness, no masses palpated. No hepatosplenomegaly. Bowel sounds positive.  Musculoskeletal: no clubbing / cyanosis. No joint deformity upper and lower extremities. Good ROM, no contractures. Normal muscle tone.  Skin: no rashes, lesions,  ulcers. No induration Neurologic: CN 2-12 grossly intact.   Strength 5/5 in all 4.  Psychiatric: Normal judgment and insight. Alert and oriented x 3. Normal mood.   Data Reviewed:  EKG reveals sinus rhythm at 73 bpm with incomplete RBBB and right axis deviation..  Reviewed labs, imaging, and pertinent records as documented.  Assessment and Plan:  Acute respiratory failure with hypoxia secondary to acute on chronic heart failure with midrange ejection fraction Patient presented with complaints of bilateral lower extremity edema for the last 3 to 4 days, increasing shortness of breath, and weight gain.  In the emergency department patient was noted to be hypoxic down to 79% on room air.  Patient was placed on 6 L of nasal cannula oxygen with O2 saturations greater than 92%.  He had been given Lasix 60 mg IV. -Admit to a progressive bed -Heart failure order set utilized -Strict I&Os and daily weights -Continuous pulse oximetry with oxygen maintain O2 saturations greater than 92% -Follow-up BNP -Lasix 40 mg IV twice daily -Continue Coreg and spironolactone -PT to eval and  treat due to mobility issues -Consider formally consulting cardiology in a.m. if heart function noted to be decreased.  Acute kidney injury   On admission creatinine elevated up to 1.65 with BUN 29.  Baseline creatinine previously noted to be around 1-1.1.  Suspect possibly secondary to hypoperfusion in the setting of CHF. -Check urinalysis -Hold Entresto due to worsening kidney function.  Consider resuming in a.m. if kidney function improving with diuresis -Continue to monitor kidney function with diuresis.  Essential hypertension On admission blood pressures noted to be 139/93. -Resume Entresto in a.m. as long as kidney function improving. -Continue Coreg and spironolactone,  Thrombocytopenia Acute.  Platelet count noted to be 113.  No reports of bleeding. -Continue to monitor  History of pulmonary  embolism Paroxysmal atrial fibrillation on chronic anticoagulation Patient appears to be in sinus rhythm at this time.  Patient with prior history of segmental PE to the right posterior lower lobe back in 08/2009. -Continue Eliquis  Obesity BMI 34.59 kg/m.  DVT prophylaxis: Eliquis Advance Care Planning:   Code Status: Full Code   Consults: None  Family Communication: Patient's sister and niece updated at bedside  Severity of Illness: The appropriate patient status for this patient is INPATIENT. Inpatient status is judged to be reasonable and necessary in order to provide the required intensity of service to ensure the patient's safety. The patient's presenting symptoms, physical exam findings, and initial radiographic and laboratory data in the context of their chronic comorbidities is felt to place them at high risk for further clinical deterioration. Furthermore, it is not anticipated that the patient will be medically stable for discharge from the hospital within 2 midnights of admission.   * I certify that at the point of admission it is my clinical judgment that the patient will require inpatient hospital care spanning beyond 2 midnights from the point of admission due to high intensity of service, high risk for further deterioration and high frequency of surveillance required.*  Author: Clydie Braun, MD 12/07/2022 2:01 PM  For on call review [email or web address].

## 2022-12-07 NOTE — Progress Notes (Signed)
Patient brought to 4E from ED. VSS. Telemetry box applied, CCMD notified. Patient oriented to room and staff. Call bell in reach.  Laguana Desautel L Collis Thede, RN  

## 2022-12-08 ENCOUNTER — Inpatient Hospital Stay (HOSPITAL_COMMUNITY): Payer: 59

## 2022-12-08 ENCOUNTER — Ambulatory Visit: Payer: 59 | Admitting: Gastroenterology

## 2022-12-08 DIAGNOSIS — J9622 Acute and chronic respiratory failure with hypercapnia: Secondary | ICD-10-CM

## 2022-12-08 DIAGNOSIS — I5022 Chronic systolic (congestive) heart failure: Secondary | ICD-10-CM

## 2022-12-08 DIAGNOSIS — I50810 Right heart failure, unspecified: Secondary | ICD-10-CM

## 2022-12-08 DIAGNOSIS — I4891 Unspecified atrial fibrillation: Secondary | ICD-10-CM | POA: Diagnosis not present

## 2022-12-08 DIAGNOSIS — I5033 Acute on chronic diastolic (congestive) heart failure: Secondary | ICD-10-CM

## 2022-12-08 DIAGNOSIS — J9621 Acute and chronic respiratory failure with hypoxia: Secondary | ICD-10-CM

## 2022-12-08 DIAGNOSIS — E875 Hyperkalemia: Secondary | ICD-10-CM | POA: Diagnosis not present

## 2022-12-08 DIAGNOSIS — N179 Acute kidney failure, unspecified: Secondary | ICD-10-CM | POA: Diagnosis not present

## 2022-12-08 LAB — ECHOCARDIOGRAM COMPLETE
Area-P 1/2: 4.21 cm2
Calc EF: 51.3 %
Height: 72 in
S' Lateral: 3.3 cm
Single Plane A2C EF: 49.8 %
Single Plane A4C EF: 55.9 %
Weight: 4426.84 [oz_av]

## 2022-12-08 LAB — BASIC METABOLIC PANEL
Anion gap: 14 (ref 5–15)
BUN: 25 mg/dL — ABNORMAL HIGH (ref 8–23)
CO2: 37 mmol/L — ABNORMAL HIGH (ref 22–32)
Calcium: 9.4 mg/dL (ref 8.9–10.3)
Chloride: 95 mmol/L — ABNORMAL LOW (ref 98–111)
Creatinine, Ser: 1.37 mg/dL — ABNORMAL HIGH (ref 0.61–1.24)
GFR, Estimated: 56 mL/min — ABNORMAL LOW (ref >60)
Glucose, Bld: 82 mg/dL (ref 70–99)
Potassium: 5.4 mmol/L — ABNORMAL HIGH (ref 3.5–5.1)
Sodium: 146 mmol/L — ABNORMAL HIGH (ref 135–145)

## 2022-12-08 LAB — CBC
HCT: 49.4 % (ref 39.0–52.0)
Hemoglobin: 14.9 g/dL (ref 13.0–17.0)
MCH: 30.3 pg (ref 26.0–34.0)
MCHC: 30.2 g/dL (ref 30.0–36.0)
MCV: 100.4 fL — ABNORMAL HIGH (ref 80.0–100.0)
Platelets: 99 10*3/uL — ABNORMAL LOW (ref 150–400)
RBC: 4.92 MIL/uL (ref 4.22–5.81)
RDW: 14.3 % (ref 11.5–15.5)
WBC: 4.3 10*3/uL (ref 4.0–10.5)
nRBC: 0 % (ref 0.0–0.2)

## 2022-12-08 MED ORDER — FOLIC ACID 1 MG PO TABS
1.0000 mg | ORAL_TABLET | Freq: Every day | ORAL | Status: DC
Start: 2022-12-08 — End: 2022-12-11
  Administered 2022-12-08 – 2022-12-11 (×4): 1 mg via ORAL
  Filled 2022-12-08 (×4): qty 1

## 2022-12-08 MED ORDER — LORAZEPAM 1 MG PO TABS: 1.0000 mg | ORAL_TABLET | ORAL | Status: AC | PRN

## 2022-12-08 MED ORDER — IPRATROPIUM-ALBUTEROL 0.5-2.5 (3) MG/3ML IN SOLN
3.0000 mL | Freq: Two times a day (BID) | RESPIRATORY_TRACT | Status: DC
Start: 2022-12-08 — End: 2022-12-11
  Administered 2022-12-09 – 2022-12-11 (×4): 3 mL via RESPIRATORY_TRACT
  Filled 2022-12-08 (×6): qty 3

## 2022-12-08 MED ORDER — IPRATROPIUM-ALBUTEROL 0.5-2.5 (3) MG/3ML IN SOLN
3.0000 mL | Freq: Four times a day (QID) | RESPIRATORY_TRACT | Status: DC
  Administered 2022-12-08: 3 mL via RESPIRATORY_TRACT
  Filled 2022-12-08: qty 3

## 2022-12-08 MED ORDER — THIAMINE MONONITRATE 100 MG PO TABS
100.0000 mg | ORAL_TABLET | Freq: Every day | ORAL | Status: DC
  Administered 2022-12-08 – 2022-12-11 (×4): 100 mg via ORAL
  Filled 2022-12-08 (×4): qty 1

## 2022-12-08 MED ORDER — LORAZEPAM 0.5 MG PO TABS: 0.5000 mg | ORAL_TABLET | Freq: Four times a day (QID) | ORAL | Status: DC | PRN

## 2022-12-08 MED ORDER — THIAMINE HCL 100 MG/ML IJ SOLN
100.0000 mg | Freq: Every day | INTRAMUSCULAR | Status: DC
  Filled 2022-12-08: qty 2

## 2022-12-08 MED ORDER — SODIUM ZIRCONIUM CYCLOSILICATE 5 G PO PACK
5.0000 g | PACK | Freq: Once | ORAL | Status: AC
  Administered 2022-12-08: 5 g via ORAL
  Filled 2022-12-08: qty 1

## 2022-12-08 MED ORDER — FUROSEMIDE 10 MG/ML IJ SOLN
40.0000 mg | Freq: Two times a day (BID) | INTRAMUSCULAR | Status: DC
  Administered 2022-12-08 – 2022-12-09 (×4): 40 mg via INTRAVENOUS
  Filled 2022-12-08 (×4): qty 4

## 2022-12-08 MED ORDER — ALBUTEROL SULFATE (2.5 MG/3ML) 0.083% IN NEBU: 2.5000 mg | INHALATION_SOLUTION | RESPIRATORY_TRACT | Status: DC | PRN

## 2022-12-08 MED ORDER — ADULT MULTIVITAMIN W/MINERALS CH
1.0000 | ORAL_TABLET | Freq: Every day | ORAL | Status: DC
  Administered 2022-12-08 – 2022-12-11 (×4): 1 via ORAL
  Filled 2022-12-08 (×4): qty 1

## 2022-12-08 NOTE — Evaluation (Signed)
Physical Therapy Evaluation Patient Details Name: Dennis Swanson MRN: [id number] DOB: 1955/03/09 Today's Date: 12/08/2022  History of Present Illness  Patient is a 67 y/o male who presented on 12/07/22 due to LE swelling and SOB, with 30# weight gain.  Admitted for CHF exacerbation.  PMH positive for PE, ETOH use, a-flutter, CHF, HTN, LE cellulitis and PAF.  Clinical Impression  Patient presents with decreased mobility due to generalized weakness and decreased cardiorespiratory endurance.  Previously independent though admits to limited activity tolerance with swelling and SOB.  Currently needing S with increased clumsiness with ambulation in hallway with footwear and due to fatigue.  Feel he will benefit from skilled PT in the acute setting and possibly from HHPT at d/c as he lives alone.          If plan is discharge home, recommend the following: Assistance with cooking/housework;A little help with walking and/or transfers;Help with stairs or ramp for entrance   Can travel by private vehicle        Equipment Recommendations Other (comment) (possibly cane)  Recommendations for Other Services       Functional Status Assessment Patient has had a recent decline in their functional status and demonstrates the ability to make significant improvements in function in a reasonable and predictable amount of time.     Precautions / Restrictions Precautions Precautions: Fall Precaution Comments: denies history of falls      Mobility  Bed Mobility Overal bed mobility: Modified Independent                  Transfers Overall transfer level: Needs assistance Equipment used: None Transfers: Sit to/from Stand Sit to Stand: Supervision           General transfer comment: assist due to shoes not fully donned and for safety    Ambulation/Gait Ambulation/Gait assistance: Supervision Gait Distance (Feet): 300 Feet Assistive device: None Gait Pattern/deviations:  Step-through pattern, Decreased stride length, Wide base of support, Shuffle       General Gait Details: shoes too big wearing with swelling down and no socks some clumsiness initially due to shoes and on return to room increased with some fatigue, though pt denies SOB  Stairs            Wheelchair Mobility     Tilt Bed    Modified Rankin (Stroke Patients Only)       Balance Overall balance assessment: Needs assistance   Sitting balance-Leahy Scale: Normal       Standing balance-Leahy Scale: Good Standing balance comment: attempting to fix shoes in standig reaching while holding door handle, cues for safety and A to pull heels up on shoes                             Pertinent Vitals/Pain Pain Assessment Pain Assessment: No/denies pain    Home Living Family/patient expects to be discharged to:: Private residence Living Arrangements: Alone (sister and neice can help) Available Help at Discharge: Family;Available PRN/intermittently Type of Home: Apartment Home Access: Level entry       Home Layout: One level Home Equipment: None      Prior Function Prior Level of Function : Independent/Modified Independent               ADLs Comments: drives to get groceries, cooks, etc     Extremity/Trunk Assessment   Upper Extremity Assessment Upper Extremity Assessment: Overall WFL for tasks assessed  Lower Extremity Assessment Lower Extremity Assessment: Generalized weakness       Communication   Communication Communication: No apparent difficulties  Cognition Arousal: Alert Behavior During Therapy: WFL for tasks assessed/performed Overall Cognitive Status: Within Functional Limits for tasks assessed                                          General Comments General comments (skin integrity, edema, etc.): SpO2 on RA at rest 71%, applied O2 at 3-4LPM up to 90's and on decreasing O2 with ambulation with SpO2 98% down to 2L  with ambulation, replaced on 2L at rest. RN aware    Exercises     Assessment/Plan    PT Assessment Patient needs continued PT services  PT Problem List Decreased balance;Decreased activity tolerance;Decreased safety awareness;Cardiopulmonary status limiting activity;Decreased strength       PT Treatment Interventions DME instruction;Functional mobility training;Balance training;Patient/family education;Gait training;Therapeutic exercise;Therapeutic activities    PT Goals (Current goals can be found in the Care Plan section)  Acute Rehab PT Goals Patient Stated Goal: to return to independent PT Goal Formulation: With patient Time For Goal Achievement: 12/22/22 Potential to Achieve Goals: Good    Frequency Min 1X/week     Co-evaluation               AM-PAC PT "6 Clicks" Mobility  Outcome Measure Help needed turning from your back to your side while in a flat bed without using bedrails?: None Help needed moving from lying on your back to sitting on the side of a flat bed without using bedrails?: None Help needed moving to and from a bed to a chair (including a wheelchair)?: A Little Help needed standing up from a chair using your arms (e.g., wheelchair or bedside chair)?: A Little Help needed to walk in hospital room?: A Little Help needed climbing 3-5 steps with a railing? : Total 6 Click Score: 18    End of Session Equipment Utilized During Treatment: Gait belt;Oxygen Activity Tolerance: Patient limited by fatigue Patient left: in chair;with call bell/phone within reach   PT Visit Diagnosis: Other abnormalities of gait and mobility (R26.89);Muscle weakness (generalized) (M62.81)    Time: 1235-1300 PT Time Calculation (min) (ACUTE ONLY): 25 min   Charges:   PT Evaluation $PT Eval Moderate Complexity: 1 Mod PT Treatments $Gait Training: 8-22 mins PT General Charges $$ ACUTE PT VISIT: 1 Visit         Sheran Lawless, PT Acute Rehabilitation  Services Office:[phone] 12/08/2022   Elray Mcgregor 12/08/2022, 5:30 PM

## 2022-12-08 NOTE — Progress Notes (Addendum)
PROGRESS NOTE   Dennis Swanson  WVP:[id number]    DOB: 08-Aug-1955    DOA: 12/07/2022  PCP: Kirstie Peri, MD   I have briefly reviewed patients previous medical records in [facility].  Chief Complaint  Patient presents with   Leg Swelling    Brief Hospital Course:  67 year old male, lives alone, independent, medical history significant for chronic systolic CHF, A-fib, pulmonary embolism on Eliquis, HTN, HLD, obesity, chronic iron deficiency anemia, alcohol use disorder, who presented to the ED on 12/07/2022 with complaints of progressive dyspnea on exertion with minimal activity, progressive lower extremity edema, weight gain possibly up to 30 pounds, abdominal distention but no chest pain.  Reports dietary indiscretion and eating salty food and consuming alcohol.  In the ED, oxygen saturations in the 79% on room air, required 6 L/min [place] oxygen to bring up saturations to 92%.  Overnight of admission, required BiPAP for acute on chronic hypoxic and hypercapnic respiratory failure.  Admitted for acute on chronic diastolic CHF, acute on chronic hypoxic and hypercapnic respiratory failure.   Assessment & Plan:  Principal Problem:   Acute respiratory failure with hypoxia (HCC) Active Problems:   Heart failure with mid-range ejection fraction (HFmEF) (HCC)   AKI (acute kidney injury) (HCC)   Essential hypertension   Long term current use of anticoagulant   Paroxysmal atrial fibrillation (HCC)   History of pulmonary embolism   Obesity (BMI 30-39.9)   Acute on suspected chronic hypoxic and hypercapnic respiratory failure Multifactorial due to decompensated CHF complicating underlying suspected OSA/OHS, ?? COPD/asthma. VBG on admission: pH 7.26, pCO2 111, pO2 <31. Presented with oxygen saturation of 79% on room air on admission. Required BiPAP overnight of admission, now back on 1 L/min [place] oxygen. Treat the underlying causes, oxygen support to maintain saturations between 88-92%  and consider CPAP nightly.  BiPAP as needed. Will need outpatient pulmonology consultation for formal PFTs and sleep studies.  Reports that he had an upcoming appointment for sleep study. Has seen pulmonology in July 2024, assessed to have biventricular heart failure, remote PE, negative collagen studies, non-smoker, planned for home sleep testing  Acute on chronic diastolic CHF TTE 54/62/7035: LVEF 50-55%, LV diastolic dysfunction could not be evaluated.  No aortic stenosis.  Recovered EF compared to prior. BNP 534 HS Troponin 31 > 30, minimally elevated, flat trend, no chest pain, suspect demand ischemia. Chest x-ray: No active disease. S/p Lasix 60 mg IV x 1, -900 mL thus far.  Feels better Continue IV Lasix 40 mg twice daily, strict intake output and daily weights. Cardiology consultation  A-fib with controlled ventricular rate: Continue carvedilol and apixaban  Acute kidney injury complicating stage II CKD Last known creatinine 1.17 in May 23.   Presented with creatinine of 1.65,?  Cardiorenal syndrome Post diuresis, creatinine better at 1.37 Continue IV Lasix with close BMP monitoring. Avoid other nephrotoxic's.  Holding Aldactone due to hyperkalemia  Hypernatremia/hyperkalemia, mild No comment about hemolysis. Holding Aldactone today Lokelma 5 g x 1 dose IV Lasix should help  Essential hypertension Controlled on carvedilol alone for now. Holding Aldactone and Entresto due to AKI and hyperkalemia  History of pulmonary embolism Continue Eliquis Left lower extremity asymmetrically swollen compared to right, may be all edema but rule out DVT by venous Doppler. Claims compliance to Eliquis.  Thrombocytopenia Follow CBCs  Alcohol use disorder Does not appear to be forthcoming about the exact amount of alcohol he consumes. CIWA protocol.  Suspected sleep apnea Outpatient follow-up with pulmonology  for sleep study. CPAP at bedtime  Chronic pain Continue prior home  dose of opioids.  Avoid escalation.  Alcohol use,?  Disorder CIWA protocol.  Body mass index is 37.52 kg/m./Obesity Lifestyle modifications and weight loss as outpatient.   DVT prophylaxis:   On apixaban anticoagulation   Code Status: Full Code:  Family Communication: None at bedside Disposition:  Status is: Inpatient Remains inpatient appropriate because: IV Lasix     Consultants:   Cardiology  Procedures:   BiPAP  Antimicrobials:      Subjective:  History as noted above.  Reports feeling better since admission.  Dyspnea improved but not completely resolved.  Still with dyspnea on minimal exertion such as bending.  Left leg more swollen than the right.  No leg pain.  No chest pain.  Volunteers to diet with increased salt with a friend.  Alcohol use.  2-3 beers per day up to 3 times per week.  Objective:   Vitals:   12/07/22 2317 12/08/22 0348 12/08/22 0453 12/08/22 0754  BP: (!) 126/99 103/80  115/82  Pulse: 73 75  91  Resp: 20 20  17   Temp: 97.6 F (36.4 C) (!) 97.5 F (36.4 C)  98.2 F (36.8 C)  TempSrc: Axillary Axillary  Oral  SpO2: 100% 100%  90%  Weight:   125.5 kg   Height:        General exam: Middle-age male, moderately built and obese lying propped up in bed with some dyspnea even when speaking full sentences. Respiratory system: Clear to auscultation anteriorly.  Diminished breath sounds posteriorly with few expiratory rhonchi and basal crackles. Cardiovascular system: S1 & S2 heard, RRR.  JVD difficult to assess given body habitus.  No murmurs.  2+ pitting bilateral leg edema, left > right. Gastrointestinal system: Abdomen is nondistended/but obese, soft and nontender. No organomegaly or masses felt. Normal bowel sounds heard. Central nervous system: Alert and oriented. No focal neurological deficits. Extremities: Symmetric 5 x 5 power. Skin: No rashes, lesions or ulcers Psychiatry: Judgement and insight appear normal. Mood & affect appropriate.      Data Reviewed:   I have personally reviewed following labs and imaging studies   CBC: Recent Labs  Lab 12/07/22 1215 12/07/22 1258  WBC 4.7  --   HGB 14.5 16.7  HCT 48.1 49.0  MCV 99.4  --   PLT 117*  --     Basic Metabolic Panel: Recent Labs  Lab 12/07/22 1242 12/07/22 1258 12/08/22 0503  NA 143 144 146*  K 4.3 4.3 5.4*  CL 95* 96* 95*  CO2 40*  --  37*  GLUCOSE 99 93 82  BUN 29* 34* 25*  CREATININE 1.65* 1.60* 1.37*  CALCIUM 8.9  --  9.4  MG 1.8  --   --     Liver Function Tests: Recent Labs  Lab 12/07/22 1242  AST 20  ALT 20  ALKPHOS 45  BILITOT 0.8  PROT 6.3*  ALBUMIN 3.4*    CBG: No results for input(s): "GLUCAP" in the last 168 hours.  Microbiology Studies:   Recent Results (from the past 240 hour(s))  Resp panel by RT-PCR (RSV, Flu A&B, Covid) Anterior Nasal Swab     Status: None   Collection Time: 12/07/22 12:52 PM   Specimen: Anterior Nasal Swab  Result Value Ref Range Status   SARS Coronavirus 2 by RT PCR NEGATIVE NEGATIVE Final   Influenza A by PCR NEGATIVE NEGATIVE Final   Influenza B by PCR NEGATIVE NEGATIVE  Final    Comment: (NOTE) The Xpert Xpress SARS-CoV-2/FLU/RSV plus assay is intended as an aid in the diagnosis of influenza from Nasopharyngeal swab specimens and should not be used as a sole basis for treatment. Nasal washings and aspirates are unacceptable for Xpert Xpress SARS-CoV-2/FLU/RSV testing.  Fact Sheet for Patients: BloggerCourse.com  Fact Sheet for Healthcare Providers: SeriousBroker.it  This test is not yet approved or cleared by the [place] FDA and has been authorized for detection and/or diagnosis of SARS-CoV-2 by FDA under an Emergency Use Authorization (EUA). This EUA will remain in effect (meaning this test can be used) for the duration of the COVID-19 declaration under Section 564(b)(1) of the Act, 21 U.S.C. section 360bbb-3(b)(1), unless the  authorization is terminated or revoked.     Resp Syncytial Virus by PCR NEGATIVE NEGATIVE Final    Comment: (NOTE) Fact Sheet for Patients: BloggerCourse.com  Fact Sheet for Healthcare Providers: SeriousBroker.it  This test is not yet approved or cleared by the [place] FDA and has been authorized for detection and/or diagnosis of SARS-CoV-2 by FDA under an Emergency Use Authorization (EUA). This EUA will remain in effect (meaning this test can be used) for the duration of the COVID-19 declaration under Section 564(b)(1) of the Act, 21 U.S.C. section 360bbb-3(b)(1), unless the authorization is terminated or revoked.  Performed at [facility], [address] [place], [place] [address]     Radiology Studies:  DG Chest Port 1 View  Result Date: 12/07/2022 CLINICAL DATA:  Shortness of breath. EXAM: PORTABLE CHEST 1 VIEW COMPARISON:  July 31, 2022. FINDINGS: Stable cardiomegaly.  Lungs are clear.  Bony thorax is unremarkable. IMPRESSION: No active disease. Electronically Signed   By: Lupita Raider M.D.   On: 12/07/2022 17:16    Scheduled Meds:    apixaban  5 mg Oral BID   carvedilol  6.25 mg Oral BID WC   pantoprazole  40 mg Oral QAC breakfast   sodium chloride flush  3 mL Intravenous Q12H   sodium zirconium cyclosilicate  5 g Oral Once    Continuous Infusions:     LOS: 1 day     Marcellus Scott, MD,  FACP, [facility], [facility], [facility]   [facility] [place]      To contact the attending provider between 7A-7P or the covering provider during after hours 7P-7A, please log into the web site [email or web address] and access using universal [place] password for that web site. If you do not have the password, please call the hospital operator.  12/08/2022, 8:49 AM

## 2022-12-08 NOTE — Progress Notes (Signed)
  Echocardiogram 2D Echocardiogram has been performed.  Janalyn Harder 12/08/2022, 10:56 AM

## 2022-12-08 NOTE — Consult Note (Addendum)
Cardiology Consultation   Patient ID: Dennis Swanson MRN: [id number]; DOB: September 07, 1955  Admit date: 12/07/2022 Date of Consult: 12/08/2022  PCP:  Kirstie Peri, MD   [facility] Providers Cardiologist:  Dina Rich, MD   {  Patient Profile:   Dennis Swanson is a 67 y.o. male with a hx of paroxysmal atrial fibrillation, chronic heart failure with improved ejection fraction, pulmonary hypertension, PE 2011, hypertension, hyperlipidemia, history of alcohol abuse who is being seen 12/08/2022 for the evaluation of CHF at the request of Dr. Waymon Amato.  History of Present Illness:   Dennis Swanson has had previous history of mildly reduced EF 40-45% in 2011, at 60-65% in 04/2015 and at 45-50% by echo in 08/2020, 50 to 55% in 12/2021.  Previously has had persistently severely reduced RV function since 2022 with plans for a sleep study now for many years however this still has not been completed.  Patient states that he had plans for sleep study to be completed this Friday.  Also does not appear that he has ever had a right heart catheterization.  Also has history of paroxysmal atrial fibrillation with no previous cardioversions.  Currently patient is being evaluated for a CHF exacerbation and worsening symptoms of orthopnea, shortness of breath, peripheral edema for the past 2 days.  From my interview he reports eating more salty foods and thinks that eating a country hand may have contributed to this.  He also states that his Lasix has been intermittently working.  Otherwise he reports compliancy with all of his medications.  He reports his baseline weight being around 242 pounds but his weight on admission here was 276 which would suggest a significant weight gain.  On arrival patient was significantly short of breath and had initially required BiPAP, but this has been weaned off and now currently maintaining room air.  He was given 1 dose of IV Lasix 60 mg initially and then  now is on IV Lasix 40 mg twice daily and states that he does have some improvement in his shortness of breath.  Labs are significant for sodium 146, potassium 5.4, creatinine 1.37 which is improving after diuresis.  BNP 534.  Troponins 31-30.  Today he has had low to soft blood pressures, narrow pulse pressures, and during my exam he is sweaty.  He has a history of alcohol abuse but states that he maybe drinks once or twice a week and generally only socially.  At times this is a 2-3 beers.   Past Medical History:  Diagnosis Date   Acute pulmonary embolism (HCC) 02/16/2009   Alcohol abuse    Atrial flutter (HCC)    CHF (congestive heart failure) (HCC)    a. EF 40-45% in 2011 b. EF at 60-65% in 04/2015 c. EF at 45-50% by echo in 09/2020   Dysrhythmia    Hypertension    Lower extremity cellulitis    bilateral   Systolic dysfunction     Past Surgical History:  Procedure Laterality Date   COLONOSCOPY N/A 05/17/2012   Procedure: COLONOSCOPY;  Surgeon: West Bali, MD;  Location: [facility];  Service: Endoscopy;  Laterality: N/A;  10:30   COLONOSCOPY WITH PROPOFOL N/A 09/15/2020   Surgeon: Earnest Bailey K, DO;  2 cm rubbery textured nodular anal mass, internal hemorrhoids, external hemorrhoids, sigmoid and descending diverticula, redundant colon.  Pathology revealed polypoid rectal mucosa with acute and chronic inflammation with erosion, granular hyperplasia and macular isolation of lamina propria, consistent with mucosal  Cardiology Consultation   Patient ID: Dennis Swanson MRN: [id number]; DOB: September 07, 1955  Admit date: 12/07/2022 Date of Consult: 12/08/2022  PCP:  Kirstie Peri, MD   [facility] Providers Cardiologist:  Dina Rich, MD   {  Patient Profile:   Dennis Swanson is a 67 y.o. male with a hx of paroxysmal atrial fibrillation, chronic heart failure with improved ejection fraction, pulmonary hypertension, PE 2011, hypertension, hyperlipidemia, history of alcohol abuse who is being seen 12/08/2022 for the evaluation of CHF at the request of Dr. Waymon Amato.  History of Present Illness:   Dennis Swanson has had previous history of mildly reduced EF 40-45% in 2011, at 60-65% in 04/2015 and at 45-50% by echo in 08/2020, 50 to 55% in 12/2021.  Previously has had persistently severely reduced RV function since 2022 with plans for a sleep study now for many years however this still has not been completed.  Patient states that he had plans for sleep study to be completed this Friday.  Also does not appear that he has ever had a right heart catheterization.  Also has history of paroxysmal atrial fibrillation with no previous cardioversions.  Currently patient is being evaluated for a CHF exacerbation and worsening symptoms of orthopnea, shortness of breath, peripheral edema for the past 2 days.  From my interview he reports eating more salty foods and thinks that eating a country hand may have contributed to this.  He also states that his Lasix has been intermittently working.  Otherwise he reports compliancy with all of his medications.  He reports his baseline weight being around 242 pounds but his weight on admission here was 276 which would suggest a significant weight gain.  On arrival patient was significantly short of breath and had initially required BiPAP, but this has been weaned off and now currently maintaining room air.  He was given 1 dose of IV Lasix 60 mg initially and then  now is on IV Lasix 40 mg twice daily and states that he does have some improvement in his shortness of breath.  Labs are significant for sodium 146, potassium 5.4, creatinine 1.37 which is improving after diuresis.  BNP 534.  Troponins 31-30.  Today he has had low to soft blood pressures, narrow pulse pressures, and during my exam he is sweaty.  He has a history of alcohol abuse but states that he maybe drinks once or twice a week and generally only socially.  At times this is a 2-3 beers.   Past Medical History:  Diagnosis Date   Acute pulmonary embolism (HCC) 02/16/2009   Alcohol abuse    Atrial flutter (HCC)    CHF (congestive heart failure) (HCC)    a. EF 40-45% in 2011 b. EF at 60-65% in 04/2015 c. EF at 45-50% by echo in 09/2020   Dysrhythmia    Hypertension    Lower extremity cellulitis    bilateral   Systolic dysfunction     Past Surgical History:  Procedure Laterality Date   COLONOSCOPY N/A 05/17/2012   Procedure: COLONOSCOPY;  Surgeon: West Bali, MD;  Location: [facility];  Service: Endoscopy;  Laterality: N/A;  10:30   COLONOSCOPY WITH PROPOFOL N/A 09/15/2020   Surgeon: Earnest Bailey K, DO;  2 cm rubbery textured nodular anal mass, internal hemorrhoids, external hemorrhoids, sigmoid and descending diverticula, redundant colon.  Pathology revealed polypoid rectal mucosa with acute and chronic inflammation with erosion, granular hyperplasia and macular isolation of lamina propria, consistent with mucosal  repeat CT chest to measure aorta.    Laboratory Data:  High Sensitivity Troponin:   Recent Labs  Lab 12/07/22 1242 12/07/22 1836  TROPONINIHS 31* 30*     Chemistry Recent Labs  Lab 12/07/22 1242 12/07/22 1258 12/08/22 0503  NA 143 144 146*  K 4.3 4.3 5.4*  CL 95* 96* 95*  CO2 40*  --  37*  GLUCOSE 99 93 82  BUN 29* 34* 25*  CREATININE 1.65* 1.60* 1.37*  CALCIUM 8.9  --  9.4  MG 1.8  --   --   GFRNONAA 45*  --  56*  ANIONGAP 8  --  14    Recent Labs  Lab 12/07/22 1242  PROT 6.3*  ALBUMIN 3.4*  AST 20  ALT 20  ALKPHOS 45  BILITOT 0.8   Lipids No results for input(s): "CHOL", "TRIG", "HDL", "LABVLDL", "LDLCALC", "CHOLHDL" in the last 168 hours.  Hematology Recent Labs  Lab 12/07/22 1215 12/07/22 1258 12/08/22 0914  WBC 4.7  --  4.3  RBC 4.84  --  4.92  HGB 14.5 16.7 14.9  HCT 48.1 49.0 49.4  MCV 99.4  --  100.4*  MCH 30.0  --  30.3  MCHC 30.1  --  30.2  RDW 14.2  --  14.3  PLT 117*  --  99*   Thyroid No results for input(s): "TSH", "FREET4" in the last 168 hours.  BNP Recent Labs  Lab 12/07/22 1836  BNP 533.6*    DDimer No results for input(s): "DDIMER" in the last 168 hours.   Radiology/Studies:  ECHOCARDIOGRAM COMPLETE  Result Date: 12/08/2022    ECHOCARDIOGRAM REPORT   Patient Name:   Dennis Swanson Date of Exam: 12/08/2022 Medical Rec #:  [id number]           Height:       72.0 in Accession #:    [id number]          Weight:       276.7 lb Date of Birth:  Jan 16, 1956            BSA:          2.446 m Patient Age:    67 years            BP:           115/82 mmHg Patient Gender: M                   HR:           80 bpm. Exam Location:  Inpatient Procedure: 2D Echo, 3D Echo, Cardiac Doppler and Color Doppler Indications:    I50.40* Unspecified combined systolic (congestive)  and diastolic                 (congestive) heart failure  History:        Patient has prior history of Echocardiogram examinations, most                 recent 01/06/2022. CHF and Cardiomyopathy, Abnormal ECG,                 Arrythmias:Atrial Fibrillation, Signs/Symptoms:Chest Pain; Risk                 Factors:Hypertension. ETOH. Pulmonary embolus. Right ventricular                 failure.  Sonographer:    Sheralyn Boatman RDCS Referring Phys: [id number] RONDELL A SMITH IMPRESSIONS  1. Left ventricular ejection  Cardiology Consultation   Patient ID: Dennis Swanson MRN: [id number]; DOB: September 07, 1955  Admit date: 12/07/2022 Date of Consult: 12/08/2022  PCP:  Kirstie Peri, MD   [facility] Providers Cardiologist:  Dina Rich, MD   {  Patient Profile:   Dennis Swanson is a 67 y.o. male with a hx of paroxysmal atrial fibrillation, chronic heart failure with improved ejection fraction, pulmonary hypertension, PE 2011, hypertension, hyperlipidemia, history of alcohol abuse who is being seen 12/08/2022 for the evaluation of CHF at the request of Dr. Waymon Amato.  History of Present Illness:   Dennis Swanson has had previous history of mildly reduced EF 40-45% in 2011, at 60-65% in 04/2015 and at 45-50% by echo in 08/2020, 50 to 55% in 12/2021.  Previously has had persistently severely reduced RV function since 2022 with plans for a sleep study now for many years however this still has not been completed.  Patient states that he had plans for sleep study to be completed this Friday.  Also does not appear that he has ever had a right heart catheterization.  Also has history of paroxysmal atrial fibrillation with no previous cardioversions.  Currently patient is being evaluated for a CHF exacerbation and worsening symptoms of orthopnea, shortness of breath, peripheral edema for the past 2 days.  From my interview he reports eating more salty foods and thinks that eating a country hand may have contributed to this.  He also states that his Lasix has been intermittently working.  Otherwise he reports compliancy with all of his medications.  He reports his baseline weight being around 242 pounds but his weight on admission here was 276 which would suggest a significant weight gain.  On arrival patient was significantly short of breath and had initially required BiPAP, but this has been weaned off and now currently maintaining room air.  He was given 1 dose of IV Lasix 60 mg initially and then  now is on IV Lasix 40 mg twice daily and states that he does have some improvement in his shortness of breath.  Labs are significant for sodium 146, potassium 5.4, creatinine 1.37 which is improving after diuresis.  BNP 534.  Troponins 31-30.  Today he has had low to soft blood pressures, narrow pulse pressures, and during my exam he is sweaty.  He has a history of alcohol abuse but states that he maybe drinks once or twice a week and generally only socially.  At times this is a 2-3 beers.   Past Medical History:  Diagnosis Date   Acute pulmonary embolism (HCC) 02/16/2009   Alcohol abuse    Atrial flutter (HCC)    CHF (congestive heart failure) (HCC)    a. EF 40-45% in 2011 b. EF at 60-65% in 04/2015 c. EF at 45-50% by echo in 09/2020   Dysrhythmia    Hypertension    Lower extremity cellulitis    bilateral   Systolic dysfunction     Past Surgical History:  Procedure Laterality Date   COLONOSCOPY N/A 05/17/2012   Procedure: COLONOSCOPY;  Surgeon: West Bali, MD;  [facility]: [facility];  Service: Endoscopy;  Laterality: N/A;  10:30   COLONOSCOPY WITH PROPOFOL N/A 09/15/2020   Surgeon: Earnest Bailey K, DO;  2 cm rubbery textured nodular anal mass, internal hemorrhoids, external hemorrhoids, sigmoid and descending diverticula, redundant colon.  Pathology revealed polypoid rectal mucosa with acute and chronic inflammation with erosion, granular hyperplasia and macular isolation of lamina propria, consistent with mucosal  repeat CT chest to measure aorta.    Laboratory Data:  High Sensitivity Troponin:   Recent Labs  Lab 12/07/22 1242 12/07/22 1836  TROPONINIHS 31* 30*     Chemistry Recent Labs  Lab 12/07/22 1242 12/07/22 1258 12/08/22 0503  NA 143 144 146*  K 4.3 4.3 5.4*  CL 95* 96* 95*  CO2 40*  --  37*  GLUCOSE 99 93 82  BUN 29* 34* 25*  CREATININE 1.65* 1.60* 1.37*  CALCIUM 8.9  --  9.4  MG 1.8  --   --   GFRNONAA 45*  --  56*  ANIONGAP 8  --  14    Recent Labs  Lab 12/07/22 1242  PROT 6.3*  ALBUMIN 3.4*  AST 20  ALT 20  ALKPHOS 45  BILITOT 0.8   Lipids No results for input(s): "CHOL", "TRIG", "HDL", "LABVLDL", "LDLCALC", "CHOLHDL" in the last 168 hours.  Hematology Recent Labs  Lab 12/07/22 1215 12/07/22 1258 12/08/22 0914  WBC 4.7  --  4.3  RBC 4.84  --  4.92  HGB 14.5 16.7 14.9  HCT 48.1 49.0 49.4  MCV 99.4  --  100.4*  MCH 30.0  --  30.3  MCHC 30.1  --  30.2  RDW 14.2  --  14.3  PLT 117*  --  99*   Thyroid No results for input(s): "TSH", "FREET4" in the last 168 hours.  BNP Recent Labs  Lab 12/07/22 1836  BNP 533.6*    DDimer No results for input(s): "DDIMER" in the last 168 hours.   Radiology/Studies:  ECHOCARDIOGRAM COMPLETE  Result Date: 12/08/2022    ECHOCARDIOGRAM REPORT   Patient Name:   Dennis Swanson Date of Exam: 12/08/2022 Medical Rec #:  [id number]           Height:       72.0 in Accession #:    [id number]          Weight:       276.7 lb Date of Birth:  Jan 16, 1956            BSA:          2.446 m Patient Age:    67 years            BP:           115/82 mmHg Patient Gender: M                   HR:           80 bpm. Exam Location:  Inpatient Procedure: 2D Echo, 3D Echo, Cardiac Doppler and Color Doppler Indications:    I50.40* Unspecified combined systolic (congestive)  and diastolic                 (congestive) heart failure  History:        Patient has prior history of Echocardiogram examinations, most                 recent 01/06/2022. CHF and Cardiomyopathy, Abnormal ECG,                 Arrythmias:Atrial Fibrillation, Signs/Symptoms:Chest Pain; Risk                 Factors:Hypertension. ETOH. Pulmonary embolus. Right ventricular                 failure.  Sonographer:    Sheralyn Boatman RDCS Referring Phys: [id number] RONDELL A SMITH IMPRESSIONS  1. Left ventricular ejection  repeat CT chest to measure aorta.    Laboratory Data:  High Sensitivity Troponin:   Recent Labs  Lab 12/07/22 1242 12/07/22 1836  TROPONINIHS 31* 30*     Chemistry Recent Labs  Lab 12/07/22 1242 12/07/22 1258 12/08/22 0503  NA 143 144 146*  K 4.3 4.3 5.4*  CL 95* 96* 95*  CO2 40*  --  37*  GLUCOSE 99 93 82  BUN 29* 34* 25*  CREATININE 1.65* 1.60* 1.37*  CALCIUM 8.9  --  9.4  MG 1.8  --   --   GFRNONAA 45*  --  56*  ANIONGAP 8  --  14    Recent Labs  Lab 12/07/22 1242  PROT 6.3*  ALBUMIN 3.4*  AST 20  ALT 20  ALKPHOS 45  BILITOT 0.8   Lipids No results for input(s): "CHOL", "TRIG", "HDL", "LABVLDL", "LDLCALC", "CHOLHDL" in the last 168 hours.  Hematology Recent Labs  Lab 12/07/22 1215 12/07/22 1258 12/08/22 0914  WBC 4.7  --  4.3  RBC 4.84  --  4.92  HGB 14.5 16.7 14.9  HCT 48.1 49.0 49.4  MCV 99.4  --  100.4*  MCH 30.0  --  30.3  MCHC 30.1  --  30.2  RDW 14.2  --  14.3  PLT 117*  --  99*   Thyroid No results for input(s): "TSH", "FREET4" in the last 168 hours.  BNP Recent Labs  Lab 12/07/22 1836  BNP 533.6*    DDimer No results for input(s): "DDIMER" in the last 168 hours.   Radiology/Studies:  ECHOCARDIOGRAM COMPLETE  Result Date: 12/08/2022    ECHOCARDIOGRAM REPORT   Patient Name:   Dennis Swanson Date of Exam: 12/08/2022 Medical Rec #:  [id number]           Height:       72.0 in Accession #:    [id number]          Weight:       276.7 lb Date of Birth:  Jan 16, 1956            BSA:          2.446 m Patient Age:    67 years            BP:           115/82 mmHg Patient Gender: M                   HR:           80 bpm. Exam Location:  Inpatient Procedure: 2D Echo, 3D Echo, Cardiac Doppler and Color Doppler Indications:    I50.40* Unspecified combined systolic (congestive)  and diastolic                 (congestive) heart failure  History:        Patient has prior history of Echocardiogram examinations, most                 recent 01/06/2022. CHF and Cardiomyopathy, Abnormal ECG,                 Arrythmias:Atrial Fibrillation, Signs/Symptoms:Chest Pain; Risk                 Factors:Hypertension. ETOH. Pulmonary embolus. Right ventricular                 failure.  Sonographer:    Sheralyn Boatman RDCS Referring Phys: [id number] RONDELL A SMITH IMPRESSIONS  1. Left ventricular ejection  Cardiology Consultation   Patient ID: Dennis Swanson MRN: [id number]; DOB: September 07, 1955  Admit date: 12/07/2022 Date of Consult: 12/08/2022  PCP:  Kirstie Peri, MD   [facility] Providers Cardiologist:  Dina Rich, MD   {  Patient Profile:   Dennis Swanson is a 67 y.o. male with a hx of paroxysmal atrial fibrillation, chronic heart failure with improved ejection fraction, pulmonary hypertension, PE 2011, hypertension, hyperlipidemia, history of alcohol abuse who is being seen 12/08/2022 for the evaluation of CHF at the request of Dr. Waymon Amato.  History of Present Illness:   Dennis Swanson has had previous history of mildly reduced EF 40-45% in 2011, at 60-65% in 04/2015 and at 45-50% by echo in 08/2020, 50 to 55% in 12/2021.  Previously has had persistently severely reduced RV function since 2022 with plans for a sleep study now for many years however this still has not been completed.  Patient states that he had plans for sleep study to be completed this Friday.  Also does not appear that he has ever had a right heart catheterization.  Also has history of paroxysmal atrial fibrillation with no previous cardioversions.  Currently patient is being evaluated for a CHF exacerbation and worsening symptoms of orthopnea, shortness of breath, peripheral edema for the past 2 days.  From my interview he reports eating more salty foods and thinks that eating a country hand may have contributed to this.  He also states that his Lasix has been intermittently working.  Otherwise he reports compliancy with all of his medications.  He reports his baseline weight being around 242 pounds but his weight on admission here was 276 which would suggest a significant weight gain.  On arrival patient was significantly short of breath and had initially required BiPAP, but this has been weaned off and now currently maintaining room air.  He was given 1 dose of IV Lasix 60 mg initially and then  now is on IV Lasix 40 mg twice daily and states that he does have some improvement in his shortness of breath.  Labs are significant for sodium 146, potassium 5.4, creatinine 1.37 which is improving after diuresis.  BNP 534.  Troponins 31-30.  Today he has had low to soft blood pressures, narrow pulse pressures, and during my exam he is sweaty.  He has a history of alcohol abuse but states that he maybe drinks once or twice a week and generally only socially.  At times this is a 2-3 beers.   Past Medical History:  Diagnosis Date   Acute pulmonary embolism (HCC) 02/16/2009   Alcohol abuse    Atrial flutter (HCC)    CHF (congestive heart failure) (HCC)    a. EF 40-45% in 2011 b. EF at 60-65% in 04/2015 c. EF at 45-50% by echo in 09/2020   Dysrhythmia    Hypertension    Lower extremity cellulitis    bilateral   Systolic dysfunction     Past Surgical History:  Procedure Laterality Date   COLONOSCOPY N/A 05/17/2012   Procedure: COLONOSCOPY;  Surgeon: West Bali, MD;  Location: [facility];  Service: Endoscopy;  Laterality: N/A;  10:30   COLONOSCOPY WITH PROPOFOL N/A 09/15/2020   Surgeon: Earnest Bailey K, DO;  2 cm rubbery textured nodular anal mass, internal hemorrhoids, external hemorrhoids, sigmoid and descending diverticula, redundant colon.  Pathology revealed polypoid rectal mucosa with acute and chronic inflammation with erosion, granular hyperplasia and macular isolation of lamina propria, consistent with mucosal  Cardiology Consultation   Patient ID: Dennis Swanson MRN: [id number]; DOB: September 07, 1955  Admit date: 12/07/2022 Date of Consult: 12/08/2022  PCP:  Kirstie Peri, MD   [facility] Providers Cardiologist:  Dina Rich, MD   {  Patient Profile:   Dennis Swanson is a 67 y.o. male with a hx of paroxysmal atrial fibrillation, chronic heart failure with improved ejection fraction, pulmonary hypertension, PE 2011, hypertension, hyperlipidemia, history of alcohol abuse who is being seen 12/08/2022 for the evaluation of CHF at the request of Dr. Waymon Amato.  History of Present Illness:   Dennis Swanson has had previous history of mildly reduced EF 40-45% in 2011, at 60-65% in 04/2015 and at 45-50% by echo in 08/2020, 50 to 55% in 12/2021.  Previously has had persistently severely reduced RV function since 2022 with plans for a sleep study now for many years however this still has not been completed.  Patient states that he had plans for sleep study to be completed this Friday.  Also does not appear that he has ever had a right heart catheterization.  Also has history of paroxysmal atrial fibrillation with no previous cardioversions.  Currently patient is being evaluated for a CHF exacerbation and worsening symptoms of orthopnea, shortness of breath, peripheral edema for the past 2 days.  From my interview he reports eating more salty foods and thinks that eating a country hand may have contributed to this.  He also states that his Lasix has been intermittently working.  Otherwise he reports compliancy with all of his medications.  He reports his baseline weight being around 242 pounds but his weight on admission here was 276 which would suggest a significant weight gain.  On arrival patient was significantly short of breath and had initially required BiPAP, but this has been weaned off and now currently maintaining room air.  He was given 1 dose of IV Lasix 60 mg initially and then  now is on IV Lasix 40 mg twice daily and states that he does have some improvement in his shortness of breath.  Labs are significant for sodium 146, potassium 5.4, creatinine 1.37 which is improving after diuresis.  BNP 534.  Troponins 31-30.  Today he has had low to soft blood pressures, narrow pulse pressures, and during my exam he is sweaty.  He has a history of alcohol abuse but states that he maybe drinks once or twice a week and generally only socially.  At times this is a 2-3 beers.   Past Medical History:  Diagnosis Date   Acute pulmonary embolism (HCC) 02/16/2009   Alcohol abuse    Atrial flutter (HCC)    CHF (congestive heart failure) (HCC)    a. EF 40-45% in 2011 b. EF at 60-65% in 04/2015 c. EF at 45-50% by echo in 09/2020   Dysrhythmia    Hypertension    Lower extremity cellulitis    bilateral   Systolic dysfunction     Past Surgical History:  Procedure Laterality Date   COLONOSCOPY N/A 05/17/2012   Procedure: COLONOSCOPY;  Surgeon: West Bali, MD;  Location: [facility];  Service: Endoscopy;  Laterality: N/A;  10:30   COLONOSCOPY WITH PROPOFOL N/A 09/15/2020   Surgeon: Earnest Bailey K, DO;  2 cm rubbery textured nodular anal mass, internal hemorrhoids, external hemorrhoids, sigmoid and descending diverticula, redundant colon.  Pathology revealed polypoid rectal mucosa with acute and chronic inflammation with erosion, granular hyperplasia and macular isolation of lamina propria, consistent with mucosal

## 2022-12-09 ENCOUNTER — Other Ambulatory Visit (HOSPITAL_COMMUNITY): Payer: Self-pay

## 2022-12-09 ENCOUNTER — Encounter: Payer: Self-pay | Admitting: Gastroenterology

## 2022-12-09 ENCOUNTER — Encounter (HOSPITAL_COMMUNITY): Payer: Self-pay | Admitting: Internal Medicine

## 2022-12-09 ENCOUNTER — Inpatient Hospital Stay (HOSPITAL_COMMUNITY): Payer: 59

## 2022-12-09 DIAGNOSIS — I5033 Acute on chronic diastolic (congestive) heart failure: Secondary | ICD-10-CM | POA: Diagnosis not present

## 2022-12-09 DIAGNOSIS — J9601 Acute respiratory failure with hypoxia: Secondary | ICD-10-CM | POA: Diagnosis not present

## 2022-12-09 DIAGNOSIS — I4811 Longstanding persistent atrial fibrillation: Secondary | ICD-10-CM | POA: Diagnosis not present

## 2022-12-09 DIAGNOSIS — N179 Acute kidney failure, unspecified: Secondary | ICD-10-CM | POA: Diagnosis not present

## 2022-12-09 DIAGNOSIS — M7989 Other specified soft tissue disorders: Secondary | ICD-10-CM

## 2022-12-09 DIAGNOSIS — I50810 Right heart failure, unspecified: Secondary | ICD-10-CM | POA: Diagnosis not present

## 2022-12-09 DIAGNOSIS — I10 Essential (primary) hypertension: Secondary | ICD-10-CM | POA: Diagnosis not present

## 2022-12-09 LAB — CBC
HCT: 51.1 % (ref 39.0–52.0)
Hemoglobin: 15.2 g/dL (ref 13.0–17.0)
MCH: 30.2 pg (ref 26.0–34.0)
MCHC: 29.7 g/dL — ABNORMAL LOW (ref 30.0–36.0)
MCV: 101.4 fL — ABNORMAL HIGH (ref 80.0–100.0)
Platelets: 105 10*3/uL — ABNORMAL LOW (ref 150–400)
RBC: 5.04 MIL/uL (ref 4.22–5.81)
RDW: 14.1 % (ref 11.5–15.5)
WBC: 4.9 10*3/uL (ref 4.0–10.5)
nRBC: 0 % (ref 0.0–0.2)

## 2022-12-09 LAB — BASIC METABOLIC PANEL
Anion gap: 12 (ref 5–15)
BUN: 23 mg/dL (ref 8–23)
CO2: 36 mmol/L — ABNORMAL HIGH (ref 22–32)
Calcium: 8.6 mg/dL — ABNORMAL LOW (ref 8.9–10.3)
Chloride: 93 mmol/L — ABNORMAL LOW (ref 98–111)
Creatinine, Ser: 1.19 mg/dL (ref 0.61–1.24)
GFR, Estimated: >60 mL/min (ref >60)
Glucose, Bld: 92 mg/dL (ref 70–99)
Potassium: 4.3 mmol/L (ref 3.5–5.1)
Sodium: 141 mmol/L (ref 135–145)

## 2022-12-09 LAB — MAGNESIUM: Magnesium: 1.9 mg/dL (ref 1.7–2.4)

## 2022-12-09 MED ORDER — CARVEDILOL 3.125 MG PO TABS
3.1250 mg | ORAL_TABLET | Freq: Two times a day (BID) | ORAL | Status: DC
  Administered 2022-12-09 – 2022-12-11 (×4): 3.125 mg via ORAL
  Filled 2022-12-09 (×4): qty 1

## 2022-12-09 NOTE — Progress Notes (Signed)
Bilateral lower extremity venous duplex has been completed. Preliminary results can be found in CV Proc through chart review.   12/09/22 3:04 PM Olen Cordial RVT

## 2022-12-09 NOTE — Progress Notes (Signed)
Mobility Specialist Progress Note:   12/09/22 1021  Mobility  Activity Ambulated with assistance in hallway  Level of Assistance  (MinG)  Assistive Device None  Distance Ambulated (ft) 325 ft  Activity Response Tolerated well  Mobility Referral Yes  $Mobility charge 1 Mobility  Mobility Specialist Start Time (ACUTE ONLY) 0920  Mobility Specialist Stop Time (ACUTE ONLY) 0932  Mobility Specialist Time Calculation (min) (ACUTE ONLY) 12 min   Pre Mobility: 90 HR , 100% SpO2 4 L During Mobility: 103 HR , 97% SpO2 4 L  Pt received in chair, agreeable to mobility. C/o cramps in RLE, otherwise asymptomatic throughout. Pt returned to chair with call bell in reach and all needs met.   Leory Plowman  Mobility Specialist Please contact via [employer] office at [phone]

## 2022-12-09 NOTE — Plan of Care (Signed)

## 2022-12-09 NOTE — Progress Notes (Signed)
Heart Failure Stewardship Pharmacist Progress Note   PCP: Kirstie Peri, MD PCP-Cardiologist: Dina Rich, MD    HPI:  [phone] with PMH of chronic biventricular CHF, permanent A-fib, hx of PE (2011), HTN, HLD, obesity, alcohol use disorder, T2DM. Initially presented to ED on 10/21 with progressive DOE with minimal activity, progressive LE edema (weight gain ~30 lbs), abdominal distention. Denied CP. O2 sat down to 79% on RA. Required BiPAP overnight on 10/21 for acute respiratory failure.   HF exacerbation potentially due to dietary excursions - eating salty foods and consuming alcohol. CXR 10/21 demonstrated stable cardiomegaly, clear lungs. Echo 10/22 demonstrated EF 50-55%, LV low-normal function with no RWMA, moderate LVH, indeterminate diastolic parameters, RV function severely reduced, enlarged RV, severely elevated PA pressure. Findings similar to study in Nov 2023, overall improved EF from 45-50% in 2022. [place] scheduled to r/o DVT given LE swelling L > R. Scr and BNP elevated on admission. Does not appear that pt has every had ischemic work up.   Upon interview pt states that he is feeling slightly better - still on O2 via [place] (last charted at 4L). Not on O2 at home. Still has bilateral pitting edema with increased swelling in the L lower extremity. Denies dizziness/lightheadedness when moving from laying to sitting and bed to chair. Reports that he was tolerating all his medications very well prior to discharge. Denies missed doses of medications. Organizes his own medications at home. Reports that he occasionally monitors his BP at home and it is usually 120-130s/80s - which is consistent with documented outpatient appt readings.   Reports that he has not had a worsening HF event in several years (appears last HF admission was 2022). Believes worsening condition may have been exacerbated by eating salty food (specifically catfish).  Current HF Medications: Diuretic: furosemide 40 mg IV  BID Beta Blocker: carvedilol 3.125 mg PO BID (decreased from 6.25)  Prior to admission HF Medications: Diuretic: furosemide 40 mg PO BID Beta blocker: carvedilol 6.25 mg PO BID ACE/ARB/ARNI: Entresto (sacubitril/valsartan) 24-26 mg PO BID MRA: spironolactone 25 mg PO daily.    Fill hx good for maintenance medications  Pertinent Lab Values: Serum creatinine 1.19 (at BL) , BUN 23, Potassium 4.3, Sodium 141, BNP 533, Trop 31>30, Magnesium 1.8  Vital Signs: Weight: 271.3 lbs (admission weight: 276.68 lbs) - dry weight ~255 lbs Blood pressure: 90-100s/60-80s  Heart rate: 80s (NSR vs Afib) I/O: net -0.5L yesterday; net -2.3L since admission - pt reports that I/Os may not be completely accurate  Medication Assistance / Insurance Benefits Check: Does the patient have prescription insurance?  Yes Type of insurance plan: [employer]HC Dual Complete Medicare/Medicaid  Farxiga/Jardiance copay $0/30ds  Does the patient qualify for medication assistance through manufacturers or grants?   No  Outpatient Pharmacy:  Prior to admission outpatient pharmacy: [facility] in [place] Is the patient willing to use [facility] TOC pharmacy at discharge? Yes Is the patient willing to transition their outpatient pharmacy to utilize a [facility] outpatient pharmacy?   Pending    Assessment: 1. Acute on chronic biventricular CHF (LVEF 50-55%), HFimpEF, unclear etiology. NYHA class IV symptoms. - Pt with remaining bilateral pitting edema despite IV furosemide- may need dose intensification to accelerate diuresis given suspected weight gain of ~20 lbs. Scr has improved since admission - cont to monitor. - BP continues to be low/normal, without s/sx of hypotension. Pt would likely benefit from addition of SGLT2i to help with diuresis without further lowering of BP. Pt does have a  hx of A1c elevated to 9.6%, but last checked in 2022 and was 5.5%. BG wnl this admission - consider rechecking A1c.  - Continue to monitor BP, Scr, to  guide reinitiation of home ARNI and MRA.    Plan: 1) Medication changes recommended at this time: - Continue carvedilol 3.125 mg PO BID - Continue furosemide 40 mg IV BID - Consider initiation of Farxiga (dapagliflozin) 10 mg PO daily tomorrow - Continue to monitor BP to guide escalation of GDMT  2) Patient assistance: - No patient assistance needs noted at this time  3)  Education  - Initial education completed including importance of adherence to maintenance medications and dietary restrictions to prevent fluid accumulation. Patient aware that medications will be adjusted while admitted, and may continue to be adjusted post-discharge. Full education to be completed prior to discharge  Nils Pyle, PharmD PGY1 Pharmacy Resident

## 2022-12-09 NOTE — Progress Notes (Signed)
This test is not yet approved or cleared by the [place] and has been authorized for detection and/or diagnosis of SARS-CoV-2 by FDA under an Emergency Use Authorization (EUA). This EUA will remain in effect (meaning this test can be used) for the duration of the COVID-19 declaration under Section 564(b)(1) of the Act, 21 U.S.C. section 360bbb-3(b)(1), unless the authorization is terminated or revoked.  Performed at [facility], [address], [place], [place] [address]     Radiology Studies:  ECHOCARDIOGRAM COMPLETE  Result Date: 12/08/2022    ECHOCARDIOGRAM REPORT   Patient Name:   Dennis Swanson Date of Exam: 12/08/2022 Medical Rec #:  [id number]           Height:       72.0 in Accession #:    [id number]          Weight:       276.7 lb Date of Birth:   Oct 21, 1955            BSA:          2.446 m Patient Age:    67 years            BP:           115/82 mmHg Patient Gender: M                   HR:           80 bpm. Exam Location:  Inpatient Procedure: 2D Echo, 3D Echo, Cardiac Doppler and Color Doppler Indications:    I50.40* Unspecified combined systolic (congestive) and diastolic                 (congestive) heart failure  History:        Patient has prior history of Echocardiogram examinations, most                 recent 01/06/2022. CHF and Cardiomyopathy, Abnormal ECG,                 Arrythmias:Atrial Fibrillation, Signs/Symptoms:Chest Pain; Risk                 Factors:Hypertension. ETOH. Pulmonary embolus. Right ventricular                 failure.  Sonographer:    Sheralyn Boatman RDCS Referring Phys: [id number] RONDELL A SMITH IMPRESSIONS  1. Left ventricular ejection fraction, by estimation, is 50 to 55%. Left ventricular ejection fraction by 3D volume is 58 %. The left ventricle has low normal function. The left ventricle has no regional wall motion abnormalities. There is moderate left  ventricular hypertrophy. Left ventricular diastolic parameters are indeterminate. There is the interventricular septum is flattened in systole and diastole, consistent with right ventricular pressure and volume overload.  2. Right ventricular systolic function is severely reduced. The right ventricular size is severely enlarged. There is severely elevated pulmonary artery systolic pressure. The estimated right ventricular systolic pressure is 71.0 mmHg.  3. Right atrial size was severely dilated.  4. The mitral valve is normal in structure. Trivial mitral valve regurgitation. No evidence of mitral stenosis.  5. Tricuspid valve regurgitation is severe.  6. The aortic valve is calcified. There is mild calcification of the aortic valve. There is mild thickening of the aortic valve. Aortic valve regurgitation is not visualized. Aortic valve sclerosis is present, with no evidence of  aortic valve stenosis.  7. Aortic dilatation noted. Aneurysm of the ascending  PROGRESS NOTE   Dennis Swanson  XBM:[id number]    DOB: 03-Dec-1955    DOA: 12/07/2022  PCP: Kirstie Peri, MD     Brief Hospital Course:  67 year old male, lives alone, independent, medical history significant for chronic systolic CHF, A-fib, pulmonary embolism on Eliquis, HTN, HLD, obesity, chronic iron deficiency anemia, alcohol use disorder, who presented to the ED on 12/07/2022 with complaints of progressive dyspnea on exertion with minimal activity, progressive lower extremity edema, weight gain possibly up to 30 pounds, abdominal distention but no chest pain.  Reports dietary indiscretion and eating salty food and consuming alcohol.  In the ED, oxygen saturations in the 79% on room air, required 6 L/min [place] oxygen to bring up saturations to 92%.  Overnight of admission, required BiPAP for acute on chronic hypoxic and hypercapnic respiratory failure.  Admitted for acute on chronic diastolic CHF, acute on chronic hypoxic and hypercapnic respiratory failure.   Assessment & Plan:   Acute on suspected chronic hypoxic and hypercapnic respiratory failure Multifactorial due to decompensated CHF complicating underlying suspected OSA/OHS, ?? COPD/asthma. VBG on admission: pH 7.26, pCO2 111, pO2 <31. Presented with oxygen saturation of 79% on room air on admission. Patient required BiPAP in the night of admission but then transitioned to nasal cannula. Has seen pulmonology in July 2024, assessed to have biventricular heart failure, remote PE, negative collagen studies, non-smoker, planned for home sleep testing.  Will allow this to be pursued further in the outpatient setting.  Will need evaluation for home oxygen prior to discharge.  Acute on chronic diastolic CHF TTE 02/72/5366: LVEF 50-55%, LV diastolic dysfunction could not be evaluated.  No aortic stenosis.  Recovered EF compared to prior. HS Troponin 31 > 30, minimally elevated, flat trend, no chest pain, suspect demand ischemia. Patient  remains on IV furosemide.  Monitor ins and outs and daily weights.  Cardiology is following.    A-fib with controlled ventricular rate: Continue carvedilol and apixaban  Acute kidney injury complicating stage II CKD/hyperkalemia Last known creatinine 1.17 in May 23.   Presented with creatinine of 1.65,?  Cardiorenal syndrome Seems to be improving.  Creatinine down to 1.19 today.  Monitor electrolytes. Aldactone on hold due to hyperkalemia.  Potassium level has improved.  Hypernatremia, mild Sodium level has improved  Thrombocytopenia Noted to be better this morning.  Essential hypertension Controlled on carvedilol alone for now. Holding Aldactone and Entresto due to AKI and hyperkalemia Blood pressure noted to be somewhat borderline low.  Continue to hold these medications.  May need to further decrease the dose of carvedilol.  History of pulmonary embolism Continue Eliquis Left lower extremity asymmetrically swollen compared to right, may be all edema but rule out DVT by venous Doppler. Claims compliance to Eliquis.  Alcohol use disorder Does not appear to be forthcoming about the exact amount of alcohol he consumes. CIWA protocol. No evidence of withdrawal currently.  Suspected sleep apnea Outpatient follow-up with pulmonology for sleep study. CPAP at bedtime  Chronic pain Continue prior home dose of opioids.  Avoid escalation.  Alcohol use,?  Disorder CIWA protocol.  Body mass index is 36.79 kg/m./Obesity Lifestyle modifications and weight loss as outpatient.   DVT prophylaxis:   On apixaban anticoagulation   Code Status: Full Code:  Family Communication: None at bedside Disposition:  Status is: Inpatient Remains inpatient appropriate because: IV Lasix     Consultants:   Cardiology  Procedures:   BiPAP   Subjective:  Feels better.  Legs are still swollen  This test is not yet approved or cleared by the [place] and has been authorized for detection and/or diagnosis of SARS-CoV-2 by FDA under an Emergency Use Authorization (EUA). This EUA will remain in effect (meaning this test can be used) for the duration of the COVID-19 declaration under Section 564(b)(1) of the Act, 21 U.S.C. section 360bbb-3(b)(1), unless the authorization is terminated or revoked.  Performed at [facility], [address], [place], [place] [address]     Radiology Studies:  ECHOCARDIOGRAM COMPLETE  Result Date: 12/08/2022    ECHOCARDIOGRAM REPORT   Patient Name:   Dennis Swanson Date of Exam: 12/08/2022 Medical Rec #:  [id number]           Height:       72.0 in Accession #:    [id number]          Weight:       276.7 lb Date of Birth:   Oct 21, 1955            BSA:          2.446 m Patient Age:    67 years            BP:           115/82 mmHg Patient Gender: M                   HR:           80 bpm. Exam Location:  Inpatient Procedure: 2D Echo, 3D Echo, Cardiac Doppler and Color Doppler Indications:    I50.40* Unspecified combined systolic (congestive) and diastolic                 (congestive) heart failure  History:        Patient has prior history of Echocardiogram examinations, most                 recent 01/06/2022. CHF and Cardiomyopathy, Abnormal ECG,                 Arrythmias:Atrial Fibrillation, Signs/Symptoms:Chest Pain; Risk                 Factors:Hypertension. ETOH. Pulmonary embolus. Right ventricular                 failure.  Sonographer:    Sheralyn Boatman RDCS Referring Phys: [id number] RONDELL A SMITH IMPRESSIONS  1. Left ventricular ejection fraction, by estimation, is 50 to 55%. Left ventricular ejection fraction by 3D volume is 58 %. The left ventricle has low normal function. The left ventricle has no regional wall motion abnormalities. There is moderate left  ventricular hypertrophy. Left ventricular diastolic parameters are indeterminate. There is the interventricular septum is flattened in systole and diastole, consistent with right ventricular pressure and volume overload.  2. Right ventricular systolic function is severely reduced. The right ventricular size is severely enlarged. There is severely elevated pulmonary artery systolic pressure. The estimated right ventricular systolic pressure is 71.0 mmHg.  3. Right atrial size was severely dilated.  4. The mitral valve is normal in structure. Trivial mitral valve regurgitation. No evidence of mitral stenosis.  5. Tricuspid valve regurgitation is severe.  6. The aortic valve is calcified. There is mild calcification of the aortic valve. There is mild thickening of the aortic valve. Aortic valve regurgitation is not visualized. Aortic valve sclerosis is present, with no evidence of  aortic valve stenosis.  7. Aortic dilatation noted. Aneurysm of the ascending  PROGRESS NOTE   Dennis Swanson  XBM:[id number]    DOB: 03-Dec-1955    DOA: 12/07/2022  PCP: Kirstie Peri, MD     Brief Hospital Course:  67 year old male, lives alone, independent, medical history significant for chronic systolic CHF, A-fib, pulmonary embolism on Eliquis, HTN, HLD, obesity, chronic iron deficiency anemia, alcohol use disorder, who presented to the ED on 12/07/2022 with complaints of progressive dyspnea on exertion with minimal activity, progressive lower extremity edema, weight gain possibly up to 30 pounds, abdominal distention but no chest pain.  Reports dietary indiscretion and eating salty food and consuming alcohol.  In the ED, oxygen saturations in the 79% on room air, required 6 L/min [place] oxygen to bring up saturations to 92%.  Overnight of admission, required BiPAP for acute on chronic hypoxic and hypercapnic respiratory failure.  Admitted for acute on chronic diastolic CHF, acute on chronic hypoxic and hypercapnic respiratory failure.   Assessment & Plan:   Acute on suspected chronic hypoxic and hypercapnic respiratory failure Multifactorial due to decompensated CHF complicating underlying suspected OSA/OHS, ?? COPD/asthma. VBG on admission: pH 7.26, pCO2 111, pO2 <31. Presented with oxygen saturation of 79% on room air on admission. Patient required BiPAP in the night of admission but then transitioned to nasal cannula. Has seen pulmonology in July 2024, assessed to have biventricular heart failure, remote PE, negative collagen studies, non-smoker, planned for home sleep testing.  Will allow this to be pursued further in the outpatient setting.  Will need evaluation for home oxygen prior to discharge.  Acute on chronic diastolic CHF TTE 02/72/5366: LVEF 50-55%, LV diastolic dysfunction could not be evaluated.  No aortic stenosis.  Recovered EF compared to prior. HS Troponin 31 > 30, minimally elevated, flat trend, no chest pain, suspect demand ischemia. Patient  remains on IV furosemide.  Monitor ins and outs and daily weights.  Cardiology is following.    A-fib with controlled ventricular rate: Continue carvedilol and apixaban  Acute kidney injury complicating stage II CKD/hyperkalemia Last known creatinine 1.17 in May 23.   Presented with creatinine of 1.65,?  Cardiorenal syndrome Seems to be improving.  Creatinine down to 1.19 today.  Monitor electrolytes. Aldactone on hold due to hyperkalemia.  Potassium level has improved.  Hypernatremia, mild Sodium level has improved  Thrombocytopenia Noted to be better this morning.  Essential hypertension Controlled on carvedilol alone for now. Holding Aldactone and Entresto due to AKI and hyperkalemia Blood pressure noted to be somewhat borderline low.  Continue to hold these medications.  May need to further decrease the dose of carvedilol.  History of pulmonary embolism Continue Eliquis Left lower extremity asymmetrically swollen compared to right, may be all edema but rule out DVT by venous Doppler. Claims compliance to Eliquis.  Alcohol use disorder Does not appear to be forthcoming about the exact amount of alcohol he consumes. CIWA protocol. No evidence of withdrawal currently.  Suspected sleep apnea Outpatient follow-up with pulmonology for sleep study. CPAP at bedtime  Chronic pain Continue prior home dose of opioids.  Avoid escalation.  Alcohol use,?  Disorder CIWA protocol.  Body mass index is 36.79 kg/m./Obesity Lifestyle modifications and weight loss as outpatient.   DVT prophylaxis:   On apixaban anticoagulation   Code Status: Full Code:  Family Communication: None at bedside Disposition:  Status is: Inpatient Remains inpatient appropriate because: IV Lasix     Consultants:   Cardiology  Procedures:   BiPAP   Subjective:  Feels better.  Legs are still swollen  PROGRESS NOTE   Dennis Swanson  [id number]:[id number]    DOB: 03-Dec-1955    DOA: 12/07/2022  PCP: Kirstie Peri, MD     Brief Hospital Course:  67 year old male, lives alone, independent, medical history significant for chronic systolic CHF, A-fib, pulmonary embolism on Eliquis, HTN, HLD, obesity, chronic iron deficiency anemia, alcohol use disorder, who presented to the ED on 12/07/2022 with complaints of progressive dyspnea on exertion with minimal activity, progressive lower extremity edema, weight gain possibly up to 30 pounds, abdominal distention but no chest pain.  Reports dietary indiscretion and eating salty food and consuming alcohol.  In the ED, oxygen saturations in the 79% on room air, required 6 L/min [place] oxygen to bring up saturations to 92%.  Overnight of admission, required BiPAP for acute on chronic hypoxic and hypercapnic respiratory failure.  Admitted for acute on chronic diastolic CHF, acute on chronic hypoxic and hypercapnic respiratory failure.   Assessment & Plan:   Acute on suspected chronic hypoxic and hypercapnic respiratory failure Multifactorial due to decompensated CHF complicating underlying suspected OSA/OHS, ?? COPD/asthma. VBG on admission: pH 7.26, pCO2 111, pO2 <31. Presented with oxygen saturation of 79% on room air on admission. Patient required BiPAP in the night of admission but then transitioned to nasal cannula. Has seen pulmonology in July 2024, assessed to have biventricular heart failure, remote PE, negative collagen studies, non-smoker, planned for home sleep testing.  Will allow this to be pursued further in the outpatient setting.  Will need evaluation for home oxygen prior to discharge.  Acute on chronic diastolic CHF TTE 02/72/5366: LVEF 50-55%, LV diastolic dysfunction could not be evaluated.  No aortic stenosis.  Recovered EF compared to prior. HS Troponin 31 > 30, minimally elevated, flat trend, no chest pain, suspect demand ischemia. Patient  remains on IV furosemide.  Monitor ins and outs and daily weights.  Cardiology is following.    A-fib with controlled ventricular rate: Continue carvedilol and apixaban  Acute kidney injury complicating stage II CKD/hyperkalemia Last known creatinine 1.17 in May 23.   Presented with creatinine of 1.65,?  Cardiorenal syndrome Seems to be improving.  Creatinine down to 1.19 today.  Monitor electrolytes. Aldactone on hold due to hyperkalemia.  Potassium level has improved.  Hypernatremia, mild Sodium level has improved  Thrombocytopenia Noted to be better this morning.  Essential hypertension Controlled on carvedilol alone for now. Holding Aldactone and Entresto due to AKI and hyperkalemia Blood pressure noted to be somewhat borderline low.  Continue to hold these medications.  May need to further decrease the dose of carvedilol.  History of pulmonary embolism Continue Eliquis Left lower extremity asymmetrically swollen compared to right, may be all edema but rule out DVT by venous Doppler. Claims compliance to Eliquis.  Alcohol use disorder Does not appear to be forthcoming about the exact amount of alcohol he consumes. CIWA protocol. No evidence of withdrawal currently.  Suspected sleep apnea Outpatient follow-up with pulmonology for sleep study. CPAP at bedtime  Chronic pain Continue prior home dose of opioids.  Avoid escalation.  Alcohol use,?  Disorder CIWA protocol.  Body mass index is 36.79 kg/m./Obesity Lifestyle modifications and weight loss as outpatient.   DVT prophylaxis:   On apixaban anticoagulation   Code Status: Full Code:  Family Communication: None at bedside Disposition:  Status is: Inpatient Remains inpatient appropriate because: IV Lasix     Consultants:   Cardiology  Procedures:   BiPAP   Subjective:  Feels better.  Legs are still swollen  This test is not yet approved or cleared by the [place] and has been authorized for detection and/or diagnosis of SARS-CoV-2 by FDA under an Emergency Use Authorization (EUA). This EUA will remain in effect (meaning this test can be used) for the duration of the COVID-19 declaration under Section 564(b)(1) of the Act, 21 U.S.C. section 360bbb-3(b)(1), unless the authorization is terminated or revoked.  Performed at [facility], [address], [place], [place] [address]     Radiology Studies:  ECHOCARDIOGRAM COMPLETE  Result Date: 12/08/2022    ECHOCARDIOGRAM REPORT   Patient Name:   Dennis Swanson Date of Exam: 12/08/2022 Medical Rec #:  [id number]           Height:       72.0 in Accession #:    [id number]          Weight:       276.7 lb Date of Birth:   Oct 21, 1955            BSA:          2.446 m Patient Age:    67 years            BP:           115/82 mmHg Patient Gender: M                   HR:           80 bpm. Exam Location:  Inpatient Procedure: 2D Echo, 3D Echo, Cardiac Doppler and Color Doppler Indications:    I50.40* Unspecified combined systolic (congestive) and diastolic                 (congestive) heart failure  History:        Patient has prior history of Echocardiogram examinations, most                 recent 01/06/2022. CHF and Cardiomyopathy, Abnormal ECG,                 Arrythmias:Atrial Fibrillation, Signs/Symptoms:Chest Pain; Risk                 Factors:Hypertension. ETOH. Pulmonary embolus. Right ventricular                 failure.  Sonographer:    Sheralyn Boatman RDCS Referring Phys: [id number] RONDELL A SMITH IMPRESSIONS  1. Left ventricular ejection fraction, by estimation, is 50 to 55%. Left ventricular ejection fraction by 3D volume is 58 %. The left ventricle has low normal function. The left ventricle has no regional wall motion abnormalities. There is moderate left  ventricular hypertrophy. Left ventricular diastolic parameters are indeterminate. There is the interventricular septum is flattened in systole and diastole, consistent with right ventricular pressure and volume overload.  2. Right ventricular systolic function is severely reduced. The right ventricular size is severely enlarged. There is severely elevated pulmonary artery systolic pressure. The estimated right ventricular systolic pressure is 71.0 mmHg.  3. Right atrial size was severely dilated.  4. The mitral valve is normal in structure. Trivial mitral valve regurgitation. No evidence of mitral stenosis.  5. Tricuspid valve regurgitation is severe.  6. The aortic valve is calcified. There is mild calcification of the aortic valve. There is mild thickening of the aortic valve. Aortic valve regurgitation is not visualized. Aortic valve sclerosis is present, with no evidence of  aortic valve stenosis.  7. Aortic dilatation noted. Aneurysm of the ascending

## 2022-12-09 NOTE — Progress Notes (Signed)
Heart Failure Nurse Navigator Progress Note  PCP: Kirstie Peri, MD PCP-Cardiologist: Branch Admission Diagnosis: Leg Edema, Acute Respiratory Failure with Hypoxia, Failure of Outpatient Treatment Admitted from: Home  Presentation:   Rush Barer presented with leg swelling, shortness of breath, and fatigue. BNP 533.  ECHO/ LVEF: 50-55%  Clinical Course:  Past Medical History:  Diagnosis Date   Acute pulmonary embolism (HCC) 02/16/2009   Alcohol abuse    Atrial flutter (HCC)    CHF (congestive heart failure) (HCC)    a. EF 40-45% in 2011 b. EF at 60-65% in 04/2015 c. EF at 45-50% by echo in 09/2020   Dysrhythmia    Hypertension    Lower extremity cellulitis    bilateral   Systolic dysfunction      Social History   Socioeconomic History   Marital status: Single    Spouse name: Not on file   Number of children: 2   Years of education: Not on file   Highest education level: 10th grade  Occupational History   Occupation: Unemployed, previuosly yard Arts administrator nd Optometrist  Tobacco Use   Smoking status: Never   Smokeless tobacco: Never  Vaping Use   Vaping status: Never Used  Substance and Sexual Activity   Alcohol use: Yes    Alcohol/week: 3.0 standard drinks of alcohol    Types: 3 Cans of beer per week    Comment: 1-2 beer a week   Drug use: No   Sexual activity: Not Currently  Other Topics Concern   Not on file  Social History Narrative   Lives alone, 2 daughters   No regular exercise   Social Determinants of Health   Financial Resource Strain: Low Risk  (12/09/2022)   Overall Financial Resource Strain (CARDIA)    Difficulty of Paying Living Expenses: Not hard at all  Food Insecurity: No Food Insecurity (12/07/2022)   Hunger Vital Sign    Worried About Running Out of Food in the Last Year: Never true    Ran Out of Food in the Last Year: Never true  Transportation Needs: No Transportation Needs (12/09/2022)   PRAPARE - Scientist, research (physical sciences) (Medical): No    Lack of Transportation (Non-Medical): No  Physical Activity: Inactive (10/02/2020)   Exercise Vital Sign    Days of Exercise per Week: 0 days    Minutes of Exercise per Session: 0 min  Stress: Not on file  Social Connections: Not on file   Education Assessment and Provision:  Detailed education and instructions provided on heart failure disease management including the following:  Signs and symptoms of Heart Failure When to call the physician Importance of daily weights Low sodium diet Fluid restriction Medication management Anticipated future follow-up appointments  Patient education given on each of the above topics.  Patient acknowledges understanding via teach back method and acceptance of all instructions.  Education Materials:  "Living Better With Heart Failure" Booklet, HF zone tool, & Daily Weight Tracker Tool.  Patient has scale at home: Yes Patient has pill box at home: Yes    High Risk Criteria for Readmission and/or Poor Patient Outcomes: Heart failure hospital admissions (last 6 months): 0  No Show rate: 13 Difficult social situation: None Demonstrates medication adherence: Yes Primary Language: English Literacy level: Reading, Writing and Comprehension.  Barriers of Care:   Diet & Fluid Restrictions Daily Weights   Considerations/Referrals:   Referral made to Heart Failure Pharmacist Stewardship: Yes Referral made to Heart Failure CSW/NCM  TOC: No Referral made to Heart & Vascular TOC clinic: Yes  Items for Follow-up on DC/TOC: Diet & Fluid Restrictions Daily Weights Prefers follow-up appointments in [place] because its closer.   Roxy Horseman, RN, BSN [facility] Heart Failure Navigator Secure Chat Only

## 2022-12-09 NOTE — TOC Benefit Eligibility Note (Signed)
Patient Product/process development scientist completed.    The patient is insured through [facility]. Patient has Medicare and is not eligible for a copay card, but may be able to apply for patient assistance, if available.    Ran test claim for Farxiga 10 mg and the current 30 day co-pay is $0.00.  Ran test claim for Jardiance 10 mg and the current 30 day co-pay is $0.00.  This test claim was processed through [facility]- copay amounts may vary at other pharmacies due to pharmacy/plan contracts, or as the patient moves through the different stages of their insurance plan.     Roland Earl, CPHT Pharmacy Technician III Certified Patient Advocate [facility] Patient Advocate Team Direct Number: [phone]  Fax: [phone]

## 2022-12-09 NOTE — Progress Notes (Signed)
Rounding Note    Patient Name: Dennis Swanson Date of Encounter: 12/09/2022  [facility] Cardiologist: Dina Rich, MD   Subjective   Recorded as net -550 cc though incomplete I/Os.  Renal function continues to improve (creatinine 1.65 > 1.37 > 1.19).  BP 96/66 this morning.  Weight down 5 pounds from yesterday.  Reports no chest pain, dyspnea improving  Inpatient Medications    Scheduled Meds:  apixaban  5 mg Oral BID   carvedilol  6.25 mg Oral BID WC   folic acid  1 mg Oral Daily   furosemide  40 mg Intravenous BID   ipratropium-albuterol  3 mL Nebulization BID   multivitamin with minerals  1 tablet Oral Daily   pantoprazole  40 mg Oral QAC breakfast   sodium chloride flush  3 mL Intravenous Q12H   thiamine  100 mg Oral Daily   Or   thiamine  100 mg Intravenous Daily   Continuous Infusions:  PRN Meds: acetaminophen, oxyCODONE **AND** acetaminophen, albuterol, LORazepam **OR** [DISCONTINUED] LORazepam, ondansetron (ZOFRAN) IV, sodium chloride flush   Vital Signs    Vitals:   12/09/22 0429 12/09/22 0551 12/09/22 0754 12/09/22 0858  BP: 120/84  96/66   Pulse: 87  88 84  Resp: 20  18 18   Temp: 98.6 F (37 C)  98.1 F (36.7 C)   TempSrc: Oral  Oral   SpO2: 99%  96% 97%  Weight:  123.1 kg    Height:        Intake/Output Summary (Last 24 hours) at 12/09/2022 1007 Last data filed at 12/09/2022 0900 Gross per 24 hour  Intake 200 ml  Output 1650 ml  Net -1450 ml      12/09/2022    5:51 AM 12/08/2022    4:53 AM 12/07/2022   12:11 PM  Last 3 Weights  Weight (lbs) 271 lb 4.8 oz 276 lb 10.8 oz 255 lb 1.2 oz  Weight (kg) 123.061 kg 125.5 kg 115.7 kg      Telemetry    Off tele this morning- Personally Reviewed  ECG    No new ECG - Personally Reviewed  Physical Exam   GEN: No acute distress.   Neck: + JVD Cardiac: irregular, normal rate, no murmurs Respiratory: Clear to auscultation bilaterally. GI: Soft, nontender MS: No  edema; No deformity. Neuro:  Nonfocal  Psych: Normal affect   Labs    High Sensitivity Troponin:   Recent Labs  Lab 12/07/22 1242 12/07/22 1836  TROPONINIHS 31* 30*     Chemistry Recent Labs  Lab 12/07/22 1242 12/07/22 1258 12/08/22 0503 12/09/22 0654  NA 143 144 146* 141  K 4.3 4.3 5.4* 4.3  CL 95* 96* 95* 93*  CO2 40*  --  37* 36*  GLUCOSE 99 93 82 92  BUN 29* 34* 25* 23  CREATININE 1.65* 1.60* 1.37* 1.19  CALCIUM 8.9  --  9.4 8.6*  MG 1.8  --   --   --   PROT 6.3*  --   --   --   ALBUMIN 3.4*  --   --   --   AST 20  --   --   --   ALT 20  --   --   --   ALKPHOS 45  --   --   --   BILITOT 0.8  --   --   --   GFRNONAA 45*  --  56* >60  ANIONGAP 8  --  14  12    Lipids No results for input(s): "CHOL", "TRIG", "HDL", "LABVLDL", "LDLCALC", "CHOLHDL" in the last 168 hours.  Hematology Recent Labs  Lab 12/07/22 1215 12/07/22 1258 12/08/22 0914 12/09/22 0328  WBC 4.7  --  4.3 4.9  RBC 4.84  --  4.92 5.04  HGB 14.5 16.7 14.9 15.2  HCT 48.1 49.0 49.4 51.1  MCV 99.4  --  100.4* 101.4*  MCH 30.0  --  30.3 30.2  MCHC 30.1  --  30.2 29.7*  RDW 14.2  --  14.3 14.1  PLT 117*  --  99* 105*   Thyroid No results for input(s): "TSH", "FREET4" in the last 168 hours.  BNP Recent Labs  Lab 12/07/22 1836  BNP 533.6*    DDimer No results for input(s): "DDIMER" in the last 168 hours.   Radiology    ECHOCARDIOGRAM COMPLETE  Result Date: 12/08/2022    ECHOCARDIOGRAM REPORT   Patient Name:   Dennis Swanson Date of Exam: 12/08/2022 Medical Rec #:  [id number]           Height:       72.0 in Accession #:    [id number]          Weight:       276.7 lb Date of Birth:  [birth year]            BSA:          2.446 m Patient Age:    67 years            BP:           115/82 mmHg Patient Gender: M                   HR:           80 bpm. Exam Location:  Inpatient Procedure: 2D Echo, 3D Echo, Cardiac Doppler and Color Doppler Indications:    I50.40* Unspecified combined systolic  (congestive) and diastolic                 (congestive) heart failure  History:        Patient has prior history of Echocardiogram examinations, most                 recent 01/06/2022. CHF and Cardiomyopathy, Abnormal ECG,                 Arrythmias:Atrial Fibrillation, Signs/Symptoms:Chest Pain; Risk                 Factors:Hypertension. ETOH. Pulmonary embolus. Right ventricular                 failure.  Sonographer:    Sheralyn Boatman RDCS Referring Phys: [id number] RONDELL A SMITH IMPRESSIONS  1. Left ventricular ejection fraction, by estimation, is 50 to 55%. Left ventricular ejection fraction by 3D volume is 58 %. The left ventricle has low normal function. The left ventricle has no regional wall motion abnormalities. There is moderate left  ventricular hypertrophy. Left ventricular diastolic parameters are indeterminate. There is the interventricular septum is flattened in systole and diastole, consistent with right ventricular pressure and volume overload.  2. Right ventricular systolic function is severely reduced. The right ventricular size is severely enlarged. There is severely elevated pulmonary artery systolic pressure. The estimated right ventricular systolic pressure is 71.0 mmHg.  3. Right atrial size was severely dilated.  4. The mitral valve is normal in structure. Trivial mitral valve regurgitation. No evidence of mitral  stenosis.  5. Tricuspid valve regurgitation is severe.  6. The aortic valve is calcified. There is mild calcification of the aortic valve. There is mild thickening of the aortic valve. Aortic valve regurgitation is not visualized. Aortic valve sclerosis is present, with no evidence of aortic valve stenosis.  7. Aortic dilatation noted. Aneurysm of the ascending aorta, measuring 48 mm. There is moderate dilatation of the ascending aorta, measuring 48 mm.  8. The inferior vena cava is dilated in size with <50% respiratory variability, suggesting right atrial pressure of 15 mmHg.  Comparison(s): Prior 2023 - Aorta ascending measures 40 mm. 2022 CT ascending aorta 42 mm. Consider repeat CT chest to measure aorta. FINDINGS  Left Ventricle: Left ventricular ejection fraction, by estimation, is 50 to 55%. Left ventricular ejection fraction by 3D volume is 58 %. The left ventricle has low normal function. The left ventricle has no regional wall motion abnormalities. The left ventricular internal cavity size was normal in size. There is moderate left ventricular hypertrophy. The interventricular septum is flattened in systole and diastole, consistent with right ventricular pressure and volume overload. Left ventricular diastolic parameters are indeterminate. Right Ventricle: The right ventricular size is severely enlarged. No increase in right ventricular wall thickness. Right ventricular systolic function is severely reduced. There is severely elevated pulmonary artery systolic pressure. The tricuspid regurgitant velocity is 3.74 m/s, and with an assumed right atrial pressure of 15 mmHg, the estimated right ventricular systolic pressure is 71.0 mmHg. Left Atrium: Left atrial size was normal in size. Right Atrium: Right atrial size was severely dilated. Pericardium: There is no evidence of pericardial effusion. Mitral Valve: The mitral valve is normal in structure. Trivial mitral valve regurgitation. No evidence of mitral valve stenosis. Tricuspid Valve: The tricuspid valve is normal in structure. Tricuspid valve regurgitation is severe. No evidence of tricuspid stenosis. Aortic Valve: The aortic valve is calcified. There is mild calcification of the aortic valve. There is mild thickening of the aortic valve. Aortic valve regurgitation is not visualized. Aortic valve sclerosis is present, with no evidence of aortic valve stenosis. Pulmonic Valve: The pulmonic valve was normal in structure. Pulmonic valve regurgitation is mild. No evidence of pulmonic stenosis. Aorta: Aortic dilatation noted. There  is moderate dilatation of the ascending aorta, measuring 48 mm. There is an aneurysm involving the ascending aorta measuring 48 mm. Venous: The inferior vena cava is dilated in size with less than 50% respiratory variability, suggesting right atrial pressure of 15 mmHg. IAS/Shunts: No atrial level shunt detected by color flow Doppler.  LEFT VENTRICLE PLAX 2D LVIDd:         5.00 cm LVIDs:         3.30 cm LV PW:         1.30 cm         3D Volume EF LV IVS:        1.50 cm         LV 3D EF:    Left LVOT diam:     2.50 cm                      ventricul LV SV:         80                           ar LV SV Index:   33  ejection LVOT Area:     4.91 cm                     fraction                                             by 3D                                             volume is LV Volumes (MOD)                            58 %. LV vol d, MOD    91.6 ml A2C: LV vol d, MOD    61.4 ml       3D Volume EF: A4C:                           3D EF:        58 % LV vol s, MOD    46.0 ml       LV EDV:       121 ml A2C:                           LV ESV:       51 ml LV vol s, MOD    27.1 ml       LV SV:        71 ml A4C: LV SV MOD A2C:   45.6 ml LV SV MOD A4C:   61.4 ml LV SV MOD BP:    38.9 ml RIGHT VENTRICLE            IVC RV S prime:     8.81 cm/s  IVC diam: 3.60 cm RVOT diam:      4.10 cm TAPSE (M-mode): 1.7 cm LEFT ATRIUM             Index        RIGHT ATRIUM           Index LA diam:        5.40 cm 2.21 cm/m   RA Area:     47.60 cm LA Vol (A2C):   93.7 ml 38.31 ml/m  RA Volume:   203.00 ml 83.00 ml/m LA Vol (A4C):   42.0 ml 17.17 ml/m LA Biplane Vol: 68.2 ml 27.89 ml/m  AORTIC VALVE LVOT Vmax:   89.60 cm/s LVOT Vmean:  60.300 cm/s LVOT VTI:    0.162 m  AORTA Ao Root diam: 3.70 cm Ao Asc diam:  4.75 cm MITRAL VALVE               TRICUSPID VALVE MV Area (PHT): 4.21 cm    TR Peak grad:   56.0 mmHg MV Decel Time: 180 msec    TR Vmax:        374.00 cm/s MV E velocity: 99.30 cm/s                             SHUNTS  Systemic VTI:  0.16 m                            Systemic Diam: 2.50 cm                            Pulmonic Diam: 4.10 cm Donato Schultz MD Electronically signed by Donato Schultz MD Signature Date/Time: 12/08/2022/12:23:14 PM    Final    DG Chest Port 1 View  Result Date: 12/07/2022 CLINICAL DATA:  Shortness of breath. EXAM: PORTABLE CHEST 1 VIEW COMPARISON:  July 31, 2022. FINDINGS: Stable cardiomegaly.  Lungs are clear.  Bony thorax is unremarkable. IMPRESSION: No active disease. Electronically Signed   By: Lupita Raider M.D.   On: 12/07/2022 17:16    Cardiac Studies     Patient Profile     67 y.o. male wit history of chronic diastolic heart failure, pulmonary hypertension, paroxysmal atrial fibrillation, PE, hypertension who we are consulted for evaluation of heart failure  Assessment & Plan    Acute on chronic diastolic heart failure/RV failure: Echocardiogram shows EF 50 to 55%, severe RV dysfunction.  Significantly volume overloaded on exam -Continue IV Lasix -Strict I's/O's and daily weights -Plan to add SGLT2 inhibitor prior to discharge  Atrial fibrillation: Likely permanent.  Continue Eliquis for anticoagulation.  Continue carvedilol, will rate controlled  AKI: Creatinine 1.65 upon admission.  Has improved with diuresis, Cr now 1.19. Likely cardiorenal syndrome   For questions or updates, please contact [facility] Please consult [email or web address] for contact info under        Signed, Little Ishikawa, MD  12/09/2022, 10:07 AM

## 2022-12-10 DIAGNOSIS — N179 Acute kidney failure, unspecified: Secondary | ICD-10-CM | POA: Diagnosis not present

## 2022-12-10 DIAGNOSIS — I4821 Permanent atrial fibrillation: Secondary | ICD-10-CM | POA: Diagnosis not present

## 2022-12-10 DIAGNOSIS — I50810 Right heart failure, unspecified: Secondary | ICD-10-CM | POA: Diagnosis not present

## 2022-12-10 DIAGNOSIS — I5033 Acute on chronic diastolic (congestive) heart failure: Secondary | ICD-10-CM | POA: Diagnosis not present

## 2022-12-10 LAB — CBC
HCT: 45.4 % (ref 39.0–52.0)
Hemoglobin: 13.7 g/dL (ref 13.0–17.0)
MCH: 30.1 pg (ref 26.0–34.0)
MCHC: 30.2 g/dL (ref 30.0–36.0)
MCV: 99.8 fL (ref 80.0–100.0)
Platelets: 109 10*3/uL — ABNORMAL LOW (ref 150–400)
RBC: 4.55 MIL/uL (ref 4.22–5.81)
RDW: 13.7 % (ref 11.5–15.5)
WBC: 5 10*3/uL (ref 4.0–10.5)
nRBC: 0 % (ref 0.0–0.2)

## 2022-12-10 LAB — BASIC METABOLIC PANEL
Anion gap: 8 (ref 5–15)
BUN: 16 mg/dL (ref 8–23)
CO2: 41 mmol/L — ABNORMAL HIGH (ref 22–32)
Calcium: 8.7 mg/dL — ABNORMAL LOW (ref 8.9–10.3)
Chloride: 90 mmol/L — ABNORMAL LOW (ref 98–111)
Creatinine, Ser: 1.35 mg/dL — ABNORMAL HIGH (ref 0.61–1.24)
GFR, Estimated: 57 mL/min — ABNORMAL LOW (ref >60)
Glucose, Bld: 111 mg/dL — ABNORMAL HIGH (ref 70–99)
Potassium: 4.4 mmol/L (ref 3.5–5.1)
Sodium: 139 mmol/L (ref 135–145)

## 2022-12-10 LAB — MAGNESIUM: Magnesium: 1.8 mg/dL (ref 1.7–2.4)

## 2022-12-10 MED ORDER — TORSEMIDE 20 MG PO TABS
20.0000 mg | ORAL_TABLET | Freq: Every day | ORAL | Status: DC
  Administered 2022-12-10 – 2022-12-11 (×2): 20 mg via ORAL
  Filled 2022-12-10 (×2): qty 1

## 2022-12-10 MED ORDER — DAPAGLIFLOZIN PROPANEDIOL 10 MG PO TABS
10.0000 mg | ORAL_TABLET | Freq: Every day | ORAL | Status: DC
  Administered 2022-12-10 – 2022-12-11 (×2): 10 mg via ORAL
  Filled 2022-12-10 (×2): qty 1

## 2022-12-10 NOTE — Progress Notes (Addendum)
FV Distal               Yes      No                                       +---------+---------------+---------+-----------+----------+-------------------+ PFV      Full                                                             +---------+---------------+---------+-----------+----------+-------------------+ POP      Full           Yes      No                                       +---------+---------------+---------+-----------+----------+-------------------+ PTV      Full                                                             +---------+---------------+---------+-----------+----------+-------------------+ PERO                                                   Not well visualized +---------+---------------+---------+-----------+----------+-------------------+     Summary: RIGHT: - There is no evidence of deep vein thrombosis in the lower extremity. However, portions of this examination were limited- see technologist comments above.  - No cystic structure found in the popliteal fossa.  LEFT: - There is no evidence of deep vein thrombosis in the lower extremity. However, portions of this examination were limited- see technologist comments above.  - No cystic structure found in the popliteal fossa.  *See table(s) above for measurements and observations. Electronically signed by Coral Else MD on 12/09/2022 at 6:24:30 PM.    Final    ECHOCARDIOGRAM COMPLETE  Result Date: 12/08/2022    ECHOCARDIOGRAM REPORT   Patient Name:   Dennis Swanson Date of Exam: 12/08/2022 Medical Rec #:  [id number]           Height:       72.0 in Accession #:    [id number]          Weight:       276.7 lb Date of Birth:  November 17, 1955            BSA:          2.446 m Patient Age:    67 years            BP:           115/82 mmHg Patient Gender: M                   HR:           80 bpm. Exam Location:  Inpatient Procedure: 2D Echo, 3D Echo, Cardiac Doppler and Color Doppler Indications:  FV Distal               Yes      No                                       +---------+---------------+---------+-----------+----------+-------------------+ PFV      Full                                                             +---------+---------------+---------+-----------+----------+-------------------+ POP      Full           Yes      No                                       +---------+---------------+---------+-----------+----------+-------------------+ PTV      Full                                                             +---------+---------------+---------+-----------+----------+-------------------+ PERO                                                   Not well visualized +---------+---------------+---------+-----------+----------+-------------------+     Summary: RIGHT: - There is no evidence of deep vein thrombosis in the lower extremity. However, portions of this examination were limited- see technologist comments above.  - No cystic structure found in the popliteal fossa.  LEFT: - There is no evidence of deep vein thrombosis in the lower extremity. However, portions of this examination were limited- see technologist comments above.  - No cystic structure found in the popliteal fossa.  *See table(s) above for measurements and observations. Electronically signed by Coral Else MD on 12/09/2022 at 6:24:30 PM.    Final    ECHOCARDIOGRAM COMPLETE  Result Date: 12/08/2022    ECHOCARDIOGRAM REPORT   Patient Name:   Dennis Swanson Date of Exam: 12/08/2022 Medical Rec #:  [id number]           Height:       72.0 in Accession #:    [id number]          Weight:       276.7 lb Date of Birth:  November 17, 1955            BSA:          2.446 m Patient Age:    67 years            BP:           115/82 mmHg Patient Gender: M                   HR:           80 bpm. Exam Location:  Inpatient Procedure: 2D Echo, 3D Echo, Cardiac Doppler and Color Doppler Indications:  FV Distal               Yes      No                                       +---------+---------------+---------+-----------+----------+-------------------+ PFV      Full                                                             +---------+---------------+---------+-----------+----------+-------------------+ POP      Full           Yes      No                                       +---------+---------------+---------+-----------+----------+-------------------+ PTV      Full                                                             +---------+---------------+---------+-----------+----------+-------------------+ PERO                                                   Not well visualized +---------+---------------+---------+-----------+----------+-------------------+     Summary: RIGHT: - There is no evidence of deep vein thrombosis in the lower extremity. However, portions of this examination were limited- see technologist comments above.  - No cystic structure found in the popliteal fossa.  LEFT: - There is no evidence of deep vein thrombosis in the lower extremity. However, portions of this examination were limited- see technologist comments above.  - No cystic structure found in the popliteal fossa.  *See table(s) above for measurements and observations. Electronically signed by Coral Else MD on 12/09/2022 at 6:24:30 PM.    Final    ECHOCARDIOGRAM COMPLETE  Result Date: 12/08/2022    ECHOCARDIOGRAM REPORT   Patient Name:   Dennis Swanson Date of Exam: 12/08/2022 Medical Rec #:  [id number]           Height:       72.0 in Accession #:    [id number]          Weight:       276.7 lb Date of Birth:  November 17, 1955            BSA:          2.446 m Patient Age:    67 years            BP:           115/82 mmHg Patient Gender: M                   HR:           80 bpm. Exam Location:  Inpatient Procedure: 2D Echo, 3D Echo, Cardiac Doppler and Color Doppler Indications:  AST 20  --   --   --   --   --   ALT 20  --   --   --   --   --   ALKPHOS 45  --   --   --   --   --   BILITOT 0.8  --   --   --   --   --   GFRNONAA 45*  --  56* >60  --  57*  ANIONGAP 8  --  14 12  --  8   < > = values in this interval not displayed.    Lipids No results for input(s): "CHOL", "TRIG", "HDL", "LABVLDL", "LDLCALC", "CHOLHDL" in the last 168 hours.  Hematology Recent Labs  Lab 12/08/22 0914 12/09/22 0328 12/10/22 0409  WBC 4.3 4.9 5.0  RBC 4.92 5.04 4.55  HGB 14.9 15.2 13.7  HCT 49.4 51.1 45.4  MCV 100.4* 101.4* 99.8  MCH 30.3 30.2 30.1  MCHC 30.2 29.7* 30.2  RDW 14.3 14.1 13.7  PLT 99* 105* 109*   Thyroid No results for input(s): "TSH", "FREET4" in the last 168 hours.  BNP Recent Labs  Lab 12/07/22 1836  BNP 533.6*    DDimer No results for input(s): "DDIMER" in the last 168 hours.   Radiology    VAS [place] LOWER EXTREMITY VENOUS (DVT)  Result Date: 12/09/2022  Lower Venous DVT Study Patient Name:  Dennis Swanson  Date of Exam:   12/09/2022 Medical Rec #: [id number]            Accession #:    [id number] Date of Birth: Nov 20, 1955             Patient Gender: M Patient Age:   68 years Exam Location:  [facility] Procedure:      VAS [place] LOWER EXTREMITY VENOUS (DVT) Referring Phys: ANAND HONGALGI  --------------------------------------------------------------------------------  Indications: Swelling.  Risk Factors: None identified. Limitations: Body habitus and poor ultrasound/tissue interface. Comparison Study: No prior studies. Performing Technologist: Chanda Busing RVT  Examination Guidelines: A complete evaluation includes B-mode imaging, spectral Doppler, color Doppler, and power Doppler as needed of all accessible portions of each vessel. Bilateral testing is considered an integral part of a complete examination. Limited examinations for reoccurring indications may be performed as noted. The reflux portion of the exam is performed with the patient in reverse Trendelenburg.  +---------+---------------+---------+-----------+----------+--------------+ RIGHT    CompressibilityPhasicitySpontaneityPropertiesThrombus Aging +---------+---------------+---------+-----------+----------+--------------+ CFV      Full           Yes      No                                  +---------+---------------+---------+-----------+----------+--------------+ SFJ      Full                                                        +---------+---------------+---------+-----------+----------+--------------+ FV Prox  Full                                                        +---------+---------------+---------+-----------+----------+--------------+  AST 20  --   --   --   --   --   ALT 20  --   --   --   --   --   ALKPHOS 45  --   --   --   --   --   BILITOT 0.8  --   --   --   --   --   GFRNONAA 45*  --  56* >60  --  57*  ANIONGAP 8  --  14 12  --  8   < > = values in this interval not displayed.    Lipids No results for input(s): "CHOL", "TRIG", "HDL", "LABVLDL", "LDLCALC", "CHOLHDL" in the last 168 hours.  Hematology Recent Labs  Lab 12/08/22 0914 12/09/22 0328 12/10/22 0409  WBC 4.3 4.9 5.0  RBC 4.92 5.04 4.55  HGB 14.9 15.2 13.7  HCT 49.4 51.1 45.4  MCV 100.4* 101.4* 99.8  MCH 30.3 30.2 30.1  MCHC 30.2 29.7* 30.2  RDW 14.3 14.1 13.7  PLT 99* 105* 109*   Thyroid No results for input(s): "TSH", "FREET4" in the last 168 hours.  BNP Recent Labs  Lab 12/07/22 1836  BNP 533.6*    DDimer No results for input(s): "DDIMER" in the last 168 hours.   Radiology    VAS [place] LOWER EXTREMITY VENOUS (DVT)  Result Date: 12/09/2022  Lower Venous DVT Study Patient Name:  Dennis Swanson  Date of Exam:   12/09/2022 Medical Rec #: [id number]            Accession #:    [id number] Date of Birth: Nov 20, 1955             Patient Gender: M Patient Age:   68 years Exam Location:  [facility] Procedure:      VAS [place] LOWER EXTREMITY VENOUS (DVT) Referring Phys: ANAND HONGALGI  --------------------------------------------------------------------------------  Indications: Swelling.  Risk Factors: None identified. Limitations: Body habitus and poor ultrasound/tissue interface. Comparison Study: No prior studies. Performing Technologist: Chanda Busing RVT  Examination Guidelines: A complete evaluation includes B-mode imaging, spectral Doppler, color Doppler, and power Doppler as needed of all accessible portions of each vessel. Bilateral testing is considered an integral part of a complete examination. Limited examinations for reoccurring indications may be performed as noted. The reflux portion of the exam is performed with the patient in reverse Trendelenburg.  +---------+---------------+---------+-----------+----------+--------------+ RIGHT    CompressibilityPhasicitySpontaneityPropertiesThrombus Aging +---------+---------------+---------+-----------+----------+--------------+ CFV      Full           Yes      No                                  +---------+---------------+---------+-----------+----------+--------------+ SFJ      Full                                                        +---------+---------------+---------+-----------+----------+--------------+ FV Prox  Full                                                        +---------+---------------+---------+-----------+----------+--------------+  AST 20  --   --   --   --   --   ALT 20  --   --   --   --   --   ALKPHOS 45  --   --   --   --   --   BILITOT 0.8  --   --   --   --   --   GFRNONAA 45*  --  56* >60  --  57*  ANIONGAP 8  --  14 12  --  8   < > = values in this interval not displayed.    Lipids No results for input(s): "CHOL", "TRIG", "HDL", "LABVLDL", "LDLCALC", "CHOLHDL" in the last 168 hours.  Hematology Recent Labs  Lab 12/08/22 0914 12/09/22 0328 12/10/22 0409  WBC 4.3 4.9 5.0  RBC 4.92 5.04 4.55  HGB 14.9 15.2 13.7  HCT 49.4 51.1 45.4  MCV 100.4* 101.4* 99.8  MCH 30.3 30.2 30.1  MCHC 30.2 29.7* 30.2  RDW 14.3 14.1 13.7  PLT 99* 105* 109*   Thyroid No results for input(s): "TSH", "FREET4" in the last 168 hours.  BNP Recent Labs  Lab 12/07/22 1836  BNP 533.6*    DDimer No results for input(s): "DDIMER" in the last 168 hours.   Radiology    VAS [place] LOWER EXTREMITY VENOUS (DVT)  Result Date: 12/09/2022  Lower Venous DVT Study Patient Name:  Dennis Swanson  Date of Exam:   12/09/2022 Medical Rec #: [id number]            Accession #:    [id number] Date of Birth: Nov 20, 1955             Patient Gender: M Patient Age:   68 years Exam Location:  [facility] Procedure:      VAS [place] LOWER EXTREMITY VENOUS (DVT) Referring Phys: ANAND HONGALGI  --------------------------------------------------------------------------------  Indications: Swelling.  Risk Factors: None identified. Limitations: Body habitus and poor ultrasound/tissue interface. Comparison Study: No prior studies. Performing Technologist: Chanda Busing RVT  Examination Guidelines: A complete evaluation includes B-mode imaging, spectral Doppler, color Doppler, and power Doppler as needed of all accessible portions of each vessel. Bilateral testing is considered an integral part of a complete examination. Limited examinations for reoccurring indications may be performed as noted. The reflux portion of the exam is performed with the patient in reverse Trendelenburg.  +---------+---------------+---------+-----------+----------+--------------+ RIGHT    CompressibilityPhasicitySpontaneityPropertiesThrombus Aging +---------+---------------+---------+-----------+----------+--------------+ CFV      Full           Yes      No                                  +---------+---------------+---------+-----------+----------+--------------+ SFJ      Full                                                        +---------+---------------+---------+-----------+----------+--------------+ FV Prox  Full                                                        +---------+---------------+---------+-----------+----------+--------------+  AST 20  --   --   --   --   --   ALT 20  --   --   --   --   --   ALKPHOS 45  --   --   --   --   --   BILITOT 0.8  --   --   --   --   --   GFRNONAA 45*  --  56* >60  --  57*  ANIONGAP 8  --  14 12  --  8   < > = values in this interval not displayed.    Lipids No results for input(s): "CHOL", "TRIG", "HDL", "LABVLDL", "LDLCALC", "CHOLHDL" in the last 168 hours.  Hematology Recent Labs  Lab 12/08/22 0914 12/09/22 0328 12/10/22 0409  WBC 4.3 4.9 5.0  RBC 4.92 5.04 4.55  HGB 14.9 15.2 13.7  HCT 49.4 51.1 45.4  MCV 100.4* 101.4* 99.8  MCH 30.3 30.2 30.1  MCHC 30.2 29.7* 30.2  RDW 14.3 14.1 13.7  PLT 99* 105* 109*   Thyroid No results for input(s): "TSH", "FREET4" in the last 168 hours.  BNP Recent Labs  Lab 12/07/22 1836  BNP 533.6*    DDimer No results for input(s): "DDIMER" in the last 168 hours.   Radiology    VAS [place] LOWER EXTREMITY VENOUS (DVT)  Result Date: 12/09/2022  Lower Venous DVT Study Patient Name:  Dennis Swanson  Date of Exam:   12/09/2022 Medical Rec #: [id number]            Accession #:    [id number] Date of Birth: Nov 20, 1955             Patient Gender: M Patient Age:   68 years Exam Location:  [facility] Procedure:      VAS [place] LOWER EXTREMITY VENOUS (DVT) Referring Phys: ANAND HONGALGI  --------------------------------------------------------------------------------  Indications: Swelling.  Risk Factors: None identified. Limitations: Body habitus and poor ultrasound/tissue interface. Comparison Study: No prior studies. Performing Technologist: Chanda Busing RVT  Examination Guidelines: A complete evaluation includes B-mode imaging, spectral Doppler, color Doppler, and power Doppler as needed of all accessible portions of each vessel. Bilateral testing is considered an integral part of a complete examination. Limited examinations for reoccurring indications may be performed as noted. The reflux portion of the exam is performed with the patient in reverse Trendelenburg.  +---------+---------------+---------+-----------+----------+--------------+ RIGHT    CompressibilityPhasicitySpontaneityPropertiesThrombus Aging +---------+---------------+---------+-----------+----------+--------------+ CFV      Full           Yes      No                                  +---------+---------------+---------+-----------+----------+--------------+ SFJ      Full                                                        +---------+---------------+---------+-----------+----------+--------------+ FV Prox  Full                                                        +---------+---------------+---------+-----------+----------+--------------+

## 2022-12-10 NOTE — Progress Notes (Signed)
Mobility Specialist Progress Note:   12/10/22 1047  Mobility  Activity Ambulated with assistance in hallway  Level of Assistance Standby assist, set-up cues, supervision of patient - no hands on  Assistive Device None  Distance Ambulated (ft) 450 ft  Activity Response Tolerated well  Mobility Referral Yes  $Mobility charge 1 Mobility  Mobility Specialist Start Time (ACUTE ONLY) 1009  Mobility Specialist Stop Time (ACUTE ONLY) 1019  Mobility Specialist Time Calculation (min) (ACUTE ONLY) 10 min   Pre Mobility: 82 HR , 79% SpO2 RA During Mobility: 99 HR , 84%-95% SpO2 RA- 2 L Post Mobility: 86 HR , 100% SpO2 2 L  Pt received in chair, agreeable to mobility. Found off oxygen with Spo2 levels in high 70's. Pt c/o slight SOB but denied any lightheadedness. SpO2 peaked at 84% on RA during ambulation. Pt needing 2 L for SpO2 >90% during ambulation. Pt denied any other discomfort, asymptomatic throughout. Pt returned to chair with call bell in reach and all needs met. RN notified.     Leory Plowman  Mobility Specialist Please contact via [employer] office at [phone]

## 2022-12-10 NOTE — Progress Notes (Signed)
21 U.S.C. section 360bbb-3(b)(1), unless the authorization is terminated or revoked.     Resp Syncytial Virus by PCR NEGATIVE NEGATIVE Final    Comment: (NOTE) Fact Sheet for Patients: BloggerCourse.com  Fact Sheet for Healthcare Providers: SeriousBroker.it  This test is not yet approved or cleared by the [place] FDA and has been authorized for detection and/or diagnosis of SARS-CoV-2 by FDA under an Emergency Use Authorization (EUA). This EUA will remain in effect (meaning this test can be used) for the duration of the COVID-19 declaration under Section 564(b)(1) of the Act, 21 U.S.C. section 360bbb-3(b)(1), unless the authorization is terminated or revoked.  Performed at [facility], [address], [place], [place] [address]     Radiology Studies:  VAS [place] LOWER EXTREMITY VENOUS (DVT)  Result Date: 12/09/2022  Lower Venous DVT Study Patient Name:  Dennis Swanson  Date of Exam:   12/09/2022 Medical Rec #: [id number]            Accession #:    [id number] Date of Birth: 01-09-1956             Patient Gender: M Patient Age:   67 years Exam Location:  [facility] Procedure:      VAS [place] LOWER EXTREMITY VENOUS (DVT) Referring Phys: ANAND HONGALGI --------------------------------------------------------------------------------  Indications: Swelling.  Risk Factors: None identified. Limitations: Body habitus and poor ultrasound/tissue interface. Comparison Study: No prior studies. Performing Technologist: Chanda Busing RVT  Examination Guidelines: A complete evaluation includes B-mode imaging, spectral Doppler, color Doppler, and power Doppler as needed of all accessible portions of each vessel. Bilateral testing is considered an integral part of a complete examination. Limited examinations for reoccurring indications may be performed as noted. The reflux portion of the exam is performed with the patient in reverse Trendelenburg.  +---------+---------------+---------+-----------+----------+--------------+ RIGHT    CompressibilityPhasicitySpontaneityPropertiesThrombus Aging +---------+---------------+---------+-----------+----------+--------------+ CFV      Full           Yes      No                                  +---------+---------------+---------+-----------+----------+--------------+ SFJ      Full                                                        +---------+---------------+---------+-----------+----------+--------------+ FV Prox  Full                                                        +---------+---------------+---------+-----------+----------+--------------+ FV Mid   Full                                                        +---------+---------------+---------+-----------+----------+--------------+ FV DistalFull                                                         +---------+---------------+---------+-----------+----------+--------------+  21 U.S.C. section 360bbb-3(b)(1), unless the authorization is terminated or revoked.     Resp Syncytial Virus by PCR NEGATIVE NEGATIVE Final    Comment: (NOTE) Fact Sheet for Patients: BloggerCourse.com  Fact Sheet for Healthcare Providers: SeriousBroker.it  This test is not yet approved or cleared by the [place] FDA and has been authorized for detection and/or diagnosis of SARS-CoV-2 by FDA under an Emergency Use Authorization (EUA). This EUA will remain in effect (meaning this test can be used) for the duration of the COVID-19 declaration under Section 564(b)(1) of the Act, 21 U.S.C. section 360bbb-3(b)(1), unless the authorization is terminated or revoked.  Performed at [facility], [address], [place], [place] [address]     Radiology Studies:  VAS [place] LOWER EXTREMITY VENOUS (DVT)  Result Date: 12/09/2022  Lower Venous DVT Study Patient Name:  Dennis Swanson  Date of Exam:   12/09/2022 Medical Rec #: [id number]            Accession #:    [id number] Date of Birth: 01-09-1956             Patient Gender: M Patient Age:   67 years Exam Location:  [facility] Procedure:      VAS [place] LOWER EXTREMITY VENOUS (DVT) Referring Phys: ANAND HONGALGI --------------------------------------------------------------------------------  Indications: Swelling.  Risk Factors: None identified. Limitations: Body habitus and poor ultrasound/tissue interface. Comparison Study: No prior studies. Performing Technologist: Chanda Busing RVT  Examination Guidelines: A complete evaluation includes B-mode imaging, spectral Doppler, color Doppler, and power Doppler as needed of all accessible portions of each vessel. Bilateral testing is considered an integral part of a complete examination. Limited examinations for reoccurring indications may be performed as noted. The reflux portion of the exam is performed with the patient in reverse Trendelenburg.  +---------+---------------+---------+-----------+----------+--------------+ RIGHT    CompressibilityPhasicitySpontaneityPropertiesThrombus Aging +---------+---------------+---------+-----------+----------+--------------+ CFV      Full           Yes      No                                  +---------+---------------+---------+-----------+----------+--------------+ SFJ      Full                                                        +---------+---------------+---------+-----------+----------+--------------+ FV Prox  Full                                                        +---------+---------------+---------+-----------+----------+--------------+ FV Mid   Full                                                        +---------+---------------+---------+-----------+----------+--------------+ FV DistalFull                                                         +---------+---------------+---------+-----------+----------+--------------+  21 U.S.C. section 360bbb-3(b)(1), unless the authorization is terminated or revoked.     Resp Syncytial Virus by PCR NEGATIVE NEGATIVE Final    Comment: (NOTE) Fact Sheet for Patients: BloggerCourse.com  Fact Sheet for Healthcare Providers: SeriousBroker.it  This test is not yet approved or cleared by the [place] FDA and has been authorized for detection and/or diagnosis of SARS-CoV-2 by FDA under an Emergency Use Authorization (EUA). This EUA will remain in effect (meaning this test can be used) for the duration of the COVID-19 declaration under Section 564(b)(1) of the Act, 21 U.S.C. section 360bbb-3(b)(1), unless the authorization is terminated or revoked.  Performed at [facility], [address], [place], [place] [address]     Radiology Studies:  VAS [place] LOWER EXTREMITY VENOUS (DVT)  Result Date: 12/09/2022  Lower Venous DVT Study Patient Name:  Dennis Swanson  Date of Exam:   12/09/2022 Medical Rec #: [id number]            Accession #:    [id number] Date of Birth: 01-09-1956             Patient Gender: M Patient Age:   67 years Exam Location:  [facility] Procedure:      VAS [place] LOWER EXTREMITY VENOUS (DVT) Referring Phys: ANAND HONGALGI --------------------------------------------------------------------------------  Indications: Swelling.  Risk Factors: None identified. Limitations: Body habitus and poor ultrasound/tissue interface. Comparison Study: No prior studies. Performing Technologist: Chanda Busing RVT  Examination Guidelines: A complete evaluation includes B-mode imaging, spectral Doppler, color Doppler, and power Doppler as needed of all accessible portions of each vessel. Bilateral testing is considered an integral part of a complete examination. Limited examinations for reoccurring indications may be performed as noted. The reflux portion of the exam is performed with the patient in reverse Trendelenburg.  +---------+---------------+---------+-----------+----------+--------------+ RIGHT    CompressibilityPhasicitySpontaneityPropertiesThrombus Aging +---------+---------------+---------+-----------+----------+--------------+ CFV      Full           Yes      No                                  +---------+---------------+---------+-----------+----------+--------------+ SFJ      Full                                                        +---------+---------------+---------+-----------+----------+--------------+ FV Prox  Full                                                        +---------+---------------+---------+-----------+----------+--------------+ FV Mid   Full                                                        +---------+---------------+---------+-----------+----------+--------------+ FV DistalFull                                                         +---------+---------------+---------+-----------+----------+--------------+  PROGRESS NOTE   Dennis Swanson  ZOX:[id number]    DOB: 05/01/1955    DOA: 12/07/2022  PCP: Kirstie Peri, MD     Brief Hospital Course:  67 year old male, lives alone, independent, medical history significant for chronic systolic CHF, A-fib, pulmonary embolism on Eliquis, HTN, HLD, obesity, chronic iron deficiency anemia, alcohol use disorder, who presented to the ED on 12/07/2022 with complaints of progressive dyspnea on exertion with minimal activity, progressive lower extremity edema, weight gain possibly up to 30 pounds, abdominal distention but no chest pain.  Reports dietary indiscretion and eating salty food and consuming alcohol.  In the ED, oxygen saturations in the 79% on room air, required 6 L/min [place] oxygen to bring up saturations to 92%.  Overnight of admission, required BiPAP for acute on chronic hypoxic and hypercapnic respiratory failure.  Admitted for acute on chronic diastolic CHF, acute on chronic hypoxic and hypercapnic respiratory failure.   Assessment & Plan:   Acute on suspected chronic hypoxic and hypercapnic respiratory failure Multifactorial due to decompensated CHF complicating underlying suspected OSA/OHS, ?? COPD/asthma. VBG on admission: pH 7.26, pCO2 111, pO2 <31. Presented with oxygen saturation of 79% on room air on admission. Patient required BiPAP in the night of admission but then transitioned to nasal cannula. Has seen pulmonology in July 2024, assessed to have biventricular heart failure, remote PE, negative collagen studies, non-smoker, planned for home sleep testing.  Will allow this to be pursued further in the outpatient setting.  Will need evaluation for home oxygen prior to discharge. Respiratory status is stable for the most part.  Acute on chronic diastolic CHF Echocardiogram done during this hospital stay shows LVEF of 50 to 55%.  Moderate LVH was noted.  Previously he has had a EF as low as 40 to 45%. HS Troponin 31 > 30, minimally elevated,  flat trend, no chest pain, suspect demand ischemia. Continues to have pedal edema.  Remains on IV furosemide.  Strict ins and outs and daily weights. Cardiology is also following.  Mobilize.  A-fib with controlled ventricular rate: Continue carvedilol and apixaban.  Acute kidney injury complicating stage II CKD/hyperkalemia Last known creatinine 1.17 in May 23.   Presented with creatinine of 1.65,?  Cardiorenal syndrome Renal function stable for the most part.  Monitor closely while he is getting diuresed. Aldactone on hold due to hyperkalemia.  Potassium level has improved.  Hypernatremia, mild Sodium level has improved  Thrombocytopenia Stable.  Essential hypertension Controlled on carvedilol alone for now. Holding Aldactone and Entresto due to AKI and hyperkalemia Blood pressure has been borderline low.  Dose of carvedilol was decreased yesterday.  Blood pressures have stabilized.  Continue to monitor for now.  History of pulmonary embolism Continue Eliquis.  No DVT on Doppler studies.  Alcohol use disorder Does not appear to be forthcoming about the exact amount of alcohol he consumes. CIWA protocol. No evidence for withdrawal currently.  Suspected sleep apnea Outpatient follow-up with pulmonology for sleep study. CPAP at bedtime  Aortic dilatation Noted on echocardiogram.  Outpatient monitoring.  Pulmonary hypertension Noted on echocardiogram.  RV function is reduced.  Seen on previous echo as well.  Outpatient follow-up with pulmonology.  Chronic pain Continue prior home dose of opioids.  Avoid escalation.  Alcohol use,?  Disorder CIWA protocol.  Body mass index is 36.78 kg/m./Obesity Lifestyle modifications and weight loss as outpatient.   DVT prophylaxis:   On apixaban anticoagulation   Code Status: Full Code:  Family Communication: None at bedside Disposition:  PROGRESS NOTE   Dennis Swanson  ZOX:[id number]    DOB: 05/01/1955    DOA: 12/07/2022  PCP: Kirstie Peri, MD     Brief Hospital Course:  67 year old male, lives alone, independent, medical history significant for chronic systolic CHF, A-fib, pulmonary embolism on Eliquis, HTN, HLD, obesity, chronic iron deficiency anemia, alcohol use disorder, who presented to the ED on 12/07/2022 with complaints of progressive dyspnea on exertion with minimal activity, progressive lower extremity edema, weight gain possibly up to 30 pounds, abdominal distention but no chest pain.  Reports dietary indiscretion and eating salty food and consuming alcohol.  In the ED, oxygen saturations in the 79% on room air, required 6 L/min [place] oxygen to bring up saturations to 92%.  Overnight of admission, required BiPAP for acute on chronic hypoxic and hypercapnic respiratory failure.  Admitted for acute on chronic diastolic CHF, acute on chronic hypoxic and hypercapnic respiratory failure.   Assessment & Plan:   Acute on suspected chronic hypoxic and hypercapnic respiratory failure Multifactorial due to decompensated CHF complicating underlying suspected OSA/OHS, ?? COPD/asthma. VBG on admission: pH 7.26, pCO2 111, pO2 <31. Presented with oxygen saturation of 79% on room air on admission. Patient required BiPAP in the night of admission but then transitioned to nasal cannula. Has seen pulmonology in July 2024, assessed to have biventricular heart failure, remote PE, negative collagen studies, non-smoker, planned for home sleep testing.  Will allow this to be pursued further in the outpatient setting.  Will need evaluation for home oxygen prior to discharge. Respiratory status is stable for the most part.  Acute on chronic diastolic CHF Echocardiogram done during this hospital stay shows LVEF of 50 to 55%.  Moderate LVH was noted.  Previously he has had a EF as low as 40 to 45%. HS Troponin 31 > 30, minimally elevated,  flat trend, no chest pain, suspect demand ischemia. Continues to have pedal edema.  Remains on IV furosemide.  Strict ins and outs and daily weights. Cardiology is also following.  Mobilize.  A-fib with controlled ventricular rate: Continue carvedilol and apixaban.  Acute kidney injury complicating stage II CKD/hyperkalemia Last known creatinine 1.17 in May 23.   Presented with creatinine of 1.65,?  Cardiorenal syndrome Renal function stable for the most part.  Monitor closely while he is getting diuresed. Aldactone on hold due to hyperkalemia.  Potassium level has improved.  Hypernatremia, mild Sodium level has improved  Thrombocytopenia Stable.  Essential hypertension Controlled on carvedilol alone for now. Holding Aldactone and Entresto due to AKI and hyperkalemia Blood pressure has been borderline low.  Dose of carvedilol was decreased yesterday.  Blood pressures have stabilized.  Continue to monitor for now.  History of pulmonary embolism Continue Eliquis.  No DVT on Doppler studies.  Alcohol use disorder Does not appear to be forthcoming about the exact amount of alcohol he consumes. CIWA protocol. No evidence for withdrawal currently.  Suspected sleep apnea Outpatient follow-up with pulmonology for sleep study. CPAP at bedtime  Aortic dilatation Noted on echocardiogram.  Outpatient monitoring.  Pulmonary hypertension Noted on echocardiogram.  RV function is reduced.  Seen on previous echo as well.  Outpatient follow-up with pulmonology.  Chronic pain Continue prior home dose of opioids.  Avoid escalation.  Alcohol use,?  Disorder CIWA protocol.  Body mass index is 36.78 kg/m./Obesity Lifestyle modifications and weight loss as outpatient.   DVT prophylaxis:   On apixaban anticoagulation   Code Status: Full Code:  Family Communication: None at bedside Disposition:  POP      Full           Yes      No                                        +---------+---------------+---------+-----------+----------+-------------------+ PTV      Full                                                             +---------+---------------+---------+-----------+----------+-------------------+ PERO                                                  Not well visualized +---------+---------------+---------+-----------+----------+-------------------+     Summary: RIGHT: - There is no evidence of deep vein thrombosis in the lower extremity. However, portions of this examination were limited- see technologist comments above.  - No cystic structure found in the popliteal fossa.  LEFT: - There is no evidence of deep vein thrombosis in the lower extremity. However, portions of this examination were limited- see technologist comments above.  - No cystic structure found in the popliteal fossa.  *See table(s) above for measurements and observations. Electronically signed by Coral Else MD on 12/09/2022 at 6:24:30 PM.    Final    ECHOCARDIOGRAM COMPLETE  Result Date: 12/08/2022    ECHOCARDIOGRAM REPORT   Patient Name:   Dennis Swanson Date of Exam: 12/08/2022 Medical Rec #:  [id number]           Height:       72.0 in Accession #:    [id number]          Weight:       276.7 lb Date of Birth:  November 06, 1955            BSA:          2.446 m Patient Age:    67 years            BP:           115/82 mmHg Patient Gender: M                   HR:           80 bpm. Exam Location:  Inpatient Procedure: 2D Echo, 3D Echo, Cardiac Doppler and Color Doppler Indications:    I50.40* Unspecified combined systolic (congestive) and diastolic                 (congestive) heart failure  History:        Patient has prior history of Echocardiogram examinations, most                 recent 01/06/2022. CHF and Cardiomyopathy, Abnormal ECG,                 Arrythmias:Atrial Fibrillation, Signs/Symptoms:Chest Pain; Risk                 Factors:Hypertension. ETOH.  Pulmonary embolus. Right ventricular                 failure.  Sonographer:  PROGRESS NOTE   Dennis Swanson  ZOX:[id number]    DOB: 05/01/1955    DOA: 12/07/2022  PCP: Kirstie Peri, MD     Brief Hospital Course:  67 year old male, lives alone, independent, medical history significant for chronic systolic CHF, A-fib, pulmonary embolism on Eliquis, HTN, HLD, obesity, chronic iron deficiency anemia, alcohol use disorder, who presented to the ED on 12/07/2022 with complaints of progressive dyspnea on exertion with minimal activity, progressive lower extremity edema, weight gain possibly up to 30 pounds, abdominal distention but no chest pain.  Reports dietary indiscretion and eating salty food and consuming alcohol.  In the ED, oxygen saturations in the 79% on room air, required 6 L/min [place] oxygen to bring up saturations to 92%.  Overnight of admission, required BiPAP for acute on chronic hypoxic and hypercapnic respiratory failure.  Admitted for acute on chronic diastolic CHF, acute on chronic hypoxic and hypercapnic respiratory failure.   Assessment & Plan:   Acute on suspected chronic hypoxic and hypercapnic respiratory failure Multifactorial due to decompensated CHF complicating underlying suspected OSA/OHS, ?? COPD/asthma. VBG on admission: pH 7.26, pCO2 111, pO2 <31. Presented with oxygen saturation of 79% on room air on admission. Patient required BiPAP in the night of admission but then transitioned to nasal cannula. Has seen pulmonology in July 2024, assessed to have biventricular heart failure, remote PE, negative collagen studies, non-smoker, planned for home sleep testing.  Will allow this to be pursued further in the outpatient setting.  Will need evaluation for home oxygen prior to discharge. Respiratory status is stable for the most part.  Acute on chronic diastolic CHF Echocardiogram done during this hospital stay shows LVEF of 50 to 55%.  Moderate LVH was noted.  Previously he has had a EF as low as 40 to 45%. HS Troponin 31 > 30, minimally elevated,  flat trend, no chest pain, suspect demand ischemia. Continues to have pedal edema.  Remains on IV furosemide.  Strict ins and outs and daily weights. Cardiology is also following.  Mobilize.  A-fib with controlled ventricular rate: Continue carvedilol and apixaban.  Acute kidney injury complicating stage II CKD/hyperkalemia Last known creatinine 1.17 in May 23.   Presented with creatinine of 1.65,?  Cardiorenal syndrome Renal function stable for the most part.  Monitor closely while he is getting diuresed. Aldactone on hold due to hyperkalemia.  Potassium level has improved.  Hypernatremia, mild Sodium level has improved  Thrombocytopenia Stable.  Essential hypertension Controlled on carvedilol alone for now. Holding Aldactone and Entresto due to AKI and hyperkalemia Blood pressure has been borderline low.  Dose of carvedilol was decreased yesterday.  Blood pressures have stabilized.  Continue to monitor for now.  History of pulmonary embolism Continue Eliquis.  No DVT on Doppler studies.  Alcohol use disorder Does not appear to be forthcoming about the exact amount of alcohol he consumes. CIWA protocol. No evidence for withdrawal currently.  Suspected sleep apnea Outpatient follow-up with pulmonology for sleep study. CPAP at bedtime  Aortic dilatation Noted on echocardiogram.  Outpatient monitoring.  Pulmonary hypertension Noted on echocardiogram.  RV function is reduced.  Seen on previous echo as well.  Outpatient follow-up with pulmonology.  Chronic pain Continue prior home dose of opioids.  Avoid escalation.  Alcohol use,?  Disorder CIWA protocol.  Body mass index is 36.78 kg/m./Obesity Lifestyle modifications and weight loss as outpatient.   DVT prophylaxis:   On apixaban anticoagulation   Code Status: Full Code:  Family Communication: None at bedside Disposition:  POP      Full           Yes      No                                        +---------+---------------+---------+-----------+----------+-------------------+ PTV      Full                                                             +---------+---------------+---------+-----------+----------+-------------------+ PERO                                                  Not well visualized +---------+---------------+---------+-----------+----------+-------------------+     Summary: RIGHT: - There is no evidence of deep vein thrombosis in the lower extremity. However, portions of this examination were limited- see technologist comments above.  - No cystic structure found in the popliteal fossa.  LEFT: - There is no evidence of deep vein thrombosis in the lower extremity. However, portions of this examination were limited- see technologist comments above.  - No cystic structure found in the popliteal fossa.  *See table(s) above for measurements and observations. Electronically signed by Coral Else MD on 12/09/2022 at 6:24:30 PM.    Final    ECHOCARDIOGRAM COMPLETE  Result Date: 12/08/2022    ECHOCARDIOGRAM REPORT   Patient Name:   Dennis Swanson Date of Exam: 12/08/2022 Medical Rec #:  [id number]           Height:       72.0 in Accession #:    [id number]          Weight:       276.7 lb Date of Birth:  November 06, 1955            BSA:          2.446 m Patient Age:    67 years            BP:           115/82 mmHg Patient Gender: M                   HR:           80 bpm. Exam Location:  Inpatient Procedure: 2D Echo, 3D Echo, Cardiac Doppler and Color Doppler Indications:    I50.40* Unspecified combined systolic (congestive) and diastolic                 (congestive) heart failure  History:        Patient has prior history of Echocardiogram examinations, most                 recent 01/06/2022. CHF and Cardiomyopathy, Abnormal ECG,                 Arrythmias:Atrial Fibrillation, Signs/Symptoms:Chest Pain; Risk                 Factors:Hypertension. ETOH.  Pulmonary embolus. Right ventricular                 failure.  Sonographer:

## 2022-12-10 NOTE — Progress Notes (Addendum)
Heart Failure Stewardship Pharmacist Progress Note   PCP: Kirstie Peri, MD PCP-Cardiologist: Dina Rich, MD    HPI:  Dennis Swanson with PMH of chronic biventricular CHF, permanent A-fib, hx of PE (2011), HTN, HLD, obesity, alcohol use disorder, T2DM. Initially presented to ED on 10/21 with progressive DOE with minimal activity, progressive LE edema (weight gain ~30 lbs), abdominal distention. Denied CP. O2 sat down to 79% on RA. Required BiPAP overnight on 10/21 for acute respiratory failure.   HF exacerbation potentially due to dietary excursions - eating salty foods and consuming alcohol. CXR 10/21 demonstrated stable cardiomegaly, clear lungs. Echo 10/22 demonstrated EF 50-55%, LV low-normal function with no RWMA, moderate LVH, indeterminate diastolic parameters, RV function severely reduced, enlarged RV, severely elevated PA pressure. Findings similar to study in Nov 2023, overall improved EF from 45-50% in 2022. Vascular ultrasound 10/23 did not demonstrate evidence of DVT, despite swelling L > R, though study quality was limited. Scr and BNP elevated on admission. Does not appear that pt has ever had ischemic work up.   Upon interview pt states that he is feeling slightly better - still on O2 via [place] (last charted at 2L). Not on O2 at home. Still has bilateral pitting edema with increased swelling in the L lower extremity. Denies dizziness/lightheadedness when moving from laying to sitting and bed to chair. Reports that he was tolerating all his medications very well prior to discharge. Denies missed doses of medications. Organizes his own medications at home. Reports that he occasionally monitors his BP at home and it is usually 120-130s/80s - which is consistent with documented outpatient appt readings. Noted that pt was prescribed Farxiga in 2022 - no AE noted, appears to have fallen off medication list  Reports that he has not had a worsening HF event in several years (appears last HF  admission was 2022). Believes worsening condition may have been exacerbated by eating salty food (specifically catfish).  Current HF Medications: Diuretic: torsemide 20 mg PO daily Beta Blocker: carvedilol 3.125 mg PO BID (decreased from 6.25) SGLT2i: dapagliflozin 10 mg PO daily  Prior to admission HF Medications: Diuretic: furosemide 40 mg PO BID Beta blocker: carvedilol 6.25 mg PO BID ACE/ARB/ARNI: Entresto (sacubitril/valsartan) 24-26 mg PO BID MRA: spironolactone 25 mg PO daily.    Fill hx good for maintenance medications  Pertinent Lab Values: Serum creatinine 1.19 > 1.35 (BL ~1.1), BUN 16, Potassium 4.4, Sodium 139, BNP 533, Trop 31>30, Magnesium 1.8  Vital Signs: Weight: 271.2 lbs (admission weight: 276.68 lbs) - dry weight ~255 lbs (July 2024) Blood pressure: 120-130s/70-90s Heart rate: 80s (NSR vs Afib) I/O: net -0.5L yesterday; net -2.3L since admission - pt reports that I/Os may not be completely accurate  Medication Assistance / Insurance Benefits Check: Does the patient have prescription insurance?  Yes Type of insurance plan: UHC Dual Complete Medicare/Medicaid  Farxiga/Jardiance copay $0/30ds  Does the patient qualify for medication assistance through manufacturers or grants?   No  Outpatient Pharmacy:  Prior to admission outpatient pharmacy: [facility] in [place] Is the patient willing to use [facility] TOC pharmacy at discharge? Yes Is the patient willing to transition their outpatient pharmacy to utilize a [facility] outpatient pharmacy?   No    Assessment: 1. Acute on chronic biventricular CHF (LVEF 50-55%), HFimpEF, unclear etiology. NYHA class IV symptoms. - Pt with residual bilateral pitting edema (L > R), though improved from yesterday. Pt is down ~5lbs since admission. Dry weight is unclear - but Scr did bump today,  so agree with transition to PO diuretic. Should continue to monitor for adequate response.  - Pt still on 2L [place], though not wearing during  interview today - so appears that breathing is improved. Pt states that he did not get to walk around yesterday, so he is unsure whether DOE has improved. - Agree with addition of SGLT2i to assist in diuresis. Pt denies hx of T2DM- appears A1c elevated to 9.6% in 2017, but 5.5% in 2022 without medication. BG wnl this admission.  - BP has increased with cessation of IV diuresis - consider addition of home ARNI or MRA today.   Plan: 1) Medication changes recommended at this time: - Continue carvedilol 3.125 mg PO BID - Continue dapagliflozin (Farxiga) 10 mg PO daily - Continue torsemide 20 mg PO daily - Consider addition of sacubitril-valsartan (Entresto) 24-26 mg PO BID tomorrow pending SCr  2) Patient assistance: - No patient assistance needs noted at this time  3)  Education  - Initial education completed including importance of adherence to maintenance medications and dietary restrictions to prevent fluid accumulation. Patient aware that medications will be adjusted while admitted, and may continue to be adjusted post-discharge. Full education to be completed prior to discharge  Nils Pyle, PharmD PGY1 Pharmacy Resident

## 2022-12-10 NOTE — Progress Notes (Signed)
Physical Therapy Treatment Patient Details Name: Dennis Swanson MRN: [id number] DOB: 29-Dec-1955 Today's Date: 12/10/2022   History of Present Illness Patient is a 67 y/o male who presented on 12/07/22 due to LE swelling and SOB, with 30# weight gain.  Admitted for CHF exacerbation.  PMH positive for PE, ETOH use, a-flutter, CHF, HTN, LE cellulitis and PAF.    PT Comments  Pt received sitting in the recliner and agreeable to session. Pt able to tolerate gait trial in the hallway, however is limited by fatigue due to recent walk with mobility specialist. Pt demonstrates some instability during ambulation without AD and requires cues for pursed lip breathing due to SpO2 drop. Pt requires cues for direction and awareness intermittently. Pt continues to benefit from PT services to progress toward functional mobility goals.     If plan is discharge home, recommend the following: Assistance with cooking/housework;A little help with walking and/or transfers;Help with stairs or ramp for entrance   Can travel by private vehicle        Equipment Recommendations  Other (comment) (possibly cane)    Recommendations for Other Services       Precautions / Restrictions Precautions Precautions: Fall Precaution Comments: denies history of falls Restrictions Weight Bearing Restrictions: No     Mobility  Bed Mobility               General bed mobility comments: Pt in recliner upon arrival    Transfers Overall transfer level: Needs assistance Equipment used: None Transfers: Sit to/from Stand Sit to Stand: Supervision           General transfer comment: supervision for safety and line management    Ambulation/Gait Ambulation/Gait assistance: Supervision Gait Distance (Feet): 115 Feet Assistive device: None Gait Pattern/deviations: Step-through pattern, Decreased stride length, Wide base of support       General Gait Details: slightly increased instability and intermittent  drifting, but no LOB. Some decreased awareness requiring cues.       Balance Overall balance assessment: Needs assistance   Sitting balance-Leahy Scale: Normal     Standing balance support: No upper extremity supported, During functional activity Standing balance-Leahy Scale: Fair Standing balance comment: slight instability without UE support, but no LOB                            Cognition Arousal: Alert Behavior During Therapy: WFL for tasks assessed/performed Overall Cognitive Status: Difficult to assess                                 General Comments: Pt demonstrating some confusion and decreased awareness. Pt had connected O2 line to BP line after returning from the bathroom and attempted to walk to med room thinking it was his room.        Exercises      General Comments General comments (skin integrity, edema, etc.): SpO2 as low as 77% on 2L during ambulation and improving to >87% with increase to 3L and cues for pursed lip breathing. Pt placed back on 2L after gait trial with SpO2 at 90%      Pertinent Vitals/Pain Pain Assessment Pain Assessment: No/denies pain     PT Goals (current goals can now be found in the care plan section) Acute Rehab PT Goals Patient Stated Goal: to return to independent PT Goal Formulation: With patient Time For Goal Achievement: 12/22/22 Progress  towards PT goals: Progressing toward goals    Frequency    Min 1X/week       AM-PAC PT "6 Clicks" Mobility   Outcome Measure  Help needed turning from your back to your side while in a flat bed without using bedrails?: None Help needed moving from lying on your back to sitting on the side of a flat bed without using bedrails?: None Help needed moving to and from a bed to a chair (including a wheelchair)?: A Little Help needed standing up from a chair using your arms (e.g., wheelchair or bedside chair)?: A Little Help needed to walk in hospital room?: A  Little Help needed climbing 3-5 steps with a railing? : A Lot 6 Click Score: 19    End of Session Equipment Utilized During Treatment: Oxygen Activity Tolerance: Patient limited by fatigue Patient left: in chair;with call bell/phone within reach Nurse Communication: Mobility status PT Visit Diagnosis: Other abnormalities of gait and mobility (R26.89);Muscle weakness (generalized) (M62.81)     Time: 2376-2831 PT Time Calculation (min) (ACUTE ONLY): 14 min  Charges:    $Gait Training: 8-22 mins PT General Charges $$ ACUTE PT VISIT: 1 Visit                     Dennis Swanson, PTA Acute Rehabilitation Services Secure Chat Preferred  [phone]    Dennis Swanson 12/10/2022, 1:16 PM

## 2022-12-10 NOTE — Progress Notes (Signed)
Mobility Specialist Progress Note:   12/10/22 1236  Mobility  Activity Ambulated with assistance to bathroom  Level of Assistance Standby assist, set-up cues, supervision of patient - no hands on  Assistive Device None  Distance Ambulated (ft) 15 ft  Activity Response Tolerated well  Mobility Referral Yes  $Mobility charge 1 Mobility  Mobility Specialist Start Time (ACUTE ONLY) 1230  Mobility Specialist Stop Time (ACUTE ONLY) 1235  Mobility Specialist Time Calculation (min) (ACUTE ONLY) 5 min   Pt received standing in room requesting assistance to BR. No hands on needed other than line management. Pt left in BR with NT and RN notified.    Leory Plowman  Mobility Specialist Please contact via [employer] office at [phone]

## 2022-12-10 NOTE — Plan of Care (Signed)

## 2022-12-10 NOTE — Discharge Instructions (Signed)
Information on my medicine - ELIQUIS (apixaban)  This medication education was reviewed with me or my healthcare representative as part of my discharge preparation.    Why was Eliquis prescribed for you? Eliquis was prescribed to treat blood clots that may have been found in the veins of your legs (deep vein thrombosis) or in your lungs (pulmonary embolism) and to reduce the risk of them occurring again.  What do You need to know about Eliquis ? Continue Eliquis 5 mg tablet taken TWICE daily.  Eliquis may be taken with or without food.   Try to take the dose about the same time in the morning and in the evening. If you have difficulty swallowing the tablet whole please discuss with your pharmacist how to take the medication safely.  Take Eliquis exactly as prescribed and DO NOT stop taking Eliquis without talking to the doctor who prescribed the medication.  Stopping may increase your risk of developing a new blood clot.  Refill your prescription before you run out.  After discharge, you should have regular check-up appointments with your healthcare provider that is prescribing your Eliquis.    What do you do if you miss a dose? If a dose of ELIQUIS is not taken at the scheduled time, take it as soon as possible on the same day and twice-daily administration should be resumed. The dose should not be doubled to make up for a missed dose.  Important Safety Information A possible side effect of Eliquis is bleeding. You should call your healthcare provider right away if you experience any of the following: Bleeding from an injury or your nose that does not stop. Unusual colored urine (red or dark brown) or unusual colored stools (red or black). Unusual bruising for unknown reasons. A serious fall or if you hit your head (even if there is no bleeding).  Some medicines may interact with Eliquis and might increase your risk of bleeding or clotting while on Eliquis. To help avoid this,  consult your healthcare provider or pharmacist prior to using any new prescription or non-prescription medications, including herbals, vitamins, non-steroidal anti-inflammatory drugs (NSAIDs) and supplements.  This website has more information on Eliquis (apixaban): http://[email or web address].com/eliquis/home     Follow with Primary MD Kirstie Peri, MD in 7 days   Get CBC, CMP, anemia panel, TSH, 2 view Chest X ray -  checked next visit with your primary MD    Activity: As tolerated with Full fall precautions use walker/cane & assistance as needed  Disposition Home    Diet: Heart Healthy    Special Instructions: If you have smoked or chewed Tobacco  in the last 2 yrs please stop smoking, stop any regular Alcohol  and or any Recreational drug use.  On your next visit with your primary care physician please Get Medicines reviewed and adjusted.  Please request your Prim.MD to go over all Hospital Tests and Procedure/Radiological results at the follow up, please get all Hospital records sent to your Prim MD by signing hospital release before you go home.  If you experience worsening of your admission symptoms, develop shortness of breath, life threatening emergency, suicidal or homicidal thoughts you must seek medical attention immediately by calling 911 or calling your MD immediately  if symptoms less severe.  You Must read complete instructions/literature along with all the possible adverse reactions/side effects for all the Medicines you take and that have been prescribed to you. Take any new Medicines after you have completely understood and accpet all the

## 2022-12-10 NOTE — Progress Notes (Signed)
   12/10/22 0300  BiPAP/CPAP/SIPAP  $ Non-Invasive Ventilator  Non-Invasive Vent Subsequent  BiPAP/CPAP/SIPAP Pt Type Adult  BiPAP/CPAP/SIPAP SERVO  Mask Type Full face mask  Mask Size Medium  Set Rate 15 breaths/min  Respiratory Rate 22 breaths/min  IPAP 15 cmH20  PEEP 5 cmH20  FiO2 (%) 40 %  Minute Ventilation 13.4  Leak 5  Peak Inspiratory Pressure (PIP) 15  Tidal Volume (Vt) 436  Patient Home Equipment No  Press High Alarm 26 cmH2O  BiPAP/CPAP /SiPAP Vitals  Pulse Rate 92  SpO2 98 %  MEWS Score/Color  MEWS Score 0  MEWS Score Color Dennis Swanson

## 2022-12-11 ENCOUNTER — Other Ambulatory Visit (HOSPITAL_COMMUNITY): Payer: Self-pay

## 2022-12-11 DIAGNOSIS — I5033 Acute on chronic diastolic (congestive) heart failure: Secondary | ICD-10-CM | POA: Diagnosis not present

## 2022-12-11 DIAGNOSIS — I50810 Right heart failure, unspecified: Secondary | ICD-10-CM | POA: Diagnosis not present

## 2022-12-11 DIAGNOSIS — I5022 Chronic systolic (congestive) heart failure: Secondary | ICD-10-CM | POA: Diagnosis not present

## 2022-12-11 DIAGNOSIS — I4821 Permanent atrial fibrillation: Secondary | ICD-10-CM | POA: Diagnosis not present

## 2022-12-11 LAB — BASIC METABOLIC PANEL
Anion gap: 8 (ref 5–15)
BUN: 16 mg/dL (ref 8–23)
CO2: 44 mmol/L — ABNORMAL HIGH (ref 22–32)
Calcium: 8.8 mg/dL — ABNORMAL LOW (ref 8.9–10.3)
Chloride: 89 mmol/L — ABNORMAL LOW (ref 98–111)
Creatinine, Ser: 1.35 mg/dL — ABNORMAL HIGH (ref 0.61–1.24)
GFR, Estimated: 57 mL/min — ABNORMAL LOW (ref >60)
Glucose, Bld: 98 mg/dL (ref 70–99)
Potassium: 4.1 mmol/L (ref 3.5–5.1)
Sodium: 141 mmol/L (ref 135–145)

## 2022-12-11 LAB — MAGNESIUM: Magnesium: 1.9 mg/dL (ref 1.7–2.4)

## 2022-12-11 MED ORDER — ACETAZOLAMIDE 250 MG PO TABS
250.0000 mg | ORAL_TABLET | Freq: Once | ORAL | Status: AC
  Administered 2022-12-11: 250 mg via ORAL
  Filled 2022-12-11: qty 1

## 2022-12-11 MED ORDER — DAPAGLIFLOZIN PROPANEDIOL 10 MG PO TABS
10.0000 mg | ORAL_TABLET | Freq: Every day | ORAL | 1 refills | Status: DC
  Filled 2022-12-11: qty 30, 30d supply, fill #0

## 2022-12-11 MED ORDER — THIAMINE HCL 100 MG PO TABS
100.0000 mg | ORAL_TABLET | Freq: Every day | ORAL | 0 refills | Status: DC
  Filled 2022-12-11: qty 30, 30d supply, fill #0

## 2022-12-11 MED ORDER — DAPAGLIFLOZIN PROPANEDIOL 10 MG PO TABS: 10.0000 mg | ORAL_TABLET | Freq: Every day | ORAL | Status: DC

## 2022-12-11 MED ORDER — CARVEDILOL 3.125 MG PO TABS
3.1250 mg | ORAL_TABLET | Freq: Two times a day (BID) | ORAL | 1 refills | Status: DC
  Filled 2022-12-11: qty 60, 30d supply, fill #0

## 2022-12-11 MED ORDER — TORSEMIDE 20 MG PO TABS
20.0000 mg | ORAL_TABLET | Freq: Every day | ORAL | 1 refills | Status: DC
  Filled 2022-12-11: qty 30, 30d supply, fill #0

## 2022-12-11 NOTE — Progress Notes (Signed)
Discharge instructions reviewed with pt, he verbalizes understanding.  Copy of instructions given to pt. [facility] TOC Pharmacy has filled his scripts and will be picked up on his way out for discharge once his niece gets here which is expected to be around 3:00-3:30, she gets off work at 2:30p in [place], has to go by pt's house and get his clothes and then will be here to pick him up.  Pt resting in bed, fatigues easily and fell asleep some during instructions, woke up easily and able to teach back his instructions. Pt's DME cane and O2 tank have arrived and are at the bedside for pt to take home.    Shoni Quijas,RN SWOT

## 2022-12-11 NOTE — TOC Transition Note (Signed)
Transition of Care [facility]) - CM/SW Discharge Note   Patient Details  Name: Dennis Swanson MRN: [id number] Date of Birth: 12/08/1955  Transition of Care [facility]) CM/SW Contact:  Leone Haven, RN Phone Number: 12/11/2022, 10:54 AM   Clinical Narrative:    Patient is for dc today, [employer] to supply cane and oxygen prior to dc.  He has transport,     Final next level of care: Home w Home Health Services Barriers to Discharge: Equipment Delay   Patient Goals and CMS Choice CMS Medicare.gov Compare Post Acute Care list provided to:: Patient Choice offered to / list presented to : Patient  Discharge Placement                         Discharge Plan and Services Additional resources added to the After Visit Summary for   In-house Referral: NA Discharge Planning Services: CM Consult Post Acute Care Choice: Home Health          DME Arranged: Oxygen, Cane DME Agency: [employer] Date DME Agency Contacted: 12/11/22 Time DME Agency Contacted: 1050 Representative spoke with at DME Agency: Vonna Kotyk HH Arranged: PT HH Agency:  Cindie Laroche) Date HH Agency Contacted: 12/11/22 Time HH Agency Contacted: 1051 Representative spoke with at [facility] Agency: Huntley Dec  Social Determinants of Health (SDOH) Interventions SDOH Screenings   Food Insecurity: No Food Insecurity (12/07/2022)  Housing: Low Risk  (12/09/2022)  Transportation Needs: No Transportation Needs (12/09/2022)  Utilities: Not At Risk (12/07/2022)  Alcohol Screen: Low Risk  (12/09/2022)  Financial Resource Strain: Low Risk  (12/09/2022)  Physical Activity: Inactive (10/02/2020)  Tobacco Use: Low Risk  (12/07/2022)     Readmission Risk Interventions     No data to display

## 2022-12-11 NOTE — TOC Initial Note (Addendum)
Transition of Care [facility]) - Initial/Assessment Note    Patient Details  Name: Dennis Swanson MRN: [id number] Date of Birth: 1955/05/29  Transition of Care [facility]) CM/SW Contact:    Leone Haven, RN Phone Number: 12/11/2022, 10:51 AM  Clinical Narrative:                 From home alone, has PCP and insurance on file, states has no HH services in place at this time or DME at home.   Per pt eval rec HHPT, NCM offered choice, he does not have a preference, NCM made referral to Huntley Dec with [facility], she will check to see if can take referral and get back with this NCM.  Patient states he needs a cane also along with the oxygen, NCM made referral to [facility] with [employer], he will bring the cane and oxygen up to room prior to dc. States family member will transport them home at [employer] and family is support system,   Pta self ambulatory. Per Huntley Dec with [facility] she is unable to take referral.  NCM made referral to [facility] with [facility] . Awaiting call back. NCM made referral to [facility] with Frances Furbish, he is able to take referral with soc next week.    Expected Discharge Plan: Home w Home Health Services Barriers to Discharge: Equipment Delay   Patient Goals and CMS Choice Patient states their goals for this hospitalization and ongoing recovery are:: return home CMS Medicare.gov Compare Post Acute Care list provided to:: Patient Choice offered to / list presented to : Patient      Expected Discharge Plan and Services In-house Referral: NA Discharge Planning Services: CM Consult Post Acute Care Choice: Home Health Living arrangements for the past 2 months: Apartment Expected Discharge Date: 12/11/22               DME Arranged: Ardeen Garland DME Agency: [employer] Date DME Agency Contacted: 12/11/22 Time DME Agency Contacted: 1050 Representative spoke with at DME Agency: Vonna Kotyk HH Arranged: PT HH Agency:  Cindie Laroche) Date HH Agency Contacted: 12/11/22 Time HH Agency Contacted:  1051 Representative spoke with at [facility] Agency: Huntley Dec  Prior Living Arrangements/Services Living arrangements for the past 2 months: Apartment Lives with:: Self Patient language and need for interpreter reviewed:: Yes Do you feel safe going back to the place where you live?: Yes      Need for Family Participation in Patient Care: Yes (Comment) Care giver support system in place?: No (comment)   Criminal Activity/Legal Involvement Pertinent to Current Situation/Hospitalization: No - Comment as needed  Activities of Daily Living   ADL Screening (condition at time of admission) Independently performs ADLs?: Yes (appropriate for developmental age) Is the patient deaf or have difficulty hearing?: No Does the patient have difficulty seeing, even when wearing glasses/contacts?: No Does the patient have difficulty concentrating, remembering, or making decisions?: No  Permission Sought/Granted Permission sought to share information with : Case Manager Permission granted to share information with : Yes, Verbal Permission Granted              Emotional Assessment   Attitude/Demeanor/Rapport: Engaged Affect (typically observed): Appropriate Orientation: : Oriented to Self, Oriented to Place, Oriented to  Time, Oriented to Situation  Psych Involvement: No (comment)  Admission diagnosis:  Leg edema [R60.0] Acute respiratory failure with hypoxia (HCC) [J96.01] Failure of outpatient treatment [Z78.9] Patient Active Problem List   Diagnosis Date Noted   RVF (right ventricular failure) (HCC) 12/08/2022   Acute respiratory failure  with hypoxia (HCC) 12/07/2022   Heart failure with mid-range ejection fraction (HFmEF) (HCC) 12/07/2022   AKI (acute kidney injury) (HCC) 12/07/2022   History of pulmonary embolism 12/07/2022   Obesity (BMI 30-39.9) 12/07/2022   Gastroesophageal reflux disease 05/20/2022   Hyperplastic colonic polyp 05/19/2021   Non-recurrent bilateral inguinal hernia without  obstruction or gangrene 05/19/2021   Prolapsed internal hemorrhoids, grade 3 05/19/2021   Hypoxia 04/18/2021   S/P hemorrhoidectomy 04/17/2021   Liver lesion 03/20/2021   Iron deficiency anemia 03/20/2021   Aortic atherosclerosis (HCC) 03/13/2021   Acute hypoxemic respiratory failure (HCC) 09/22/2020   Elevated troponin 09/22/2020   Acute CHF (congestive heart failure) (HCC) 09/21/2020   Mass of anus    Symptomatic anemia 09/14/2020   CKD (chronic kidney disease) stage 3, GFR 30-59 ml/min (HCC) 09/14/2020   Systolic and diastolic CHF, chronic (HCC) 09/14/2020   Proteinuria    Gastrointestinal hemorrhage    Anemia 09/13/2020   Acute on chronic diastolic heart failure (HCC) 09/13/2020   Obesity (BMI 30.0-34.9) 09/13/2020   Rectal bleeding 02/24/2017   Morbid obesity (HCC) 02/24/2017   Acute exacerbation of CHF (congestive heart failure) (HCC) 12/06/2015   Encounter for therapeutic drug monitoring 04/10/2015   Atrial fibrillation (HCC) 04/03/2015   Nonischemic cardiomyopathy (HCC) 04/03/2015   Current use of long term anticoagulation 04/03/2015   Colon cancer screening 04/27/2012   Essential hypertension 04/27/2012   Long term current use of anticoagulant 05/13/2010   Congestive heart failure (HCC) 12/17/2009   Alcohol use disorder, severe, in early remission (HCC) 09/27/2009   PCP:  Kirstie Peri, MD Pharmacy:   [facility] [address] [place] - [address] [place] [address] Phone: [phone] Fax: [phone]  [facility] [address] [place] [address] Phone: [phone] Fax: [phone]     Social Determinants of Health (SDOH) Social History: SDOH Screenings   Food Insecurity: No Food Insecurity (12/07/2022)  Housing: Low Risk  (12/09/2022)  Transportation Needs: No Transportation Needs (12/09/2022)  Utilities: Not At Risk (12/07/2022)  Alcohol Screen: Low Risk  (12/09/2022)  Financial  Resource Strain: Low Risk  (12/09/2022)  Physical Activity: Inactive (10/02/2020)  Tobacco Use: Low Risk  (12/07/2022)   SDOH Interventions: Housing Interventions: Intervention Not Indicated Transportation Interventions: Intervention Not Indicated Alcohol Usage Interventions: Intervention Not Indicated (Score <7) Financial Strain Interventions: Intervention Not Indicated   Readmission Risk Interventions     No data to display

## 2022-12-11 NOTE — Care Management Important Message (Signed)
Important Message  Patient Details  Name: TORRIANO SESSIONS MRN: [id number] Date of Birth: 07/01/1955   Important Message Given:  Yes - Medicare IM     Sherilyn Banker 12/11/2022, 10:37 AM

## 2022-12-11 NOTE — Progress Notes (Signed)
Heart Failure Stewardship Pharmacist Progress Note   PCP: Kirstie Peri, MD PCP-Cardiologist: Dina Rich, MD    HPI:  [phone] with PMH of chronic biventricular CHF, permanent A-fib, hx of PE (2011), HTN, HLD, obesity, alcohol use disorder, T2DM. Initially presented to ED on 10/21 with progressive DOE with minimal activity, progressive LE edema (weight gain ~30 lbs), abdominal distention. Denied CP. O2 sat down to 79% on RA. Required BiPAP overnight on 10/21 for acute respiratory failure.   HF exacerbation potentially due to dietary excursions - eating salty foods and consuming alcohol. CXR 10/21 demonstrated stable cardiomegaly, clear lungs. Echo 10/22 demonstrated EF 50-55%, LV low-normal function with no RWMA, moderate LVH, indeterminate diastolic parameters, RV function severely reduced, enlarged RV, severely elevated PA pressure. Findings similar to study in Nov 2023, overall improved EF from 45-50% in 2022. Vascular ultrasound 10/23 did not demonstrate evidence of DVT, despite swelling L > R, though study quality was limited. Scr and BNP elevated on admission. Does not appear that pt has ever had ischemic work up.   Upon interview pt states that he is feeling slightly better - still on O2 via [place] (last charted at 2L). Not on O2 at home. Still has bilateral pitting edema with increased swelling in the L lower extremity. Denies dizziness/lightheadedness when moving from laying to sitting and bed to chair. Reports that he was tolerating all his medications very well prior to discharge. Denies missed doses of medications. Organizes his own medications at home. Reports that he occasionally monitors his BP at home and it is usually 120-130s/80s - which is consistent with documented outpatient appt readings. Noted that pt was prescribed Farxiga in 2022 - no AE noted, appears to have fallen off medication list  Reports that he has not had a worsening HF event in several years (appears last HF  admission was 2022). Believes worsening condition may have been exacerbated by eating salty food (specifically catfish).  Current HF Medications: Diuretic: torsemide 20 mg PO daily Beta Blocker: carvedilol 3.125 mg PO BID (decreased from 6.25) SGLT2i: dapagliflozin 10 mg PO daily  Prior to admission HF Medications: Diuretic: furosemide 40 mg PO BID Beta blocker: carvedilol 6.25 mg PO BID ACE/ARB/ARNI: Entresto (sacubitril/valsartan) 24-26 mg PO BID MRA: spironolactone 25 mg PO daily.    Fill hx good for maintenance medications  Pertinent Lab Values: Serum creatinine 1.35 (BL ~1.1), BUN 16, Potassium 4.1, Sodium 141, BNP 533, Trop 31>30, Magnesium 1.8  Vital Signs: Weight: 270.1 lbs (admission weight: 276.68 lbs) - dry weight ~255 lbs (July 2024) Blood pressure: 120-130s/70-90s Heart rate: 80s (NSR vs Afib) I/O: net -0.4L yesterday; net -2.8L since admission - pt reports that I/Os may not be completely accurate  Medication Assistance / Insurance Benefits Check: Does the patient have prescription insurance?  Yes Type of insurance plan: [employer] Dual Complete Medicare/Medicaid  Farxiga/Jardiance copay $0/30ds  Does the patient qualify for medication assistance through manufacturers or grants?   No  Outpatient Pharmacy:  Prior to admission outpatient pharmacy: [facility] in [place] Is the patient willing to use [facility] TOC pharmacy at discharge? Yes Is the patient willing to transition their outpatient pharmacy to utilize a [facility] outpatient pharmacy?   No    Assessment: 1. Acute on chronic biventricular CHF (LVEF 50-55%), HFimpEF, unclear etiology. NYHA class IV symptoms. - Pt with improved bilateral LE edema (L > R). Weight has continued to decrease, down ~6lbs since admission. Pt stable on PO diuretic. I/Os incomplete per pt.  - Still on  2L [place], which he does not use at home. Reports he did have fatigue and ShOB during PT/OT yesterday. Felt he was deconditioned.  - Tolerating  SGLT2i well. Should help with diuresis. BG wnl.  - BP stable, pt without s/sx of hypotension. Scr slightly elevated above BL, but stable. Appropriate to resume home Entresto at discharge given close outpatient f/u. Ok with holding MRA at discharge given Scr.  - Assess ability to resume home dose of carvedilol today vs at follow-up  Plan: 1) Medication changes recommended at this time: - Continue carvedilol 3.125 mg PO BID - Continue dapagliflozin (Farxiga) 10 mg PO daily - Continue torsemide 20 mg PO daily - Restart sacubitril-valsartan (Entresto) 24-26 mg PO BID at discharge  2) Patient assistance: - No patient assistance needs noted at this time  3)  Education  - Initial education completed including importance of adherence to maintenance medications and dietary restrictions to prevent fluid accumulation. Patient aware that medications will be adjusted while admitted, and may continue to be adjusted post-discharge. Full education to be completed prior to discharge  Nils Pyle, PharmD PGY1 Pharmacy Resident

## 2022-12-11 NOTE — Discharge Summary (Signed)
HGB 14.5 16.7 14.9 15.2 13.7  HCT 48.1 49.0 49.4 51.1 45.4  MCV 99.4  --  100.4* 101.4* 99.8  PLT 117*  --  99* 105* 109*   BNP: BNP (last 3 results) Recent Labs    12/07/22 1836  BNP 533.6*     IMAGING STUDIES VAS [place] LOWER EXTREMITY VENOUS (DVT)  Result Date: 12/09/2022  Lower Venous DVT Study Patient Name:  Dennis Swanson  Date of Exam:   12/09/2022 Medical Rec #: [id number]            Accession #:    [id number] Date of Birth: 26-Sep-1955             Patient Gender: M Patient Age:   32 years Exam Location:  [facility] Procedure:      VAS [place] LOWER EXTREMITY VENOUS (DVT) Referring  Phys: ANAND HONGALGI --------------------------------------------------------------------------------  Indications: Swelling.  Risk Factors: None identified. Limitations: Body habitus and poor ultrasound/tissue interface. Comparison Study: No prior studies. Performing Technologist: Chanda Busing RVT  Examination Guidelines: A complete evaluation includes B-mode imaging, spectral Doppler, color Doppler, and power Doppler as needed of all accessible portions of each vessel. Bilateral testing is considered an integral part of a complete examination. Limited examinations for reoccurring indications may be performed as noted. The reflux portion of the exam is performed with the patient in reverse Trendelenburg.  +---------+---------------+---------+-----------+----------+--------------+ RIGHT    CompressibilityPhasicitySpontaneityPropertiesThrombus Aging +---------+---------------+---------+-----------+----------+--------------+ CFV      Full           Yes      No                                  +---------+---------------+---------+-----------+----------+--------------+ SFJ      Full                                                        +---------+---------------+---------+-----------+----------+--------------+ FV Prox  Full                                                        +---------+---------------+---------+-----------+----------+--------------+ FV Mid   Full                                                        +---------+---------------+---------+-----------+----------+--------------+ FV DistalFull                                                        +---------+---------------+---------+-----------+----------+--------------+ PFV      Full                                                        +---------+---------------+---------+-----------+----------+--------------+  Yes      No                                       +---------+---------------+---------+-----------+----------+-------------------+ PTV      Full                                                             +---------+---------------+---------+-----------+----------+-------------------+ PERO                                                   Not well visualized +---------+---------------+---------+-----------+----------+-------------------+     Summary: RIGHT: - There is no evidence of deep vein thrombosis in the lower extremity. However, portions of this examination were limited- see technologist comments above.  - No cystic structure found in the popliteal fossa.  LEFT: - There is no evidence of deep vein thrombosis in the lower extremity. However, portions of this examination were limited- see technologist comments above.  - No cystic structure found in the popliteal fossa.  *See table(s) above for measurements and observations. Electronically signed by Coral Else MD on 12/09/2022 at 6:24:30 PM.    Final    ECHOCARDIOGRAM COMPLETE  Result Date: 12/08/2022    ECHOCARDIOGRAM REPORT   Patient Name:   Dennis Swanson Date of Exam: 12/08/2022 Medical Rec #:  [id number]           Height:       72.0 in Accession #:    [id number]          Weight:       276.7 lb Date of Birth:  1956-02-11            BSA:          2.446 m Patient Age:    67 years            BP:           115/82 mmHg Patient Gender: M                   HR:           80 bpm. Exam Location:  Inpatient Procedure: 2D Echo, 3D Echo, Cardiac Doppler and Color Doppler Indications:    I50.40* Unspecified combined systolic (congestive) and diastolic                 (congestive) heart failure  History:        Patient has prior history of Echocardiogram examinations, most                 recent 01/06/2022. CHF and Cardiomyopathy, Abnormal ECG,                 Arrythmias:Atrial Fibrillation, Signs/Symptoms:Chest Pain; Risk                 Factors:Hypertension. ETOH. Pulmonary embolus. Right ventricular                 failure.  Sonographer:    Sheralyn Boatman RDCS Referring Phys: [id number] RONDELL A SMITH IMPRESSIONS  1. Left ventricular ejection  Yes      No                                       +---------+---------------+---------+-----------+----------+-------------------+ PTV      Full                                                             +---------+---------------+---------+-----------+----------+-------------------+ PERO                                                   Not well visualized +---------+---------------+---------+-----------+----------+-------------------+     Summary: RIGHT: - There is no evidence of deep vein thrombosis in the lower extremity. However, portions of this examination were limited- see technologist comments above.  - No cystic structure found in the popliteal fossa.  LEFT: - There is no evidence of deep vein thrombosis in the lower extremity. However, portions of this examination were limited- see technologist comments above.  - No cystic structure found in the popliteal fossa.  *See table(s) above for measurements and observations. Electronically signed by Coral Else MD on 12/09/2022 at 6:24:30 PM.    Final    ECHOCARDIOGRAM COMPLETE  Result Date: 12/08/2022    ECHOCARDIOGRAM REPORT   Patient Name:   Dennis Swanson Date of Exam: 12/08/2022 Medical Rec #:  [id number]           Height:       72.0 in Accession #:    [id number]          Weight:       276.7 lb Date of Birth:  1956-02-11            BSA:          2.446 m Patient Age:    67 years            BP:           115/82 mmHg Patient Gender: M                   HR:           80 bpm. Exam Location:  Inpatient Procedure: 2D Echo, 3D Echo, Cardiac Doppler and Color Doppler Indications:    I50.40* Unspecified combined systolic (congestive) and diastolic                 (congestive) heart failure  History:        Patient has prior history of Echocardiogram examinations, most                 recent 01/06/2022. CHF and Cardiomyopathy, Abnormal ECG,                 Arrythmias:Atrial Fibrillation, Signs/Symptoms:Chest Pain; Risk                 Factors:Hypertension. ETOH. Pulmonary embolus. Right ventricular                 failure.  Sonographer:    Sheralyn Boatman RDCS Referring Phys: [id number] RONDELL A SMITH IMPRESSIONS  1. Left ventricular ejection  [facility]  Physician Discharge Summary   Patient ID: Dennis Swanson MRN: [id number] DOB/AGE: 03-Jul-1955 67 y.o.  Admit date: 12/07/2022 Discharge date: 12/11/2022    PCP: Kirstie Peri, MD  DISCHARGE DIAGNOSES:    Acute respiratory failure with hypoxia (HCC)   AKI (acute kidney injury) [facility])   Essential hypertension   Long term current use of anticoagulant   Atrial fibrillation (HCC)   History of pulmonary embolism   Obesity (BMI 30-39.9)   Acute on chronic diastolic heart failure (HCC)   RVF (right ventricular failure) (HCC)   RECOMMENDATIONS FOR OUTPATIENT FOLLOW UP: Home oxygen has been ordered Cardiology to schedule outpatient follow-up Patient to follow-up with his pulmonologist    Home Health: None Equipment/Devices: Home oxygen  CODE STATUS: Full code  DISCHARGE CONDITION: fair  Diet recommendation: Heart healthy  INITIAL HISTORY: 66 year old male, lives alone, independent, medical history significant for chronic systolic CHF, A-fib, pulmonary embolism on Eliquis, HTN, HLD, obesity, chronic iron deficiency anemia, alcohol use disorder, who presented to the ED on 12/07/2022 with complaints of progressive dyspnea on exertion with minimal activity, progressive lower extremity edema, weight gain possibly up to 30 pounds, abdominal distention but no chest pain. Reports dietary indiscretion and eating salty food and consuming alcohol. In the ED, oxygen saturations in the 79% on room air, required 6 L/min Arthur oxygen to bring up saturations to 92%. Overnight of admission, required BiPAP for acute on chronic hypoxic and hypercapnic respiratory failure. Admitted for acute on chronic diastolic CHF, acute on chronic hypoxic and hypercapnic respiratory failure.    HOSPITAL COURSE:   Acute on suspected chronic hypoxic and hypercapnic respiratory failure Multifactorial due to decompensated CHF complicating underlying suspected OSA/OHS, ?? COPD/asthma. VBG on  admission: pH 7.26, pCO2 111, pO2 <31. Presented with oxygen saturation of 79% on room air on admission. Patient required BiPAP in the night of admission but then transitioned to nasal cannula. Has seen pulmonology in July 2024, assessed to have biventricular heart failure, remote PE, negative collagen studies, non-smoker, planned for home sleep testing.  Will allow this to be pursued further in the outpatient setting.  Respiratory status is stable for the most part.  Home oxygen has been ordered.   Acute on chronic diastolic CHF Echocardiogram done during this hospital stay shows LVEF of 50 to 55%.  Moderate LVH was noted.  Previously he has had a EF as low as 40 to 45%. HS Troponin 31 > 30, minimally elevated, flat trend, no chest pain, suspect demand ischemia. Given IV furosemide.  Transition to oral diuretics. Entresto was placed on hold.  Was not supposed to be discharged on this.  However looks like this has been resumed by the pharmacist at discharge.  Not clear who directed this pharmacist to resume this medication.   A-fib with controlled ventricular rate: Continue carvedilol and apixaban.   Acute kidney injury complicating stage II CKD/hyperkalemia Last known creatinine 1.17 in May 23.   Presented with creatinine of 1.65,?  Cardiorenal syndrome Renal function stable for the most part.    Hypernatremia, mild Sodium level has improved   Thrombocytopenia Stable.   Essential hypertension   History of pulmonary embolism Continue Eliquis.  No DVT on Doppler studies.   Alcohol use disorder Does not appear to be forthcoming about the exact amount of alcohol he consumes. CIWA protocol. No evidence for withdrawal currently.   Suspected sleep apnea Outpatient follow-up with pulmonology for sleep study. CPAP at bedtime   Aortic dilatation Noted on echocardiogram.  [facility]  Physician Discharge Summary   Patient ID: Dennis Swanson MRN: [id number] DOB/AGE: 03-Jul-1955 67 y.o.  Admit date: 12/07/2022 Discharge date: 12/11/2022    PCP: Kirstie Peri, MD  DISCHARGE DIAGNOSES:    Acute respiratory failure with hypoxia (HCC)   AKI (acute kidney injury) [facility])   Essential hypertension   Long term current use of anticoagulant   Atrial fibrillation (HCC)   History of pulmonary embolism   Obesity (BMI 30-39.9)   Acute on chronic diastolic heart failure (HCC)   RVF (right ventricular failure) (HCC)   RECOMMENDATIONS FOR OUTPATIENT FOLLOW UP: Home oxygen has been ordered Cardiology to schedule outpatient follow-up Patient to follow-up with his pulmonologist    Home Health: None Equipment/Devices: Home oxygen  CODE STATUS: Full code  DISCHARGE CONDITION: fair  Diet recommendation: Heart healthy  INITIAL HISTORY: 66 year old male, lives alone, independent, medical history significant for chronic systolic CHF, A-fib, pulmonary embolism on Eliquis, HTN, HLD, obesity, chronic iron deficiency anemia, alcohol use disorder, who presented to the ED on 12/07/2022 with complaints of progressive dyspnea on exertion with minimal activity, progressive lower extremity edema, weight gain possibly up to 30 pounds, abdominal distention but no chest pain. Reports dietary indiscretion and eating salty food and consuming alcohol. In the ED, oxygen saturations in the 79% on room air, required 6 L/min Arthur oxygen to bring up saturations to 92%. Overnight of admission, required BiPAP for acute on chronic hypoxic and hypercapnic respiratory failure. Admitted for acute on chronic diastolic CHF, acute on chronic hypoxic and hypercapnic respiratory failure.    HOSPITAL COURSE:   Acute on suspected chronic hypoxic and hypercapnic respiratory failure Multifactorial due to decompensated CHF complicating underlying suspected OSA/OHS, ?? COPD/asthma. VBG on  admission: pH 7.26, pCO2 111, pO2 <31. Presented with oxygen saturation of 79% on room air on admission. Patient required BiPAP in the night of admission but then transitioned to nasal cannula. Has seen pulmonology in July 2024, assessed to have biventricular heart failure, remote PE, negative collagen studies, non-smoker, planned for home sleep testing.  Will allow this to be pursued further in the outpatient setting.  Respiratory status is stable for the most part.  Home oxygen has been ordered.   Acute on chronic diastolic CHF Echocardiogram done during this hospital stay shows LVEF of 50 to 55%.  Moderate LVH was noted.  Previously he has had a EF as low as 40 to 45%. HS Troponin 31 > 30, minimally elevated, flat trend, no chest pain, suspect demand ischemia. Given IV furosemide.  Transition to oral diuretics. Entresto was placed on hold.  Was not supposed to be discharged on this.  However looks like this has been resumed by the pharmacist at discharge.  Not clear who directed this pharmacist to resume this medication.   A-fib with controlled ventricular rate: Continue carvedilol and apixaban.   Acute kidney injury complicating stage II CKD/hyperkalemia Last known creatinine 1.17 in May 23.   Presented with creatinine of 1.65,?  Cardiorenal syndrome Renal function stable for the most part.    Hypernatremia, mild Sodium level has improved   Thrombocytopenia Stable.   Essential hypertension   History of pulmonary embolism Continue Eliquis.  No DVT on Doppler studies.   Alcohol use disorder Does not appear to be forthcoming about the exact amount of alcohol he consumes. CIWA protocol. No evidence for withdrawal currently.   Suspected sleep apnea Outpatient follow-up with pulmonology for sleep study. CPAP at bedtime   Aortic dilatation Noted on echocardiogram.  [facility]  Physician Discharge Summary   Patient ID: Dennis Swanson MRN: [id number] DOB/AGE: 03-Jul-1955 67 y.o.  Admit date: 12/07/2022 Discharge date: 12/11/2022    PCP: Kirstie Peri, MD  DISCHARGE DIAGNOSES:    Acute respiratory failure with hypoxia (HCC)   AKI (acute kidney injury) [facility])   Essential hypertension   Long term current use of anticoagulant   Atrial fibrillation (HCC)   History of pulmonary embolism   Obesity (BMI 30-39.9)   Acute on chronic diastolic heart failure (HCC)   RVF (right ventricular failure) (HCC)   RECOMMENDATIONS FOR OUTPATIENT FOLLOW UP: Home oxygen has been ordered Cardiology to schedule outpatient follow-up Patient to follow-up with his pulmonologist    Home Health: None Equipment/Devices: Home oxygen  CODE STATUS: Full code  DISCHARGE CONDITION: fair  Diet recommendation: Heart healthy  INITIAL HISTORY: 66 year old male, lives alone, independent, medical history significant for chronic systolic CHF, A-fib, pulmonary embolism on Eliquis, HTN, HLD, obesity, chronic iron deficiency anemia, alcohol use disorder, who presented to the ED on 12/07/2022 with complaints of progressive dyspnea on exertion with minimal activity, progressive lower extremity edema, weight gain possibly up to 30 pounds, abdominal distention but no chest pain. Reports dietary indiscretion and eating salty food and consuming alcohol. In the ED, oxygen saturations in the 79% on room air, required 6 L/min Arthur oxygen to bring up saturations to 92%. Overnight of admission, required BiPAP for acute on chronic hypoxic and hypercapnic respiratory failure. Admitted for acute on chronic diastolic CHF, acute on chronic hypoxic and hypercapnic respiratory failure.    HOSPITAL COURSE:   Acute on suspected chronic hypoxic and hypercapnic respiratory failure Multifactorial due to decompensated CHF complicating underlying suspected OSA/OHS, ?? COPD/asthma. VBG on  admission: pH 7.26, pCO2 111, pO2 <31. Presented with oxygen saturation of 79% on room air on admission. Patient required BiPAP in the night of admission but then transitioned to nasal cannula. Has seen pulmonology in July 2024, assessed to have biventricular heart failure, remote PE, negative collagen studies, non-smoker, planned for home sleep testing.  Will allow this to be pursued further in the outpatient setting.  Respiratory status is stable for the most part.  Home oxygen has been ordered.   Acute on chronic diastolic CHF Echocardiogram done during this hospital stay shows LVEF of 50 to 55%.  Moderate LVH was noted.  Previously he has had a EF as low as 40 to 45%. HS Troponin 31 > 30, minimally elevated, flat trend, no chest pain, suspect demand ischemia. Given IV furosemide.  Transition to oral diuretics. Entresto was placed on hold.  Was not supposed to be discharged on this.  However looks like this has been resumed by the pharmacist at discharge.  Not clear who directed this pharmacist to resume this medication.   A-fib with controlled ventricular rate: Continue carvedilol and apixaban.   Acute kidney injury complicating stage II CKD/hyperkalemia Last known creatinine 1.17 in May 23.   Presented with creatinine of 1.65,?  Cardiorenal syndrome Renal function stable for the most part.    Hypernatremia, mild Sodium level has improved   Thrombocytopenia Stable.   Essential hypertension   History of pulmonary embolism Continue Eliquis.  No DVT on Doppler studies.   Alcohol use disorder Does not appear to be forthcoming about the exact amount of alcohol he consumes. CIWA protocol. No evidence for withdrawal currently.   Suspected sleep apnea Outpatient follow-up with pulmonology for sleep study. CPAP at bedtime   Aortic dilatation Noted on echocardiogram.  HGB 14.5 16.7 14.9 15.2 13.7  HCT 48.1 49.0 49.4 51.1 45.4  MCV 99.4  --  100.4* 101.4* 99.8  PLT 117*  --  99* 105* 109*   BNP: BNP (last 3 results) Recent Labs    12/07/22 1836  BNP 533.6*     IMAGING STUDIES VAS [place] LOWER EXTREMITY VENOUS (DVT)  Result Date: 12/09/2022  Lower Venous DVT Study Patient Name:  Dennis Swanson  Date of Exam:   12/09/2022 Medical Rec #: [id number]            Accession #:    [id number] Date of Birth: 26-Sep-1955             Patient Gender: M Patient Age:   32 years Exam Location:  [facility] Procedure:      VAS [place] LOWER EXTREMITY VENOUS (DVT) Referring  Phys: ANAND HONGALGI --------------------------------------------------------------------------------  Indications: Swelling.  Risk Factors: None identified. Limitations: Body habitus and poor ultrasound/tissue interface. Comparison Study: No prior studies. Performing Technologist: Chanda Busing RVT  Examination Guidelines: A complete evaluation includes B-mode imaging, spectral Doppler, color Doppler, and power Doppler as needed of all accessible portions of each vessel. Bilateral testing is considered an integral part of a complete examination. Limited examinations for reoccurring indications may be performed as noted. The reflux portion of the exam is performed with the patient in reverse Trendelenburg.  +---------+---------------+---------+-----------+----------+--------------+ RIGHT    CompressibilityPhasicitySpontaneityPropertiesThrombus Aging +---------+---------------+---------+-----------+----------+--------------+ CFV      Full           Yes      No                                  +---------+---------------+---------+-----------+----------+--------------+ SFJ      Full                                                        +---------+---------------+---------+-----------+----------+--------------+ FV Prox  Full                                                        +---------+---------------+---------+-----------+----------+--------------+ FV Mid   Full                                                        +---------+---------------+---------+-----------+----------+--------------+ FV DistalFull                                                        +---------+---------------+---------+-----------+----------+--------------+ PFV      Full                                                        +---------+---------------+---------+-----------+----------+--------------+  [facility]  Physician Discharge Summary   Patient ID: Dennis Swanson MRN: [id number] DOB/AGE: 03-Jul-1955 67 y.o.  Admit date: 12/07/2022 Discharge date: 12/11/2022    PCP: Kirstie Peri, MD  DISCHARGE DIAGNOSES:    Acute respiratory failure with hypoxia (HCC)   AKI (acute kidney injury) [facility])   Essential hypertension   Long term current use of anticoagulant   Atrial fibrillation (HCC)   History of pulmonary embolism   Obesity (BMI 30-39.9)   Acute on chronic diastolic heart failure (HCC)   RVF (right ventricular failure) (HCC)   RECOMMENDATIONS FOR OUTPATIENT FOLLOW UP: Home oxygen has been ordered Cardiology to schedule outpatient follow-up Patient to follow-up with his pulmonologist    Home Health: None Equipment/Devices: Home oxygen  CODE STATUS: Full code  DISCHARGE CONDITION: fair  Diet recommendation: Heart healthy  INITIAL HISTORY: 66 year old male, lives alone, independent, medical history significant for chronic systolic CHF, A-fib, pulmonary embolism on Eliquis, HTN, HLD, obesity, chronic iron deficiency anemia, alcohol use disorder, who presented to the ED on 12/07/2022 with complaints of progressive dyspnea on exertion with minimal activity, progressive lower extremity edema, weight gain possibly up to 30 pounds, abdominal distention but no chest pain. Reports dietary indiscretion and eating salty food and consuming alcohol. In the ED, oxygen saturations in the 79% on room air, required 6 L/min Arthur oxygen to bring up saturations to 92%. Overnight of admission, required BiPAP for acute on chronic hypoxic and hypercapnic respiratory failure. Admitted for acute on chronic diastolic CHF, acute on chronic hypoxic and hypercapnic respiratory failure.    HOSPITAL COURSE:   Acute on suspected chronic hypoxic and hypercapnic respiratory failure Multifactorial due to decompensated CHF complicating underlying suspected OSA/OHS, ?? COPD/asthma. VBG on  admission: pH 7.26, pCO2 111, pO2 <31. Presented with oxygen saturation of 79% on room air on admission. Patient required BiPAP in the night of admission but then transitioned to nasal cannula. Has seen pulmonology in July 2024, assessed to have biventricular heart failure, remote PE, negative collagen studies, non-smoker, planned for home sleep testing.  Will allow this to be pursued further in the outpatient setting.  Respiratory status is stable for the most part.  Home oxygen has been ordered.   Acute on chronic diastolic CHF Echocardiogram done during this hospital stay shows LVEF of 50 to 55%.  Moderate LVH was noted.  Previously he has had a EF as low as 40 to 45%. HS Troponin 31 > 30, minimally elevated, flat trend, no chest pain, suspect demand ischemia. Given IV furosemide.  Transition to oral diuretics. Entresto was placed on hold.  Was not supposed to be discharged on this.  However looks like this has been resumed by the pharmacist at discharge.  Not clear who directed this pharmacist to resume this medication.   A-fib with controlled ventricular rate: Continue carvedilol and apixaban.   Acute kidney injury complicating stage II CKD/hyperkalemia Last known creatinine 1.17 in May 23.   Presented with creatinine of 1.65,?  Cardiorenal syndrome Renal function stable for the most part.    Hypernatremia, mild Sodium level has improved   Thrombocytopenia Stable.   Essential hypertension   History of pulmonary embolism Continue Eliquis.  No DVT on Doppler studies.   Alcohol use disorder Does not appear to be forthcoming about the exact amount of alcohol he consumes. CIWA protocol. No evidence for withdrawal currently.   Suspected sleep apnea Outpatient follow-up with pulmonology for sleep study. CPAP at bedtime   Aortic dilatation Noted on echocardiogram.  HGB 14.5 16.7 14.9 15.2 13.7  HCT 48.1 49.0 49.4 51.1 45.4  MCV 99.4  --  100.4* 101.4* 99.8  PLT 117*  --  99* 105* 109*   BNP: BNP (last 3 results) Recent Labs    12/07/22 1836  BNP 533.6*     IMAGING STUDIES VAS [place] LOWER EXTREMITY VENOUS (DVT)  Result Date: 12/09/2022  Lower Venous DVT Study Patient Name:  Dennis Swanson  Date of Exam:   12/09/2022 Medical Rec #: [id number]            Accession #:    [id number] Date of Birth: 26-Sep-1955             Patient Gender: M Patient Age:   32 years Exam Location:  [facility] Procedure:      VAS [place] LOWER EXTREMITY VENOUS (DVT) Referring  Phys: ANAND HONGALGI --------------------------------------------------------------------------------  Indications: Swelling.  Risk Factors: None identified. Limitations: Body habitus and poor ultrasound/tissue interface. Comparison Study: No prior studies. Performing Technologist: Chanda Busing RVT  Examination Guidelines: A complete evaluation includes B-mode imaging, spectral Doppler, color Doppler, and power Doppler as needed of all accessible portions of each vessel. Bilateral testing is considered an integral part of a complete examination. Limited examinations for reoccurring indications may be performed as noted. The reflux portion of the exam is performed with the patient in reverse Trendelenburg.  +---------+---------------+---------+-----------+----------+--------------+ RIGHT    CompressibilityPhasicitySpontaneityPropertiesThrombus Aging +---------+---------------+---------+-----------+----------+--------------+ CFV      Full           Yes      No                                  +---------+---------------+---------+-----------+----------+--------------+ SFJ      Full                                                        +---------+---------------+---------+-----------+----------+--------------+ FV Prox  Full                                                        +---------+---------------+---------+-----------+----------+--------------+ FV Mid   Full                                                        +---------+---------------+---------+-----------+----------+--------------+ FV DistalFull                                                        +---------+---------------+---------+-----------+----------+--------------+ PFV      Full                                                        +---------+---------------+---------+-----------+----------+--------------+  HGB 14.5 16.7 14.9 15.2 13.7  HCT 48.1 49.0 49.4 51.1 45.4  MCV 99.4  --  100.4* 101.4* 99.8  PLT 117*  --  99* 105* 109*   BNP: BNP (last 3 results) Recent Labs    12/07/22 1836  BNP 533.6*     IMAGING STUDIES VAS [place] LOWER EXTREMITY VENOUS (DVT)  Result Date: 12/09/2022  Lower Venous DVT Study Patient Name:  Dennis Swanson  Date of Exam:   12/09/2022 Medical Rec #: [id number]            Accession #:    [id number] Date of Birth: 26-Sep-1955             Patient Gender: M Patient Age:   32 years Exam Location:  [facility] Procedure:      VAS [place] LOWER EXTREMITY VENOUS (DVT) Referring  Phys: ANAND HONGALGI --------------------------------------------------------------------------------  Indications: Swelling.  Risk Factors: None identified. Limitations: Body habitus and poor ultrasound/tissue interface. Comparison Study: No prior studies. Performing Technologist: Chanda Busing RVT  Examination Guidelines: A complete evaluation includes B-mode imaging, spectral Doppler, color Doppler, and power Doppler as needed of all accessible portions of each vessel. Bilateral testing is considered an integral part of a complete examination. Limited examinations for reoccurring indications may be performed as noted. The reflux portion of the exam is performed with the patient in reverse Trendelenburg.  +---------+---------------+---------+-----------+----------+--------------+ RIGHT    CompressibilityPhasicitySpontaneityPropertiesThrombus Aging +---------+---------------+---------+-----------+----------+--------------+ CFV      Full           Yes      No                                  +---------+---------------+---------+-----------+----------+--------------+ SFJ      Full                                                        +---------+---------------+---------+-----------+----------+--------------+ FV Prox  Full                                                        +---------+---------------+---------+-----------+----------+--------------+ FV Mid   Full                                                        +---------+---------------+---------+-----------+----------+--------------+ FV DistalFull                                                        +---------+---------------+---------+-----------+----------+--------------+ PFV      Full                                                        +---------+---------------+---------+-----------+----------+--------------+

## 2022-12-11 NOTE — Progress Notes (Signed)
Rounding Note    Patient Name: Dennis Swanson Date of Encounter: 12/11/2022  [facility] Cardiologist: Dina Rich, MD   Subjective   I/O's incomplete, recorded as -500 cc.  Renal function stable (creatinine 1.65 > 1.37 > 1.19 >1.35 >1.35).  Bicarb up to 44.  BP 140/95 this morning.    Inpatient Medications    Scheduled Meds:  apixaban  5 mg Oral BID   carvedilol  3.125 mg Oral BID WC   dapagliflozin propanediol  10 mg Oral Daily   folic acid  1 mg Oral Daily   ipratropium-albuterol  3 mL Nebulization BID   multivitamin with minerals  1 tablet Oral Daily   pantoprazole  40 mg Oral QAC breakfast   sodium chloride flush  3 mL Intravenous Q12H   thiamine  100 mg Oral Daily   Or   thiamine  100 mg Intravenous Daily   torsemide  20 mg Oral Daily   Continuous Infusions:  PRN Meds: acetaminophen, oxyCODONE **AND** acetaminophen, albuterol, ondansetron (ZOFRAN) IV, sodium chloride flush   Vital Signs    Vitals:   12/11/22 0359 12/11/22 0614 12/11/22 0728 12/11/22 0919  BP:      Pulse:  78  84  Resp:  20    Temp:    98.1 F (36.7 C)  TempSrc:    Oral  SpO2: 96% 97% 95%   Weight:  122.5 kg    Height:        Intake/Output Summary (Last 24 hours) at 12/11/2022 0925 Last data filed at 12/11/2022 0620 Gross per 24 hour  Intake 590 ml  Output 1350 ml  Net -760 ml      12/11/2022    6:14 AM 12/10/2022    3:04 AM 12/09/2022    5:51 AM  Last 3 Weights  Weight (lbs) 270 lb 1.6 oz 271 lb 3.2 oz 271 lb 4.8 oz  Weight (kg) 122.517 kg 123.016 kg 123.061 kg      Telemetry    Off tele this morning- Personally Reviewed  ECG    No new ECG - Personally Reviewed  Physical Exam   GEN: No acute distress.   Neck: + JVD Cardiac: irregular, normal rate, no murmurs Respiratory: Clear to auscultation bilaterally. GI: Soft, nontender MS: trace edema; No deformity. Neuro:  Nonfocal  Psych: Normal affect   Labs    High Sensitivity Troponin:    Recent Labs  Lab 12/07/22 1242 12/07/22 1836  TROPONINIHS 31* 30*     Chemistry Recent Labs  Lab 12/07/22 1242 12/07/22 1258 12/09/22 0654 12/09/22 1529 12/10/22 0409 12/11/22 0418  NA 143   < > 141  --  139 141  K 4.3   < > 4.3  --  4.4 4.1  CL 95*   < > 93*  --  90* 89*  CO2 40*   < > 36*  --  41* 44*  GLUCOSE 99   < > 92  --  111* 98  BUN 29*   < > 23  --  16 16  CREATININE 1.65*   < > 1.19  --  1.35* 1.35*  CALCIUM 8.9   < > 8.6*  --  8.7* 8.8*  MG 1.8  --   --  1.9 1.8 1.9  PROT 6.3*  --   --   --   --   --   ALBUMIN 3.4*  --   --   --   --   --  AST 20  --   --   --   --   --   ALT 20  --   --   --   --   --   ALKPHOS 45  --   --   --   --   --   BILITOT 0.8  --   --   --   --   --   GFRNONAA 45*   < > >60  --  57* 57*  ANIONGAP 8   < > 12  --  8 8   < > = values in this interval not displayed.    Lipids No results for input(s): "CHOL", "TRIG", "HDL", "LABVLDL", "LDLCALC", "CHOLHDL" in the last 168 hours.  Hematology Recent Labs  Lab 12/08/22 0914 12/09/22 0328 12/10/22 0409  WBC 4.3 4.9 5.0  RBC 4.92 5.04 4.55  HGB 14.9 15.2 13.7  HCT 49.4 51.1 45.4  MCV 100.4* 101.4* 99.8  MCH 30.3 30.2 30.1  MCHC 30.2 29.7* 30.2  RDW 14.3 14.1 13.7  PLT 99* 105* 109*   Thyroid No results for input(s): "TSH", "FREET4" in the last 168 hours.  BNP Recent Labs  Lab 12/07/22 1836  BNP 533.6*    DDimer No results for input(s): "DDIMER" in the last 168 hours.   Radiology    VAS [place] LOWER EXTREMITY VENOUS (DVT)  Result Date: 12/09/2022  Lower Venous DVT Study Patient Name:  Dennis Swanson  Date of Exam:   12/09/2022 Medical Rec #: [id number]            Accession #:    [id number] Date of Birth: [birth year]             Patient Gender: M Patient Age:   67 years Exam Location:  [facility] Procedure:      VAS [place] LOWER EXTREMITY VENOUS (DVT) Referring Phys: ANAND HONGALGI --------------------------------------------------------------------------------   Indications: Swelling.  Risk Factors: None identified. Limitations: Body habitus and poor ultrasound/tissue interface. Comparison Study: No prior studies. Performing Technologist: Chanda Busing RVT  Examination Guidelines: A complete evaluation includes B-mode imaging, spectral Doppler, color Doppler, and power Doppler as needed of all accessible portions of each vessel. Bilateral testing is considered an integral part of a complete examination. Limited examinations for reoccurring indications may be performed as noted. The reflux portion of the exam is performed with the patient in reverse Trendelenburg.  +---------+---------------+---------+-----------+----------+--------------+ RIGHT    CompressibilityPhasicitySpontaneityPropertiesThrombus Aging +---------+---------------+---------+-----------+----------+--------------+ CFV      Full           Yes      No                                  +---------+---------------+---------+-----------+----------+--------------+ SFJ      Full                                                        +---------+---------------+---------+-----------+----------+--------------+ FV Prox  Full                                                        +---------+---------------+---------+-----------+----------+--------------+  FV Mid   Full                                                        +---------+---------------+---------+-----------+----------+--------------+ FV DistalFull                                                        +---------+---------------+---------+-----------+----------+--------------+ PFV      Full                                                        +---------+---------------+---------+-----------+----------+--------------+ POP      Full           Yes      No                                  +---------+---------------+---------+-----------+----------+--------------+ PTV      Full                                                         +---------+---------------+---------+-----------+----------+--------------+ PERO     Full                                                        +---------+---------------+---------+-----------+----------+--------------+   +---------+---------------+---------+-----------+----------+-------------------+ LEFT     CompressibilityPhasicitySpontaneityPropertiesThrombus Aging      +---------+---------------+---------+-----------+----------+-------------------+ CFV      Full           Yes      No                                       +---------+---------------+---------+-----------+----------+-------------------+ SFJ      Full                                                             +---------+---------------+---------+-----------+----------+-------------------+ FV Prox  Full                                                             +---------+---------------+---------+-----------+----------+-------------------+ FV Mid   Full                                                             +---------+---------------+---------+-----------+----------+-------------------+  FV Distal               Yes      No                                       +---------+---------------+---------+-----------+----------+-------------------+ PFV      Full                                                             +---------+---------------+---------+-----------+----------+-------------------+ POP      Full           Yes      No                                       +---------+---------------+---------+-----------+----------+-------------------+ PTV      Full                                                             +---------+---------------+---------+-----------+----------+-------------------+ PERO                                                  Not well visualized  +---------+---------------+---------+-----------+----------+-------------------+     Summary: RIGHT: - There is no evidence of deep vein thrombosis in the lower extremity. However, portions of this examination were limited- see technologist comments above.  - No cystic structure found in the popliteal fossa.  LEFT: - There is no evidence of deep vein thrombosis in the lower extremity. However, portions of this examination were limited- see technologist comments above.  - No cystic structure found in the popliteal fossa.  *See table(s) above for measurements and observations. Electronically signed by Coral Else MD on 12/09/2022 at 6:24:30 PM.    Final     Cardiac Studies     Patient Profile     67 y.o. male wit history of chronic diastolic heart failure, pulmonary hypertension, paroxysmal atrial fibrillation, PE, hypertension who we are consulted for evaluation of heart failure  Assessment & Plan    Acute on chronic diastolic heart failure/RV failure: Echocardiogram shows EF 50 to 55%, severe RV dysfunction.   -Volume status has improved, continues to have JVD on exam but otherwise appears euvolemic, suspect JVD is due to severe TR. Converted to p.o. torsemide yesterday -Renal function stable but rising bicarb, will give dose of Diamox today -Continue farxiga 10 mg daily  Atrial fibrillation: Likely permanent.  Continue Eliquis for anticoagulation.  Continue carvedilol, rates controlled  AKI: Creatinine 1.65 upon admission.  Has improved with diuresis, Cr now 1.35. Likely cardiorenal syndrome  [facility] will sign off.   Medication Recommendations: Carvedilol 3.125 mg twice daily, Eliquis 5 mg twice daily, torsemide 20 mg daily, Farxiga 10 mg daily Other recommendations (labs, testing, etc): BMET in 1 week Follow up as an outpatient:  Has follow-up scheduled 10/31    For questions or updates, please contact [facility] Please consult [email or web address] for contact info  under        Signed, Little Ishikawa, MD  12/11/2022, 9:25 AM

## 2022-12-11 NOTE — Progress Notes (Signed)
Mobility Specialist Progress Note:   12/11/22 1113  Mobility  Activity Ambulated with assistance in hallway  Level of Assistance Standby assist, set-up cues, supervision of patient - no hands on  Assistive Device None  Distance Ambulated (ft) 450 ft  Activity Response Tolerated well  Mobility Referral Yes  $Mobility charge 1 Mobility  Mobility Specialist Start Time (ACUTE ONLY) 1010  Mobility Specialist Stop Time (ACUTE ONLY) 1025  Mobility Specialist Time Calculation (min) (ACUTE ONLY) 15 min   Pre Mobility: 78 HR ,  97% SpO2 3 L During Mobility: 109 HR , 96% SpO2 3 L Post Mobility: 87 HR , 100% SpO2 3 L  Pt received in bed, agreeable to mobility. Standing rest break required d/t fatigue. Pt denied any other discomfort, asymptomatic throughout. Pt returned to bed with call bell in reach and all needs met.   Leory Plowman  Mobility Specialist Please contact via [employer] office at [phone]

## 2022-12-17 ENCOUNTER — Ambulatory Visit (HOSPITAL_COMMUNITY)
Admit: 2022-12-17 | Discharge: 2022-12-17 | Disposition: A | Discharge status: Home / Self Care | Payer: 59 | Source: Ambulatory Visit | Attending: Physician Assistant | Admitting: Physician Assistant

## 2022-12-17 ENCOUNTER — Other Ambulatory Visit (HOSPITAL_COMMUNITY): Payer: Self-pay | Admitting: Cardiology

## 2022-12-17 ENCOUNTER — Other Ambulatory Visit (HOSPITAL_COMMUNITY): Payer: Self-pay | Admitting: *Deleted

## 2022-12-17 ENCOUNTER — Encounter (HOSPITAL_COMMUNITY): Payer: Self-pay

## 2022-12-17 VITALS — BP 128/80 | HR 79 | Ht 72.0 in | Wt 269.8 lb

## 2022-12-17 DIAGNOSIS — R9431 Abnormal electrocardiogram [ECG] [EKG]: Secondary | ICD-10-CM | POA: Insufficient documentation

## 2022-12-17 DIAGNOSIS — R0683 Snoring: Secondary | ICD-10-CM | POA: Insufficient documentation

## 2022-12-17 DIAGNOSIS — I272 Pulmonary hypertension, unspecified: Secondary | ICD-10-CM | POA: Insufficient documentation

## 2022-12-17 DIAGNOSIS — I110 Hypertensive heart disease with heart failure: Secondary | ICD-10-CM | POA: Insufficient documentation

## 2022-12-17 DIAGNOSIS — F1010 Alcohol abuse, uncomplicated: Secondary | ICD-10-CM | POA: Diagnosis not present

## 2022-12-17 DIAGNOSIS — Z556 Problems related to health literacy: Secondary | ICD-10-CM | POA: Diagnosis not present

## 2022-12-17 DIAGNOSIS — I5022 Chronic systolic (congestive) heart failure: Secondary | ICD-10-CM | POA: Diagnosis not present

## 2022-12-17 DIAGNOSIS — Z7901 Long term (current) use of anticoagulants: Secondary | ICD-10-CM | POA: Insufficient documentation

## 2022-12-17 DIAGNOSIS — Z91148 Patient's other noncompliance with medication regimen for other reason: Secondary | ICD-10-CM | POA: Diagnosis not present

## 2022-12-17 DIAGNOSIS — I50810 Right heart failure, unspecified: Secondary | ICD-10-CM

## 2022-12-17 DIAGNOSIS — I4821 Permanent atrial fibrillation: Secondary | ICD-10-CM | POA: Insufficient documentation

## 2022-12-17 DIAGNOSIS — R5383 Other fatigue: Secondary | ICD-10-CM | POA: Insufficient documentation

## 2022-12-17 DIAGNOSIS — I10 Essential (primary) hypertension: Secondary | ICD-10-CM

## 2022-12-17 LAB — COMPREHENSIVE METABOLIC PANEL
ALT: 17 U/L (ref 0–44)
AST: 18 U/L (ref 15–41)
Albumin: 3.3 g/dL — ABNORMAL LOW (ref 3.5–5.0)
Alkaline Phosphatase: 51 U/L (ref 38–126)
Anion gap: 7 (ref 5–15)
BUN: 11 mg/dL (ref 8–23)
CO2: 43 mmol/L — ABNORMAL HIGH (ref 22–32)
Calcium: 8.7 mg/dL — ABNORMAL LOW (ref 8.9–10.3)
Chloride: 92 mmol/L — ABNORMAL LOW (ref 98–111)
Creatinine, Ser: 1.15 mg/dL (ref 0.61–1.24)
GFR, Estimated: >60 mL/min (ref >60)
Glucose, Bld: 98 mg/dL (ref 70–99)
Potassium: 4 mmol/L (ref 3.5–5.1)
Sodium: 142 mmol/L (ref 135–145)
Total Bilirubin: 0.8 mg/dL (ref 0.3–1.2)
Total Protein: 6.2 g/dL — ABNORMAL LOW (ref 6.5–8.1)

## 2022-12-17 LAB — BRAIN NATRIURETIC PEPTIDE: B Natriuretic Peptide: 311.3 pg/mL — ABNORMAL HIGH (ref 0.0–100.0)

## 2022-12-17 MED ORDER — APIXABAN 5 MG PO TABS: 5.0000 mg | ORAL_TABLET | Freq: Two times a day (BID) | ORAL | 6 refills | Status: DC

## 2022-12-17 MED ORDER — DAPAGLIFLOZIN PROPANEDIOL 10 MG PO TABS: 10.0000 mg | ORAL_TABLET | Freq: Every day | ORAL | 3 refills | Status: DC

## 2022-12-17 MED ORDER — CARVEDILOL 3.125 MG PO TABS: 3.1250 mg | ORAL_TABLET | Freq: Two times a day (BID) | ORAL | 3 refills | Status: DC

## 2022-12-17 MED ORDER — POTASSIUM CHLORIDE CRYS ER 20 MEQ PO TBCR: 20.0000 meq | EXTENDED_RELEASE_TABLET | Freq: Every day | ORAL | 3 refills | Status: DC

## 2022-12-17 MED ORDER — PANTOPRAZOLE SODIUM 40 MG PO TBEC: 40.0000 mg | DELAYED_RELEASE_TABLET | Freq: Every day | ORAL | 3 refills | Status: DC

## 2022-12-17 MED ORDER — THIAMINE HCL 100 MG PO TABS: 100.0000 mg | ORAL_TABLET | Freq: Every day | ORAL | 3 refills | Status: AC

## 2022-12-17 MED ORDER — ENTRESTO 24-26 MG PO TABS: 1.0000 | ORAL_TABLET | Freq: Two times a day (BID) | ORAL | 3 refills | Status: DC

## 2022-12-17 MED ORDER — APIXABAN 5 MG PO TABS: 5.0000 mg | ORAL_TABLET | Freq: Two times a day (BID) | ORAL | 3 refills | Status: DC

## 2022-12-17 MED ORDER — TORSEMIDE 20 MG PO TABS: 20.0000 mg | ORAL_TABLET | Freq: Every day | ORAL | 3 refills | Status: DC

## 2022-12-17 NOTE — Telephone Encounter (Signed)
[facility] called to request refill of eliquis. Meds not in the home to fill for weekly med box

## 2022-12-17 NOTE — Progress Notes (Signed)
HEART & VASCULAR TRANSITION OF CARE CONSULT NOTE     Referring Physician: Dr. Rito Ehrlich Primary Care: Dr. Sherryll Burger Primary Cardiologist: Dr. Wyline Mood  HPI: Referred to clinic by Dr. Rito Ehrlich for heart failure consultation. 67 y.o. male with history of permanent atrial fibrillation, HFmrEF with recovered LV function but persistently reduced RV function, hx PE, HTN, HLD, hx ETOH abuse, obesity.   He has a history of CHF dating back to 2011. EF 40-45% at that time. In 2017, EF was 60-65% on echo.   Echo 07/22: EF 45-50%, moderate LVH, interventricular septum flattened in systole and diastole consistent with RV pressure and volume overload, RV severely enlarged with severely reduced function, RVSP 57 mmHg, severe TR, dilated IVC  Echo 11/23: EF 50-55%, RV severely reduced, severe BAE  He has been followed by Dr. Wyline Mood. Sleep study has previously been recommended but not completed by patient. No evidence of PE on V/Q scan 06/24. PFTs 10/20/22: FEV1 0.99, FEV1/FVC ratio 60%, severe obstructive airway disease and mild diffusion defect  He was admitted 12/07/22 with suspected acute on chronic hypoxic and hypercapnic respiratory failure requiring BiPAP 2/2 acute on chronic CHF. VBG on admission with pH 7.26, CO2 111. He had been eating a lot of salt. Reported medication compliance.  Cardiology consulted. Echo with EF 50-55%, moderate LVH, RV pressure and volume overload, RV severely enlarged with severely reduced function, RVSP 71 mmHg, severe RAE, severe TR.  He was diuresed with IV lasix and GDMT titrated.  Here today for hospital follow-up. He states have been more swollen the last few days. Reports he went to a cookout yesterday and had frog legs and ham. Does not use a salt shaker but eats canned vegetables. His home weight today was 263 lb, states he has lost a few lbs since discharge but cannot elaborate further. Reports some dyspnea with exertion and has been using 2L O2 since discharge. Notes  chronic orthopnea. Occasionally wakes himself up snoring and is fatigued during the day.   His niece and sister help him with medications but they are not able to see him every day. His niece was called by clinic RN and states he has not been taking Eliquis or Torsemide. He does not know his medications.   Denies tobacco use. Reports consuming 1 shot of liquor a week.    Past Medical History:  Diagnosis Date   Acute pulmonary embolism (HCC) 02/16/2009   Alcohol abuse    Atrial flutter (HCC)    CHF (congestive heart failure) (HCC)    a. EF 40-45% in 2011 b. EF at 60-65% in 04/2015 c. EF at 45-50% by echo in 09/2020   Dysrhythmia    Hypertension    Lower extremity cellulitis    bilateral   Systolic dysfunction     Current Outpatient Medications  Medication Sig Dispense Refill   carvedilol (COREG) 3.125 MG tablet Take 1 tablet (3.125 mg total) by mouth 2 (two) times daily with a meal. 60 tablet 1   dapagliflozin propanediol (FARXIGA) 10 MG TABS tablet Take 1 tablet (10 mg total) by mouth daily. 30 tablet 1   ENTRESTO 24-26 MG Take 1 tablet by mouth twice daily 180 tablet 0   oxyCODONE-acetaminophen (PERCOCET) 10-325 MG tablet Take 1 tablet by mouth every 6 (six) hours as needed for pain.     pantoprazole (PROTONIX) 40 MG tablet TAKE 1 TABLET BY MOUTH ONCE DAILY BEFORE BREAKFAST 90 tablet 3   thiamine (VITAMIN B1) 100 MG tablet Take 1  tablet (100 mg total) by mouth daily. 30 tablet 0   apixaban (ELIQUIS) 5 MG TABS tablet Take 1 tablet (5 mg total) by mouth 2 (two) times daily. (Patient not taking: Reported on 12/17/2022) 60 tablet 1   naloxone (NARCAN) nasal spray 4 mg/0.1 mL  (Patient not taking: Reported on 12/17/2022)     torsemide (DEMADEX) 20 MG tablet Take 1 tablet (20 mg total) by mouth daily. (Patient not taking: Reported on 12/17/2022) 30 tablet 1   No current facility-administered medications for this encounter.    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  Tobacco Use   Smoking status: Never   Smokeless tobacco: Never  Vaping Use   Vaping status: Never Used  Substance and Sexual Activity   Alcohol use: Yes    Alcohol/week: 3.0 standard drinks of alcohol    Types: 3 Cans of beer per week    Comment: 1-2 beer a week   Drug use: No   Sexual activity: Not Currently  Other Topics Concern   Not on file  Social History Narrative   Lives alone, 2 daughters   No regular exercise   Social Determinants of Health   Financial Resource Strain: Low Risk  (12/09/2022)   Overall Financial Resource Strain (CARDIA)    Difficulty of Paying Living Expenses: Not hard at all  Food Insecurity: No Food Insecurity (12/07/2022)   Hunger Vital Sign    Worried About Running Out of Food in the Last Year: Never true    Ran Out of Food in the Last Year: Never true  Transportation Needs: No Transportation Needs (12/09/2022)   PRAPARE - Administrator, Civil Service (Medical): No    Lack of Transportation (Non-Medical): No  Physical Activity: Inactive (10/02/2020)   Exercise Vital Sign    Days of Exercise per Week: 0 days    Minutes of Exercise per Session: 0 min  Stress: Not on file  Social Connections: Not on file  Intimate Partner Violence: Not At Risk (12/07/2022)   Humiliation, Afraid, Rape, and Kick questionnaire    Fear of Current or Ex-Partner: No    Emotionally Abused: No    Physically Abused: No    Sexually Abused: No      Family History  Problem Relation Age of Onset   Heart attack Mother    Hypertension Brother    Diabetes Brother    Cancer Father    Colon cancer Neg Hx     Vitals:   12/17/22 0906  BP: 128/80  Pulse: 79  SpO2: 97%  Weight: 122.4 kg (269 lb 12.8 oz)   Height: 6' (1.829 m)    PHYSICAL EXAM: General:  Well appearing. Ambulated into clinic. HEENT: normal Neck: supple. Thick neck.  Cor: PMI nondisplaced. Irregular rhythm. No rubs, gallops or murmurs. Lungs: clear Abdomen: obese, soft, nontender, + distended.  Extremities: no cyanosis, clubbing, rash, 2-3 + edema to thighs Neuro: alert & oriented x 3, cranial nerves grossly intact. moves all 4 extremities w/o difficulty. Affect pleasant.  ECG: Atrial fibrillation 77 bpm   ASSESSMENT & PLAN: HFmrEF with recovered EF and prominent RV failure: -EF has predominately varied between 40-45% to 50-55% on  echoes. RV dysfunction initially noted back in 2022.  -Last echo 10/24: EF 50-55%, moderate LVH, RV pressure and volume overload, RV severely enlarged with severely reduced function, RVSP 71 mmHg, severe RAE, severe TR -NYHA III. He is volume overloaded. Has not been taking diuretic as prescribed and is eating a lot of salt. Has poor health literacy and does not seem to understand his medications. Will refer for [facility]. Restart Torsemide at 40 mg daily X 2 days then decrease to 20 mg daily. Start KCL 20 mEq daily.  -Gifted a pill box in clinic today -Continue Farxiga 10 mg daily -Continue Entresto 24/26 mg BID -Continue Coreg 3.125 mg BID -Could consider spiro next -CMET/BNP today, repeat BMET in about 10 days  2. Pulmonary hypertension: -RVSP 71 on most recent echo 10/24. RV severely reduced.  -? Predominately WHO groups II & III -May need RHC -Has hx of PE in 2011. V/Q scan in 06/24 negative for PE. -PFTs 09/24 with FEV1 0.99, FEV1/FVC ratio 60%, no significant response to bronchodilators. Has f/u with Pulmonary next month. -CO2 chronically elevated. Suspect significant OSA/OHS. Has not completed home sleep study. Will refer for in-lab study.  3. HTN -BP stable -Meds as above  4. Permanent atrial fibrillation: -Has been in atrial fibrillation at least since  2018 -Rate controlled -Restart Eliquis 5 mg BID  5. ETOH abuse: -Reports drinking 1 shot a week -Discussed limiting intake  6. Snoring/daytime fatigue: -In-lab sleep study ordered  Plan of care reviewed with his niece over the phone.   Referred to HFSW (PCP, Medications, Transportation, ETOH Abuse, Drug Abuse, Insurance, Financial ): Yes, referral to Paramedicine Refer to Pharmacy: No Refer to Home Health: No Refer to Advanced Heart Failure Clinic: Yes Refer to General Cardiology: No, already established  Follow up  Establish with Dr. Gala Romney in 3 weeks, will follow along with Cardiology.

## 2022-12-17 NOTE — Progress Notes (Signed)
H&V Care Navigation CSW Progress Note  Clinical Social Worker consulted to assist with care plan for patient to help with compliance at home.  Patient does not seem to have good understanding of medications he is taking and has not been taking things as prescribed.  Lives alone but has niece who helps him when she can.  Patient agreeable to [facility]- they will try to see patient today.  Patient also agreeable to referral to [facility] to see if he would qualify for an aid.  Referral faxed in for review.   SDOH Screenings   Food Insecurity: No Food Insecurity (12/07/2022)  Housing: Low Risk  (12/09/2022)  Transportation Needs: No Transportation Needs (12/09/2022)  Utilities: Not At Risk (12/07/2022)  Alcohol Screen: Low Risk  (12/09/2022)  Financial Resource Strain: Low Risk  (12/09/2022)  Physical Activity: Inactive (10/02/2020)  Tobacco Use: Low Risk  (12/17/2022)   Burna Sis, LCSW Clinical Social Worker [facility] Desk#: [phone] Cell#: [phone]

## 2022-12-17 NOTE — Patient Instructions (Signed)
Start Eliquis (blood thinner) 5 mg twice daily Take Torsemide (water pill) 40 mg (2 pills) for two days; then take 20 mg (1 pill) daily until next visit. Start potassium 20 meq daily (new Rx sent to your local pharmacy) Repeat labs in 10 days - may go to [facility] to have these done (we have ordered) Referral sent for in-lab sleep study. We will call you to schedule  Referral sent for [facility] - they will call to schedule your first home visit Return to see Dr. Gala Romney in [facility] (here) on 01/12/23 - see below 8.   Labs today - we will call you if abnormal.   PLEASE CALL us AT [phone] IF ANY QUESTIONS OR CONCERNS PRIOR TO YOUR NEXT VISIT

## 2022-12-23 ENCOUNTER — Other Ambulatory Visit: Payer: Self-pay | Admitting: Cardiology

## 2022-12-28 ENCOUNTER — Other Ambulatory Visit (HOSPITAL_COMMUNITY): Payer: Self-pay

## 2023-01-06 ENCOUNTER — Ambulatory Visit (INDEPENDENT_AMBULATORY_CARE_PROVIDER_SITE_OTHER): Payer: 59 | Admitting: Internal Medicine

## 2023-01-06 ENCOUNTER — Encounter: Payer: Self-pay | Admitting: Internal Medicine

## 2023-01-06 VITALS — BP 118/69 | HR 88 | Ht 72.0 in | Wt 258.0 lb

## 2023-01-06 DIAGNOSIS — R0609 Other forms of dyspnea: Secondary | ICD-10-CM | POA: Insufficient documentation

## 2023-01-06 DIAGNOSIS — J9601 Acute respiratory failure with hypoxia: Secondary | ICD-10-CM | POA: Diagnosis not present

## 2023-01-06 DIAGNOSIS — J4489 Other specified chronic obstructive pulmonary disease: Secondary | ICD-10-CM | POA: Insufficient documentation

## 2023-01-06 MED ORDER — TRELEGY ELLIPTA 100-62.5-25 MCG/ACT IN AEPB: 1.0000 | INHALATION_SPRAY | Freq: Every day | RESPIRATORY_TRACT | Status: DC

## 2023-01-06 NOTE — Assessment & Plan Note (Signed)
Never regular smoker - PFT's  10/20/22  FEV1 1.16 (32 % ) ratio 0.65 (LLN 0.64 )  p 17 % improvement from saba p 0 prior to study with DLCO  19.9 (71%)   and FV curve mildly concave and ERV 24 at wt 255    - 01/06/2023  After extensive coaching inhaler device,  effectiveness =    75% with dpi> try trelegy 100 one click each am   The asthmatic component is present minimal and he's never smoked but may have enough airflow obst to  benefit from maint rx :  sample of trelegy 100 x 6 weeks supplied and f/u then

## 2023-01-06 NOTE — Patient Instructions (Addendum)
Oxygen:  2lpm sleeping, 2lpm sitting, 3 lpm walking  and Make sure you check your oxygen saturation  AT  your highest level of activity (not after you stop)   to be sure it stays over 90% and adjust  02 flow upward to maintain this level if needed but remember to turn it back to previous settings when you stop (to conserve your supply).   My office will be contacting you by phone for referral for ONO  on 2lpm 0xygen   - if you don't hear back from my office within one week please call us back or notify us thru MyChart and we'll address it right away.   Trelegy 100  x  1 click 1st thing in am and take at least two good forceful drags  Work on inhaler technique:  relax and gently blow all the way out then take a nice smooth full deep breath back in,  Hold breath in for at least  5 seconds if you can. Blow out trelegy  thru nose. Rinse and gargle with water when done.  If mouth or throat bother you at all,  try brushing teeth/gums/tongue with arm and hammer toothpaste/ make a slurry and gargle and spit out.   Please remember to go to the lab department   for your tests - we will call you with the results when they are available.     Please schedule a follow up office visit in 6 weeks, call sooner if needed

## 2023-01-06 NOTE — Progress Notes (Signed)
Dennis Swanson, male    DOB: 1955/05/07    MRN: [id number]   Brief patient profile:  13  yobm  never smoker with onset sob age 67   Dennis Swanson pt self referred back to pulmonary clinic in [place]  01/06/2023    Dennis Swanson rec 09/02/12  HST > not done  PFTs severe airflow obst with reversible component.   Admit date: 12/07/2022 Discharge date: 12/11/2022     PCP: Dennis Peri, MD   DISCHARGE DIAGNOSES:    Acute respiratory failure with hypoxia (HCC)   AKI (acute kidney injury) [facility])   Essential hypertension   Long term current use of anticoagulant   Atrial fibrillation (HCC)   History of pulmonary embolism   Obesity (BMI 30-39.9)   Acute on chronic diastolic heart failure (HCC)   RVF (right ventricular failure) (HCC)     RECOMMENDATIONS FOR OUTPATIENT FOLLOW UP: Home oxygen has been ordered Cardiology to schedule outpatient follow-up Patient to follow-up with his pulmonologist       Home Health: None Equipment/Devices: Home oxygen   CODE STATUS: Full code   DISCHARGE CONDITION: fair   Diet recommendation: Heart healthy   INITIAL HISTORY: 67 year old male, lives alone, independent, medical history significant for chronic systolic CHF, A-fib, pulmonary embolism on Eliquis, HTN, HLD, obesity, chronic iron deficiency anemia, alcohol use disorder, who presented to the ED on 12/07/2022 with complaints of progressive dyspnea on exertion with minimal activity, progressive lower extremity edema, weight gain possibly up to 30 pounds, abdominal distention but no chest pain. Reports dietary indiscretion and eating salty food and consuming alcohol. In the ED, oxygen saturations in the 79% on room air, required 6 L/min [place] oxygen to bring up saturations to 92%. Overnight of admission, required BiPAP for acute on chronic hypoxic and hypercapnic respiratory failure. Admitted for acute on chronic diastolic CHF, acute on chronic hypoxic and hypercapnic respiratory failure.      HOSPITAL  COURSE:    Acute on suspected chronic hypoxic and hypercapnic respiratory failure Multifactorial due to decompensated CHF complicating underlying suspected OSA/OHS, ?? COPD/asthma. VBG on admission: pH 7.26, pCO2 111, pO2 <31. Presented with oxygen saturation of 79% on room air on admission. Patient required BiPAP in the night of admission but then transitioned to nasal cannula. Has seen pulmonology in July 2024, assessed to have biventricular heart failure, remote PE, negative collagen studies, non-smoker, planned for home sleep testing.  Will allow this to be pursued further in the outpatient setting.  Respiratory status is stable for the most part.  Home oxygen has been ordered.   Acute on chronic diastolic CHF Echocardiogram  EF 50 to 55%.  Moderate LVH was noted.  Previously he has had a EF as low as 40 to 45%. HS Troponin 31 > 30, minimally elevated, flat trend, no chest pain, suspect demand ischemia. Given IV furosemide.  Transition to oral diuretics. Entresto was placed on hold.  Was not supposed to be discharged on this.  However looks like this has been resumed by the pharmacist at discharge.  Not clear who directed this pharmacist to resume this medication.   A-fib with controlled ventricular rate: Continue carvedilol and apixaban.   Acute kidney injury complicating stage II CKD/hyperkalemia Last known creatinine 1.17 in May 23.   Presented with creatinine of 1.65,?  Cardiorenal syndrome Renal function stable for the most part.    Hypernatremia, mild Sodium level has improved   Thrombocytopenia Stable.   Essential hypertension   History of pulmonary  embolism Continue Eliquis.  No DVT on Doppler studies.   Alcohol use disorder Does not appear to be forthcoming about the exact amount of alcohol he consumes. CIWA protocol. No evidence for withdrawal currently.   Suspected sleep apnea Outpatient follow-up with pulmonology for sleep study. CPAP at bedtime   Aortic  dilatation Noted on echocardiogram.  Outpatient monitoring.   Pulmonary hypertension Noted on echocardiogram.  RV function is reduced.  Seen on previous echo as well.  Outpatient follow-up with pulmonology.   Chronic pain Continue prior home dose of opioids.     Body mass index is 36.78 kg/m./Obesity Lifestyle modifications and weight loss as outpatient.     History of Present Illness  01/06/2023  Pulmonary/ 1st office eval/ Dennis Swanson / Dennis Swanson Office new start last admit  Chief Complaint  Patient presents with   Hospitalization Follow-up  Dyspnea:  baseline still  doing [employer],  several aisles s stopping and s 02 and has not resumed since d/c Cough: none  Sleep: bed is flat/ 2 pillows  SABA use: none  02: 2lpm hs / prn daytime but not titrating as does not have pulse ox   No obvious day to day or daytime pattern/variability or assoc excess/ purulent sputum or mucus plugs or hemoptysis or cp or chest tightness, subjective wheeze or overt sinus or hb symptoms.    Also denies any obvious fluctuation of symptoms with weather or environmental changes or other aggravating or alleviating factors except as outlined above   No unusual exposure hx or h/o childhood pna/ asthma or knowledge of premature birth.  Current Allergies, Complete Past Medical History, Past Surgical History, Family History, and Social History were reviewed in [employer] record.  ROS  The following are not active complaints unless bolded Hoarseness, sore throat, dysphagia, dental problems, itching, sneezing,  nasal congestion or discharge of excess mucus or purulent secretions, ear ache,   fever, chills, sweats, unintended wt loss or wt gain, classically pleuritic or exertional cp,  orthopnea pnd or arm/hand swelling  or leg swelling/improved, presyncope, palpitations, abdominal pain, anorexia, nausea, vomiting, diarrhea  or change in bowel habits or change in bladder habits, change in stools  or change in urine, dysuria, hematuria,  rash, arthralgias, visual complaints, headache, numbness, weakness or ataxia or problems with walking or coordination,  change in mood or  memory.              Outpatient Medications Prior to Visit  Medication Sig Dispense Refill   apixaban (ELIQUIS) 5 MG TABS tablet Take 1 tablet (5 mg total) by mouth 2 (two) times daily. 180 tablet 3   carvedilol (COREG) 3.125 MG tablet Take 1 tablet (3.125 mg total) by mouth 2 (two) times daily with a meal. 180 tablet 3   dapagliflozin propanediol (FARXIGA) 10 MG TABS tablet Take 1 tablet (10 mg total) by mouth daily. 90 tablet 3   furosemide (LASIX) 40 MG tablet Take 40 mg by mouth 2 (two) times daily.     naloxone (NARCAN) nasal spray 4 mg/0.1 mL      oxyCODONE-acetaminophen (PERCOCET) 10-325 MG tablet Take 1 tablet by mouth every 6 (six) hours as needed for pain.     pantoprazole (PROTONIX) 40 MG tablet Take 1 tablet (40 mg total) by mouth daily before breakfast. 90 tablet 3   potassium chloride SA (KLOR-CON M) 20 MEQ tablet Take 1 tablet (20 mEq total) by mouth daily. 90 tablet 3   sacubitril-valsartan (ENTRESTO) 24-26 MG Take 1  tablet by mouth 2 (two) times daily. 180 tablet 3   thiamine (VITAMIN B1) 100 MG tablet Take 1 tablet (100 mg total) by mouth daily. 90 tablet 3   torsemide (DEMADEX) 20 MG tablet Take 1 tablet (20 mg total) by mouth daily. 90 tablet 3   No facility-administered medications prior to visit.    Past Medical History:  Diagnosis Date   Acute pulmonary embolism (HCC) 02/16/2009   Alcohol abuse    Atrial flutter (HCC)    CHF (congestive heart failure) (HCC)    a. EF 40-45% in 2011 b. EF at 60-65% in 04/2015 c. EF at 45-50% by echo in 09/2020   Dysrhythmia    Hypertension    Lower extremity cellulitis    bilateral   Systolic dysfunction       Objective:     BP 118/69   Pulse 88   Ht 6' (1.829 m)   Wt 258 lb (117 kg)   SpO2 91% Comment: 2L CONT  BMI 34.99 kg/m   SpO2:  91 % (2L CONT)  Pleasant amb bm nad      HEENT : Oropharynx  clear/ poor dentition      Nasal turbinates mild edema    NECK :  without  apparent JVD/ palpable Nodes/TM    LUNGS: no acc muscle use,  Nl contour chest which with trace wheeze on exp bilaterally   CV:  slt irreg S1S2  no s3 or murmur or increase in P2, and no edema   ABD:  soft and nontender with nl inspiratory excursion in the supine position. No bruits or organomegaly appreciated   MS:  Nl gait/ ext warm without deformities Or obvious joint restrictions  calf tenderness, cyanosis or clubbing    SKIN: warm and dry without lesions    NEURO:  alert, approp, nl sensorium with  no motor or cerebellar deficits apparent.    I personally reviewed images and agree with radiology impression as follows:  CXR:   portable 12/07/22  Stable cardiomegaly. Lungs are clear.     Assessment   Asthmatic bronchitis , chronic (HCC) Never regular smoker - PFT's  10/20/22  FEV1 1.16 (32 % ) ratio 0.65 (LLN 0.64 )  p 17 % improvement from saba p 0 prior to study with DLCO  19.9 (71%)   and FV curve mildly concave and ERV 24 at wt 255    - 01/06/2023  After extensive coaching inhaler device,  effectiveness =    75% with dpi> try trelegy 100 one click each am   The asthmatic component is present minimal and he's never smoked but may have enough airflow obst to  benefit from maint rx :  sample of trelegy 100 x 6 weeks supplied and f/u then   Acute hypoxemic respiratory failure (HCC) Started on 2lpm at admit  - sats 83% RA at rest 01/06/2023  - 01/06/2023  83% ROOM AIR; placed on 2L CONT and walked slow paced x 150 ft >  sat 87%, placed on 3L CONT and walked another 150 ft slow paced :   90% on 3L CONT   For now rec 2lpm sitting and sleeping and 3lpm walking but ok to tritrate up if needed with goal > 90% esp in view of PH   >>> rec ONO on 2lpm and low threshold for HST but for now says sleeping well / rested in am s hypersomnia or other  indication for immediate sleep eval.   DOE (dyspnea on  exertion) VQ  08/01/22 nl  Echo 12/08/22 1. Left ventricular ejection fraction, by estimation, is 50 to 55%. Left  ventricular ejection fraction by 3D volume is 58 %. The left ventricle has  low normal function. The left ventricle has no regional wall motion  abnormalities. There is moderate left ventricular hypertrophy. Left ventricular diastolic parameters are  indeterminate. There is the interventricular septum is flattened in  systole and diastole, consistent with right ventricular pressure and  volume overload.   2. Right ventricular systolic function is severely reduced. The right  ventricular size is severely enlarged. There is severely elevated  pulmonary artery systolic pressure. The estimated right ventricular  systolic pressure is 71.0 mmHg.   3. Right atrial size was severely dilated.   4. The mitral valve is normal in structure. Trivial mitral valve  regurgitation. No evidence of mitral stenosis.   5. Tricuspid valve regurgitation is severe.   6. The aortic valve is calcified. There is mild calcification of the  aortic valve. There is mild thickening of the aortic valve. Aortic valve  regurgitation is not visualized. Aortic valve sclerosis is present, with  no evidence of aortic valve stenosis.   7. Aortic dilatation noted. Aneurysm of the ascending aorta, measuring 48  mm. There is moderate dilatation of the ascending aorta, measuring 48 mm.   8. The inferior vena cava is dilated in size with <50% respiratory  variability, suggesting right atrial pressure of 15 mmHg.   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  Total time for  H and P, chart review, counseling, reviewing dpi/02/pulse ox device(s) , directly observing portions of ambulatory 02 saturation study/ and generating customized AVS unique to this office visit / same day charting > 40 min post hosp with pt new to me                   Sandrea Hughs, MD 01/06/2023

## 2023-01-06 NOTE — Assessment & Plan Note (Addendum)
Started on 2lpm at admit  - sats 83% RA at rest 01/06/2023  - 01/06/2023  83% ROOM AIR; placed on 2L CONT and walked slow paced x 150 ft >  sat 87%, placed on 3L CONT and walked another 150 ft slow paced :   90% on 3L CONT   For now rec 2lpm sitting and sleeping and 3lpm walking but ok to tritrate up if needed with goal > 90% esp in view of PH   >>> rec ONO on 2lpm and low threshold for HST but for now says sleeping well / rested in am s hypersomnia or other indication for immediate sleep eval.

## 2023-01-06 NOTE — Assessment & Plan Note (Addendum)
VQ  08/01/22 nl  Echo 12/08/22 1. Left ventricular ejection fraction, by estimation, is 50 to 55%. Left  ventricular ejection fraction by 3D volume is 58 %. The left ventricle has  low normal function. The left ventricle has no regional wall motion  abnormalities. There is moderate left ventricular hypertrophy. Left ventricular diastolic parameters are  indeterminate. There is the interventricular septum is flattened in  systole and diastole, consistent with right ventricular pressure and  volume overload.   2. Right ventricular systolic function is severely reduced. The right  ventricular size is severely enlarged. There is severely elevated  pulmonary artery systolic pressure. The estimated right ventricular  systolic pressure is 71.0 mmHg.   3. Right atrial size was severely dilated.   4. The mitral valve is normal in structure. Trivial mitral valve  regurgitation. No evidence of mitral stenosis.   5. Tricuspid valve regurgitation is severe.   6. The aortic valve is calcified. There is mild calcification of the  aortic valve. There is mild thickening of the aortic valve. Aortic valve  regurgitation is not visualized. Aortic valve sclerosis is present, with  no evidence of aortic valve stenosis.   7. Aortic dilatation noted. Aneurysm of the ascending aorta, measuring 48  mm. There is moderate dilatation of the ascending aorta, measuring 48 mm.   8. The inferior vena cava is dilated in size with <50% respiratory  variability, suggesting right atrial pressure of 15 mmHg.   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  Total time for H and P, chart review, counseling, reviewing dpi/02/pulse ox  device(s) , directly observing portions of ambulatory 02 saturation study/ and generating customized AVS unique to this office visit / same day charting > 40 min post hosp with pt new to me

## 2023-01-09 ENCOUNTER — Encounter: Payer: Self-pay | Admitting: Internal Medicine

## 2023-01-09 DIAGNOSIS — J9611 Chronic respiratory failure with hypoxia: Secondary | ICD-10-CM | POA: Insufficient documentation

## 2023-01-09 LAB — CBC WITH DIFFERENTIAL/PLATELET
Basophils Absolute: 0 10*3/uL (ref 0.0–0.2)
Basos: 1 %
EOS (ABSOLUTE): 0.1 10*3/uL (ref 0.0–0.4)
Eos: 2 %
Hematocrit: 42.9 % (ref 37.5–51.0)
Hemoglobin: 13.5 g/dL (ref 13.0–17.7)
Immature Grans (Abs): 0 10*3/uL (ref 0.0–0.1)
Immature Granulocytes: 0 %
Lymphocytes Absolute: 1 10*3/uL (ref 0.7–3.1)
Lymphs: 21 %
MCH: 29.9 pg (ref 26.6–33.0)
MCHC: 31.5 g/dL (ref 31.5–35.7)
MCV: 95 fL (ref 79–97)
Monocytes Absolute: 0.6 10*3/uL (ref 0.1–0.9)
Monocytes: 12 %
Neutrophils Absolute: 3.1 10*3/uL (ref 1.4–7.0)
Neutrophils: 64 %
Platelets: 139 10*3/uL — ABNORMAL LOW (ref 150–450)
RBC: 4.52 x10E6/uL (ref 4.14–5.80)
RDW: 12.9 % (ref 11.6–15.4)
WBC: 4.7 10*3/uL (ref 3.4–10.8)

## 2023-01-09 LAB — TSH: TSH: 2.5 u[IU]/mL (ref 0.450–4.500)

## 2023-01-09 LAB — BASIC METABOLIC PANEL
BUN/Creatinine Ratio: 20 (ref 10–24)
BUN: 20 mg/dL (ref 8–27)
CO2: 38 mmol/L — ABNORMAL HIGH (ref 20–29)
Calcium: 9.5 mg/dL (ref 8.6–10.2)
Chloride: 95 mmol/L — ABNORMAL LOW (ref 96–106)
Creatinine, Ser: 1.02 mg/dL (ref 0.76–1.27)
Glucose: 74 mg/dL (ref 70–99)
Potassium: 4.4 mmol/L (ref 3.5–5.2)
Sodium: 145 mmol/L — ABNORMAL HIGH (ref 134–144)
eGFR: 80 mL/min/{1.73_m2} (ref >59)

## 2023-01-09 LAB — BRAIN NATRIURETIC PEPTIDE: BNP: 126.7 pg/mL — ABNORMAL HIGH (ref 0.0–100.0)

## 2023-01-09 LAB — IGE: IgE (Immunoglobulin E), Serum: 113 [IU]/mL (ref 6–495)

## 2023-01-12 ENCOUNTER — Encounter: Payer: Self-pay | Admitting: Student

## 2023-01-12 ENCOUNTER — Encounter (HOSPITAL_COMMUNITY): Payer: 59 | Admitting: Internal Medicine

## 2023-01-12 ENCOUNTER — Ambulatory Visit: Payer: 59 | Attending: Student | Admitting: Student

## 2023-01-12 VITALS — BP 128/82 | HR 80 | Ht 72.0 in | Wt 256.0 lb

## 2023-01-12 DIAGNOSIS — I272 Pulmonary hypertension, unspecified: Secondary | ICD-10-CM | POA: Diagnosis not present

## 2023-01-12 DIAGNOSIS — I5022 Chronic systolic (congestive) heart failure: Secondary | ICD-10-CM | POA: Diagnosis not present

## 2023-01-12 DIAGNOSIS — I50810 Right heart failure, unspecified: Secondary | ICD-10-CM

## 2023-01-12 DIAGNOSIS — I4821 Permanent atrial fibrillation: Secondary | ICD-10-CM | POA: Diagnosis not present

## 2023-01-12 DIAGNOSIS — I7121 Aneurysm of the ascending aorta, without rupture: Secondary | ICD-10-CM

## 2023-01-12 NOTE — Progress Notes (Unsigned)
Cardiology Office Note    Date:  01/12/2023  ID:  Dennis Swanson, Dennis Swanson 31-Aug-1955, MRN [id number] Cardiologist: Dina Rich, MD    History of Present Illness:    Dennis Swanson is a 67 y.o. male with past medical history of permanent atrial fibrillation, HFimpEF (EF 40-45% in 2011, at 60-65% in 04/2015 and at 45-50% by echo in 09/2020 and 50-55% in 12/2021), RV dysfunction, mitral regurgitation, HTN, history of GIB, HLD, history of PE and history of alcohol abuse who presents to the office today for hospital follow-up.  He was recently admitted to [facility] in 11/2022 for an acute CHF exacerbation as he reported worsening shortness of breath and lower extremity edema for the past 2 days. He did report dietary indiscretion and his weight was at 276 lbs on admission. He was started on IV Lasix and repeat echocardiogram showed his EF was similar to prior imaging with EF at 50 to 55%. He did have severely reduced RV function, elevated RVSP at 71 mmHg and trivial MR and severe TR. 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 He responded well to IV Lasix and weight had improved to 270 lbs at discharge. Was transitioned to Torsemide 20 mg daily along with being started on Coreg 3.125 mg twice daily, Farxiga 10 mg daily and Entresto 24-26 mg twice daily. Was continued on Coreg and Eliquis for his atrial fibrillation.  He did follow-up with the [facility] on 12/17/2022 and weight had improved to 263 lbs. He was using supplemental oxygen. He was volume overloaded and had not been taking his diuretic as prescribed and was eating a lot of salt. He was referred to the [facility] team and was restarted on Torsemide 40 mg for 2 days and then to decrease to 20 mg daily.  Was continued on Farxiga 10 mg daily, Entresto 24-26 mg twice daily and Coreg 3.125 mg twice daily.  Given his pulmonary hypertension and RV failure, he was encouraged to  keep follow-up with pulmonary and was referred for an in lab sleep study as he had not completed his home sleep study.  In talking with the patient today, he reports he continues to improve since his recent hospitalization. Breathing has been stable and he denies any specific orthopnea or PND. Still has some lower extremity edema but overall improved. No recent chest pain or palpitations. Remains on Eliquis for anticoagulation with no reports of active bleeding. Reports Paramedicine has been coming to his home at least once a week to help with medications.   Studies Reviewed:   EKG: EKG is not ordered today.  Echocardiogram: 11/2022 IMPRESSIONS     1. Left ventricular ejection fraction, by estimation, is 50 to 55%. Left  ventricular ejection fraction by 3D volume is 58 %. The left ventricle has  low normal function. The left ventricle has no regional wall motion  abnormalities. There is moderate left   ventricular hypertrophy. Left ventricular diastolic parameters are  indeterminate. There is the interventricular septum is flattened in  systole and diastole, consistent with right ventricular pressure and  volume overload.   2. Right ventricular systolic function is severely reduced. The right  ventricular size is severely enlarged. There is severely elevated  pulmonary artery systolic pressure. The estimated right ventricular  systolic pressure is 71.0 mmHg.   3. Right atrial size was severely dilated.   4. The mitral valve is normal in structure. Trivial mitral valve  regurgitation. No evidence of mitral stenosis.   5. Tricuspid valve regurgitation is severe.   6. The aortic valve is calcified. There is mild calcification of the  aortic valve. There is mild thickening of the aortic valve. Aortic valve  regurgitation is not visualized. Aortic valve sclerosis is present, with  no evidence of aortic valve stenosis.   7. Aortic dilatation noted. Aneurysm of the ascending aorta,  measuring 48  mm. There is moderate dilatation of the ascending aorta, measuring 48 mm.   8. The inferior vena cava is dilated in size with <50% respiratory  variability, suggesting right atrial pressure of 15 mmHg.   Comparison(s): Prior 2023 - Aorta ascending measures 40 mm. 2022 CT  ascending aorta 42 mm. Consider repeat CT chest to measure aorta.    Physical Exam:   VS:  BP 128/82   Pulse 80   Ht 6' (1.829 m)   Wt 256 lb (116.1 kg)   SpO2 94%   BMI 34.72 kg/m    Wt Readings from Last 3 Encounters:  01/12/23 256 lb (116.1 kg)  01/06/23 258 lb (117 kg)  12/17/22 269 lb 12.8 oz (122.4 kg)     GEN: Well nourished, well developed in no acute distress NECK: No JVD; No carotid bruits CARDIAC: Irregular irregular, no murmurs, rubs, gallops RESPIRATORY:  Clear to auscultation without rales, wheezing or rhonchi  ABDOMEN: Appears non-distended. No obvious abdominal masses. EXTREMITIES: No clubbing or cyanosis.  Trace lower extremity edema.  Distal pedal pulses are 2+ bilaterally.   Assessment and Plan:   1. Chronic HFmrEF - Ejection fraction was previously at 40 to 45% and had improved to 50 to 55% most recent imaging last month. His weight continues to decline is now at 256 lbs on our office scales today. He has trace edema on examination today but lungs are clear. - Recent labs last week showed his BNP had improved to 126 and creatinine was stable at 1.02. Will continue current medical therapy for now with Coreg 3.125 mg twice daily, Farxiga 10 mg daily and Entresto 24-26 mg twice daily. He is listed as taking Lasix 40mg  BID but should be on Torsemide 20mg  daily by review of notes from [facility]. We will have the patient verify his medication regimen upon returning home as he is unsure of his current medications.   2. RV Failure/Pulmonary HTN - Echocardiogram last month showed his RV function was severely reduced and RVSP was elevated at 71 mmHg with severe TR. He did have a history of a  PE in 2011 and VQ scan was negative in 07/2022. He is followed by [facility] pulmonology and also has follow-up with [facility] next month. May ultimately benefit from [facility].  3. Permament Atrial Fibrillation/Use of Long-term Anticoagulation - He denies any recent palpitations and heart rate is well-controlled in the 70's to 80's during today's visit. Continue Coreg 3.125 mg twice daily for rate control. - No reports of active bleeding. Continue Eliquis 5 mg twice daily for anticoagulation which is the appropriate dose at this time given his age, weight and renal function.  4. Ascending Aortic Aneurysm - This measured 48 mm by echocardiogram in 11/2022. Will plan to obtain a follow-up CTA as recommended during his admission.  Signed, Ellsworth Lennox, PA-C

## 2023-01-12 NOTE — Patient Instructions (Signed)
Medication Instructions:  Your physician recommends that you continue on your current medications as directed. Please refer to the Current Medication list given to you today.   Labwork: None today  Testing/Procedures: CT-scan of the chest   Follow-Up: 3-4 months Dr.Branch or [place] Strader,PA-C  Any Other Special Instructions Will Be Listed Below (If Applicable).  If you need a refill on your cardiac medications before your next appointment, please call your pharmacy.

## 2023-01-13 ENCOUNTER — Encounter: Payer: Self-pay | Admitting: Student

## 2023-02-05 ENCOUNTER — Encounter (HOSPITAL_COMMUNITY): Payer: 59 | Admitting: Internal Medicine

## 2023-02-17 NOTE — Progress Notes (Signed)
 Dennis Swanson, male    DOB: May 31, 1955    MRN: [id number]   Brief patient profile:  67  yobm  never smoker with onset sob age 68   Dennis Swanson pt self referred back to pulmonary clinic in [place]  01/06/2023    Dennis Swanson rec 09/02/12  HST > not done  PFTs severe airflow obst with reversible component but FEV1/FVC only 0.65 c/w restictive component   Admit date: 12/07/2022 Discharge date: 12/11/2022     PCP: Dennis Isles, MD   DISCHARGE DIAGNOSES:    Acute respiratory failure with hypoxia (HCC)   AKI (acute kidney injury) (HCC)   Essential hypertension   Long term current use of anticoagulant   Atrial fibrillation (HCC)   History of pulmonary embolism   Obesity (BMI 30-39.9)   Acute on chronic diastolic heart failure (HCC)   RVF (right ventricular failure) (HCC)     RECOMMENDATIONS FOR OUTPATIENT FOLLOW UP: Home oxygen  has been ordered Cardiology to schedule outpatient follow-up Patient to follow-up with his pulmonologist       Home Health: None Equipment/Devices: Home oxygen    CODE STATUS: Full code   DISCHARGE CONDITION: fair   Diet recommendation: Heart healthy   INITIAL HISTORY: 68 year old male, lives alone, independent, medical history significant for chronic systolic CHF, A-fib, pulmonary embolism on Eliquis , HTN, HLD, obesity, chronic iron deficiency anemia, alcohol use disorder, who presented to the ED on 12/07/2022 with complaints of progressive dyspnea on exertion with minimal activity, progressive lower extremity edema, weight gain possibly up to 30 pounds, abdominal distention but no chest pain. Reports dietary indiscretion and eating salty Dennis and consuming alcohol. In the ED, oxygen  saturations in the 79% on room air, required 6 L/min Dennis Swanson oxygen  to bring up saturations to 92%. Overnight of admission, required BiPAP for acute on chronic hypoxic and hypercapnic respiratory failure. Admitted for acute on chronic diastolic CHF, acute on chronic hypoxic and  hypercapnic respiratory failure.      HOSPITAL COURSE:    Acute on suspected chronic hypoxic and hypercapnic respiratory failure Multifactorial due to decompensated CHF complicating underlying suspected OSA/OHS, ?? COPD/asthma. VBG on admission: pH 7.26, pCO2 111, pO2 <31. Presented with oxygen  saturation of 79% on room air on admission. Patient required BiPAP in the night of admission but then transitioned to nasal cannula. Has seen pulmonology in July 2024, assessed to have biventricular heart failure, remote PE, negative collagen studies, non-smoker, planned for home sleep testing.  Will allow this to be pursued further in the outpatient setting.  Respiratory status is stable for the most part.  Home oxygen  has been ordered.   Acute on chronic diastolic CHF Echocardiogram  EF 50 to 55%.  Moderate LVH was noted.  Previously he has had a EF as low as 40 to 45%. HS Troponin 31 > 30, minimally elevated, flat trend, no chest pain, suspect demand ischemia. Given IV furosemide .  Transition to oral diuretics. Entresto  was placed on hold.  Was not supposed to be discharged on this.  However looks like this has been resumed by the pharmacist at discharge.  Not clear who directed this pharmacist to resume this medication.   A-fib with controlled ventricular rate: Continue carvedilol  and apixaban .   Acute kidney injury complicating stage II CKD/hyperkalemia Last known creatinine 1.17 in May 23.   Presented with creatinine of 1.65,?  Cardiorenal syndrome Renal function stable for the most part.    Hypernatremia, mild Sodium level has improved   Thrombocytopenia Stable.  Essential hypertension   History of pulmonary embolism Continue Eliquis .  No DVT on Doppler studies.   Alcohol use disorder Does not appear to be forthcoming about the exact amount of alcohol he consumes. CIWA protocol. No evidence for withdrawal currently.   Suspected sleep apnea Outpatient follow-up with  pulmonology for sleep study. CPAP at bedtime   Aortic dilatation Noted on echocardiogram.  Outpatient monitoring.   Pulmonary hypertension Noted on echocardiogram.  RV function is reduced.  Seen on previous echo as well.  Outpatient follow-up with pulmonology.   Chronic pain Continue prior home dose of opioids.     Body mass index is 36.78 kg/m./Obesity Lifestyle modifications and weight loss as outpatient.     History of Present Illness  01/06/2023  Pulmonary/ 1st office eval/ Dennis Swanson / Dennis Swanson Office new start last admit  Chief Complaint  Patient presents with   Hospitalization Follow-up  Dyspnea:  baseline still  doing Dennis Swanson,  several aisles s stopping and s 02 and has not resumed since d/c Cough: none  Sleep: bed is flat/ 2 pillows  SABA use: none  02: 2lpm hs / prn daytime but not titrating as does not have pulse ox Rec Oxygen :  2lpm sleeping, 2lpm sitting, 3 lpm walking  and Make sure you check your oxygen  saturation  AT  your highest level of activity (not after you stop)   to be sure it stays over 90%   My office will be contacting you by phone for referral for ONO  on 2lpm 0xygen   Trelegy 100  x  1 click 1st thing in am and take at least two good forceful drags Work on inhaler technique:    Allergy screen  01/06/23 >  Eos 0.1/  IgE  113   02/18/2023  f/u ov/Dennis Swanson office/Dennis Swanson re: asthma plus restriction from obesity/ LVH with diastolic dysfunction and elevated PAS  maint on prn trelegy   Chief Complaint  Patient presents with   Follow-up    Breathing is much improved since the last visit. Denies any new respiratory co's.   Dyspnea:  walking around neighborhood 20 min daily - stops after 10 min/ ? Some better with trelegy 100 but not using as maint rx  Cough: none  Sleeping: flat bed/ 2 pillows s  resp cc  SABA use: none  02: 2lpm  prn but usually not using  No obvious day to day or daytime variability or assoc excess/ purulent sputum or mucus plugs or  hemoptysis or cp or chest tightness, subjective wheeze or overt sinus or hb symptoms.    Also denies any obvious fluctuation of symptoms with weather or environmental changes or other aggravating or alleviating factors except as outlined above   No unusual exposure hx or h/o childhood pna/ asthma or knowledge of premature birth.  Current Allergies, Complete Past Medical History, Past Surgical History, Family History, and Social History were reviewed in [employer] record.  ROS  The following are not active complaints unless bolded Hoarseness, sore throat, dysphagia, dental problems, itching, sneezing,  nasal congestion or discharge of excess mucus or purulent secretions, ear ache,   fever, chills, sweats, unintended wt loss or wt gain, classically pleuritic or exertional cp,  orthopnea pnd or arm/hand swelling  or leg swelling, presyncope, palpitations, abdominal pain, anorexia, nausea, vomiting, diarrhea  or change in bowel habits or change in bladder habits, change in stools or change in urine, dysuria, hematuria,  rash, arthralgias, visual complaints, headache, numbness, weakness or  ataxia or problems with walking or coordination,  change in mood or  memory.        Current Meds  Medication Sig   apixaban  (ELIQUIS ) 5 MG TABS tablet Take 1 tablet (5 mg total) by mouth 2 (two) times daily.   carvedilol  (COREG ) 3.125 MG tablet Take 1 tablet (3.125 mg total) by mouth 2 (two) times daily with a meal.   dapagliflozin  propanediol (FARXIGA ) 10 MG TABS tablet Take 1 tablet (10 mg total) by mouth daily.   Fluticasone-Umeclidin-Vilant (TRELEGY ELLIPTA ) 100-62.5-25 MCG/ACT AEPB Inhale 1 puff into the lungs daily.   furosemide  (LASIX ) 40 MG tablet Take 40 mg by mouth 2 (two) times daily.   naloxone (NARCAN) nasal spray 4 mg/0.1 mL    oxyCODONE -acetaminophen  (PERCOCET) 10-325 MG tablet Take 1 tablet by mouth every 6 (six) hours as needed for pain.   pantoprazole  (PROTONIX ) 40 MG  tablet Take 1 tablet (40 mg total) by mouth daily before breakfast.   potassium chloride  SA (KLOR-CON  M) 20 MEQ tablet Take 1 tablet (20 mEq total) by mouth daily.   sacubitril -valsartan  (ENTRESTO ) 24-26 MG Take 1 tablet by mouth 2 (two) times daily.   thiamine  (VITAMIN B1) 100 MG tablet Take 1 tablet (100 mg total) by mouth daily.             Past Medical History:  Diagnosis Date   Acute pulmonary embolism (HCC) 02/16/2009   Alcohol abuse    Atrial flutter (HCC)    CHF (congestive heart failure) (HCC)    a. EF 40-45% in 2011 b. EF at 60-65% in 04/2015 c. EF at 45-50% by echo in 09/2020   Dysrhythmia    Hypertension    Lower extremity cellulitis    bilateral   Systolic dysfunction       Objective:    Wts   02/18/2023         248   01/12/23 256 lb (116.1 kg)  01/06/23 258 lb (117 kg)  12/17/22 269 lb 12.8 oz (122.4 kg)    Vital signs reviewed  02/18/2023  - Note at rest 02 sats  91% on RA   General appearance:    amb bm easily confused with details of care    HEENT : Oropharynx  poor dentition        NECK :  without  apparent JVD/ palpable Nodes/TM    LUNGS: no acc muscle use,  Nl contour chest which is clear to A and P bilaterally without cough on insp or exp maneuvers   CV:  RRR  no s3 or murmur or increase in P2, and no significant edema   ABD:  soft and nontender   MS:  Gait nl   ext warm without deformities Or obvious joint restrictions  calf tenderness, cyanosis or clubbing    SKIN: warm and dry without lesions    NEURO:  alert, approp, nl sensorium with  no motor or cerebellar deficits apparent.       Assessment

## 2023-02-18 ENCOUNTER — Encounter: Payer: Self-pay | Admitting: Internal Medicine

## 2023-02-18 ENCOUNTER — Ambulatory Visit (INDEPENDENT_AMBULATORY_CARE_PROVIDER_SITE_OTHER): Payer: 59 | Admitting: Internal Medicine

## 2023-02-18 VITALS — BP 122/80 | HR 86 | Ht 72.0 in | Wt 248.0 lb

## 2023-02-18 DIAGNOSIS — J4489 Other specified chronic obstructive pulmonary disease: Secondary | ICD-10-CM | POA: Diagnosis not present

## 2023-02-18 DIAGNOSIS — J9612 Chronic respiratory failure with hypercapnia: Secondary | ICD-10-CM | POA: Diagnosis not present

## 2023-02-18 DIAGNOSIS — J9611 Chronic respiratory failure with hypoxia: Secondary | ICD-10-CM

## 2023-02-18 MED ORDER — TRELEGY ELLIPTA 100-62.5-25 MCG/ACT IN AEPB: INHALATION_SPRAY | RESPIRATORY_TRACT | 11 refills | Status: DC

## 2023-02-18 MED ORDER — TRELEGY ELLIPTA 100-62.5-25 MCG/ACT IN AEPB: 1.0000 | INHALATION_SPRAY | Freq: Every day | RESPIRATORY_TRACT | Status: DC

## 2023-02-18 NOTE — Patient Instructions (Signed)
 Trelegy 100  one click AM x 4 week sample and fill prescription if you feel it helps your breathing  Make sure you check your oxygen  saturation  AT  your highest level of activity (not after you stop)   to be sure it stays over 90% and adjust  02 flow upward to maintain this level if needed but remember to turn it back to previous settings when you stop (to conserve your supply).    My office will be contacting you by phone for referral to Overnight oxygen  on NO oxygen    - if you don't hear back from my office within one week please call us  back or notify us  thru MyChart and we'll address it right away.   Please schedule a follow up office visit in 6 weeks, call sooner if needed with all medications /inhalers/ solutions/ portable 02  in hand so we can verify exactly what you are taking. This includes all medications from all doctors and over the counters

## 2023-02-19 ENCOUNTER — Telehealth (HOSPITAL_COMMUNITY): Payer: Self-pay | Admitting: Licensed Clinical Social Worker

## 2023-02-19 NOTE — Assessment & Plan Note (Signed)
 Started on 2lpm at d/c  12/10/21  - sats 83% RA at rest 01/06/2023  - 01/06/2023  83% ROOM AIR; placed on 2L CONT and walked slow paced x 150 ft >  sat 87%, placed on 3L CONT and walked another 150 ft slow paced :   90% on 3L CONT  - HC03   01/06/23 38  with nl TSH   - 02/18/2023   Walked on RA  x  2  lap(s) =  approx 300  ft  @ mod pace, stopped due to desats to 86% p 1st lap > rec titrtate to sats > 90% with ex and check ONO on RA   Reviewed dangers of hypoxemia given PH and tendency to fluid retention resulting in admit   Discussed in detail all the  indications, usual  risks and alternatives  relative to the benefits with patient who agrees to proceed with Rx as outlined.      Each maintenance medication was reviewed in detail including emphasizing most importantly the difference between maintenance and prns and under what circumstances the prns are to be triggered using an action plan format where appropriate.  Total time for H and P, chart review, counseling, reviewing dpi/ 02/0pulse ox  device(s) , directly observing portions of ambulatory 02 saturation study/ and generating customized AVS unique to this office visit / same day charting  > 40 min for multiple  refractory respiratory  symptoms

## 2023-02-19 NOTE — Assessment & Plan Note (Signed)
 Never regular smoker - PFT's  10/20/22  FEV1 1.16 (32 % ) ratio 0.65 (LLN 0.64 )  p 17 % improvement from saba p 0 prior to study with DLCO  19.9 (71%)   and FV curve mildly concave and ERV 24 at wt 255    - 01/06/2023  > try trelegy 100 one click each am > did not understand meaning of maint rx  - Allergy screen  01/06/23 >  Eos 0.1/  IgE  113 - 02/18/2023  After extensive coaching inhaler device,  effectiveness =    90% > rechallenge with trelegy x 6 week samples then ov   Severe chronic asthma sith remodeling likely > best option is trelegy 100 or breatri but not as prns

## 2023-02-19 NOTE — Telephone Encounter (Signed)
 CSW sent updated clinicals to [facility] for review.  Burna Sis, LCSW Clinical Social Worker [facility] Desk#: [phone] Cell#: [phone]

## 2023-03-12 ENCOUNTER — Telehealth: Payer: Self-pay | Admitting: Internal Medicine

## 2023-03-12 DIAGNOSIS — J9611 Chronic respiratory failure with hypoxia: Secondary | ICD-10-CM

## 2023-03-12 NOTE — Telephone Encounter (Signed)
Desat RA x 5 h so needs 2lpm cont hs and recheck ono on 2lpm

## 2023-03-12 NOTE — Telephone Encounter (Addendum)
Left message for patient to call clinic to give information per Dr. Sherene Sires.  Need to verify patient is on oxygen at home and how many liters (this has been ordered per chart notes).  Will need to order another ONO on 2L oxygen

## 2023-03-19 NOTE — Telephone Encounter (Addendum)
Left message on VM x 2 for patient to call clinic to give ONO results.  Will also leave MyChart message.

## 2023-03-22 ENCOUNTER — Encounter (HOSPITAL_COMMUNITY): Payer: Self-pay | Admitting: Internal Medicine

## 2023-03-22 ENCOUNTER — Ambulatory Visit (HOSPITAL_COMMUNITY)
Admission: RE | Admit: 2023-03-22 | Discharge: 2023-03-22 | Disposition: A | Discharge status: Home / Self Care | Payer: 59 | Source: Ambulatory Visit | Attending: Internal Medicine | Admitting: Internal Medicine

## 2023-03-22 ENCOUNTER — Other Ambulatory Visit (HOSPITAL_COMMUNITY): Payer: Self-pay | Admitting: *Deleted

## 2023-03-22 VITALS — BP 110/70 | HR 80 | Wt 250.4 lb

## 2023-03-22 DIAGNOSIS — I5022 Chronic systolic (congestive) heart failure: Secondary | ICD-10-CM | POA: Insufficient documentation

## 2023-03-22 DIAGNOSIS — Z7901 Long term (current) use of anticoagulants: Secondary | ICD-10-CM | POA: Insufficient documentation

## 2023-03-22 DIAGNOSIS — J988 Other specified respiratory disorders: Secondary | ICD-10-CM | POA: Diagnosis not present

## 2023-03-22 DIAGNOSIS — F1010 Alcohol abuse, uncomplicated: Secondary | ICD-10-CM | POA: Insufficient documentation

## 2023-03-22 DIAGNOSIS — I5032 Chronic diastolic (congestive) heart failure: Secondary | ICD-10-CM | POA: Diagnosis not present

## 2023-03-22 DIAGNOSIS — I50810 Right heart failure, unspecified: Secondary | ICD-10-CM

## 2023-03-22 DIAGNOSIS — E669 Obesity, unspecified: Secondary | ICD-10-CM | POA: Diagnosis not present

## 2023-03-22 DIAGNOSIS — I272 Pulmonary hypertension, unspecified: Secondary | ICD-10-CM

## 2023-03-22 DIAGNOSIS — I4821 Permanent atrial fibrillation: Secondary | ICD-10-CM | POA: Diagnosis not present

## 2023-03-22 DIAGNOSIS — R0683 Snoring: Secondary | ICD-10-CM | POA: Diagnosis not present

## 2023-03-22 DIAGNOSIS — Z86711 Personal history of pulmonary embolism: Secondary | ICD-10-CM | POA: Diagnosis not present

## 2023-03-22 DIAGNOSIS — Z110 Encounter for screening for intestinal infectious diseases: Secondary | ICD-10-CM | POA: Diagnosis not present

## 2023-03-22 DIAGNOSIS — I2781 Cor pulmonale (chronic): Secondary | ICD-10-CM | POA: Insufficient documentation

## 2023-03-22 DIAGNOSIS — I2729 Other secondary pulmonary hypertension: Secondary | ICD-10-CM | POA: Insufficient documentation

## 2023-03-22 DIAGNOSIS — I50812 Chronic right heart failure: Secondary | ICD-10-CM | POA: Diagnosis not present

## 2023-03-22 DIAGNOSIS — E785 Hyperlipidemia, unspecified: Secondary | ICD-10-CM | POA: Diagnosis not present

## 2023-03-22 LAB — BASIC METABOLIC PANEL
Anion gap: 11 (ref 5–15)
BUN: 20 mg/dL (ref 8–23)
CO2: 29 mmol/L (ref 22–32)
Calcium: 9.1 mg/dL (ref 8.9–10.3)
Chloride: 101 mmol/L (ref 98–111)
Creatinine, Ser: 1.34 mg/dL — ABNORMAL HIGH (ref 0.61–1.24)
GFR, Estimated: 58 mL/min — ABNORMAL LOW (ref >60)
Glucose, Bld: 110 mg/dL — ABNORMAL HIGH (ref 70–99)
Potassium: 4.1 mmol/L (ref 3.5–5.1)
Sodium: 141 mmol/L (ref 135–145)

## 2023-03-22 LAB — CBC
HCT: 49.6 % (ref 39.0–52.0)
Hemoglobin: 16.3 g/dL (ref 13.0–17.0)
MCH: 31 pg (ref 26.0–34.0)
MCHC: 32.9 g/dL (ref 30.0–36.0)
MCV: 94.5 fL (ref 80.0–100.0)
Platelets: 147 10*3/uL — ABNORMAL LOW (ref 150–400)
RBC: 5.25 MIL/uL (ref 4.22–5.81)
RDW: 14.9 % (ref 11.5–15.5)
WBC: 4.7 10*3/uL (ref 4.0–10.5)
nRBC: 0 % (ref 0.0–0.2)

## 2023-03-22 LAB — BRAIN NATRIURETIC PEPTIDE: B Natriuretic Peptide: 90.2 pg/mL (ref 0.0–100.0)

## 2023-03-22 NOTE — Progress Notes (Signed)
6 Min Walk Test Completed  Pt ambulated 1600 ft (487.72m) O2 Sat ranged  95-88%L  on room air HR ranged 79-119

## 2023-03-22 NOTE — Patient Instructions (Signed)
Great to see you today!!!  Medication Changes:  None, continue current medications  Lab Work:  Labs done today, your results will be available in MyChart, we will contact you for abnormal readings.  Testing/Procedures:  Your physician has requested that you have an echocardiogram. Echocardiography is a painless test that uses sound waves to create images of your heart. It provides your doctor with information about the size and shape of your heart and how well your heart's chambers and valves are working. This procedure takes approximately one hour. There are no restrictions for this procedure. Please do NOT wear cologne, perfume, aftershave, or lotions (deodorant is allowed). Please arrive 15 minutes prior to your appointment time. THIS WILL BE DONE [facility], THEY WILL CALL YOU TO SCHEDULE  Please note: We ask at that you not bring children with you during ultrasound (echo/ vascular) testing. Due to room size and safety concerns, children are not allowed in the ultrasound rooms during exams. Our front office staff cannot provide observation of children in our lobby area while testing is being conducted. An adult accompanying a patient to their appointment will only be allowed in the ultrasound room at the discretion of the ultrasound technician under special circumstances. We apologize for any inconvenience.  Heart Catheterization on Friday 2/28, see instructions below  Special Instructions // Education:  Do the following things EVERYDAY: Weigh yourself in the morning before breakfast. Write it down and keep it in a log. Take your medicines as prescribed Eat low salt foods--Limit salt (sodium) to 2000 mg per day.  Stay as active as you can everyday Limit all fluids for the day to less than 2 liters   CATHETERIZATION INSTRUCTIONS:  You are scheduled for a Cardiac Catheterization on Friday, January 28 with Dr. Arvilla Meres.  1. Please arrive at the [facility] (Main  Entrance A) at [facility]: [address] [place], [place] [address] at 7:00 AM (This time is 2 hour(s) before your procedure to ensure your preparation).   Free valet parking service is available. You will check in at ADMITTING. The support person will be asked to wait in the waiting room.  It is OK to have someone drop you off and come back when you are ready to be discharged.    Special note: Every effort is made to have your procedure done on time. Please understand that emergencies sometimes delay scheduled procedures.  2. Diet: Do not eat solid foods after midnight.  The patient may have clear liquids until 5am upon the day of the procedure.  3. Labs: DONE TODAY  4. Medication instructions in preparation for your procedure:   THURSDAY 2/27 PM DO NOT TAKE ELIQUIS  FRIDAY 2/28 AM DO NOT TAKE: Eliquis, Farxiga, or Furosemide  On the morning of your procedure, take any morning medicines NOT listed above.  You may use sips of water.  5. Plan to go home the same day, you will only stay overnight if medically necessary. 6. Bring a current list of your medications and current insurance cards. 7. You MUST have a responsible person to drive you home. 8. Someone MUST be with you the first 24 hours after you arrive home or your discharge will be delayed. 9. Please wear clothes that are easy to get on and off and wear slip-on shoes.  Thank you for allowing [place] to care for you!   -- [facility]   Follow-Up in: 4 months (June), **PLEASE CALL OUR OFFICE  IN APRIL TO SCHEDULE THIS APPOINTMENT   At the [facility], you and your health needs are our priority. We have a designated team specialized in the treatment of Heart Failure. This Care Team includes your primary Heart Failure Specialized Cardiologist (physician), Advanced Practice Providers (APPs- Physician Assistants and Nurse Practitioners), and Pharmacist who all work together to  provide you with the care you need, when you need it.   You may see any of the following providers on your designated Care Team at your next follow up:  Dr. Arvilla Meres Dr. Marca Ancona Dr. Dorthula Nettles Dr. Theresia Bough Tonye Becket, NP Robbie Lis, [place] [facility] [place], [place] Brynda Peon, NP [place] Lee, NP Karle Plumber, PharmD   Please be sure to bring in all your medications bottles to every appointment.   Need to Contact us:  If you have any questions or concerns before your next appointment please send [place] a message through [place] or call our office at [phone].    TO LEAVE A MESSAGE FOR THE NURSE SELECT OPTION 2, PLEASE LEAVE A MESSAGE INCLUDING: YOUR NAME DATE OF BIRTH CALL BACK NUMBER REASON FOR CALL**this is important as we prioritize the call backs  YOU WILL RECEIVE A CALL BACK THE SAME DAY AS LONG AS YOU CALL BEFORE 4:00 PM

## 2023-03-22 NOTE — H&P (View-Only) (Signed)
 ADVANCED HF CLINIC CONSULT NOTE     Referring Physician: Dr. Rito Ehrlich Primary Care: Dr. Sherryll Burger Primary Cardiologist: Dr. Wyline Mood  HPI: 68 y.o. 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 Marland Kitchen   He has a history of CHF dating back to 2011. EF 40-45% at that time. In 2017, EF was 60-65% on echo.   Echo 07/22: EF 45-50%, moderate LVH, interventricular septum flattened in systole and diastole consistent with RV pressure and volume overload, RV severely enlarged with severely reduced function, RVSP 57 mmHg, severe TR, dilated IVC  Echo 11/23: EF 50-55%, RV severely reduced, severe BAE  He has been followed by Dr. Wyline Mood. Sleep study has previously been recommended but not completed by patient. No evidence of PE on V/Q scan 06/24. PFTs 10/20/22: FEV1 0.99, FEV1/FVC ratio 60%, severe obstructive airway disease and mild diffusion defect  He was admitted 12/07/22 with suspected acute on chronic hypoxic and hypercapnic respiratory failure requiring BiPAP 2/2 acute on chronic CHF. VBG on admission with pH 7.26, CO2 111. He had been eating a lot of salt. Reported medication compliance.  Cardiology consulted. Echo with EF 50-55%, moderate LVH, RV pressure and volume overload, RV severely enlarged with severely reduced function, RVSP 71 mmHg, severe RAE, severe TR.  He was diuresed with IV lasix and GDMT titrated.  Here today for hospital follow-up. Has been following up with Dr. Sherene Sires in Pulmonary. Says he feels pretty good. Can do most actiivities without too much problem. Says he did home sleep study recently but didn't get results yet. Edema much improved. Niece helps put meds in pill box. Doing exercise bike 2x/day for 20 mins each time. Says Dr. Sherene Sires told him he didn't need O2 any more. Has lost 30 pounds.   Denies tobacco  use. Reports consuming 1 shot of liquor a week.    Past Medical History:  Diagnosis Date   Acute pulmonary embolism (HCC) 02/16/2009   Alcohol abuse    Atrial flutter (HCC)    CHF (congestive heart failure) (HCC)    a. EF 40-45% in 2011 b. EF at 60-65% in 04/2015 c. EF at 45-50% by echo in 09/2020   Dysrhythmia    Hypertension    Lower extremity cellulitis    bilateral   Systolic dysfunction     Current Outpatient Medications  Medication Sig Dispense Refill   apixaban (ELIQUIS) 5 MG TABS tablet Take 1 tablet (5 mg total) by mouth 2 (two) times daily. 180 tablet 3   carvedilol (COREG) 3.125 MG tablet Take 1 tablet (3.125 mg total) by mouth 2 (two) times daily with a meal. 180 tablet 3   dapagliflozin propanediol (FARXIGA) 10 MG TABS tablet Take 1 tablet (10 mg total) by mouth daily. 90 tablet 3   Fluticasone-Umeclidin-Vilant (TRELEGY ELLIPTA) 100-62.5-25 MCG/ACT AEPB Inhale 1 puff into the lungs daily.     furosemide (LASIX) 40 MG tablet Take 40 mg by mouth 2 (two) times daily.     naloxone (NARCAN) nasal spray 4 mg/0.1 mL      oxyCODONE-acetaminophen (PERCOCET) 10-325 MG tablet Take 1 tablet by mouth every 6 (six) hours as needed for pain.     pantoprazole (PROTONIX) 40 MG tablet Take 1 tablet (40 mg total) by mouth daily before breakfast. 90 tablet 3   potassium chloride SA (KLOR-CON M) 20 MEQ tablet  Take 1 tablet (20 mEq total) by mouth daily. 90 tablet 3   sacubitril-valsartan (ENTRESTO) 24-26 MG Take 1 tablet by mouth 2 (two) times daily. 180 tablet 3   thiamine (VITAMIN B1) 100 MG tablet Take 1 tablet (100 mg total) by mouth daily. 90 tablet 3   No current facility-administered medications for this encounter.    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  Tobacco Use   Smoking status: Never   Smokeless tobacco: Never  Vaping Use   Vaping status: Never Used  Substance and Sexual Activity   Alcohol use: Yes    Alcohol/week: 3.0 standard drinks of alcohol    Types: 3 Cans of beer per week    Comment: 1-2 beer a week   Drug use: No   Sexual activity: Not Currently  Other Topics Concern   Not on file  Social History Narrative   Lives alone, 2 daughters   No regular exercise   Social Drivers of Health   Financial Resource Strain: Low Risk  (12/09/2022)   Overall Financial Resource Strain (CARDIA)    Difficulty of Paying Living Expenses: Not hard at all  Food Insecurity: No Food Insecurity (12/07/2022)   Hunger Vital Sign    Worried About Running Out of Food in the Last Year: Never true    Ran Out of Food in the Last Year: Never true  Transportation Needs: No Transportation Needs (12/09/2022)   PRAPARE - Administrator, Civil Service (Medical): No    Lack of Transportation (Non-Medical): No  Physical Activity: Inactive (10/02/2020)   Exercise Vital Sign    Days of Exercise per Week: 0 days    Minutes of Exercise per Session: 0 min  Stress: Not on file  Social Connections: Not on file  Intimate Partner Violence: Not At Risk (12/07/2022)   Humiliation, Afraid, Rape, and Kick questionnaire    Fear of Current or Ex-Partner: No    Emotionally Abused: No    Physically Abused: No    Sexually Abused: No      Family History  Problem Relation Age of Onset   Heart attack Mother    Hypertension Brother    Diabetes Brother    Cancer Father    Colon cancer Neg Hx     Vitals:   03/22/23 1051  BP: 110/70  Pulse: 80  SpO2: 95%  Weight: 113.6 kg (250 lb 6.4 oz)    Filed Weights   03/22/23 1051  Weight: 113.6 kg (250 lb 6.4 oz)    PHYSICAL EXAM: General:  Well appearing. No resp difficulty HEENT: normal Neck: supple. no JVD. Carotids 2+ bilat; no bruits. No lymphadenopathy  or thryomegaly appreciated. Cor: PMI nondisplaced. Irregular rate & rhythm. No rubs, gallops or murmurs. Lungs: decreased throughout Abdomen: obese soft, nontender, nondistended. No hepatosplenomegaly. No bruits or masses. Good bowel sounds. Extremities: no cyanosis, clubbing, rash, edema Neuro: alert & orientedx3, cranial nerves grossly intact. moves all 4 extremities w/o difficulty. 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  -  Last echo 10/24: EF 50-55%, moderate LVH, RV pressure and volume overload, RV severely enlarged with severely reduced function, RVSP 71 mmHg, severe RAE, severe TR - He has predominantly RV failure - suspect LV dysfunction related to pressure/volume overload on septum - Much improved with volume removal - Now NYHA II. - Volume looks good. Continue lasix 40 bid - Continue Farxiga 10 mg daily - Continue Entresto 24/26 mg BID - Continue Coreg 3.125 mg BID - Add spiro at next visit - Check labs today  2. Cor pulmonale with pulmonary hypertension: -RVSP 71 on most recent echo 10/24. RV severely reduced.  -? Predominately WHO groups II & III -Has hx of PE in 2011. V/Q scan in 06/24 negative for PE. -PFTs 09/24 with FEV1 0.99, FEV1/FVC ratio 60%, no significant response to bronchodilators. Has f/u with Pulmonary next month. -CO2 chronically elevated. Suspect significant OSA/OHS. Following with Dr Sherene Sires - Has completed HST. Await results - walk today -> 470m. O2 sats 95% -> 88% - Repeat echo - Discussed need for RHC. He is in agreement. Will proceed  3. HTN -Blood pressure well controlled. Continue current regimen.  4. Permanent atrial fibrillation: -Has been in atrial fibrillation at least since 2018 -Rate controlled -Continue Eliquis 5 bid. No bleedign  5. ETOH abuse: -Reports drinking 1  shot a week currently  6. Snoring/daytime fatigue: - need to track down results of HST   Arvilla Meres, MD  9:59 PM

## 2023-03-22 NOTE — Progress Notes (Signed)
 ADVANCED HF CLINIC CONSULT NOTE     Referring Physician: Dr. Rito Ehrlich Primary Care: Dr. Sherryll Burger Primary Cardiologist: Dr. Wyline Mood  HPI: 68 y.o. 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 Marland Kitchen   He has a history of CHF dating back to 2011. EF 40-45% at that time. In 2017, EF was 60-65% on echo.   Echo 07/22: EF 45-50%, moderate LVH, interventricular septum flattened in systole and diastole consistent with RV pressure and volume overload, RV severely enlarged with severely reduced function, RVSP 57 mmHg, severe TR, dilated IVC  Echo 11/23: EF 50-55%, RV severely reduced, severe BAE  He has been followed by Dr. Wyline Mood. Sleep study has previously been recommended but not completed by patient. No evidence of PE on V/Q scan 06/24. PFTs 10/20/22: FEV1 0.99, FEV1/FVC ratio 60%, severe obstructive airway disease and mild diffusion defect  He was admitted 12/07/22 with suspected acute on chronic hypoxic and hypercapnic respiratory failure requiring BiPAP 2/2 acute on chronic CHF. VBG on admission with pH 7.26, CO2 111. He had been eating a lot of salt. Reported medication compliance.  Cardiology consulted. Echo with EF 50-55%, moderate LVH, RV pressure and volume overload, RV severely enlarged with severely reduced function, RVSP 71 mmHg, severe RAE, severe TR.  He was diuresed with IV lasix and GDMT titrated.  Here today for hospital follow-up. Has been following up with Dr. Sherene Sires in Pulmonary. Says he feels pretty good. Can do most actiivities without too much problem. Says he did home sleep study recently but didn't get results yet. Edema much improved. Niece helps put meds in pill box. Doing exercise bike 2x/day for 20 mins each time. Says Dr. Sherene Sires told him he didn't need O2 any more. Has lost 30 pounds.   Denies tobacco  use. Reports consuming 1 shot of liquor a week.    Past Medical History:  Diagnosis Date   Acute pulmonary embolism (HCC) 02/16/2009   Alcohol abuse    Atrial flutter (HCC)    CHF (congestive heart failure) (HCC)    a. EF 40-45% in 2011 b. EF at 60-65% in 04/2015 c. EF at 45-50% by echo in 09/2020   Dysrhythmia    Hypertension    Lower extremity cellulitis    bilateral   Systolic dysfunction     Current Outpatient Medications  Medication Sig Dispense Refill   apixaban (ELIQUIS) 5 MG TABS tablet Take 1 tablet (5 mg total) by mouth 2 (two) times daily. 180 tablet 3   carvedilol (COREG) 3.125 MG tablet Take 1 tablet (3.125 mg total) by mouth 2 (two) times daily with a meal. 180 tablet 3   dapagliflozin propanediol (FARXIGA) 10 MG TABS tablet Take 1 tablet (10 mg total) by mouth daily. 90 tablet 3   Fluticasone-Umeclidin-Vilant (TRELEGY ELLIPTA) 100-62.5-25 MCG/ACT AEPB Inhale 1 puff into the lungs daily.     furosemide (LASIX) 40 MG tablet Take 40 mg by mouth 2 (two) times daily.     naloxone (NARCAN) nasal spray 4 mg/0.1 mL      oxyCODONE-acetaminophen (PERCOCET) 10-325 MG tablet Take 1 tablet by mouth every 6 (six) hours as needed for pain.     pantoprazole (PROTONIX) 40 MG tablet Take 1 tablet (40 mg total) by mouth daily before breakfast. 90 tablet 3   potassium chloride SA (KLOR-CON M) 20 MEQ tablet  Take 1 tablet (20 mEq total) by mouth daily. 90 tablet 3   sacubitril-valsartan (ENTRESTO) 24-26 MG Take 1 tablet by mouth 2 (two) times daily. 180 tablet 3   thiamine (VITAMIN B1) 100 MG tablet Take 1 tablet (100 mg total) by mouth daily. 90 tablet 3   No current facility-administered medications for this encounter.    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  Tobacco Use   Smoking status: Never   Smokeless tobacco: Never  Vaping Use   Vaping status: Never Used  Substance and Sexual Activity   Alcohol use: Yes    Alcohol/week: 3.0 standard drinks of alcohol    Types: 3 Cans of beer per week    Comment: 1-2 beer a week   Drug use: No   Sexual activity: Not Currently  Other Topics Concern   Not on file  Social History Narrative   Lives alone, 2 daughters   No regular exercise   Social Drivers of Health   Financial Resource Strain: Low Risk  (12/09/2022)   Overall Financial Resource Strain (CARDIA)    Difficulty of Paying Living Expenses: Not hard at all  Food Insecurity: No Food Insecurity (12/07/2022)   Hunger Vital Sign    Worried About Running Out of Food in the Last Year: Never true    Ran Out of Food in the Last Year: Never true  Transportation Needs: No Transportation Needs (12/09/2022)   PRAPARE - Administrator, Civil Service (Medical): No    Lack of Transportation (Non-Medical): No  Physical Activity: Inactive (10/02/2020)   Exercise Vital Sign    Days of Exercise per Week: 0 days    Minutes of Exercise per Session: 0 min  Stress: Not on file  Social Connections: Not on file  Intimate Partner Violence: Not At Risk (12/07/2022)   Humiliation, Afraid, Rape, and Kick questionnaire    Fear of Current or Ex-Partner: No    Emotionally Abused: No    Physically Abused: No    Sexually Abused: No      Family History  Problem Relation Age of Onset   Heart attack Mother    Hypertension Brother    Diabetes Brother    Cancer Father    Colon cancer Neg Hx     Vitals:   03/22/23 1051  BP: 110/70  Pulse: 80  SpO2: 95%  Weight: 113.6 kg (250 lb 6.4 oz)    Filed Weights   03/22/23 1051  Weight: 113.6 kg (250 lb 6.4 oz)    PHYSICAL EXAM: General:  Well appearing. No resp difficulty HEENT: normal Neck: supple. no JVD. Carotids 2+ bilat; no bruits. No lymphadenopathy  or thryomegaly appreciated. Cor: PMI nondisplaced. Irregular rate & rhythm. No rubs, gallops or murmurs. Lungs: decreased throughout Abdomen: obese soft, nontender, nondistended. No hepatosplenomegaly. No bruits or masses. Good bowel sounds. Extremities: no cyanosis, clubbing, rash, edema Neuro: alert & orientedx3, cranial nerves grossly intact. moves all 4 extremities w/o difficulty. 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  -  Last echo 10/24: EF 50-55%, moderate LVH, RV pressure and volume overload, RV severely enlarged with severely reduced function, RVSP 71 mmHg, severe RAE, severe TR - He has predominantly RV failure - suspect LV dysfunction related to pressure/volume overload on septum - Much improved with volume removal - Now NYHA II. - Volume looks good. Continue lasix 40 bid - Continue Farxiga 10 mg daily - Continue Entresto 24/26 mg BID - Continue Coreg 3.125 mg BID - Add spiro at next visit - Check labs today  2. Cor pulmonale with pulmonary hypertension: -RVSP 71 on most recent echo 10/24. RV severely reduced.  -? Predominately WHO groups II & III -Has hx of PE in 2011. V/Q scan in 06/24 negative for PE. -PFTs 09/24 with FEV1 0.99, FEV1/FVC ratio 60%, no significant response to bronchodilators. Has f/u with Pulmonary next month. -CO2 chronically elevated. Suspect significant OSA/OHS. Following with Dr Sherene Sires - Has completed HST. Await results - walk today -> 470m. O2 sats 95% -> 88% - Repeat echo - Discussed need for RHC. He is in agreement. Will proceed  3. HTN -Blood pressure well controlled. Continue current regimen.  4. Permanent atrial fibrillation: -Has been in atrial fibrillation at least since 2018 -Rate controlled -Continue Eliquis 5 bid. No bleedign  5. ETOH abuse: -Reports drinking 1  shot a week currently  6. Snoring/daytime fatigue: - need to track down results of HST   Arvilla Meres, MD  9:59 PM

## 2023-03-29 ENCOUNTER — Other Ambulatory Visit: Payer: Self-pay | Admitting: *Deleted

## 2023-03-29 MED ORDER — CARVEDILOL 3.125 MG PO TABS: 3.1250 mg | ORAL_TABLET | Freq: Two times a day (BID) | ORAL | 3 refills | Status: AC

## 2023-03-30 ENCOUNTER — Encounter: Payer: Self-pay | Admitting: Internal Medicine

## 2023-03-31 NOTE — Progress Notes (Deleted)
 Dennis Swanson, male    DOB: [birth year]    MRN: [id number]   Brief patient profile:  53  yobm  never smoker with onset sob age 68   Dennis Swanson pt self referred back to pulmonary clinic in [place]  01/06/2023    Dennis Swanson rec 09/02/12  HST > not done  PFTs severe airflow obst with reversible component but FEV1/FVC only 0.65 c/w restictive component   Admit date: 12/07/2022 Discharge date: 12/11/2022     PCP: Kirstie Peri, MD   DISCHARGE DIAGNOSES:    Acute respiratory failure with hypoxia (HCC)   AKI (acute kidney injury) (HCC)   Essential hypertension   Long term current use of anticoagulant   Atrial fibrillation (HCC)   History of pulmonary embolism   Obesity (BMI 30-39.9)   Acute on chronic diastolic heart failure (HCC)   RVF (right ventricular failure) (HCC)     RECOMMENDATIONS FOR OUTPATIENT FOLLOW UP: Home oxygen has been ordered Cardiology to schedule outpatient follow-up Patient to follow-up with his pulmonologist       Home Health: None Equipment/Devices: Home oxygen   CODE STATUS: Full code   DISCHARGE CONDITION: fair   Diet recommendation: Heart healthy   INITIAL HISTORY: 68 year old male, lives alone, independent, medical history significant for chronic systolic CHF, A-fib, pulmonary embolism on Eliquis, HTN, HLD, obesity, chronic iron deficiency anemia, alcohol use disorder, who presented to the ED on 12/07/2022 with complaints of progressive dyspnea on exertion with minimal activity, progressive lower extremity edema, weight gain possibly up to 30 pounds, abdominal distention but no chest pain. Reports dietary indiscretion and eating salty food and consuming alcohol. In the ED, oxygen saturations in the 79% on room air, required 6 L/min [place] oxygen to bring up saturations to 92%. Overnight of admission, required BiPAP for acute on chronic hypoxic and hypercapnic respiratory failure. Admitted for acute on chronic diastolic CHF, acute on chronic hypoxic and  hypercapnic respiratory failure.      HOSPITAL COURSE:    Acute on suspected chronic hypoxic and hypercapnic respiratory failure Multifactorial due to decompensated CHF complicating underlying suspected OSA/OHS, ?? COPD/asthma. VBG on admission: pH 7.26, pCO2 111, pO2 <31. Presented with oxygen saturation of 79% on room air on admission. Patient required BiPAP in the night of admission but then transitioned to nasal cannula. Has seen pulmonology in July 2024, assessed to have biventricular heart failure, remote PE, negative collagen studies, non-smoker, planned for home sleep testing.  Will allow this to be pursued further in the outpatient setting.  Respiratory status is stable for the most part.  Home oxygen has been ordered.   Acute on chronic diastolic CHF Echocardiogram  EF 50 to 55%.  Moderate LVH was noted.  Previously he has had a EF as low as 40 to 45%. HS Troponin 31 > 30, minimally elevated, flat trend, no chest pain, suspect demand ischemia. Given IV furosemide.  Transition to oral diuretics. Entresto was placed on hold.  Was not supposed to be discharged on this.  However looks like this has been resumed by the pharmacist at discharge.  Not clear who directed this pharmacist to resume this medication.   A-fib with controlled ventricular rate: Continue carvedilol and apixaban.   Acute kidney injury complicating stage II CKD/hyperkalemia Last known creatinine 1.17 in May 23.   Presented with creatinine of 1.65,?  Cardiorenal syndrome Renal function stable for the most part.    Hypernatremia, mild Sodium level has improved   Thrombocytopenia Stable.  Essential hypertension   History of pulmonary embolism Continue Eliquis.  No DVT on Doppler studies.   Alcohol use disorder Does not appear to be forthcoming about the exact amount of alcohol he consumes. CIWA protocol. No evidence for withdrawal currently.   Suspected sleep apnea Outpatient follow-up with  pulmonology for sleep study. CPAP at bedtime   Aortic dilatation Noted on echocardiogram.  Outpatient monitoring.   Pulmonary hypertension Noted on echocardiogram.  RV function is reduced.  Seen on previous echo as well.  Outpatient follow-up with pulmonology.   Chronic pain Continue prior home dose of opioids.     Body mass index is 36.78 kg/m./Obesity Lifestyle modifications and weight loss as outpatient.     History of Present Illness  01/06/2023  Pulmonary/ 1st office eval/ Slayton Lub[facility] / [name] new start last admit  Chief Complaint  Patient presents with   Hospitalization Follow-up  Dyspnea:  baseline still  doing food lion,  several aisles s stopping and s 02 and has not resumed since d/c Cough: none  Sleep: bed is flat/ 2 pillows  SABA use: none  02: 2lpm hs / prn daytime but not titrating as does not have pulse ox Rec Oxygen:  2lpm sleeping, 2lpm sitting, 3 lpm walking  and Make sure you check your oxygen saturation  AT  your highest level of activity (not after you stop)   to be sure it stays over 90%   My office will be contacting you by phone for referral for ONO  on 2lpm 0xygen   Trelegy 100  x  1 click 1st thing in am and take at least two good forceful drags Work on inhaler technique:    Allergy screen  01/06/23 >  Eos 0.1/  IgE  113   02/18/2023  f/u ov/[place] office/Joelie Schou re: asthma plus restriction from obesity/ LVH with diastolic dysfunction and elevated PAS  maint on prn trelegy   Chief Complaint  Patient presents with   Follow-up    Breathing is much improved since the last visit. Denies any new respiratory co's.   Dyspnea:  walking around neighborhood 20 min daily - stops after 10 min/ ? Some better with trelegy 100 but not using as maint rx  Cough: none  Sleeping: flat bed/ 2 pillows s  resp cc  SABA use: none  02: 2lpm  prn but usually not using Rec Trelegy 100  one click AM x 4 week sample and fill prescription if you feel it helps your  breathing Make sure you check your oxygen saturation  AT  your highest level of activity (not after you stop)   to be sure it stays over 90% and adjust  02 flow upward to maintain this level if needed but remember to turn it back to previous settings when you stop (to conserve your supply).  My office will be contacting you by phone for referral to Overnight oxygen on NO oxygen    Please schedule a follow up office visit in 6 weeks, call sooner if needed with all medications /inhalers/ solutions/ portable 02  in hand so we can verify exactly what you are taking. This includes all medications from all doctors and over the counters     04/01/2023  f/u ov/[place] office/Luanna Weesner re: *** maint on ***  No chief complaint on file.   Dyspnea:  *** Cough: *** Sleeping: ***   resp cc  SABA use: *** 02: ***  Lung cancer screening: ***   No obvious day to  day or daytime variability or assoc excess/ purulent sputum or mucus plugs or hemoptysis or cp or chest tightness, subjective wheeze or overt sinus or hb symptoms.    Also denies any obvious fluctuation of symptoms with weather or environmental changes or other aggravating or alleviating factors except as outlined above   No unusual exposure hx or h/o childhood pna/ asthma or knowledge of premature birth.  Current Allergies, Complete Past Medical History, Past Surgical History, Family History, and Social History were reviewed in [employer] record.  ROS  The following are not active complaints unless bolded Hoarseness, sore throat, dysphagia, dental problems, itching, sneezing,  nasal congestion or discharge of excess mucus or purulent secretions, ear ache,   fever, chills, sweats, unintended wt loss or wt gain, classically pleuritic or exertional cp,  orthopnea pnd or arm/hand swelling  or leg swelling, presyncope, palpitations, abdominal pain, anorexia, nausea, vomiting, diarrhea  or change in bowel habits or change in  bladder habits, change in stools or change in urine, dysuria, hematuria,  rash, arthralgias, visual complaints, headache, numbness, weakness or ataxia or problems with walking or coordination,  change in mood or  memory.        No outpatient medications have been marked as taking for the 04/01/23 encounter (Appointment) with Nyoka Cowden, MD.              Past Medical History:  Diagnosis Date   Acute pulmonary embolism (HCC) 02/16/2009   Alcohol abuse    Atrial flutter (HCC)    CHF (congestive heart failure) (HCC)    a. EF 40-45% in 2011 b. EF at 60-65% in 04/2015 c. EF at 45-50% by echo in 09/2020   Dysrhythmia    Hypertension    Lower extremity cellulitis    bilateral   Systolic dysfunction       Objective:    Wts  04/01/2023        ***  02/18/2023         248   01/12/23 256 lb (116.1 kg)  01/06/23 258 lb (117 kg)  12/17/22 269 lb 12.8 oz (122.4 kg)    Vital signs reviewed  04/01/2023  - Note at rest 02 sats  ***% on ***   General appearance:    ***    poor dentition                Assessment

## 2023-04-01 ENCOUNTER — Ambulatory Visit: Payer: 59 | Admitting: Internal Medicine

## 2023-04-01 ENCOUNTER — Encounter: Payer: Self-pay | Admitting: Internal Medicine

## 2023-04-09 ENCOUNTER — Ambulatory Visit (HOSPITAL_COMMUNITY)
Admission: RE | Admit: 2023-04-09 | Discharge: 2023-04-09 | Disposition: A | Discharge status: Home / Self Care | Payer: 59 | Source: Ambulatory Visit | Attending: Internal Medicine | Admitting: Internal Medicine

## 2023-04-09 DIAGNOSIS — I272 Pulmonary hypertension, unspecified: Secondary | ICD-10-CM | POA: Insufficient documentation

## 2023-04-09 LAB — ECHOCARDIOGRAM COMPLETE
AR max vel: 1.9 cm2
AV Area VTI: 1.78 cm2
AV Area mean vel: 1.79 cm2
AV Mean grad: 3.6 mm[Hg]
AV Peak grad: 6.1 mm[Hg]
Ao pk vel: 1.23 m/s
Area-P 1/2: 5.97 cm2
S' Lateral: 3.3 cm

## 2023-04-09 NOTE — Progress Notes (Signed)
*  PRELIMINARY RESULTS* Echocardiogram 2D Echocardiogram has been performed.  Stacey Drain 04/09/2023, 11:32 AM

## 2023-04-16 ENCOUNTER — Encounter (HOSPITAL_COMMUNITY)
Admission: RE | Disposition: A | Discharge status: Home / Self Care | Payer: Self-pay | Source: Home / Self Care | Attending: Internal Medicine

## 2023-04-16 ENCOUNTER — Other Ambulatory Visit: Payer: Self-pay

## 2023-04-16 ENCOUNTER — Encounter (HOSPITAL_COMMUNITY): Payer: Self-pay | Admitting: Internal Medicine

## 2023-04-16 ENCOUNTER — Ambulatory Visit (HOSPITAL_COMMUNITY)
Admission: RE | Admit: 2023-04-16 | Discharge: 2023-04-16 | Disposition: A | Discharge status: Home / Self Care | Payer: 59 | Attending: Internal Medicine | Admitting: Internal Medicine

## 2023-04-16 DIAGNOSIS — I2729 Other secondary pulmonary hypertension: Secondary | ICD-10-CM | POA: Diagnosis present

## 2023-04-16 DIAGNOSIS — I2781 Cor pulmonale (chronic): Secondary | ICD-10-CM | POA: Diagnosis not present

## 2023-04-16 DIAGNOSIS — R0683 Snoring: Secondary | ICD-10-CM | POA: Diagnosis not present

## 2023-04-16 DIAGNOSIS — R5383 Other fatigue: Secondary | ICD-10-CM | POA: Diagnosis not present

## 2023-04-16 DIAGNOSIS — Z79899 Other long term (current) drug therapy: Secondary | ICD-10-CM | POA: Insufficient documentation

## 2023-04-16 DIAGNOSIS — F1010 Alcohol abuse, uncomplicated: Secondary | ICD-10-CM | POA: Insufficient documentation

## 2023-04-16 DIAGNOSIS — Z7901 Long term (current) use of anticoagulants: Secondary | ICD-10-CM | POA: Insufficient documentation

## 2023-04-16 DIAGNOSIS — E669 Obesity, unspecified: Secondary | ICD-10-CM | POA: Diagnosis not present

## 2023-04-16 DIAGNOSIS — I110 Hypertensive heart disease with heart failure: Secondary | ICD-10-CM | POA: Diagnosis not present

## 2023-04-16 DIAGNOSIS — I5022 Chronic systolic (congestive) heart failure: Secondary | ICD-10-CM | POA: Diagnosis not present

## 2023-04-16 DIAGNOSIS — Z6834 Body mass index (BMI) 34.0-34.9, adult: Secondary | ICD-10-CM | POA: Insufficient documentation

## 2023-04-16 DIAGNOSIS — Z86711 Personal history of pulmonary embolism: Secondary | ICD-10-CM | POA: Insufficient documentation

## 2023-04-16 DIAGNOSIS — I4821 Permanent atrial fibrillation: Secondary | ICD-10-CM | POA: Insufficient documentation

## 2023-04-16 DIAGNOSIS — E785 Hyperlipidemia, unspecified: Secondary | ICD-10-CM | POA: Diagnosis not present

## 2023-04-16 DIAGNOSIS — I272 Pulmonary hypertension, unspecified: Secondary | ICD-10-CM

## 2023-04-16 DIAGNOSIS — J988 Other specified respiratory disorders: Secondary | ICD-10-CM | POA: Insufficient documentation

## 2023-04-16 HISTORY — PX: RIGHT HEART CATH: CATH118263

## 2023-04-16 LAB — POCT I-STAT EG7
Acid-Base Excess: 6 mmol/L — ABNORMAL HIGH (ref 0.0–2.0)
Acid-Base Excess: 4 mmol/L — ABNORMAL HIGH (ref 0.0–2.0)
Acid-Base Excess: 6 mmol/L — ABNORMAL HIGH (ref 0.0–2.0)
Acid-Base Excess: 6 mmol/L — ABNORMAL HIGH (ref 0.0–2.0)
Bicarbonate: 33.5 mmol/L — ABNORMAL HIGH (ref 20.0–28.0)
Bicarbonate: 31.6 mmol/L — ABNORMAL HIGH (ref 20.0–28.0)
Bicarbonate: 33.2 mmol/L — ABNORMAL HIGH (ref 20.0–28.0)
Bicarbonate: 33.3 mmol/L — ABNORMAL HIGH (ref 20.0–28.0)
Calcium, Ion: 1.2 mmol/L (ref 1.15–1.40)
Calcium, Ion: 1.03 mmol/L — ABNORMAL LOW (ref 1.15–1.40)
Calcium, Ion: 1.17 mmol/L (ref 1.15–1.40)
Calcium, Ion: 1.2 mmol/L (ref 1.15–1.40)
HCT: 47 % (ref 39.0–52.0)
HCT: 43 % (ref 39.0–52.0)
HCT: 46 % (ref 39.0–52.0)
HCT: 47 % (ref 39.0–52.0)
Hemoglobin: 16 g/dL (ref 13.0–17.0)
Hemoglobin: 14.6 g/dL (ref 13.0–17.0)
Hemoglobin: 15.6 g/dL (ref 13.0–17.0)
Hemoglobin: 16 g/dL (ref 13.0–17.0)
O2 Saturation: 55 %
O2 Saturation: 53 %
O2 Saturation: 56 %
O2 Saturation: 58 %
Potassium: 4.1 mmol/L (ref 3.5–5.1)
Potassium: 3.5 mmol/L (ref 3.5–5.1)
Potassium: 4 mmol/L (ref 3.5–5.1)
Potassium: 4 mmol/L (ref 3.5–5.1)
Sodium: 143 mmol/L (ref 135–145)
Sodium: 143 mmol/L (ref 135–145)
Sodium: 143 mmol/L (ref 135–145)
Sodium: 146 mmol/L — ABNORMAL HIGH (ref 135–145)
TCO2: 35 mmol/L — ABNORMAL HIGH (ref 22–32)
TCO2: 33 mmol/L — ABNORMAL HIGH (ref 22–32)
TCO2: 35 mmol/L — ABNORMAL HIGH (ref 22–32)
TCO2: 35 mmol/L — ABNORMAL HIGH (ref 22–32)
pCO2, Ven: 58.1 mmHg (ref 44–60)
pCO2, Ven: 57.3 mmHg (ref 44–60)
pCO2, Ven: 57.8 mmHg (ref 44–60)
pCO2, Ven: 59.9 mmHg (ref 44–60)
pH, Ven: 7.368 (ref 7.25–7.43)
pH, Ven: 7.35 (ref 7.25–7.43)
pH, Ven: 7.353 (ref 7.25–7.43)
pH, Ven: 7.368 (ref 7.25–7.43)
pO2, Ven: 31 mmHg — CL (ref 32–45)
pO2, Ven: 30 mmHg — CL (ref 32–45)
pO2, Ven: 31 mmHg — CL (ref 32–45)
pO2, Ven: 33 mmHg (ref 32–45)

## 2023-04-16 SURGERY — RIGHT HEART CATH
Anesthesia: LOCAL

## 2023-04-16 MED ORDER — ONDANSETRON HCL 4 MG/2ML IJ SOLN: 4.0000 mg | Freq: Four times a day (QID) | INTRAMUSCULAR | Status: DC | PRN

## 2023-04-16 MED ORDER — SODIUM CHLORIDE 0.9 % IV SOLN: 250.0000 mL | INTRAVENOUS | Status: DC | PRN

## 2023-04-16 MED ORDER — HYDRALAZINE HCL 20 MG/ML IJ SOLN: 10.0000 mg | INTRAMUSCULAR | Status: DC | PRN

## 2023-04-16 MED ORDER — SODIUM CHLORIDE 0.9% FLUSH: 3.0000 mL | Freq: Two times a day (BID) | INTRAVENOUS | Status: DC

## 2023-04-16 MED ORDER — HEPARIN (PORCINE) IN NACL 1000-0.9 UT/500ML-% IV SOLN
INTRAVENOUS | Status: DC | PRN
  Administered 2023-04-16: 500 mL

## 2023-04-16 MED ORDER — LIDOCAINE HCL (PF) 1 % IJ SOLN
INTRAMUSCULAR | Status: AC
  Filled 2023-04-16: qty 30

## 2023-04-16 MED ORDER — SODIUM CHLORIDE 0.9% FLUSH: 3.0000 mL | INTRAVENOUS | Status: DC | PRN

## 2023-04-16 MED ORDER — LIDOCAINE HCL (PF) 1 % IJ SOLN
INTRAMUSCULAR | Status: DC | PRN
  Administered 2023-04-16: 1 mL

## 2023-04-16 MED ORDER — ACETAMINOPHEN 325 MG PO TABS: 650.0000 mg | ORAL_TABLET | ORAL | Status: DC | PRN

## 2023-04-16 MED ORDER — LABETALOL HCL 5 MG/ML IV SOLN: 10.0000 mg | INTRAVENOUS | Status: DC | PRN

## 2023-04-16 MED ORDER — SODIUM CHLORIDE 0.9 % IV SOLN: INTRAVENOUS | Status: DC

## 2023-04-16 SURGICAL SUPPLY — 5 items
CATH SWAN GANZ 7F STRAIGHT (CATHETERS) IMPLANT
GLIDESHEATH SLENDER 7FR .021G (SHEATH) IMPLANT
PACK CARDIAC CATHETERIZATION (CUSTOM PROCEDURE TRAY) ×1 IMPLANT
TRANSDUCER W/STOPCOCK (MISCELLANEOUS) IMPLANT
TUBING ART PRESS 72 MALE/FEM (TUBING) IMPLANT

## 2023-04-16 NOTE — Interval H&P Note (Signed)
 History and Physical Interval Note:  04/16/2023 8:27 AM  Dennis Swanson  has presented today for surgery, with the diagnosis of PAH.  The various methods of treatment have been discussed with the patient and family. After consideration of risks, benefits and other options for treatment, the patient has consented to  Procedure(s): RIGHT HEART CATH (N/A) as a surgical intervention.  The patient's history has been reviewed, patient examined, no change in status, stable for surgery.  I have reviewed the patient's chart and labs.  Questions were answered to the patient's satisfaction.     [name]

## 2023-04-16 NOTE — Discharge Instructions (Addendum)

## 2023-04-21 ENCOUNTER — Encounter: Payer: Self-pay | Admitting: Student

## 2023-04-21 ENCOUNTER — Ambulatory Visit: Payer: 59 | Attending: Student | Admitting: Student

## 2023-04-21 NOTE — Progress Notes (Deleted)
 Cardiology Office Note    Date:  04/21/2023  ID:  Dennis, Swanson 12-16-1955, MRN [id number] Cardiologist: Dina Rich, MD   AHF: Dr. Gala Romney  History of Present Illness:    Dennis Swanson is a 68 y.o. male with past medical history of permanent atrial fibrillation, HFimpEF (EF 40-45% in 2011, at 60-65% in 04/2015 and at 45-50% by echo in 09/2020 and 50-55% in 12/2021), RV dysfunction, mitral regurgitation, HTN, history of GIB, HLD, history of PE and history of alcohol abuse who presents to the office today for 38-month follow-up.  He was last examined by myself in 12/2022 following a recent hospitalization for an acute CHF exacerbation. He reported his breathing had been stable and weight had improved to 256 lbs. He was continued on Coreg 3.125 mg twice daily, Farxiga 10 mg daily and Entresto 24-26 mg twice daily. He was listed as being on Lasix but should have been on Torsemide 20 mg daily, therefore this was adjusted. He did have upcoming follow-up with Advanced Heart Failure and was encouraged to keep this as he would likely benefit from an RHC given his RV failure and pulmonary hypertension.  He did meet with Dr. Gala Romney in 03/2023 and was using his exercise bike a few times a day for 20 minutes at a time and noted improvement in his respiratory status. He had lost 30 pounds as well.  Was recommended to proceed with an RHC and this was performed on 04/16/2023. This showed significant PAH with cor pulmonale and was recommended to consider targeted PAH therapies.  - reschedule CTA Aorta - F/U DB  ROS: ***  Studies Reviewed:   EKG: EKG is*** ordered today and demonstrates ***   EKG Interpretation Date/Time:    Ventricular Rate:    PR Interval:    QRS Duration:    QT Interval:    QTC Calculation:   R Axis:      Text Interpretation:         Echocardiogram: 03/2023 IMPRESSIONS     1. Left ventricular ejection fraction, by estimation, is 50 to 55%. The   left ventricle has low normal function. The left ventricle has no regional  wall motion abnormalities. There is moderate left ventricular hypertrophy.  Left ventricular diastolic  parameters are indeterminate. There is the interventricular septum is  flattened in systole, consistent with right ventricular pressure overload.   2. Not able to estimate PASP, IVC poorly visualized. . Right ventricular  systolic function moderately to severely enlarged. The right ventricular  size is moderately to severely enlarged.   3. Left atrial size was severely dilated.   4. Right atrial size was severely dilated.   5. The mitral valve is normal in structure. Trivial mitral valve  regurgitation. No evidence of mitral stenosis.   6. The tricuspid valve is abnormal.   7. The aortic valve is tricuspid. There is mild calcification of the  aortic valve. There is mild thickening of the aortic valve. Aortic valve  regurgitation is not visualized. No aortic stenosis is present.    RHC: 03/2023 Findings:    Rest   RA = 3 RV = 37/4 PA =  41/20 (28) PCW = 7 Fick cardiac output/index = 3.9/1.7 TD CO/CI = 4.2/1.8 PVR = 5.4 (fick) 5.1 (TD) Ao sat = 92% PA sat = 55%, 56% PAPi = 7   After 250 cc IVF   RA =  4 PA 57/21 = (39) PCW = 8 Fick cardiac output/index = 3.9/1.7  TD CO/CI = 5.2/2.2 PVR = 7.9 (fick) 5.9 (TD) Ao sat = 94% PA sat = 58% PAPi = 9   Assessment: 1. Mild to moderate PAH with moderate to severely reduced CO and elevated PVR 2. Normal PCWP   Plan/Discussion:    He has significant PAH with evidence of cor pulmonale (though PAPi surprisingly normal). Will need to consider targeted PAH therapies.     Risk Assessment/Calculations:   {Does this patient have ATRIAL FIBRILLATION?:[phone]} No BP recorded.  {Refresh Note OR Click here to enter BP  :1}***         Physical Exam:   VS:  There were no vitals taken for this visit.   Wt Readings from Last 3 Encounters:  04/16/23  252 lb (114.3 kg)  03/22/23 250 lb 6.4 oz (113.6 kg)  02/18/23 248 lb (112.5 kg)     GEN: Well nourished, well developed in no acute distress NECK: No JVD; No carotid bruits CARDIAC: ***RRR, no murmurs, rubs, gallops RESPIRATORY:  Clear to auscultation without rales, wheezing or rhonchi  ABDOMEN: Appears non-distended. No obvious abdominal masses. EXTREMITIES: No clubbing or cyanosis. No edema.  Distal pedal pulses are 2+ bilaterally.   Assessment and Plan:   1. Chronic HFmrEF - ***  2. RV Failure/Pulmonary HTN - ***  3. Permanent Atrial Fibrillation - ***  4. Ascending Aortic Aneurysm -This measured 48 mm by echocardiogram in 11/2022 with CTA recommended for further assessment.  This was ordered at the time of his last visit but not obtained.  Signed, Ellsworth Lennox, PA-C

## 2023-04-25 NOTE — Progress Notes (Unsigned)
 Dennis Swanson, male    DOB: May 19, 1955    MRN: [id number]   Brief patient profile:  68  yobm  never smoker with onset sob age 68   [name] back to pulmonary clinic in [place]  01/06/2023    [name] rec 09/02/12  HST > not done  PFTs severe airflow obst with reversible component but FEV1/FVC only 0.65 c/w restictive component   Admit date: 12/07/2022 Discharge date: 12/11/2022     PCP: Kirstie Peri, MD   DISCHARGE DIAGNOSES:    Acute respiratory failure with hypoxia (HCC)   AKI (acute kidney injury) (HCC)   Essential hypertension   Long term current use of anticoagulant   Atrial fibrillation (HCC)   History of pulmonary embolism   Obesity (BMI 30-39.9)   Acute on chronic diastolic heart failure (HCC)   RVF (right ventricular failure) (HCC)     RECOMMENDATIONS FOR OUTPATIENT FOLLOW UP: Home oxygen has been ordered Cardiology to schedule outpatient follow-up Patient to follow-up with his pulmonologist       Home Health: None Equipment/Devices: Home oxygen   CODE STATUS: Full code   DISCHARGE CONDITION: fair   Diet recommendation: Heart healthy   INITIAL HISTORY: 68 year old male, lives alone, independent, medical history significant for chronic systolic CHF, A-fib, pulmonary embolism on Eliquis, HTN, HLD, obesity, chronic iron deficiency anemia, alcohol use disorder, who presented to the ED on 12/07/2022 with complaints of progressive dyspnea on exertion with minimal activity, progressive lower extremity edema, weight gain possibly up to 30 pounds, abdominal distention but no chest pain. Reports dietary indiscretion and eating salty food and consuming alcohol. In the ED, oxygen saturations in the 79% on room air, required 6 L/min [place] oxygen to bring up saturations to 92%. Overnight of admission, required BiPAP for acute on chronic hypoxic and hypercapnic respiratory failure. Admitted for acute on chronic diastolic CHF, acute on chronic hypoxic and  hypercapnic respiratory failure.      HOSPITAL COURSE:    Acute on suspected chronic hypoxic and hypercapnic respiratory failure Multifactorial due to decompensated CHF complicating underlying suspected OSA/OHS, ?? COPD/asthma. VBG on admission: pH 7.26, pCO2 111, pO2 <31. Presented with oxygen saturation of 79% on room air on admission. Patient required BiPAP in the night of admission but then transitioned to nasal cannula. Has seen pulmonology in July 2024, assessed to have biventricular heart failure, remote PE, negative collagen studies, non-smoker, planned for home sleep testing.  Will allow this to be pursued further in the outpatient setting.  Respiratory status is stable for the most part.  Home oxygen has been ordered.   Acute on chronic diastolic CHF Echocardiogram  EF 50 to 55%.  Moderate LVH was noted.  Previously he has had a EF as low as 40 to 45%. HS Troponin 31 > 30, minimally elevated, flat trend, no chest pain, suspect demand ischemia. Given IV furosemide.  Transition to oral diuretics. Entresto was placed on hold.  Was not supposed to be discharged on this.  However looks like this has been resumed by the pharmacist at discharge.  Not clear who directed this pharmacist to resume this medication.   A-fib with controlled ventricular rate: Continue carvedilol and apixaban.   Acute kidney injury complicating stage II CKD/hyperkalemia Last known creatinine 1.17 in May 23.   Presented with creatinine of 1.65,?  Cardiorenal syndrome Renal function stable for the most part.    Hypernatremia, mild Sodium level has improved   Thrombocytopenia Stable.  Essential hypertension   History of pulmonary embolism Continue Eliquis.  No DVT on Doppler studies.   Alcohol use disorder Does not appear to be forthcoming about the exact amount of alcohol he consumes. CIWA protocol. No evidence for withdrawal currently.   Suspected sleep apnea Outpatient follow-up with  pulmonology for sleep study. CPAP at bedtime   Aortic dilatation Noted on echocardiogram.  Outpatient monitoring.   Pulmonary hypertension Noted on echocardiogram.  RV function is reduced.  Seen on previous echo as well.  Outpatient follow-up with pulmonology.   Chronic pain Continue prior home dose of opioids.     Body mass index is 36.78 kg/m./Obesity Lifestyle modifications and weight loss as outpatient.     History of Present Illness  01/06/2023  Pulmonary/ 1st office eval/ Janiesha Diehl / [name] new start last admit  Chief Complaint  Patient presents with   Hospitalization Follow-up  Dyspnea:  baseline still  doing [employer],  several aisles s stopping and s 02 and has not resumed since d/c Cough: none  Sleep: bed is flat/ 2 pillows  SABA use: none  02: 2lpm hs / prn daytime but not titrating as does not have pulse ox Rec Oxygen:  2lpm sleeping, 2lpm sitting, 3 lpm walking  and Make sure you check your oxygen saturation  AT  your highest level of activity (not after you stop)   to be sure it stays over 90%   My office will be contacting you by phone for referral for ONO  on 2lpm 0xygen   Trelegy 100  x  1 click 1st thing in am and take at least two good forceful drags Work on inhaler technique:    Allergy screen  01/06/23 >  Eos 0.1/  IgE  113   02/18/2023  f/u ov/[place] office/Nola Botkins re: asthma plus restriction from obesity/ LVH with diastolic dysfunction and elevated PAS  maint on prn trelegy   Chief Complaint  Patient presents with   Follow-up    Breathing is much improved since the last visit. Denies any new respiratory co's.   Dyspnea:  walking around neighborhood 20 min daily - stops after 10 min/ ? Some better with trelegy 100 but not using as maint rx  Cough: none  Sleeping: flat bed/ 2 pillows s  resp cc  SABA use: none  02: 2lpm  prn but usually not using Rec Trelegy 100  one click AM x 4 week sample and fill prescription if you feel it helps your  breathing Make sure you check your oxygen saturation  AT  your highest level of activity (not after you stop)   to be sure it stays over 90% and adjust  02 flow upward to maintain this level if needed but remember to turn it back to previous settings when you stop (to conserve your supply).  My office will be contacting you by phone for referral to Overnight oxygen on NO oxygen    Please schedule a follow up office visit in 6 weeks, call sooner if needed with all medications /inhalers/ solutions/ portable 02  in hand so we can verify exactly what you are taking. This includes all medications from all doctors and over the counters     04/26/2023  f/u ov/[place] office/Jamien Casanova re: *** maint on *** did *** bring meds  No chief complaint on file.   Dyspnea:  *** Cough: *** Sleeping: ***   resp cc  SABA use: *** 02: ***  Lung cancer screening: ***  No obvious day to day or daytime variability or assoc excess/ purulent sputum or mucus plugs or hemoptysis or cp or chest tightness, subjective wheeze or overt sinus or hb symptoms.    Also denies any obvious fluctuation of symptoms with weather or environmental changes or other aggravating or alleviating factors except as outlined above   No unusual exposure hx or h/o childhood pna/ asthma or knowledge of premature birth.  Current Allergies, Complete Past Medical History, Past Surgical History, Family History, and Social History were reviewed in [employer] record.  ROS  The following are not active complaints unless bolded Hoarseness, sore throat, dysphagia, dental problems, itching, sneezing,  nasal congestion or discharge of excess mucus or purulent secretions, ear ache,   fever, chills, sweats, unintended wt loss or wt gain, classically pleuritic or exertional cp,  orthopnea pnd or arm/hand swelling  or leg swelling, presyncope, palpitations, abdominal pain, anorexia, nausea, vomiting, diarrhea  or change in bowel habits  or change in bladder habits, change in stools or change in urine, dysuria, hematuria,  rash, arthralgias, visual complaints, headache, numbness, weakness or ataxia or problems with walking or coordination,  change in mood or  memory.        No outpatient medications have been marked as taking for the 04/26/23 encounter (Appointment) with Nyoka Cowden, MD.              Past Medical History:  Diagnosis Date   Acute pulmonary embolism (HCC) 02/16/2009   Alcohol abuse    Atrial flutter (HCC)    CHF (congestive heart failure) (HCC)    a. EF 40-45% in 2011 b. EF at 60-65% in 04/2015 c. EF at 45-50% by echo in 09/2020   Dysrhythmia    Hypertension    Lower extremity cellulitis    bilateral   Systolic dysfunction       Objective:    Wts  04/26/2023        ***  02/18/2023         248   01/12/23 256 lb (116.1 kg)  01/06/23 258 lb (117 kg)  12/17/22 269 lb 12.8 oz (122.4 kg)    Vital signs reviewed  04/26/2023  - Note at rest 02 sats  ***% on ***   General appearance:    ***    poor dentition                Assessment

## 2023-04-26 ENCOUNTER — Encounter: Payer: Self-pay | Admitting: Internal Medicine

## 2023-04-26 ENCOUNTER — Ambulatory Visit: Payer: 59 | Admitting: Internal Medicine

## 2023-04-26 VITALS — BP 114/74 | HR 68 | Ht 72.0 in | Wt 254.4 lb

## 2023-04-26 DIAGNOSIS — J4489 Other specified chronic obstructive pulmonary disease: Secondary | ICD-10-CM | POA: Diagnosis not present

## 2023-04-26 DIAGNOSIS — J9611 Chronic respiratory failure with hypoxia: Secondary | ICD-10-CM | POA: Diagnosis not present

## 2023-04-26 DIAGNOSIS — R0609 Other forms of dyspnea: Secondary | ICD-10-CM

## 2023-04-26 DIAGNOSIS — J9612 Chronic respiratory failure with hypercapnia: Secondary | ICD-10-CM | POA: Diagnosis not present

## 2023-04-26 NOTE — Assessment & Plan Note (Signed)
 Started on 2lpm at d/c  12/10/21  - sats 83% RA at rest 01/06/2023  - 01/06/2023  83% ROOM AIR; placed on 2L CONT and walked slow paced x 150 ft >  sat 87%, placed on 3L CONT and walked another 150 ft slow paced :   90% on 3L CONT  - HC03   01/06/23 38  with nl TSH   - 02/18/2023   Walked on RA  x  2  lap(s) =  approx 300  ft  @ mod pace, stopped due to desats to 86% p 1st lap > rec titrtate to sats > 90% with ex and check ONO on RA  -  ONO RA  03/04/23   desats x 5 h at < 89%  so rec 2lpm and repeat on 2lpm > says device still at house as of 04/26/2023  - 04/26/2023   Walked on RA  x  3  lap(s) =  approx 450  ft  @ mod to [employer], stopped due to end of study with lowest 02 sats 89% at end s cp/ sob >  rec 2lpm hs until formal sleep re-eval done    Will need wt loss program and careful f/u on sleep issues as well (very poor insight into his medical conditions/ meds)  Each maintenance medication was reviewed in detail including emphasizing most importantly the difference between maintenance and prns and under what circumstances the prns are to be triggered using an action plan format where appropriate.  Total time for H and P, chart review, counseling, reviewing dpi device(s) , directly observing portions of ambulatory 02 saturation study/ and generating customized AVS unique to this office visit / same day charting = 40 min summary  eval of complex chronic resp problems in non-adherent pt

## 2023-04-26 NOTE — Assessment & Plan Note (Signed)
 Never regular smoker - PFT's  10/20/22  FEV1 1.16 (32 % ) ratio 0.65 (LLN 0.64 )  p 17 % improvement from saba p 0 prior to study with DLCO  19.9 (71%)   and FV curve mildly concave and ERV 24 at wt 255  (vs baseline 200 most of his life)   - 01/06/2023  > try trelegy 100 one click each am > did not understand meaning of maint rx  - Allergy screen  01/06/23 >  Eos 0.1/  IgE  113 - 02/18/2023  After extensive coaching inhaler device,  effectiveness =    90% > rechallenge with trelegy x 6 week samples then ov  - 04/26/2023 reports no doe with desired activity but very sedentary with mild hypersomnolence and sleeping poorly  Suspect chronic asthma with mostly restrictive changes on pfts >  trelegy 100 best choice for now

## 2023-04-26 NOTE — Patient Instructions (Signed)
 Use oxygen 2lpm at bedtime every night whether you are sleeping in a recliner or not   Keep up appointments for your sleep evaluation    Please schedule a follow up office visit in 6 weeks, call sooner if needed with all medications /inhalers/ solutions in hand so we can verify exactly what you are taking. This includes all medications from all doctors and over the counters

## 2023-04-26 NOTE — Assessment & Plan Note (Signed)
 VQ  08/01/22 nl  Echo 12/08/22 1. Left ventricular ejection fraction, by estimation, is 50 to 55%. Left  ventricular ejection fraction by 3D volume is 58 %. The left ventricle has  low normal function. The left ventricle has no regional wall motion  abnormalities. There is moderate left ventricular hypertrophy. Left ventricular diastolic parameters are  indeterminate. There is the interventricular septum is flattened in  systole and diastole, consistent with right ventricular pressure and  volume overload.   2. Right ventricular systolic function is severely reduced. The right  ventricular size is severely enlarged. There is severely elevated  pulmonary artery systolic pressure. The estimated right ventricular  systolic pressure is 71.0 mmHg.   3. Right atrial size was severely dilated.   4. The mitral valve is normal in structure. Trivial mitral valve  regurgitation. No evidence of mitral stenosis.   5. Tricuspid valve regurgitation is severe.   6. The aortic valve is calcified. There is mild calcification of the  aortic valve. There is mild thickening of the aortic valve. Aortic valve  regurgitation is not visualized. Aortic valve sclerosis is present, with  no evidence of aortic valve stenosis.   7. Aortic dilatation noted. Aneurysm of the ascending aorta, measuring 48  mm. There is moderate dilatation of the ascending aorta, measuring 48 mm.   8. The inferior vena cava is dilated in size with <50% respiratory  variability, suggesting right atrial pressure of 15 mmHg.  - RHC  04/16/23  RA =  4 PA 57/21 = (39) PCW = 8 Fick cardiac output/index = 3.9/1.7 TD CO/CI = 5.2/2.2 PVR = 7.9 (fick) 5.9 (TD)  Strongly suspect this is all chronic and related to OHS/ OSA with wt 50 lbs over baseline so needs to complete the sleep eval this time and defer this either to cards or sleep medicine but emphasized to him that he keep up his appts as this is critically important

## 2023-05-18 ENCOUNTER — Encounter (HOSPITAL_COMMUNITY): Admitting: Internal Medicine

## 2023-06-07 ENCOUNTER — Ambulatory Visit: Admitting: Internal Medicine

## 2023-07-02 ENCOUNTER — Other Ambulatory Visit: Payer: Self-pay | Admitting: Cardiology

## 2023-07-04 NOTE — Progress Notes (Signed)
 Dennis Swanson, male    DOB: Apr 12, 1955    MRN: [id number]   Brief patient profile:  31  yobm  never smoker with onset sob age 68   Dennis Swanson pt self referred back to pulmonary clinic in [facility]  01/06/2023   reports baseline wt around 200   Dennis Swanson rec 09/02/12  HST > not done  PFTs severe airflow obst with reversible component but FEV1/FVC only 0.65 c/w restictive component   Admit date: 12/07/2022 Discharge date: 12/11/2022     PCP: Theoplis Fix, MD   DISCHARGE DIAGNOSES:    Acute respiratory failure with hypoxia (HCC)   AKI (acute kidney injury) (HCC)   Essential hypertension   Long term current use of anticoagulant   Atrial fibrillation (HCC)   History of pulmonary embolism   Obesity (BMI 30-39.9)   Acute on chronic diastolic heart failure (HCC)   RVF (right ventricular failure) (HCC)     RECOMMENDATIONS FOR OUTPATIENT FOLLOW UP: Home oxygen  has been ordered Cardiology to schedule outpatient follow-up Patient to follow-up with his pulmonologist       Home Health: None Equipment/Devices: Home oxygen    CODE STATUS: Full code   DISCHARGE CONDITION: fair   Diet recommendation: Heart healthy   INITIAL HISTORY: 68 year old male, lives alone, independent, medical history significant for chronic systolic CHF, A-fib, pulmonary embolism on Eliquis , HTN, HLD, obesity, chronic iron deficiency anemia, alcohol use disorder, who presented to the ED on 12/07/2022 with complaints of progressive dyspnea on exertion with minimal activity, progressive lower extremity edema, weight gain possibly up to 30 pounds, abdominal distention but no chest pain. Reports dietary indiscretion and eating salty food and consuming alcohol. In the ED, oxygen  saturations in the 79% on room air, required 6 L/min [place] oxygen  to bring up saturations to 92%. Overnight of admission, required BiPAP for acute on chronic hypoxic and hypercapnic respiratory failure. Admitted for acute on chronic diastolic CHF,  acute on chronic hypoxic and hypercapnic respiratory failure.      HOSPITAL COURSE:    Acute on suspected chronic hypoxic and hypercapnic respiratory failure Multifactorial due to decompensated CHF complicating underlying suspected OSA/OHS, ?? COPD/asthma. VBG on admission: pH 7.26, pCO2 111, pO2 <31. Presented with oxygen  saturation of 79% on room air on admission. Patient required BiPAP in the night of admission but then transitioned to nasal cannula. Has seen pulmonology in July 2024, assessed to have biventricular heart failure, remote PE, negative collagen studies, non-smoker, planned for home sleep testing.  Will allow this to be pursued further in the outpatient setting.  Respiratory status is stable for the most part.  Home oxygen  has been ordered.   Acute on chronic diastolic CHF Echocardiogram  EF 50 to 55%.  Moderate LVH was noted.  Previously he has had a EF as low as 40 to 45%. HS Troponin 31 > 30, minimally elevated, flat trend, no chest pain, suspect demand ischemia. Given IV furosemide .  Transition to oral diuretics. Entresto  was placed on hold.  Was not supposed to be discharged on this.  However looks like this has been resumed by the pharmacist at discharge.  Not clear who directed this pharmacist to resume this medication.   A-fib with controlled ventricular rate: Continue carvedilol  and apixaban .   Acute kidney injury complicating stage II CKD/hyperkalemia Last known creatinine 1.17 in May 23.   Presented with creatinine of 1.65,?  Cardiorenal syndrome Renal function stable for the most part.    Hypernatremia, mild Sodium level has improved  Thrombocytopenia Stable.   Essential hypertension   History of pulmonary embolism Continue Eliquis .  No DVT on Doppler studies.   Alcohol use disorder Does not appear to be forthcoming about the exact amount of alcohol he consumes. CIWA protocol. No evidence for withdrawal currently.   Suspected sleep  apnea Outpatient follow-up with pulmonology for sleep study. CPAP at bedtime   Aortic dilatation Noted on echocardiogram.  Outpatient monitoring.   Pulmonary hypertension Noted on echocardiogram.  RV function is reduced.  Seen on previous echo as well.  Outpatient follow-up with pulmonology.   Chronic pain Continue prior home dose of opioids.     Body mass index is 36.78 kg/m./Obesity Lifestyle modifications and weight loss as outpatient.     History of Present Illness  01/06/2023  Pulmonary/ 1st office eval/ Mariajose Mow / [facility] Office new start last admit at wt around 250  Chief Complaint  Patient presents with   Hospitalization Follow-up  Dyspnea:  baseline still  doing [employer],  several aisles s stopping and s 02 and has not resumed since d/c Cough: none  Sleep: bed is flat/ 2 pillows  SABA use: none  02: 2lpm hs / prn daytime but not titrating as does not have pulse ox Rec Oxygen :  2lpm sleeping, 2lpm sitting, 3 lpm walking  and Make sure you check your oxygen  saturation  AT  your highest level of activity (not after you stop)   to be sure it stays over 90%   My office will be contacting you by phone for referral for ONO  on 2lpm 0xygen   Trelegy 100  x  1 click 1st thing in am and take at least two good forceful drags Work on inhaler technique:    Allergy screen  01/06/23 >  Eos 0.1/  IgE  113   02/18/2023  f/u ov/[place] office/Chosen Geske re: asthma plus restriction from obesity/ LVH with diastolic dysfunction and elevated PAS  maint on prn trelegy   Chief Complaint  Patient presents with   Follow-up    Breathing is much improved since the last visit. Denies any new respiratory co's.   Dyspnea:  walking around neighborhood 20 min daily - stops after 10 min/ ? Some better with trelegy 100 but not using as maint rx  Cough: none  Sleeping: flat bed/ 2 pillows s  resp cc  SABA use: none  02: 2lpm  prn but usually not using Rec Trelegy 100  one click AM x 4 week sample  and fill prescription if you feel it helps your breathing Make sure you check your oxygen  saturation  AT  your highest level of activity (not after you stop)   to be sure it stays over 90%    Please schedule a follow up office visit in 6 weeks, call sooner if needed with all medications /inhalers/ solutions/ portable 02  in hand     ONO RA  03/04/23   desats x 5 h at < 89%  so rec 2lpm and repeat on 2lpm    04/26/2023  f/u ov/[place] office/Albertine Lafoy re: asthma plus restriction from obesity/ LVH with diastolic dysfunction and elevated PAS maint on trelegy  did not  bring meds or 02  (says on trelegy but doesn't have rx, just samples) Chief Complaint  Patient presents with   Follow-up    6 week follow up all meds   Dyspnea:  Not limited by breathing from desired activities  but very sedentary  Cough: none  Sleeping: bed is  flat one pillow s  resp cc / does not feel rested and sleeping during day/ repeat sleep eval scheduled for later this month SABA use: none 02: not used in 5 months Rec Use oxygen  2lpm at bedtime every night whether you are sleeping in a recliner or not  Keep up appointments for your sleep evaluation  Please schedule a follow up office visit in 6 weeks, call sooner if needed with all medications /inhalers/ solutions in hand    07/07/2023  f/u ov/[place] office/Mariah Harn re: chronic asthma  maint on Trelegy  did not  bring meds / still on entresto   Chief Complaint  Patient presents with   Asthma  Dyspnea:  work out 30 min x 30 min  Cough: none  Sleeping: flat bed/ 2 pillows s   resp cc  SABA use: none  02: not consistent with it   No obvious day to day or daytime variability or assoc excess/ purulent sputum or mucus plugs or hemoptysis or cp or chest tightness, subjective wheeze or overt sinus or hb symptoms.    Also denies any obvious fluctuation of symptoms with weather or environmental changes or other aggravating or alleviating factors except as outlined above    No unusual exposure hx or h/o childhood pna/ asthma or knowledge of premature birth.  Current Allergies, Complete Past Medical History, Past Surgical History, Family History, and Social History were reviewed in [employer] record.  ROS  The following are not active complaints unless bolded Hoarseness, sore throat, dysphagia, dental problems, itching, sneezing,  nasal congestion or discharge of excess mucus or purulent secretions, ear ache,   fever, chills, sweats, unintended wt loss or wt gain, classically pleuritic or exertional cp,  orthopnea pnd or arm/hand swelling  or leg swelling, presyncope, palpitations, abdominal pain, anorexia, nausea, vomiting, diarrhea  or change in bowel habits or change in bladder habits, change in stools or change in urine, dysuria, hematuria,  rash, arthralgias, visual complaints, headache, numbness, weakness or ataxia or problems with walking or coordination,  change in mood or  memory.        Current Meds  Medication Sig   apixaban  (ELIQUIS ) 5 MG TABS tablet Take 1 tablet (5 mg total) by mouth 2 (two) times daily.   carvedilol  (COREG ) 3.125 MG tablet Take 1 tablet (3.125 mg total) by mouth 2 (two) times daily with a meal.   dapagliflozin  propanediol (FARXIGA ) 10 MG TABS tablet Take 1 tablet (10 mg total) by mouth daily.   Fluticasone-Umeclidin-Vilant (TRELEGY ELLIPTA ) 100-62.5-25 MCG/ACT AEPB Inhale 1 puff into the lungs daily.   furosemide  (LASIX ) 40 MG tablet Take 40 mg by mouth 2 (two) times daily.   naloxone (NARCAN) nasal spray 4 mg/0.1 mL Place 0.4 mg into the nose once.   oxyCODONE -acetaminophen  (PERCOCET) 10-325 MG tablet Take 1 tablet by mouth every 6 (six) hours as needed for pain.   pantoprazole  (PROTONIX ) 40 MG tablet Take 1 tablet (40 mg total) by mouth daily before breakfast.   potassium chloride  SA (KLOR-CON  M) 20 MEQ tablet Take 1 tablet (20 mEq total) by mouth daily.   sacubitril -valsartan  (ENTRESTO ) 24-26 MG Take 1  tablet by mouth 2 (two) times daily.   thiamine  (VITAMIN B1) 100 MG tablet Take 1 tablet (100 mg total) by mouth daily.   torsemide  (DEMADEX ) 20 MG tablet Take 20 mg by mouth daily.               Past Medical History:  Diagnosis Date  Acute pulmonary embolism (HCC) 02/16/2009   Alcohol abuse    Atrial flutter (HCC)    CHF (congestive heart failure) (HCC)    a. EF 40-45% in 2011 b. EF at 60-65% in 04/2015 c. EF at 45-50% by echo in 09/2020   Dysrhythmia    Hypertension    Lower extremity cellulitis    bilateral   Systolic dysfunction       Objective:    Wts  07/07/2023       244  04/26/2023       254  02/18/2023         248   01/12/23 256 lb (116.1 kg)  01/06/23 258 lb (117 kg)  12/17/22 269 lb 12.8 oz (122.4 kg)    Vital signs reviewed  07/07/2023  - Note at rest 02 sats  91% on RA   General appearance:    amb pleasant amb bm nad   HEENT : Oropharynx  clear   Nasal turbinates nl    NECK :  without  apparent JVD/ palpable Nodes/TM    LUNGS: no acc muscle use,  Mild barrel  contour chest wall with bilateral  Distant bs s audible wheeze and  without cough on insp or exp maneuvers  and mild  Hyperresonant  to  percussion bilaterally     CV:  RRR  no s3 or murmur /  slt increase in P2, and no edema   ABD:  soft and nontender with pos end  insp Hoover's  in the supine position.  No bruits or organomegaly appreciated   MS:  Nl gait/ ext warm without deformities Or obvious joint restrictions  calf tenderness, cyanosis or clubbing     SKIN: warm and dry without lesions    NEURO:  alert, approp, nl sensorium with  no motor or cerebellar deficits apparent.        Assessment

## 2023-07-07 ENCOUNTER — Encounter: Payer: Self-pay | Admitting: Internal Medicine

## 2023-07-07 ENCOUNTER — Ambulatory Visit (INDEPENDENT_AMBULATORY_CARE_PROVIDER_SITE_OTHER): Admitting: Internal Medicine

## 2023-07-07 VITALS — BP 122/86 | HR 86 | Ht 72.0 in | Wt 244.4 lb

## 2023-07-07 DIAGNOSIS — J4489 Other specified chronic obstructive pulmonary disease: Secondary | ICD-10-CM

## 2023-07-07 DIAGNOSIS — J45909 Unspecified asthma, uncomplicated: Secondary | ICD-10-CM | POA: Diagnosis not present

## 2023-07-07 NOTE — Assessment & Plan Note (Addendum)
 Never regular smoker - PFT's  10/20/22  FEV1 1.16 (32 % ) ratio 0.65 (LLN 0.64 )  p 17 % improvement from saba p 0 prior to study with DLCO  19.9 (71%)   and FV curve mildly concave and ERV 24 at wt 255  (vs baseline 200 most of his life)   - 01/06/2023  > try trelegy 100 one click each am > did not understand meaning of maint rx  - Allergy screen  01/06/23 >  Eos 0.1/  IgE  113 - 02/18/2023  After extensive coaching inhaler device,  effectiveness =    90% > rechallenge with trelegy x 6 week samples then ov  - 04/26/2023 reports no doe with desired activity but very sedentary with mild hypersomnolence and sleeping poorly> referred to sleep medicine   All goals of chronic asthma control met including optimal function and elimination of symptoms with minimal need for rescue therapy.  Contingencies discussed in full including contacting this office immediately if not controlling the symptoms using the rule of two's.     F/u q 6 m          Each maintenance medication was reviewed in detail including emphasizing most importantly the difference between maintenance and prns and under what circumstances the prns are to be triggered using an action plan format where appropriate.  Total time for H and P, chart review, counseling, reviewing DPI/ elipta  device(s) and generating customized AVS unique to this office visit / same day charting = 24 min

## 2023-07-07 NOTE — Patient Instructions (Signed)
 No change in medications    Please schedule a follow up visit in 6  months but call sooner if needed

## 2023-07-29 ENCOUNTER — Encounter: Payer: Self-pay | Admitting: Podiatry

## 2023-07-29 ENCOUNTER — Ambulatory Visit: Admitting: Podiatry

## 2023-07-29 DIAGNOSIS — B351 Tinea unguium: Secondary | ICD-10-CM | POA: Diagnosis not present

## 2023-07-29 DIAGNOSIS — M79671 Pain in right foot: Secondary | ICD-10-CM

## 2023-07-29 DIAGNOSIS — M79672 Pain in left foot: Secondary | ICD-10-CM | POA: Diagnosis not present

## 2023-07-29 NOTE — Progress Notes (Signed)
 Patient presents for evaluation and treatment of tenderness and some redness around nails feet.  Tenderness around toes with walking and wearing shoes.  Physical exam:  General appearance: Alert, pleasant, and in no acute distress.  Vascular: Pedal pulses: DP 2/4 B/L, PT 0/4 B/L. Mild edema lower legs bilaterally  Neurological:  No paresthesias or burning  Dermatologic:  Nails thickened, disfigured, discolored 1-5 BL with subungual debris.  Redness and hypertrophic nail folds along nail folds bilaterally but no signs of drainage or infection.  Musculoskeletal:     Diagnosis: 1. Painful onychomycotic nails 1 through 5 bilaterally. 2. Pain toes 1 through 5 bilaterally.  Plan: Debrided onychomycotic nails 1 through 5 bilaterally.  Return 3 months

## 2023-10-22 ENCOUNTER — Other Ambulatory Visit (HOSPITAL_COMMUNITY): Payer: Self-pay

## 2023-10-22 DIAGNOSIS — I5032 Chronic diastolic (congestive) heart failure: Secondary | ICD-10-CM

## 2023-10-22 MED ORDER — DAPAGLIFLOZIN PROPANEDIOL 10 MG PO TABS: 10.0000 mg | ORAL_TABLET | Freq: Every day | ORAL | 0 refills | Status: DC

## 2023-10-22 NOTE — Telephone Encounter (Signed)
 Attempted calling patient to schedule a  appointment. However was unable to leave a voice message.

## 2023-10-28 ENCOUNTER — Ambulatory Visit: Admitting: Podiatry

## 2023-11-17 ENCOUNTER — Telehealth: Payer: Self-pay | Admitting: Internal Medicine

## 2023-11-17 NOTE — Telephone Encounter (Signed)
 Patient was a walk in today and is requesting Dr. Darlean place lab orders so his liver can be checked---patient call back is [phone]

## 2023-11-17 NOTE — Telephone Encounter (Signed)
 Patient was a walk in today and is requesting Dr. Darlean place lab orders so his liver can be checked---patient call back is [phone]     Please advise

## 2023-11-18 NOTE — Telephone Encounter (Signed)
 Would you like me to encourage pt to see his pcp for this matter ?

## 2023-11-19 NOTE — Telephone Encounter (Signed)
 Atc x1 lmtcb

## 2023-11-23 ENCOUNTER — Ambulatory Visit: Admitting: Podiatry

## 2023-11-23 DIAGNOSIS — B351 Tinea unguium: Secondary | ICD-10-CM

## 2023-11-23 DIAGNOSIS — M79671 Pain in right foot: Secondary | ICD-10-CM | POA: Diagnosis not present

## 2023-11-23 DIAGNOSIS — M79672 Pain in left foot: Secondary | ICD-10-CM | POA: Diagnosis not present

## 2023-11-23 NOTE — Progress Notes (Signed)
 Patient presents for evaluation and treatment of tenderness and some redness around nails feet.  Tenderness around toes with walking and wearing shoes.  Physical exam:  General appearance: Alert, pleasant, and in no acute distress.  Vascular: Pedal pulses: DP 2/4 B/L, PT 0/4 B/L. Mild edema lower legs bilaterally  Neu  Dermatologic:  Nails thickened, disfigured, discolored 1-5 BL with subungual debris.  Redness and hypertrophic nail folds along nail folds bilaterally but no signs of drainage or infection.  Musculoskeletal:     Diagnosis: 1. Painful onychomycotic nails 1 through 5 bilaterally. 2. Pain toes 1 through 5 bilaterally.  Plan: -Debrided onychomycotic nails 1 through 5 bilaterally.  Sharply debrided nails with nail clipper and reduced with a power bur.  Return 3 months

## 2023-11-23 NOTE — Telephone Encounter (Signed)
 Pt states one of his dr is requesting labs that is not [facility] and wanting me to send them to him but I will need a release of info form signed from the dr.  Informed pt

## 2023-12-22 ENCOUNTER — Other Ambulatory Visit (HOSPITAL_COMMUNITY): Payer: Self-pay | Admitting: Physician Assistant

## 2023-12-22 DIAGNOSIS — I5020 Unspecified systolic (congestive) heart failure: Secondary | ICD-10-CM

## 2024-01-03 ENCOUNTER — Encounter: Payer: Self-pay | Admitting: Internal Medicine

## 2024-01-03 ENCOUNTER — Ambulatory Visit: Admitting: Internal Medicine

## 2024-01-03 VITALS — BP 122/80 | HR 83 | Ht 72.0 in | Wt 254.0 lb

## 2024-01-03 DIAGNOSIS — R058 Other specified cough: Secondary | ICD-10-CM | POA: Insufficient documentation

## 2024-01-03 DIAGNOSIS — J4489 Other specified chronic obstructive pulmonary disease: Secondary | ICD-10-CM

## 2024-01-03 NOTE — Assessment & Plan Note (Addendum)
 Never regular smoker - PFT's  10/20/22  FEV1 1.16 (32 % ) ratio 0.65 (LLN 0.64 )  p 17 % improvement from saba p 0 prior to study with DLCO  19.9 (71%)   and FV curve mildly concave and ERV 24 at wt 255  (vs baseline 200 most of his life)   - 01/06/2023  > try trelegy 100 one click each am > did not understand meaning of maint rx  - Allergy screen  01/06/23 >  Eos 0.1/  IgE  113 - 02/18/2023  After extensive coaching inhaler device,  effectiveness =    90% > rechallenge with trelegy x 6 week samples then ov  - 04/26/2023 reports no doe with desired activity but very sedentary with mild hypersomnolence and sleeping poorly> referred to sleep medicine   All goals of chronic asthma control met including optimal function and elimination of symptoms with minimal need for rescue therapy.  Contingencies discussed in full including contacting this office immediately if not controlling the symptoms using the rule of two's.

## 2024-01-03 NOTE — Assessment & Plan Note (Addendum)
 Onset fall 2025 while on trelegy 100 and Entresto    Upper airway cough syndrome (previously labeled PNDS),  is so named because it's frequently impossible to sort out how much is  CR/sinusitis with freq throat clearing (which can be related to primary GERD)   vs  causing  secondary ( extra esophageal)  GERD from wide swings in gastric pressure that occur with throat clearing, often  promoting self use of mint and menthol lozenges that reduce the lower esophageal sphincter tone and exacerbate the problem further in a cyclical fashion.   These are the same pts (now being labeled as having irritable larynx syndrome by some cough centers) who not infrequently have a history of having failed to tolerate ace inhibitors(or the sacubitril  in entresto )   dry powder inhalers or biphosphonates or report having atypical/extraesophageal reflux symptoms from LPR (globus, throat clearing)  that don't respond to standard doses of PPI  and are easily confused as having aecopd or asthma flares by even experienced allergists/ pulmonologists (myself included).   >>> For now try 1st gen H1 blockers per guidelines if tol for PNDS and hard rock candy but stop the mints   F/u q 61m call sooner prn          Each maintenance medication was reviewed in detail including emphasizing most importantly the difference between maintenance and prns and under what circumstances the prns are to be triggered using an action plan format where appropriate.  Total time for H and P, chart review, counseling, reviewing dpi  device(s) and generating customized AVS unique to this office visit / same day charting = 25 min

## 2024-01-03 NOTE — Patient Instructions (Addendum)
 Keep some candy handy to prevent throat clearing    For drainage / throat tickle try take CHLORPHENIRAMINE  4 mg  extremely effective and inexpensive over the counter- may cause drowsiness so start with just a dose or two an hour before bedtime and see how you tolerate it before trying in daytime.

## 2024-01-03 NOTE — Progress Notes (Signed)
 Dennis Swanson, male    DOB: 09/11/1955    MRN: [id number]   Brief patient profile:  84  yobm  never smoker with onset sob age 68   [name] back to pulmonary clinic in [facility]  01/06/2023   reports baseline wt around 200   [name] rec 09/02/12  HST > not done  PFTs severe airflow obst with reversible component but FEV1/FVC only 0.65 c/w restictive component   Admit date: 12/07/2022 Discharge date: 12/11/2022     PCP: Dennis Isles, MD   DISCHARGE DIAGNOSES:    Acute respiratory failure with hypoxia (HCC)   AKI (acute kidney injury) (HCC)   Essential hypertension   Long term current use of anticoagulant   Atrial fibrillation (HCC)   History of pulmonary embolism   Obesity (BMI 30-39.9)   Acute on chronic diastolic heart failure (HCC)   RVF (right ventricular failure) (HCC)     RECOMMENDATIONS FOR OUTPATIENT FOLLOW UP: Home oxygen  has been ordered Cardiology to schedule outpatient follow-up Patient to follow-up with his pulmonologist       Home Health: None Equipment/Devices: Home oxygen    CODE STATUS: Full code   DISCHARGE CONDITION: fair   Diet recommendation: Heart healthy   INITIAL HISTORY: 68 year old male, lives alone, independent, medical history significant for chronic systolic CHF, A-fib, pulmonary embolism on Eliquis , HTN, HLD, obesity, chronic iron deficiency anemia, alcohol use disorder, who presented to the ED on 12/07/2022 with complaints of progressive dyspnea on exertion with minimal activity, progressive lower extremity edema, weight gain possibly up to 30 pounds, abdominal distention but no chest pain. Reports dietary indiscretion and eating salty food and consuming alcohol. In the ED, oxygen  saturations in the 79% on room air, required 6 L/min [place] oxygen  to bring up saturations to 92%. Overnight of admission, required BiPAP for acute on chronic hypoxic and hypercapnic respiratory failure. Admitted for acute on chronic diastolic CHF,  acute on chronic hypoxic and hypercapnic respiratory failure.      HOSPITAL COURSE:    Acute on suspected chronic hypoxic and hypercapnic respiratory failure Multifactorial due to decompensated CHF complicating underlying suspected OSA/OHS, ?? COPD/asthma. VBG on admission: pH 7.26, pCO2 111, pO2 <31. Presented with oxygen  saturation of 79% on room air on admission. Patient required BiPAP in the night of admission but then transitioned to nasal cannula. Has seen pulmonology in July 2024, assessed to have biventricular heart failure, remote PE, negative collagen studies, non-smoker, planned for home sleep testing.  Will allow this to be pursued further in the outpatient setting.  Respiratory status is stable for the most part.  Home oxygen  has been ordered.   Acute on chronic diastolic CHF Echocardiogram  EF 50 to 55%.  Moderate LVH was noted.  Previously he has had a EF as low as 40 to 45%. HS Troponin 31 > 30, minimally elevated, flat trend, no chest pain, suspect demand ischemia. Given IV furosemide .  Transition to oral diuretics. Entresto  was placed on hold.  Was not supposed to be discharged on this.  However looks like this has been resumed by the pharmacist at discharge.  Not clear who directed this pharmacist to resume this medication.   A-fib with controlled ventricular rate: Continue carvedilol  and apixaban .   Acute kidney injury complicating stage II CKD/hyperkalemia Last known creatinine 1.17 in May 23.   Presented with creatinine of 1.65,?  Cardiorenal syndrome Renal function stable for the most part.    Hypernatremia, mild Sodium level has improved  Thrombocytopenia Stable.   Essential hypertension   History of pulmonary embolism Continue Eliquis .  No DVT on Doppler studies.   Alcohol use disorder Does not appear to be forthcoming about the exact amount of alcohol he consumes. CIWA protocol. No evidence for withdrawal currently.   Suspected sleep  apnea Outpatient follow-up with pulmonology for sleep study. CPAP at bedtime   Aortic dilatation Noted on echocardiogram.  Outpatient monitoring.   Pulmonary hypertension Noted on echocardiogram.  RV function is reduced.  Seen on previous echo as well.  Outpatient follow-up with pulmonology.   Chronic pain Continue prior home dose of opioids.     Body mass index is 36.78 kg/m./Obesity Lifestyle modifications and weight loss as outpatient.     History of Present Illness  01/06/2023  Pulmonary/ 1st Swanson eval/ Dennis Swanson new start last admit at wt around 250  Chief Complaint  Patient presents with   Hospitalization Follow-up  Dyspnea:  baseline still  doing [employer],  several aisles s stopping and s 02 and has not resumed since d/c Cough: none  Sleep: bed is flat/ 2 pillows  SABA use: none  02: 2lpm hs / prn daytime but not titrating as does not have pulse ox Rec Oxygen :  2lpm sleeping, 2lpm sitting, 3 lpm walking  and Make sure you check your oxygen  saturation  AT  your highest level of activity (not after you stop)   to be sure it stays over 90%   My Swanson will be contacting you by phone for referral for ONO  on 2lpm 0xygen   Trelegy 100  x  1 click 1st thing in am and take at least two good forceful drags Work on inhaler technique:    Allergy screen  01/06/23 >  Eos 0.1/  IgE  113       ONO RA  03/04/23   desats x 5 h at < 89%  so rec 2lpm and repeat on 2lpm    04/26/2023  f/u ov/[place] Swanson/Dennis Swanson re: asthma plus restriction from obesity/ LVH with diastolic dysfunction and elevated PAS maint on trelegy  did not  bring meds or 02  (says on trelegy but doesn't have rx, just samples) Chief Complaint  Patient presents with   Follow-up    6 week follow up all meds   Dyspnea:  Not limited by breathing from desired activities  but very sedentary  Cough: none  Sleeping: bed is flat one pillow s  resp cc / does not feel rested and sleeping during day/  repeat sleep eval scheduled for later this month SABA use: none 02: not used in 5 months Rec Use oxygen  2lpm at bedtime every night whether you are sleeping in a recliner or not  Keep up appointments for your sleep evaluation  Please schedule a follow up Swanson visit in 6 weeks, call sooner if needed with all medications /inhalers/ solutions in hand    07/07/2023  f/u ov/[place] Swanson/Geryl Dohn re: chronic asthma  maint on Trelegy  did not  bring meds / still on entresto   Chief Complaint  Patient presents with   Asthma  Dyspnea:  work out 30 min  more arm than leg ex  Cough: none  Sleeping: flat bed/ 2 pillows s   resp cc  SABA use: none  02: not consistent with it Rec No change rx    01/03/2024 6 m  f/u ov/[place] Swanson/Cindie Rajagopalan re: chronic asthma maint on trelegy   Chief Complaint  Patient presents  with   Medical Management of Chronic Issues    he   Asthma    He has had some nasal congestion over the past wk. Overall breathing is doing well.    Dyspnea:  Not limited by breathing from desired activities   Cough: throat ticklee goes and comes x one month better after  lozenge on trelegy and entresto  Sleeping: flat bed with 2 pillows under head  s   resp cc  SABA use: none  02: concentrator prn at hs     No obvious day to day or daytime variability or assoc excess/ purulent sputum or mucus plugs or hemoptysis or cp or chest tightness, subjective wheeze or overt sinus or hb symptoms.    Also denies any obvious fluctuation of symptoms with weather or environmental changes or other aggravating or alleviating factors except as outlined above   No unusual exposure hx or h/o childhood pna/ asthma or knowledge of premature birth.  Current Allergies, Complete Past Medical History, Past Surgical History, Family History, and Social History were reviewed in [employer] record.  ROS  The following are not active complaints unless bolded Hoarseness, sore throat,  dysphagia, dental problems, itching, sneezing,  nasal congestion  perception of discharge of excess mucus or purulent secretions, ear ache,   fever, chills, sweats, unintended wt loss or wt gain, classically pleuritic or exertional cp,  orthopnea pnd or arm/hand swelling  or leg swelling, presyncope, palpitations, abdominal pain, anorexia, nausea, vomiting, diarrhea  or change in bowel habits or change in bladder habits, change in stools or change in urine, dysuria, hematuria,  rash, arthralgias, visual complaints, headache, numbness, weakness or ataxia or problems with walking or coordination,  change in mood or  memory.        Current Meds  Medication Sig   apixaban  (ELIQUIS ) 5 MG TABS tablet Take 1 tablet (5 mg total) by mouth 2 (two) times daily.   carvedilol  (COREG ) 3.125 MG tablet Take 1 tablet (3.125 mg total) by mouth 2 (two) times daily with a meal.   dapagliflozin  propanediol (FARXIGA ) 10 MG TABS tablet Take 1 tablet (10 mg total) by mouth daily. Patient needs a Swanson visit for future refills.   Fluticasone-Umeclidin-Vilant (TRELEGY ELLIPTA ) 100-62.5-25 MCG/ACT AEPB Inhale 1 puff into the lungs daily.   furosemide  (LASIX ) 40 MG tablet Take 40 mg by mouth 2 (two) times daily.   naloxone (NARCAN) nasal spray 4 mg/0.1 mL Place 0.4 mg into the nose once.   oxyCODONE -acetaminophen  (PERCOCET) 10-325 MG tablet Take 1 tablet by mouth every 6 (six) hours as needed for pain.   pantoprazole  (PROTONIX ) 40 MG tablet Take 1 tablet (40 mg total) by mouth daily before breakfast.   potassium chloride  SA (KLOR-CON  M) 20 MEQ tablet Take 1 tablet by mouth once daily   sacubitril -valsartan  (ENTRESTO ) 24-26 MG Take 1 tablet by mouth 2 (two) times daily.   thiamine  (VITAMIN B1) 100 MG tablet Take 1 tablet (100 mg total) by mouth daily.   torsemide  (DEMADEX ) 20 MG tablet Take 1 tablet by mouth once daily          Past Medical History:  Diagnosis Date   Acute pulmonary embolism (HCC) 02/16/2009   Alcohol  abuse    Atrial flutter (HCC)    CHF (congestive heart failure) (HCC)    a. EF 40-45% in 2011 b. EF at 60-65% in 04/2015 c. EF at 45-50% by echo in 09/2020   Dysrhythmia    Hypertension  Lower extremity cellulitis    bilateral   Systolic dysfunction     Objective:    Wts  01/03/2024     254  07/07/2023       244  04/26/2023       254  02/18/2023         248   01/12/23 256 lb (116.1 kg)  01/06/23 258 lb (117 kg)  12/17/22 269 lb 12.8 oz (122.4 kg)     Vital signs reviewed  01/03/2024  - Note at rest 02 sats  94% on RA   General appearance:    pleasant amb bm occ Throat clearing  HEENT : Oropharynx  clear / M3 airway   Nasal turbinates nl    NECK :  without  apparent JVD/ palpable Nodes/TM    LUNGS: no acc muscle use,  Min barrel  contour chest wall with bilateral  Distant bs s audible wheeze and  without cough on insp or exp maneuvers      CV:  RRR  no s3 or murmur or increase in P2, and no edema   ABD:  obese soft and nontender   MS:  Nl gait/ ext warm without deformities Or obvious joint restrictions  calf tenderness, cyanosis or clubbing     SKIN: warm and dry without lesions    NEURO:  alert, approp, nl sensorium with  no motor or cerebellar deficits apparent.            Assessment   Assessment & Plan Asthmatic bronchitis , chronic (HCC) Never regular smoker - PFT's  10/20/22  FEV1 1.16 (32 % ) ratio 0.65 (LLN 0.64 )  p 17 % improvement from saba p 0 prior to study with DLCO  19.9 (71%)   and FV curve mildly concave and ERV 24 at wt 255  (vs baseline 200 most of his life)   - 01/06/2023  > try trelegy 100 one click each am > did not understand meaning of maint rx  - Allergy screen  01/06/23 >  Eos 0.1/  IgE  113 - 02/18/2023  After extensive coaching inhaler device,  effectiveness =    90% > rechallenge with trelegy x 6 week samples then ov  - 04/26/2023 reports no doe with desired activity but very sedentary with mild hypersomnolence and sleeping poorly>  referred to sleep medicine   All goals of chronic asthma control met including optimal function and elimination of symptoms with minimal need for rescue therapy.  Contingencies discussed in full including contacting this Swanson immediately if not controlling the symptoms using the rule of two's.     Upper airway cough syndrome Onset fall 2025 while on trelegy 100 and Entresto    Upper airway cough syndrome (previously labeled PNDS),  is so named because it's frequently impossible to sort out how much is  CR/sinusitis with freq throat clearing (which can be related to primary GERD)   vs  causing  secondary ( extra esophageal)  GERD from wide swings in gastric pressure that occur with throat clearing, often  promoting self use of mint and menthol lozenges that reduce the lower esophageal sphincter tone and exacerbate the problem further in a cyclical fashion.   These are the same pts (now being labeled as having irritable larynx syndrome by some cough centers) who not infrequently have a history of having failed to tolerate ace inhibitors(or the sacubitril  in entresto )   dry powder inhalers or biphosphonates or report having atypical/extraesophageal reflux symptoms from LPR (globus,  throat clearing)  that don't respond to standard doses of PPI  and are easily confused as having aecopd or asthma flares by even experienced allergists/ pulmonologists (myself included).   >>> For now try 1st gen H1 blockers per guidelines if tol for PNDS and hard rock candy but stop the mints   F/u q 72m call sooner prn          Each maintenance medication was reviewed in detail including emphasizing most importantly the difference between maintenance and prns and under what circumstances the prns are to be triggered using an action plan format where appropriate.  Total time for H and P, chart review, counseling, reviewing dpi  device(s) and generating customized AVS unique to this Swanson visit / same day charting  = 25 min          AVS  Patient Instructions  Keep some candy handy to prevent throat clearing    For drainage / throat tickle try take CHLORPHENIRAMINE  4 mg  extremely effective and inexpensive over the counter- may cause drowsiness so start with just a dose or two an hour before bedtime and see how you tolerate it before trying in daytime.          Ozell America, MD 01/03/2024

## 2024-01-08 ENCOUNTER — Other Ambulatory Visit (HOSPITAL_COMMUNITY): Payer: Self-pay | Admitting: Physician Assistant

## 2024-01-17 ENCOUNTER — Other Ambulatory Visit (HOSPITAL_COMMUNITY): Payer: Self-pay | Admitting: Physician Assistant

## 2024-01-17 DIAGNOSIS — I5032 Chronic diastolic (congestive) heart failure: Secondary | ICD-10-CM

## 2024-02-10 ENCOUNTER — Other Ambulatory Visit (HOSPITAL_COMMUNITY): Payer: Self-pay | Admitting: Physician Assistant

## 2024-02-11 ENCOUNTER — Other Ambulatory Visit: Payer: Self-pay | Admitting: Internal Medicine

## 2024-02-11 ENCOUNTER — Other Ambulatory Visit (HOSPITAL_COMMUNITY): Payer: Self-pay | Admitting: Physician Assistant

## 2024-02-23 ENCOUNTER — Encounter: Payer: Self-pay | Admitting: Podiatry

## 2024-02-23 ENCOUNTER — Ambulatory Visit (INDEPENDENT_AMBULATORY_CARE_PROVIDER_SITE_OTHER): Admitting: Podiatry

## 2024-02-23 DIAGNOSIS — B351 Tinea unguium: Secondary | ICD-10-CM | POA: Diagnosis not present

## 2024-02-23 DIAGNOSIS — M79672 Pain in left foot: Secondary | ICD-10-CM | POA: Diagnosis not present

## 2024-02-23 DIAGNOSIS — M79671 Pain in right foot: Secondary | ICD-10-CM | POA: Diagnosis not present

## 2024-02-23 NOTE — Progress Notes (Signed)
 Patient presents for evaluation and treatment of tenderness and some redness around nails feet.  Tenderness around toes with walking and wearing shoes.  Physical exam:  General appearance: Alert, pleasant, and in no acute distress.  Vascular: Pedal pulses: DP 2/4 B/L, PT 0/4 B/L. Mild edema lower legs bilaterally.  Capillary refill time immediate bilaterally  Neurologic:  Dermatologic:  Nails thickened, disfigured, discolored 1-5 BL with subungual debris.  Redness and hypertrophic nail folds along nail folds bilaterally but no signs of drainage or infection.  Musculoskeletal:     Diagnosis: 1. Painful onychomycotic nails 1 through 5 bilaterally. 2. Pain toes 1 through 5 bilaterally.  Plan: -Debrided onychomycotic nails 1 through 5 bilaterally.  Sharply debrided nails with nail clipper and reduced with a power bur.  Return 3 months RFC

## 2024-03-02 ENCOUNTER — Other Ambulatory Visit: Payer: Self-pay | Admitting: Cardiology

## 2024-03-09 ENCOUNTER — Other Ambulatory Visit (HOSPITAL_COMMUNITY): Payer: Self-pay | Admitting: Internal Medicine

## 2024-03-10 ENCOUNTER — Other Ambulatory Visit (HOSPITAL_COMMUNITY): Payer: Self-pay | Admitting: Physician Assistant

## 2024-03-18 ENCOUNTER — Other Ambulatory Visit (HOSPITAL_COMMUNITY): Payer: Self-pay | Admitting: Internal Medicine

## 2024-03-18 DIAGNOSIS — I5020 Unspecified systolic (congestive) heart failure: Secondary | ICD-10-CM

## 2024-04-07 ENCOUNTER — Ambulatory Visit (HOSPITAL_COMMUNITY): Admitting: Internal Medicine

## 2024-05-23 ENCOUNTER — Ambulatory Visit: Admitting: Podiatry
